# Patient Record
Sex: Female | Born: 1950 | Race: White | Hispanic: No | Marital: Single | Smoking: Never smoker
Health system: Southern US, Community
[De-identification: ages and names within clinical notes are randomized; demographics above are authoritative.]

## PROBLEM LIST (undated history)

## (undated) DIAGNOSIS — L930 Discoid lupus erythematosus: Secondary | ICD-10-CM

## (undated) DIAGNOSIS — F71 Moderate intellectual disabilities: Secondary | ICD-10-CM

## (undated) DIAGNOSIS — R918 Other nonspecific abnormal finding of lung field: Secondary | ICD-10-CM

## (undated) DIAGNOSIS — E669 Obesity, unspecified: Secondary | ICD-10-CM

## (undated) DIAGNOSIS — E785 Hyperlipidemia, unspecified: Secondary | ICD-10-CM

## (undated) DIAGNOSIS — I10 Essential (primary) hypertension: Secondary | ICD-10-CM

## (undated) DIAGNOSIS — D729 Disorder of white blood cells, unspecified: Secondary | ICD-10-CM

## (undated) DIAGNOSIS — R569 Unspecified convulsions: Secondary | ICD-10-CM

## (undated) DIAGNOSIS — Z780 Asymptomatic menopausal state: Secondary | ICD-10-CM

## (undated) HISTORY — DX: Essential (primary) hypertension: I10

## (undated) HISTORY — DX: Unspecified convulsions: R56.9

## (undated) HISTORY — DX: Discoid lupus erythematosus: L93.0

## (undated) HISTORY — DX: Disorder of white blood cells, unspecified: D72.9

## (undated) HISTORY — DX: Asymptomatic menopausal state: Z78.0

## (undated) HISTORY — DX: Other nonspecific abnormal finding of lung field: R91.8

## (undated) HISTORY — DX: Obesity, unspecified: E66.9

## (undated) HISTORY — DX: Hyperlipidemia, unspecified: E78.5

## (undated) HISTORY — DX: Moderate intellectual disabilities: F71

## (undated) HISTORY — PX: VESICOVAGINAL FISTULA CLOSURE W/ TAH: SUR271

---

## 2000-09-10 ENCOUNTER — Encounter: Payer: Self-pay | Admitting: Family Medicine

## 2000-09-10 ENCOUNTER — Encounter: Admission: RE | Admit: 2000-09-10 | Discharge: 2000-09-10 | Payer: Self-pay | Admitting: Family Medicine

## 2001-08-19 ENCOUNTER — Emergency Department (HOSPITAL_COMMUNITY): Admission: EM | Admit: 2001-08-19 | Discharge: 2001-08-19 | Payer: Self-pay | Admitting: Emergency Medicine

## 2001-09-16 ENCOUNTER — Encounter: Admission: RE | Admit: 2001-09-16 | Discharge: 2001-09-16 | Payer: Self-pay | Admitting: Family Medicine

## 2001-09-16 ENCOUNTER — Encounter: Payer: Self-pay | Admitting: Family Medicine

## 2002-09-22 ENCOUNTER — Encounter: Admission: RE | Admit: 2002-09-22 | Discharge: 2002-09-22 | Payer: Self-pay | Admitting: Family Medicine

## 2002-09-22 ENCOUNTER — Encounter: Payer: Self-pay | Admitting: Family Medicine

## 2003-10-12 ENCOUNTER — Encounter: Admission: RE | Admit: 2003-10-12 | Discharge: 2003-10-12 | Payer: Self-pay | Admitting: Family Medicine

## 2004-07-08 ENCOUNTER — Emergency Department (HOSPITAL_COMMUNITY): Admission: EM | Admit: 2004-07-08 | Discharge: 2004-07-08 | Payer: Self-pay | Admitting: Emergency Medicine

## 2004-11-11 ENCOUNTER — Encounter: Admission: RE | Admit: 2004-11-11 | Discharge: 2004-11-11 | Payer: Self-pay | Admitting: Family Medicine

## 2005-06-28 ENCOUNTER — Ambulatory Visit (HOSPITAL_COMMUNITY): Admission: RE | Admit: 2005-06-28 | Discharge: 2005-06-28 | Payer: Self-pay | Admitting: Neurology

## 2005-12-06 ENCOUNTER — Encounter: Admission: RE | Admit: 2005-12-06 | Discharge: 2005-12-06 | Payer: Self-pay | Admitting: Family Medicine

## 2006-12-21 ENCOUNTER — Encounter: Admission: RE | Admit: 2006-12-21 | Discharge: 2006-12-21 | Payer: Self-pay | Admitting: Family Medicine

## 2007-03-08 ENCOUNTER — Ambulatory Visit (HOSPITAL_COMMUNITY): Admission: RE | Admit: 2007-03-08 | Discharge: 2007-03-08 | Payer: Self-pay | Admitting: Neurology

## 2007-11-09 ENCOUNTER — Emergency Department (HOSPITAL_COMMUNITY): Admission: EM | Admit: 2007-11-09 | Discharge: 2007-11-09 | Payer: Self-pay | Admitting: Family Medicine

## 2008-01-01 ENCOUNTER — Encounter: Admission: RE | Admit: 2008-01-01 | Discharge: 2008-01-01 | Payer: Self-pay | Admitting: Family Medicine

## 2008-03-18 ENCOUNTER — Encounter: Payer: Self-pay | Admitting: Internal Medicine

## 2008-05-13 ENCOUNTER — Encounter: Payer: Self-pay | Admitting: Internal Medicine

## 2008-05-22 ENCOUNTER — Ambulatory Visit (HOSPITAL_COMMUNITY): Admission: RE | Admit: 2008-05-22 | Discharge: 2008-05-22 | Payer: Self-pay | Admitting: Neurology

## 2008-06-11 ENCOUNTER — Encounter: Payer: Self-pay | Admitting: Internal Medicine

## 2008-07-17 ENCOUNTER — Ambulatory Visit: Payer: Self-pay | Admitting: Internal Medicine

## 2008-07-17 DIAGNOSIS — R05 Cough: Secondary | ICD-10-CM

## 2008-07-17 DIAGNOSIS — R599 Enlarged lymph nodes, unspecified: Secondary | ICD-10-CM | POA: Insufficient documentation

## 2008-07-17 DIAGNOSIS — J309 Allergic rhinitis, unspecified: Secondary | ICD-10-CM

## 2008-07-17 DIAGNOSIS — J984 Other disorders of lung: Secondary | ICD-10-CM | POA: Insufficient documentation

## 2008-07-21 ENCOUNTER — Telehealth (INDEPENDENT_AMBULATORY_CARE_PROVIDER_SITE_OTHER): Payer: Self-pay | Admitting: *Deleted

## 2008-07-22 ENCOUNTER — Telehealth: Payer: Self-pay | Admitting: Internal Medicine

## 2008-07-30 ENCOUNTER — Telehealth: Payer: Self-pay | Admitting: Internal Medicine

## 2008-07-31 ENCOUNTER — Encounter: Payer: Self-pay | Admitting: Internal Medicine

## 2008-08-21 ENCOUNTER — Ambulatory Visit: Payer: Self-pay | Admitting: Internal Medicine

## 2008-08-24 ENCOUNTER — Telehealth (INDEPENDENT_AMBULATORY_CARE_PROVIDER_SITE_OTHER): Payer: Self-pay

## 2008-09-04 ENCOUNTER — Encounter: Payer: Self-pay | Admitting: Internal Medicine

## 2008-09-24 ENCOUNTER — Ambulatory Visit: Payer: Self-pay | Admitting: Critical Care Medicine

## 2008-12-21 ENCOUNTER — Ambulatory Visit: Payer: Self-pay | Admitting: Internal Medicine

## 2008-12-21 LAB — CONVERTED CEMR LAB
BUN: 7 mg/dL (ref 6–23)
Chloride: 96 meq/L (ref 96–112)
Creatinine, Ser: 0.5 mg/dL (ref 0.4–1.2)
GFR calc non Af Amer: 134 mL/min
Glucose, Bld: 90 mg/dL (ref 70–99)
Potassium: 4.3 meq/L (ref 3.5–5.1)
Sodium: 130 meq/L — ABNORMAL LOW (ref 135–145)

## 2008-12-24 ENCOUNTER — Encounter: Payer: Self-pay | Admitting: Internal Medicine

## 2009-01-12 ENCOUNTER — Encounter: Admission: RE | Admit: 2009-01-12 | Discharge: 2009-01-12 | Payer: Self-pay | Admitting: Family Medicine

## 2009-01-13 ENCOUNTER — Encounter: Payer: Self-pay | Admitting: Internal Medicine

## 2009-02-03 ENCOUNTER — Ambulatory Visit: Payer: Self-pay | Admitting: Internal Medicine

## 2009-02-05 ENCOUNTER — Telehealth: Payer: Self-pay | Admitting: Internal Medicine

## 2009-02-05 ENCOUNTER — Ambulatory Visit: Payer: Self-pay | Admitting: Internal Medicine

## 2009-02-12 ENCOUNTER — Ambulatory Visit (HOSPITAL_COMMUNITY): Admission: RE | Admit: 2009-02-12 | Discharge: 2009-02-12 | Payer: Self-pay | Admitting: Internal Medicine

## 2009-02-15 ENCOUNTER — Telehealth: Payer: Self-pay | Admitting: Internal Medicine

## 2009-03-19 ENCOUNTER — Encounter: Payer: Self-pay | Admitting: Internal Medicine

## 2009-05-21 ENCOUNTER — Ambulatory Visit: Payer: Self-pay | Admitting: Internal Medicine

## 2009-05-21 LAB — CONVERTED CEMR LAB
Calcium: 8.7 mg/dL (ref 8.4–10.5)
Chloride: 96 meq/L (ref 96–112)
Creatinine, Ser: 0.5 mg/dL (ref 0.4–1.2)
GFR calc non Af Amer: 133.93 mL/min (ref >60)
Glucose, Bld: 90 mg/dL (ref 70–99)
Sodium: 131 meq/L — ABNORMAL LOW (ref 135–145)

## 2009-05-26 ENCOUNTER — Ambulatory Visit: Payer: Self-pay | Admitting: Internal Medicine

## 2009-05-27 ENCOUNTER — Encounter: Payer: Self-pay | Admitting: Internal Medicine

## 2009-06-02 ENCOUNTER — Encounter: Payer: Self-pay | Admitting: Internal Medicine

## 2009-06-02 ENCOUNTER — Telehealth (INDEPENDENT_AMBULATORY_CARE_PROVIDER_SITE_OTHER): Payer: Self-pay | Admitting: *Deleted

## 2009-06-14 ENCOUNTER — Telehealth (INDEPENDENT_AMBULATORY_CARE_PROVIDER_SITE_OTHER): Payer: Self-pay | Admitting: *Deleted

## 2009-08-10 ENCOUNTER — Telehealth (INDEPENDENT_AMBULATORY_CARE_PROVIDER_SITE_OTHER): Payer: Self-pay | Admitting: *Deleted

## 2009-10-06 ENCOUNTER — Encounter: Payer: Self-pay | Admitting: Internal Medicine

## 2009-11-17 ENCOUNTER — Ambulatory Visit: Payer: Self-pay | Admitting: Cardiology

## 2009-11-19 ENCOUNTER — Ambulatory Visit: Payer: Self-pay | Admitting: Internal Medicine

## 2009-11-23 ENCOUNTER — Telehealth: Payer: Self-pay | Admitting: Internal Medicine

## 2010-01-04 ENCOUNTER — Ambulatory Visit: Payer: Self-pay | Admitting: Internal Medicine

## 2010-01-04 DIAGNOSIS — L659 Nonscarring hair loss, unspecified: Secondary | ICD-10-CM | POA: Insufficient documentation

## 2010-01-06 ENCOUNTER — Telehealth (INDEPENDENT_AMBULATORY_CARE_PROVIDER_SITE_OTHER): Payer: Self-pay | Admitting: *Deleted

## 2010-01-07 LAB — CONVERTED CEMR LAB
T3, Free: 2.3 pg/mL (ref 2.3–4.2)
T4, Total: 6 ug/dL (ref 5.0–12.5)
TSH: 3.6 microintl units/mL (ref 0.35–5.50)

## 2010-01-18 ENCOUNTER — Encounter: Admission: RE | Admit: 2010-01-18 | Discharge: 2010-01-18 | Payer: Self-pay | Admitting: Family Medicine

## 2010-01-19 ENCOUNTER — Encounter: Admission: RE | Admit: 2010-01-19 | Discharge: 2010-04-19 | Payer: Self-pay | Admitting: Internal Medicine

## 2010-01-28 ENCOUNTER — Encounter: Admission: RE | Admit: 2010-01-28 | Discharge: 2010-01-28 | Payer: Self-pay | Admitting: Family Medicine

## 2010-02-24 ENCOUNTER — Encounter: Payer: Self-pay | Admitting: Internal Medicine

## 2010-06-27 ENCOUNTER — Ambulatory Visit: Payer: Self-pay | Admitting: Internal Medicine

## 2010-06-27 LAB — CONVERTED CEMR LAB
CO2: 27 meq/L (ref 19–32)
Chloride: 92 meq/L — ABNORMAL LOW (ref 96–112)
GFR calc non Af Amer: 150.69 mL/min (ref >60)

## 2010-07-01 ENCOUNTER — Telehealth (INDEPENDENT_AMBULATORY_CARE_PROVIDER_SITE_OTHER): Payer: Self-pay | Admitting: *Deleted

## 2010-07-05 ENCOUNTER — Encounter: Payer: Self-pay | Admitting: Internal Medicine

## 2010-07-06 ENCOUNTER — Telehealth: Payer: Self-pay | Admitting: Internal Medicine

## 2010-07-06 ENCOUNTER — Ambulatory Visit: Payer: Self-pay | Admitting: Cardiovascular Disease

## 2010-07-13 ENCOUNTER — Ambulatory Visit: Payer: Self-pay | Admitting: Internal Medicine

## 2010-08-17 ENCOUNTER — Ambulatory Visit: Payer: Self-pay | Admitting: Internal Medicine

## 2010-11-02 ENCOUNTER — Encounter (INDEPENDENT_AMBULATORY_CARE_PROVIDER_SITE_OTHER): Payer: Self-pay | Admitting: *Deleted

## 2010-11-18 ENCOUNTER — Ambulatory Visit: Payer: Self-pay | Admitting: Internal Medicine

## 2010-12-22 ENCOUNTER — Encounter: Payer: Self-pay | Admitting: Internal Medicine

## 2010-12-24 ENCOUNTER — Other Ambulatory Visit: Payer: Self-pay | Admitting: Family Medicine

## 2010-12-24 DIAGNOSIS — Z1239 Encounter for other screening for malignant neoplasm of breast: Secondary | ICD-10-CM

## 2010-12-26 ENCOUNTER — Encounter: Payer: Self-pay | Admitting: Internal Medicine

## 2011-01-03 NOTE — Progress Notes (Signed)
Summary: RETURNED CALL  Phone Note Call from Patient   Caller: PT'S SISTER CYBIL BRAY Call For: [facility] Summary of Call: RETURNED CALL RE: PT. CALL CYBIL BRAY [phone]. Initial call taken by: Tivis Ringer, CNA,  July 01, 2010 12:34 PM  Follow-up for Phone Call        Spoke with pt's sister Cybil.  I advised that per MR- pt's sodium is low.  She states that pt is doing fine and having no current issues.  I advised that we have already let Dr Rosezetta Schlatter know about this issue and she should followup there.  Sister verbalized understanding.   Follow-up by: Vernie Murders,  July 01, 2010 2:30 PM

## 2011-01-03 NOTE — Miscellaneous (Signed)
Summary: Discharged/[place]  Discharged/[place]   Imported By: Lester Springbrook 06/14/2010 09:52:44  _____________________________________________________________________  External Attachment:    Type:   Image     Comment:   External Document

## 2011-01-03 NOTE — Letter (Signed)
Summary: Physicians Recommendations/[facility]  Physicians Recommendations/[facility]   Imported By: Lennie Odor 08/19/2010 14:16:23  _____________________________________________________________________  External Attachment:    Type:   Image     Comment:   External Document

## 2011-01-03 NOTE — Assessment & Plan Note (Signed)
Summary: 4-5 weeks/apc   Visit Type:  Follow-up Copy to:  Dr. Stevphen Rochester Primary Provider/Referring Provider:  Dr. Fuller Mandril  CC:  Pt here for follow-up. Pt states neurotin has helped cough alot. Marland Kitchen  History of Present Illness: :  Followup Chronic Cough (since 2007)   OV 07/13/2010: Presents with careatker CArla (she is one on one iwht patient for years) . Mom not present today. Last visit in FEb 2011. AT that time they other caretaker [name] and mom reported cough was 90% better on cylical cough protocol and coming off alendronate. TOday, other caretaker [name] tells me that cyclical cough protocol never helped. Cough is unchanged and persistent. She describes it as intense episodes that come on at random and lasts 90 seconds. It is dry. No clear cut precipitating or aggravating factors.  No cough at night. Still with alopecia. REC: NEURONTIN FOR CYCLICAL COUGH   OV August 17, 2010: tAking neurontin 100mg  by mouth three times a day . Cough now has improved over 70% according to caretaker. Caretaker also ffeels behavior is better. No side effects from neurontin. Patient also agrees cough is tremondously better. Denies associated nausea, vomit, diarrhea, fever, chills, edema, dyspnea, wheeze, sinus drainage, gerd     Preventive Screening-Counseling & Management  Alcohol-Tobacco     Smoking Status: never     Passive Smoke Exposure: no  Current Medications (verified): 1)  Tegretol Xr 400 Mg  Xr12h-Tab (Carbamazepine) .Marland Kitchen.. 1 in The Morning and 2 At Bedtime 2)  Cerovite Advanced Formula   Tabs (Multiple Vitamins-Minerals) .... Once Daily 3)  Benefiber   Chew (Wheat Dextrin) .... Two Times A Day 4)  Keppra 500 Mg  Tabs (Levetiracetam) .... Two Times A Day 5)  Buspirone Hcl 15 Mg  Tabs (Buspirone Hcl) .... Take 1 Tab Two Times A Day 6)  Calcium Carbonate 600 Mg Tabs (Calcium Carbonate) .... Three Times A Day 7)  Vicks Vapor Rub .... As Directed 8)  Cvs Ibuprofen 200 Mg  Caps  (Ibuprofen) .... 2 Tabs Every 6 Hours 9)  Dexilant 60 Mg Cpdr (Dexlansoprazole) .... Once Daily 10)  Vitamin D 400 Iu .Marland Kitchen.. 1 Tab 3 Times A Day 11)  Selenium 200 Mcg Tabs (Selenium) .... Take 1 Tablet By Mouth Once A Day 12)  Elidel 1 % Crea (Pimecrolimus) .... As Directed 13)  Benzonatate 100 Mg Caps (Benzonatate) .... Take 1 Tablet By Mouth Three Times A Day As Needed 14)  Neurontin 100 Mg Caps (Gabapentin) .... Take 1 Capsule By Mouth Three Times A Day  Allergies (verified): No Known Drug Allergies  Past History:  Past medical, surgical, family and social histories (including risk factors) reviewed, and no changes noted (except as noted below).  Past Medical History: Allergic Rhinitis Obesity Menopause Denies MI, COPD, Asthma, Hyperlipidemia, Hypertension.  Moderate Mental Retardation Seizures - focal - being followed by Dr. Sandria Manly (shuld not take anti-histamines) Discoid lupus  Alopecia Low chronic WBC coung (2-3K) Hyperlipidemia Osteoporosis.  Pulmonary Nodules  - 6mm 3/.33/2010. No change CT 07/06/2010. No further followup Mediastinal node  - stable since 2010 through 07/2010. NO furthre followuo  Past Surgical History: Reviewed history from 07/17/2008 and no changes required. hysterectomy  Family History: Reviewed history from 07/17/2008 and no changes required. cancer-breast -mother  Social History: Reviewed history from 07/13/2010 and no changes required. 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   Review of Systems  The patient denies shortness of  breath with activity, shortness of breath at rest, productive cough, non-productive cough, coughing up blood, chest pain, irregular heartbeats, acid heartburn, indigestion, loss of appetite, weight change, abdominal pain, difficulty swallowing, sore throat, tooth/dental problems, headaches, nasal congestion/difficulty breathing through nose, sneezing,  itching, ear ache, anxiety, depression, hand/feet swelling, joint stiffness or pain, rash, change in color of mucus, and fever.    Vital Signs:  Patient profile:   60 year old female Height:      65 inches Weight:      181 pounds BMI:     30.23 O2 Sat:      97 % on Room air Temp:     97.5 degrees F oral Pulse rate:   81 / minute BP sitting:   110 / 72  (right arm) Cuff size:   regular  Vitals Entered By: Carron Curie CMA (August 17, 2010 1:41 PM)  O2 Flow:  Room air CC: Pt here for follow-up. Pt states neurotin has helped cough alot.  Comments Medications reviewed with patient Carron Curie CMA  August 17, 2010 1:45 PM Daytime phone number verified with patient.    Physical Exam  General:  oveerweight alopecia + Head:  normocephalic and atraumatic Eyes:  PERRLA/EOM intact; conjunctiva and sclera clear Ears:  TMs intact and clear with normal canals Nose:  no deformity, discharge, inflammation, or lesions Mouth:  no deformity or lesionsMelampatti Class II.   Neck:  no masses, thyromegaly, or abnormal cervical nodes Chest Wall:  no deformities noted Lungs:  clear bilaterally to auscultation and percussion Heart:  regular rate and rhythm, S1, S2 without murmurs, rubs, gallops, or clicks Abdomen:  bowel sounds positive; abdomen soft and non-tender without masses, or organomegaly Msk:  no deformity or scoliosis noted with normal posture Pulses:  pulses normal Extremities:  no clubbing, cyanosis, edema, or deformity noted Neurologic:  CN II-XII grossly intact with normal reflexes, coordination, muscle strength and tone Skin:  intact without lesions or rashes Cervical Nodes:  no significant adenopathy Axillary Nodes:  no significant adenopathy Psych:  alert and cooperative; normal mood and affect; normal attention span and concentration   Impression & Recommendations:  Problem # 1:  COUGH (ICD-786.2) Assessment Improved  Orders: Speech Therapy (Speech  Therapy)  Tough situation. Cough is likely multifactorial. THere is hx of sinus drainage but she has failed nasal inhlaer Rx. They wont try antihistamine or sudafed to hx of seizures. She is currently on GERD Rx and GI followup but this has not helped. Although she continues to take ALENDRONATE which could be contributing to GERD. HAs failed asthma Rx with symbicort/singulair in past. No evidence of fibrosis on CT chest (multiple). There are lot of features to suggest HABIT COUGH v VCD  Followup 01/04/2010: cough had improved with cyclical cough protocol, stopping alendronate and tessalon perles. This suggests signfiicant VCD  Follouwp 07/13/2010: cough unimproved. START NEURONTIN (okayed by Dr. Sandria Manly)  Followup 08/17/2010: Cough improved 70% with neurontin 100mg  by mouth three times a day   PLAN Increase neurontin gradually to 300mg  by mouth three times a day (tolerateing 100mg  three times a day well so far) FU 3 months  Orders: Est. Patient Level II (16109)  Medications Added to Medication List This Visit: 1)  Neurontin 100 Mg Caps (Gabapentin) .... Take 1 capsule by mouth three times a day 2)  Neurontin 300 Mg Caps (Gabapentin) .... Take 1 cap two times a day x 3 days, then 1 cap three times a day to continue  Patient Instructions:  1)  increase your neurontin as directed  2)  have flu shot today 3)  return to see me in 3-4 months 4)  come sooner if you are feeling bad or having side effects from neurontin Prescriptions: NEURONTIN 300 MG CAPS (GABAPENTIN) take 1 cap two times a day x 3 days, then 1 cap three times a day to continue  #90 x 6   Entered and Authorized by:   Kalman Shan MD   Signed by:   Kalman Shan MD on 08/17/2010   Method used:   Print then Give to Patient   RxID:   [id number]    Immunization History:  Pneumovax Immunization History:    Pneumovax:  pneumovax (06/03/2010)   Appended Document: 4-5 weeks/apc    Clinical Lists  Changes  Orders: Added new Service order of Influenza Vaccine MCR [id number]) - Signed Observations: Added new observation of FLU VAXLOT: [id number] (08/17/2010 14:10) Added new observation of FLU VAX EXP: 06/03/2011 (08/17/2010 14:10) Added new observation of FLU VAXBY: Michel Bickers CMA (08/17/2010 14:10) Added new observation of FLU VAXRTE: IM (08/17/2010 14:10) Added new observation of FLU VAX DSE: 0.5 ml (08/17/2010 14:10) Added new observation of FLU VAXMFR: GlaxoSmithKline (08/17/2010 14:10) Added new observation of FLU VAX SITE: right deltoid (08/17/2010 14:10) Added new observation of FLU VAX: Fluvax MCR (08/17/2010 14:10)       Immunizations Administered:  Influenza Vaccine # 1:    Vaccine Type: Fluvax MCR    Site: right deltoid    Mfr: GlaxoSmithKline    Dose: 0.5 ml    Route: IM    Given by: Michel Bickers CMA    Exp. Date: 06/03/2011    Lot #: [id number]  Flu Vaccine Consent Questions:    Do you have a history of severe allergic reactions to this vaccine? no    Any prior history of allergic reactions to egg and/or gelatin? no    Do you have a sensitivity to the preservative Thimersol? no    Do you have a past history of Guillan-Barre Syndrome? no    Do you currently have an acute febrile illness? no    Have you ever had a severe reaction to latex? no    Vaccine information given and explained to patient? yes    Are you currently pregnant? no

## 2011-01-03 NOTE — Progress Notes (Signed)
Summary: results  Phone Note Call from Patient Call back at Home Phone [phone]   Caller: Mom Call For: ramaswamy Reason for Call: Talk to Nurse, Lab or Test Results Summary of Call: want results of test done on her daughter. Initial call taken by: Eugene Gavia,  January 06, 2010 11:24 AM  Follow-up for Phone Call        Lab results given to pt's Mom. Abigail Miyamoto RN  January 06, 2010 11:44 AM

## 2011-01-03 NOTE — Progress Notes (Signed)
Summary: results  Phone Note Call from Patient   Caller: Katherine Mantle - Group home manager Call For: ramaswamy Reason for Call: Talk to Nurse Summary of Call: pt in group home.  Please send results of her blood test to home fax. Initial call taken by: Eugene Gavia,  July 06, 2010 2:50 PM  Follow-up for Phone Call        Spoke with group home manager Katherine Mantle and advised of pt's sodium levels and MR recs. Pt's PCP is Ace Gins at [facility] on [address]. Pt's mother is in the hospital. Caretaker is requesting copy of labs. Labs placed in mail and labs faxed to Dr. Larina Bras office. Zackery Barefoot CMA  July 06, 2010 3:12 PM

## 2011-01-03 NOTE — Progress Notes (Signed)
Summary: Physical Form/[facility]  Physical Form/[facility]   Imported By: Sherian Rein 07/20/2010 14:25:11  _____________________________________________________________________  External Attachment:    Type:   Image     Comment:   External Document

## 2011-01-03 NOTE — Assessment & Plan Note (Signed)
Summary: rov ///kp   Visit Type:  Follow-up Copy to:  Dr. Stevphen Rochester Primary Provider/Referring Provider:  Dr. Fuller Mandril  CC:  Pt here for follow-up appt. pt states she still has the cough. It was better during cyclical cough protocol.  Pt states she does not cough when sh eis lying flat. Pt request thyoid check..  History of Present Illness: OV 01/04/2010. Followup Chronic Cough (since 2007), Pulmonary nodules and Mediastinal Nodes.  Caretaker [name] from Group Home with her and mom. At last visit in December, we started cylical cough protocol for HABIT COUGH. They state with the protoicol, cough resolved 90% but 10 days later cough recurred but still it is only at 50% of prior severity. THey also think stopping alendronate has helped cough. Tessalon perles also helped cough. They are relieved it is better but disappointed it is not gone yet. Mom also concerned about new alopecia on scalp. Seen by derm but no relief. She is specifically requesting thyroid function testing.   Current Medications (verified): 1)  Tegretol Xr 400 Mg  Xr12h-Tab (Carbamazepine) .Marland Kitchen.. 1 in The Morning and 2 At Bedtime 2)  Cerovite Advanced Formula   Tabs (Multiple Vitamins-Minerals) .... Once Daily 3)  Benefiber   Chew (Wheat Dextrin) .... Once Daily 4)  Keppra 500 Mg  Tabs (Levetiracetam) .... Two Times A Day 5)  Buspirone Hcl 15 Mg  Tabs (Buspirone Hcl) .... Take 1 Tab Two Times A Day 6)  Calcium Carbonate 600 Mg Tabs (Calcium Carbonate) .... Three Times A Day 7)  Vicks Vapor Rub .... As Directed 8)  Cvs Ibuprofen 200 Mg  Caps (Ibuprofen) .... 2 Tabs Every 6 Hours 9)  Dexilant 60 Mg Cpdr (Dexlansoprazole) .... Once Daily 10)  Vitamin D 400 Iu .Marland Kitchen.. 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 Allergic Rhinitis Obesity Menopause Denies MI, COPD, Asthma, Hyperlipidemia, Hypertension.  Moderate Mental Retardation Seizures - focal - being followed by Dr. Sandria Manly (shuld not take anti-histamines)  Past Surgical History: Reviewed history from 07/17/2008 and no changes required. hysterectomy  Family History: Reviewed history from 07/17/2008 and no changes required. cancer-breast -mother  Social History: Reviewed history from 07/17/2008 and no changes required. 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  The patient denies anorexia, fever, weight loss, weight gain, vision loss, decreased hearing, hoarseness, chest pain, syncope, dyspnea on exertion, peripheral edema, headaches, hemoptysis, abdominal pain, melena, hematochezia, severe indigestion/heartburn, hematuria, incontinence, genital sores, muscle weakness, suspicious skin lesions, transient blindness, difficulty walking, depression, unusual weight change, abnormal bleeding, enlarged lymph nodes, angioedema, breast masses, and testicular masses.         alopecia  Vital Signs:  Patient profile:   60 year old female Height:      65 inches Weight:      183.13 pounds O2 Sat:      98 % on Room air Temp:     97.5 degrees F oral Pulse rate:   76 / minute BP sitting:   138 / 90  (right  arm) Cuff size:   regular  Vitals Entered By: Carron Curie CMA (January 04, 2010 4:03 PM)  O2 Flow:  Room air CC: Pt here for follow-up appt. pt states she still has the cough. It was better during cyclical cough protocol.  Pt states she does not cough when sh eis lying flat. Pt request thyoid check. Comments Medications  reviewed with patient Carron Curie CMA  January 04, 2010 4:03 PM Daytime phone number verified with patient.    Physical Exam  General:  oveerweight Head:  normocephalic and atraumatic Eyes:  PERRLA/EOM intact; conjunctiva and sclera clear Ears:  TMs intact and clear with normal canals Nose:  no deformity, discharge, inflammation, or lesions Mouth:  no deformity or lesionsMelampatti Class II.   Neck:  no masses, thyromegaly, or abnormal cervical nodes Chest Wall:  no deformities noted Lungs:  clear bilaterally to auscultation and percussion Heart:  regular rate and rhythm, S1, S2 without murmurs, rubs, gallops, or clicks Abdomen:  bowel sounds positive; abdomen soft and non-tender without masses, or organomegaly Msk:  no deformity or scoliosis noted with normal posture Pulses:  pulses normal Extremities:  no clubbing, cyanosis, edema, or deformity noted Neurologic:  CN II-XII grossly intact with normal reflexes, coordination, muscle strength and tone Skin:  intact without lesions or rashes Cervical Nodes:  no significant adenopathy Axillary Nodes:  no significant adenopathy Psych:  alert and cooperative; normal mood and affect; normal attention span and concentration   Impression & Recommendations:  Problem # 1:  ALOPECIA (ICD-704.00) Assessment New per mom;s request check thyroid function test Orders: TLB-T4 (Thyrox), Total (84436-T4) TLB-T3, Free (Triiodothyronine) (84481-T3FREE) TLB-TSH (Thyroid Stimulating Hormone) (84443-TSH) Est. Patient Level III (29528)  Problem # 2:  ENLARGEMENT OF LYMPH NODES (ICD-785.6) Assessment: Comment Only  Improved v stable. Difficult to say in non contrast ct chest  plan repeat ct chest with contrast Fall 2011 Orders: Radiology Referral (Radiology)  Problem # 3:  PULMONARY NODULE (ICD-518.89) Assessment: Comment Only  Orders: Radiology Referral (Radiology) Est. Patient Level III (41324)  Orders: Radiology Referral  (Radiology)  largest nodule is 6mm on 02/03/2009.  Seems stable per report from 06/2008 [facility].  No change in CT June/July 2010 No chnage in CT 12/`04/2009  plan necxt cct in 6-9 months - august 2011 (final CT)  Problem # 4:  COUGH (ICD-786.2) Assessment: Improved  Orders: Speech Therapy (Speech Therapy) Est. Patient Level III (40102)  Tough situation. Cough is likely multifactorial. THere is hx of sinus drainage but she has failed nasal inhlaer Rx. They wont try antihistamine or sudafed to hx of seizures. She is currently on GERD Rx and GI followup but this has not helped. Although she continues to take ALENDRONATE which could be contributing to GERD. HAs failed asthma Rx with symbicort/singulair in past. No evidence of fibrosis on CT chest (multiple). There are lot of features to suggest HABIT COUGH v VCD  Followup 01/04/2010: cough had improved with cyclical cough protocol, stopping alendronate and tessalon perles. This suggests signfiicant VCD  PLAN REFER TO SPEECH THERAPY INSTRUCTED TO SWALLOW OR DRINK WATER when she wants to cough CONTINUE PPI ROV 6 months Depending on performance to above, will consider diagnostic lary with triggers Vs 2nd opinoon from [facility] referral  Medications Added to Medication List This Visit: 1)  Tessalon Perles 100 Mg Caps (Benzonatate) .... One to  mouth 3 times daily as needed  Patient Instructions: 1)  please attend speech therapy 2)  take teassalon cough perles  for cough 3)  continue your other medicines 4)  change your ct scan date to august 2011 5)  return to see me after ct chest Prescriptions: TESSALON PERLES 100 MG  CAPS (BENZONATATE) One to  mouth 3 times daily as needed  #90 x 6   Entered and Authorized by:   Kalman Shan MD   Signed by:   Kalman Shan MD on 01/04/2010   Method used:   Print then Give to Patient   RxID:   [id number]

## 2011-01-03 NOTE — Letter (Signed)
Summary: [facility] Results Follow Up Letter  [facility]  [address]   [place], [place] [address]   Phone: [phone]  Fax: [phone]    07/05/2010 MRN: [id number]  [facility] [address] [place], [place]  [address]  Dear Ms. Rosiak,  We have received the results from your recent tests and have been unable to contact you.  Please call our office at [phone] so that Dr. Marchelle Gearing or his nurse may review the results with you.    Thank you,  Nature conservation officer Pulmonary Division

## 2011-01-03 NOTE — Letter (Signed)
Summary: Physicians Recommendations/[facility]  Physicians Recommendations/[facility]   Imported By: Sherian Rein 01/11/2010 11:24:36  _____________________________________________________________________  External Attachment:    Type:   Image     Comment:   External Document

## 2011-01-03 NOTE — Assessment & Plan Note (Signed)
Summary: follow up ct chest/cb   Visit Type:  Follow-up Copy to:  Dr. Stevphen Rochester Primary Nicole [name]/Referring Veer Elamin:  Dr. Fuller Mandril  CC:  Pt here for follow up.  History of Present Illness: :  Followup Chronic Cough (since 2007), Pulmonary nodules and Mediastinal Nodes.  Caretaker Nicole [name] from  OV 07/13/2010: Presents with careatker Nicole [name] (she is one on one iwht patient for years) . Mom not present today. Last visit in FEb 2011. AT that time they other caretaker Nicole [name] and mom reported cough was 90% better on cylical cough protocol and coming off alendronate. TOday, other caretaker Nicole [name] tells me that cyclical cough protocol never helped. Cough is unchanged and persistent. She describes it as intense episodes that come on at random and lasts 90 seconds. It is dry. No clear cut precipitating or aggravating factors.  No cough at night. Still with alopecia.     Preventive Screening-Counseling & Management  Alcohol-Tobacco     Smoking Status: never  Current Medications (verified): 1)  Tegretol Xr 400 Mg  Xr12h-Tab (Carbamazepine) .Marland Kitchen.. 1 in The Morning and 2 At Bedtime 2)  Cerovite Advanced Formula   Tabs (Multiple Vitamins-Minerals) .... Once Daily 3)  Benefiber   Chew (Wheat Dextrin) .... Two Times A Day 4)  Keppra 500 Mg  Tabs (Levetiracetam) .... Two Times A Day 5)  Buspirone Hcl 15 Mg  Tabs (Buspirone Hcl) .... Take 1 Tab Two Times A Day 6)  Calcium Carbonate 600 Mg Tabs (Calcium Carbonate) .... Three Times A Day 7)  Vicks Vapor Rub .... As Directed 8)  Cvs Ibuprofen 200 Mg  Caps (Ibuprofen) .... 2 Tabs Every 6 Hours 9)  Dexilant 60 Mg Cpdr (Dexlansoprazole) .... Once Daily 10)  Vitamin D 400 Iu .Marland Kitchen.. 1 Tab 3 Times A Day 11)  Selenium 200 Mcg Tabs (Selenium) .... Take 1 Tablet By Mouth Once A Day 12)  Elidel 1 % Crea (Pimecrolimus) .... As Directed 13)  Benzonatate 100 Mg Caps (Benzonatate) .... 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   Risk Factors: Smoking Status: never (07/13/2010) Passive Smoke Exposure: no (07/17/2008)  Past Medical History: Allergic Rhinitis Obesity Menopause Denies MI, COPD, Asthma, Hyperlipidemia, Hypertension.  Moderate Mental Retardation Seizures - focal - being followed by Dr. Sandria Manly (shuld not take anti-histamines) Discoid lupus  Alopecia Low chronic WBC coung (2-3K) Hyperlipidemia Osteoporosis.   Past Surgical History: Reviewed history from 07/17/2008 and no changes required. hysterectomy  Family History: Reviewed history from 07/17/2008 and no changes required. cancer-breast -mother  Social History: Reviewed history from 07/17/2008 and no changes required. 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   Review of Systems  The patient denies anorexia, fever, weight loss, weight gain, vision loss, decreased hearing, hoarseness, chest pain, syncope, dyspnea on exertion, peripheral edema, prolonged cough, headaches, hemoptysis, abdominal pain, melena, hematochezia, severe indigestion/heartburn, hematuria, incontinence, genital sores, muscle weakness, suspicious skin lesions, transient blindness, difficulty walking, depression, unusual weight change, abnormal bleeding, enlarged lymph nodes, angioedema, breast masses, and testicular masses.         alopecia chronic cough  Vital Signs:  Patient profile:   60 year old female Height:  65 inches Weight:      180.25 pounds BMI:     30.10 O2 Sat:      98 % on Room air Temp:     98.2 degrees F oral Pulse rate:   76 / minute BP sitting:   124 / 80  (right arm) Cuff size:   regular  Vitals Entered By:  Zackery Barefoot CMA (July 13, 2010 1:44 PM)  O2 Flow:  Room air CC: Pt here for follow up Comments Medications reviewed with patient Verified contact number and pharmacy with patient Zackery Barefoot CMA  July 13, 2010 1:45 PM    Physical Exam  General:  oveerweight alopecia + Head:  normocephalic and atraumatic Eyes:  PERRLA/EOM intact; conjunctiva and sclera clear Ears:  TMs intact and clear with normal canals Nose:  no deformity, discharge, inflammation, or lesions Mouth:  no deformity or lesionsMelampatti Class II.   Neck:  no masses, thyromegaly, or abnormal cervical nodes Chest Wall:  no deformities noted Lungs:  clear bilaterally to auscultation and percussion Heart:  regular rate and rhythm, S1, S2 without murmurs, rubs, gallops, or clicks Abdomen:  bowel sounds positive; abdomen soft and non-tender without masses, or organomegaly Msk:  no deformity or scoliosis noted with normal posture Pulses:  pulses normal Extremities:  no clubbing, cyanosis, edema, or deformity noted Neurologic:  CN II-XII grossly intact with normal reflexes, coordination, muscle strength and tone Skin:  intact without lesions or rashes Cervical Nodes:  no significant adenopathy Axillary Nodes:  no significant adenopathy Psych:  alert and cooperative; normal mood and affect; normal attention span and concentration   CT of Chest  Procedure date:  07/06/2010  Findings:      no change in nodules no enlarged mediastinal nodes  Comments:      independently reviewed image  Impression & Recommendations:  Problem # 1:  COUGH (ICD-786.2) Assessment Unchanged  Orders: Speech Therapy (Speech Therapy)  Tough situation. Cough is likely multifactorial. THere is hx of sinus drainage but she has failed nasal inhlaer Rx. They wont try antihistamine or sudafed to hx of seizures. She is currently on GERD Rx and GI followup but this has not helped. Although she continues to take ALENDRONATE which  could be contributing to GERD. HAs failed asthma Rx with symbicort/singulair in past. No evidence of fibrosis on CT chest (multiple). There are lot of features to suggest HABIT COUGH v VCD  Followup 01/04/2010: cough had improved with cyclical cough protocol, stopping alendronate and tessalon perles. This suggests signfiicant VCD  Follouwp 07/13/2010: cough unimproved. Not following instructions about swallowing or drinking water.   PLAN Start neurontin for cylical cough (ok per Dr. Sandria Manly on phone conversation REturn in 8 weeks for fu  Orders: Prescription Created Electronically [phone])  Problem # 2:  PULMONARY NODULE (ICD-518.89) Assessment: Unchanged  largest nodule is 6mm on 02/03/2009.  Seems stable per report from 06/2008 [facility].  No change in CT June/July 2010 No chnage in CT 12/`04/2009 Ni change in CT chest 07/06/2010  plan no further CT chest  Orders: Prescription Created Electronically [phone])  Problem # 3:  ENLARGEMENT OF LYMPH NODES (ICD-785.6) Assessment: Unchanged  Improved v stable. Difficult to say in non contrast ct chest. Fall 8/3/211 Ct chest - no mention of nodes  plan no furhte rfollowupo  Orders: Prescription Created Electronically [phone])  Medications Added to Medication List This Visit: 1)  Benefiber Chew (Wheat dextrin) .... Two times a day 2)  Selenium 200 Mcg Tabs (Selenium) .Marland KitchenMarland KitchenMarland Kitchen  Take 1 tablet by mouth once a day 3)  Elidel 1 % Crea (Pimecrolimus) .... As directed 4)  Benzonatate 100 Mg Caps (Benzonatate) .... Take 1 tablet by mouth three times a day as needed 5)  Neurontin 100 Mg Caps (Gabapentin) .... Take 1 cap tid  Other Orders: Est. Patient Level III ([address])  Patient Instructions: 1)  please take neurontin 100mg  by mouth three times a day  2)  return in 4-5 weeks with followup 3)  if it makes you too drowsy give [place] a call 4)  let me know how the cough responds to it 5)  your lung nodules are stable and no more scans  needed Prescriptions: NEURONTIN 100 MG CAPS (GABAPENTIN) take 1 cap tid  #90 x 1   Entered and Authorized by:   Kalman Shan MD   Signed by:   Kalman Shan MD on 07/13/2010   Method used:   Print then Give to Patient   RxID:   [id number]

## 2011-01-03 NOTE — Letter (Signed)
SummaryScience writer Pulmonary Care Appointment Letter  [facility]  [address]   [place], [place] [address]   Phone: [phone]  Fax: [phone]    11/02/2010 MRN: [id number]  [name] [place] [address] [place], [place]  [address]  Dear Nicole [name],   Our office is attempting to contact you about an appointment.  Please call our office at [phone] to RE-schedule this appointment with [facility]. He will not be in our office on 12/15. Your appointment has been cancelled.   Our registration staff is prepared to assist you with any questions you may have.    Thank you,   Nature conservation officer Pulmonary Division

## 2011-01-05 NOTE — Assessment & Plan Note (Signed)
Summary: 3 months/ mbw   Visit Type:  Initial Consult Copy to:  Dr. Stevphen Rochester Primary Provider/Referring Provider:  Dr. Fuller Mandril  CC:  Pt here for 3 month appt. Pt states cough is improved. Pt states sh ehas an occasional dry cough. .  History of Present Illness: :  Followup Chronic Cough (since 2007)   OV 07/13/2010: Presents with careatker CArla (she is one on one iwht patient for years) . Mom not present today. Last visit in FEb 2011. AT that time they other caretaker [name] and mom reported cough was 90% better on cylical cough protocol and coming off alendronate. TOday, other caretaker [name] tells me that cyclical cough protocol never helped. Cough is unchanged and persistent. She describes it as intense episodes that come on at random and lasts 90 seconds. It is dry. No clear cut precipitating or aggravating factors.  No cough at night. Still with alopecia. REC: NEURONTIN FOR CYCLICAL COUGH   OV August 17, 2010: tAking neurontin 100mg  by mouth three times a day . Cough now has improved over 70% according to caretaker. Caretaker also ffeels behavior is better. No side effects from neurontin. Patient also agrees cough is tremondously better. Denies associated nausea, vomit, diarrhea, fever, chills, edema, dyspnea, wheeze, sinus drainage, gerd. REC: [name]   December 22, 2010: Follwup chronic cough. Now on neurontin 300mg  by mouth three times a day according to care taker who has confirmed the dose after calling the care facility. Toleratin it well. CAretaker says cough is 90% better. Patient also agrees. No new complaint. Denies associated nausea, vomit, diarrhea, fever, chills, sputum, wheeze, headaches, sleepiness, siezures, gerd. She states she want pneumovax.       Preventive Screening-Counseling & Management  Alcohol-Tobacco     Smoking Status: never     Passive Smoke Exposure: no  Current Medications (verified): 1)  Tegretol Xr 400 Mg  Xr12h-Tab  (Carbamazepine) .Marland Kitchen.. 1 in The Morning and 2 At Bedtime 2)  Cerovite Advanced Formula   Tabs (Multiple Vitamins-Minerals) .... Once Daily 3)  Benefiber   Chew (Wheat Dextrin) .... Two Times A Day 4)  Keppra 500 Mg  Tabs (Levetiracetam) .... Two Times A Day 5)  Buspirone Hcl 15 Mg  Tabs (Buspirone Hcl) .... Take 1 Tab Two Times A Day 6)  Calcium Carbonate 600 Mg Tabs (Calcium Carbonate) .... Three Times A Day 7)  Vicks Vapor Rub .... As Directed 8)  Cvs Ibuprofen 200 Mg  Caps (Ibuprofen) .... 2 Tabs Every 6 Hours 9)  Dexilant 60 Mg Cpdr (Dexlansoprazole) .... Once Daily 10)  Vitamin D 400 Iu .Marland Kitchen.. 1 Tab 3 Times A Day 11)  Selenium 200 Mcg Tabs (Selenium) .... Take 1 Tablet By Mouth Once A Day 12)  Elidel 1 % Crea (Pimecrolimus) .... As Directed 13)  Benzonatate 100 Mg Caps (Benzonatate) .... Take 1 Tablet By Mouth Three Times A Day As Needed 14)  Neurontin 100 Mg Caps (Gabapentin) .... Take 1 Capsule By Mouth Three Times A Day  Allergies (verified): No Known Drug Allergies  Past History:  Past medical, surgical, family and social histories (including risk factors) reviewed, and no changes noted (except as noted below).  Past Medical History: Reviewed history from 08/17/2010 and no changes required. Allergic Rhinitis Obesity Menopause Denies MI, COPD, Asthma, Hyperlipidemia, Hypertension.  Moderate Mental Retardation Seizures - focal - being followed by Dr. Sandria Manly (shuld not take anti-histamines) Discoid lupus  Alopecia Low chronic WBC coung (2-3K) Hyperlipidemia Osteoporosis.  Pulmonary Nodules  -  6mm 3/.33/2010. No change CT 07/06/2010. No further followup Mediastinal node  - stable since 2010 through 07/2010. NO furthre followuo  Past Surgical History: Reviewed history from 07/17/2008 and no changes required. hysterectomy  Family History: Reviewed history from 07/17/2008 and no changes required. cancer-breast -mother  Social History: Reviewed history from 07/13/2010 and no  changes required. 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   Review of Systems  The patient denies shortness of breath with activity, shortness of breath at rest, productive cough, non-productive cough, coughing up blood, chest pain, irregular heartbeats, acid heartburn, indigestion, loss of appetite, weight change, abdominal pain, difficulty swallowing, sore throat, tooth/dental problems, headaches, nasal congestion/difficulty breathing through nose, sneezing, itching, ear ache, anxiety, depression, hand/feet swelling, joint stiffness or pain, rash, change in color of mucus, and fever.    Vital Signs:  Patient profile:   60 year old female Height:      65 inches Weight:      183.50 pounds BMI:     30.65 O2 Sat:      99 % on Room air Temp:     98.2 degrees F oral Pulse rate:   82 / minute BP sitting:   132 / 90  (right arm) Cuff size:   regular  Vitals Entered By: Carron Curie CMA (December 22, 2010 1:35 PM)  O2 Flow:  Room air CC: Pt here for 3 month appt. Pt states cough is improved. Pt states sh ehas an occasional dry cough.  Comments Medications reviewed with patient Carron Curie CMA  December 22, 2010 1:38 PM Daytime phone number verified with patient.    Physical Exam  General:  oveerweight  Head:  normocephalic and atraumatic Eyes:  PERRLA/EOM intact; conjunctiva and sclera clear Ears:  TMs intact and clear with normal canals Nose:  no deformity, discharge, inflammation, or lesions Mouth:  no deformity or lesionsMelampatti Class II.   Neck:  no masses, thyromegaly, or abnormal cervical nodes Chest Wall:  no deformities noted Lungs:  clear bilaterally to auscultation and percussion Heart:  regular rate and rhythm, S1, S2 without murmurs, rubs, gallops, or clicks Abdomen:  bowel sounds positive; abdomen soft and non-tender without masses, or organomegaly Msk:  no deformity or scoliosis  noted with normal posture Pulses:  pulses normal Extremities:  no clubbing, cyanosis, edema, or deformity noted Neurologic:  CN II-XII grossly intact with normal reflexes, coordination, muscle strength and tone Skin:  intact without lesions or rashes Cervical Nodes:  no significant adenopathy Axillary Nodes:  no significant adenopathy Psych:  alert and cooperative; normal mood and affect; normal attention span and concentration   Impression & Recommendations:  Problem # 1:  COUGH (ICD-786.2) Assessment Improved  Orders: Speech Therapy (Speech Therapy)  Tough situation. Cough is likely multifactorial. THere is hx of sinus drainage but she has failed nasal inhlaer Rx. They wont try antihistamine or sudafed to hx of seizures. She is currently on GERD Rx and GI followup but this has not helped. Although she continues to take ALENDRONATE which could be contributing to GERD. HAs failed asthma Rx with symbicort/singulair in past. No evidence of fibrosis on CT chest (multiple). There are lot of features to suggest HABIT COUGH v VCD  Followup 01/04/2010: cough had improved with cyclical cough protocol, stopping alendronate and tessalon perles. This suggests signfiicant VCD  Follouwp 07/13/2010: cough unimproved. START NEURONTIN (okayed by Dr. Sandria Manly)  Followup 08/17/2010: Cough improved 70% with neurontin 100mg  by  mouth three times a day. Recommendd to increase neurontin  Followup 12/22/2010: cough improved to 90% of baseline after inrease in neurontin to 300mg  by mouth three times a day    PLAN continue  neurontin gradually to 300mg  by mouth three times a day (tolerateing well so far) FU 9  months  Orders: Prescription Created Electronically [phone]) Est. Patient Level III ([address])  Problem # 2:  PREVENTIVE HEALTH CARE (ICD-V70.0) Assessment: New  She is asking for pneumovax.Patient insists that she never got pneumovax. Our records indicate she got it somewhere outside in 2011 but patient  denies this. WE called Dr. Fuller Mandril office but he has not seen her in 2 years and has not given her pneumovax. Today';s caretaker does not know if patient got pneumovax. her mom is now sick with cancer. So, we do not know if patient ever got pneumovax. She is less than 65 but is a resident of a facility, so we will give her pneuomvax today  Orders: Est. Patient Level III ([address])  Medications Added to Medication List This Visit: 1)  Gabapentin 300 Mg Caps (Gabapentin) .... Take 1 capsule three times a day  Patient Instructions: 1)  continue neurontin at current dose of 300mg  by mouth three times a day 2)  return in 9 months 3)  pneumovax given today Prescriptions: GABAPENTIN 300 MG CAPS (GABAPENTIN) take 1 capsule three times a day  #90 x 9   Entered and Authorized by:   Kalman Shan MD   Signed by:   Kalman Shan MD on 12/22/2010   Method used:   Print then Give to Patient   RxID:   [id number]     Appended Document: 3 months/ mbw    Clinical Lists Changes  Orders: Added new Service order of Pneumococcal Vaccine (78469) - Signed Added new Service order of Admin 1st Vaccine (62952) - Signed Observations: Added new observation of PNEUMOVAXLOT: 1170AA (12/22/2010 14:29) Added new observation of PNEUMOVAXEXP: 04/12/2012 (12/22/2010 14:29) Added new observation of PNEUMOVAXBY: Armita Galloway RCP, LPN (84/13/2440 14:29) Added new observation of PNEUMOVAXRTE: IM (12/22/2010 14:29) Added new observation of PNEUMOVAXDOS: 0.5 ml (12/22/2010 14:29) Added new observation of PNEUMOVAXMFR: Merck (12/22/2010 14:29) Added new observation of PNEUMOVAXSIT: left deltoid (12/22/2010 14:29) Added new observation of PNEUMOVAX: Pneumovax (12/22/2010 14:29)       Immunizations Administered:  Pneumonia Vaccine:    Vaccine Type: Pneumovax    Site: left deltoid    Mfr: Merck    Dose: 0.5 ml    Route: IM    Given by: Renold Genta RCP, LPN    Exp. Date: 04/12/2012     Lot #: [id number] Renold Genta RCP, LPN  December 22, 2010 2:31 PM

## 2011-01-05 NOTE — Progress Notes (Signed)
Summary: Medical Exam Form/Umar  Medical Exam Form/Umar   Imported By: Sherian Rein 12/29/2010 10:09:46  _____________________________________________________________________  External Attachment:    Type:   Image     Comment:   External Document

## 2011-01-19 ENCOUNTER — Ambulatory Visit
Admission: RE | Admit: 2011-01-19 | Discharge: 2011-01-19 | Disposition: A | Discharge status: Home / Self Care | Payer: Medicaid Other | Source: Ambulatory Visit | Attending: Family Medicine | Admitting: Family Medicine

## 2011-01-19 DIAGNOSIS — Z1239 Encounter for other screening for malignant neoplasm of breast: Secondary | ICD-10-CM

## 2011-03-16 LAB — GLUCOSE, CAPILLARY: Glucose-Capillary: 99 mg/dL (ref 70–99)

## 2011-07-28 ENCOUNTER — Other Ambulatory Visit: Payer: Self-pay | Admitting: Family Medicine

## 2011-07-28 ENCOUNTER — Ambulatory Visit
Admission: RE | Admit: 2011-07-28 | Discharge: 2011-07-28 | Disposition: A | Discharge status: Home / Self Care | Payer: Medicare Other | Source: Ambulatory Visit | Attending: Family Medicine | Admitting: Family Medicine

## 2011-07-28 DIAGNOSIS — W19XXXA Unspecified fall, initial encounter: Secondary | ICD-10-CM

## 2011-08-23 ENCOUNTER — Encounter: Payer: Self-pay | Admitting: Internal Medicine

## 2011-08-24 ENCOUNTER — Telehealth: Payer: Self-pay | Admitting: Internal Medicine

## 2011-08-24 ENCOUNTER — Encounter: Payer: Self-pay | Admitting: Internal Medicine

## 2011-08-24 ENCOUNTER — Ambulatory Visit (INDEPENDENT_AMBULATORY_CARE_PROVIDER_SITE_OTHER): Payer: Medicare Other | Admitting: Internal Medicine

## 2011-08-24 VITALS — BP 122/72 | HR 87 | Temp 98.1°F | Ht 67.0 in | Wt 177.6 lb

## 2011-08-24 DIAGNOSIS — Z23 Encounter for immunization: Secondary | ICD-10-CM

## 2011-08-24 DIAGNOSIS — R05 Cough: Secondary | ICD-10-CM

## 2011-08-24 MED ORDER — FLUTICASONE PROPIONATE 50 MCG/ACT NA SUSP: 2.0000 | Freq: Every day | NASAL | Status: DC

## 2011-08-24 MED ORDER — BECLOMETHASONE DIPROPIONATE 80 MCG/ACT IN AERS: 1.0000 | INHALATION_SPRAY | Freq: Two times a day (BID) | RESPIRATORY_TRACT | Status: DC

## 2011-08-24 NOTE — Progress Notes (Signed)
Subjective:    Patient ID: Nicole [name], female    DOB: Apr 22, 1951, 60 y.o.   MRN: [id number]  HPI : Followup Chronic Cough (since 2007)   OV 07/13/2010: Presents with careatker CArla (she is one on one iwht patient for years) . Mom not present today. Last visit in FEb 2011. AT that time they other caretaker [name] and mom reported cough was 90% better on cylical cough protocol and coming off alendronate. TOday, other caretaker [name] tells me that cyclical cough protocol never helped. Cough is unchanged and persistent. She describes it as intense episodes that come on at random and lasts 90 seconds. It is dry. No clear cut precipitating or aggravating factors. No cough at night. Still with alopecia. REC: NEURONTIN FOR CYCLICAL COUGH   OV August 17, 2010: Taking neurontin 100mg  by mouth three times a day . Cough now has improved over 70% according to caretaker. Caretaker also ffeels behavior is better. No side effects from neurontin. Patient also agrees cough is tremondously better. Denies associated nausea, vomit, diarrhea, fever, chills, edema, dyspnea, wheeze, sinus drainage, gerd. REC: [name]   OV December 22, 2010: Follwup chronic cough. Now on neurontin 300mg  by mouth three times a day according to care taker who has confirmed the dose after calling the care facility. Toleratin it well. CAretaker says cough is 90% better. Patient also agrees. No new complaint. Denies associated nausea, vomit, diarrhea, fever, chills, sputum, wheeze, headaches, sleepiness, siezures, gerd. She states she want pneumovax.    Patient Instructions:  1) continue neurontin at current dose of 300mg  by mouth three times a day  2) return in 9 months  3) pneumovax given today    OV9/20/12:  Followup cyclical cough. Caretaker says cough is much better compared to last year. However, notices to have cough with exposure to [facility], a cheap perfume by worker in the home,  and also occasionally during a meal. There  is associated post nasal drainage per mom. She is continuing neurontin which the caretaker says has helped significantly. Current RSI score 7 (mild post nasal drip,  Moderate annoying cough and mild cough sometimes during eating but no choking on food)  PMHx: alopecia continues - npow wearing wig. Also muscle strain to rt shoulder after fall  Fam hx: Mom now diagnosed with lymphoma - past year finished chemo. Mom is with her today     Review of Systems  Constitutional: Negative for fever and unexpected weight change.  HENT: Positive for congestion and postnasal drip. Negative for ear pain, nosebleeds, sore throat, rhinorrhea, sneezing, trouble swallowing, dental problem and sinus pressure.   Eyes: Negative for redness and itching.  Respiratory: Positive for cough. Negative for chest tightness, shortness of breath and wheezing.   Cardiovascular: Negative for palpitations and leg swelling.  Gastrointestinal: Negative for nausea and vomiting.  Genitourinary: Negative for dysuria.  Musculoskeletal: Negative for joint swelling.  Skin: Negative for rash.  Neurological: Negative for headaches.  Hematological: Does not bruise/bleed easily.  Psychiatric/Behavioral: Negative for dysphoric mood. The patient is not nervous/anxious.        Objective:   Physical Exam  General: oveerweight . Wearing a blond wig Head: normocephalic and atraumatic  Eyes: PERRLA/EOM intact; conjunctiva and sclera clear  Ears: TMs intact and clear with normal canals  Nose: no deformity, discharge, inflammation, or lesions  Mouth: no deformity or lesionsMelampatti Class II.  Neck: no masses, thyromegaly, or abnormal cervical nodes  Chest Wall: no deformities noted  Lungs: clear bilaterally to  auscultation and percussion  Heart: regular rate and rhythm, S1, S2 without murmurs, rubs, gallops, or clicks  Abdomen: bowel sounds positive; abdomen soft and non-tender without masses, or organomegaly  Msk: no deformity or  scoliosis noted with normal posture  Pulses: pulses normal  Extremities: no clubbing, cyanosis, edema, or deformity noted  Neurologic: CN II-XII grossly intact with normal reflexes, coordination, muscle strength and tone  Skin: intact without lesions or rashes  Cervical Nodes: no significant adenopathy  Axillary Nodes: no significant adenopathy  Psych: alert and cooperative; normal mood and affect; normal attention span and concentration        Assessment & Plan:

## 2011-08-24 NOTE — Patient Instructions (Signed)
Please continue neurontin as before Please use netti pot three times  A week - nurse will show picture Please use generic fluticasone nasal inhaler 2 squirts each nostril once daily - nurse will do script Please use qvar 2 puff twice daily - nurse will give sample and script and teach technique - if you cannot do it , she will teach with spacer Please have flu shot today Return in 2  weeks

## 2011-08-24 NOTE — Telephone Encounter (Signed)
Called and spoke with pt's caregiver and informed her neti pot is OTC.  She verbalized understanding.  Nothing further needed.

## 2011-08-25 NOTE — Assessment & Plan Note (Signed)
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 Current RSI LPR score is low but I suspect if we stop neurontin she will get worse. tehre might be an asthma component contributing to residual cough along with ongoing nasal drainage. I will Rx with qvar and nasal steroids and reassess  PLAN Please continue neurontin as before Please use netti pot three times  A week - nurse will show picture Please use generic fluticasone nasal inhaler 2 squirts each nostril once daily - nurse will do script Please use qvar 2 puff twice daily - nurse will give sample and script and teach technique - if you cannot do it , she will teach with spacer Please have flu shot today Return in 2  weeks

## 2011-08-28 ENCOUNTER — Telehealth: Payer: Self-pay | Admitting: Internal Medicine

## 2011-08-28 NOTE — Telephone Encounter (Signed)
Spoke with pt's caregiver at the group home she lives in. She states that they are needing a physicians order faxed to them at [phone] in order for pt to receive saline rinses as discussed at last ov.

## 2011-08-29 NOTE — Telephone Encounter (Signed)
Rx written and faxed to number provided for pt to use netti pot 3 x per wk.

## 2011-09-18 ENCOUNTER — Ambulatory Visit: Payer: Medicare Other | Admitting: Internal Medicine

## 2011-09-28 ENCOUNTER — Ambulatory Visit (INDEPENDENT_AMBULATORY_CARE_PROVIDER_SITE_OTHER): Payer: Medicare Other | Admitting: Internal Medicine

## 2011-09-28 ENCOUNTER — Encounter: Payer: Self-pay | Admitting: Internal Medicine

## 2011-09-28 VITALS — BP 130/84 | HR 93 | Temp 98.0°F | Ht 67.0 in | Wt 179.4 lb

## 2011-09-28 DIAGNOSIS — R059 Cough, unspecified: Secondary | ICD-10-CM

## 2011-09-28 DIAGNOSIS — R05 Cough: Secondary | ICD-10-CM

## 2011-09-28 NOTE — Assessment & Plan Note (Signed)
Improved/Almost resolved  PLAN Continue current medication regimen  - Please continue neurontin as before - Please continue netti pot three times  A week - Please continue generic fluticasone nasal inhaler 2 squirts each nostril once daily - nurse will do script - Please continue qvar 2 puff twice daily  -Return in 6 months or sooner if needed

## 2011-09-28 NOTE — Progress Notes (Signed)
Subjective:    Patient ID: Nicole [name], female    DOB: 04-22-1951, 60 y.o.   MRN: [id number]  HPI OV 07/13/2010: Presents with careatker [name] (she is one on one iwht patient for years) . Mom not present today. Last visit in FEb 2011. AT that time they other caretaker [name] and mom reported cough was 90% better on cylical cough protocol and coming off alendronate. TOday, other caretaker [name] tells me that cyclical cough protocol never helped. Cough is unchanged and persistent. She describes it as intense episodes that come on at random and lasts 90 seconds. It is dry. No clear cut precipitating or aggravating factors. No cough at night. Still with alopecia. REC: NEURONTIN FOR CYCLICAL COUGH   OV August 17, 2010: Taking neurontin 100mg  by mouth three times a day . Cough now has improved over 70% according to caretaker. Caretaker also ffeels behavior is better. No side effects from neurontin. Patient also agrees cough is tremondously better. Denies associated nausea, vomit, diarrhea, fever, chills, edema, dyspnea, wheeze, sinus drainage, gerd. REC: [name]   OV December 22, 2010: Follwup chronic cough. Now on neurontin 300mg  by mouth three times a day according to care taker who has confirmed the dose after calling the care facility. Toleratin it well. CAretaker says cough is 90% better. Patient also agrees. No new complaint. Denies associated nausea, vomit, diarrhea, fever, chills, sputum, wheeze, headaches, sleepiness, siezures, gerd. She states she want pneumovax.    Patient Instructions:  1) continue neurontin at current dose of 300mg  by mouth three times a day  2) return in 9 months  3) pneumovax given today    OV9/20/12:  Followup cyclical cough. Caretaker says cough is much better compared to last year. However, notices to have cough with exposure to [facility], a cheap perfume by worker in the home,  and also occasionally during a meal. There is associated post nasal drainage per mom.  She is continuing neurontin which the caretaker says has helped significantly. Current RSI score 7 (mild post nasal drip,  Moderate annoying cough and mild cough sometimes during eating but no choking on food)  REC: Please continue neurontin as before  Please use netti pot three times A week - nurse will show picture  Please use generic fluticasone nasal inhaler 2 squirts each nostril once daily - nurse will do script  Please use qvar 2 puff twice daily - nurse will give sample and script and teach technique - if you cannot do it , she will teach with spacer  Please have flu shot today  Return in 2 weeks  OV 09/28/11: Followup cough. Last visit cough RSI score was 7 and much improved. Advised her to continue neurontin but for residual cough we added netti pot, nasal steroids and QVAR. Today at followup RSI cough score is 4 (only mild hoarseness, clearing of throat, difficulty swallowing, annoying cough). She herself feels much better and cough almost gone.  No changes to past, family, social hx since last visit     Review of Systems  Constitutional: Negative for fever and unexpected weight change.  HENT: Negative for ear pain, nosebleeds, congestion, sore throat, rhinorrhea, sneezing, trouble swallowing, dental problem, postnasal drip and sinus pressure.   Eyes: Negative for redness and itching.  Respiratory: Positive for cough. Negative for chest tightness, shortness of breath and wheezing.   Cardiovascular: Negative for palpitations and leg swelling.  Gastrointestinal: Negative for nausea and vomiting.  Genitourinary: Negative for dysuria.  Musculoskeletal: Negative for joint  swelling.  Skin: Negative for rash.  Neurological: Negative for headaches.  Hematological: Does not bruise/bleed easily.  Psychiatric/Behavioral: Negative for dysphoric mood. The patient is not nervous/anxious.        Objective:   Physical Exam General: oveerweight . Wearing a blond wig Head: normocephalic  and atraumatic  Eyes: PERRLA/EOM intact; conjunctiva and sclera clear  Ears: TMs intact and clear with normal canals  Nose: no deformity, discharge, inflammation, or lesions  Mouth: no deformity or lesionsMelampatti Class II.  Neck: no masses, thyromegaly, or abnormal cervical nodes  Chest Wall: no deformities noted  Lungs: clear bilaterally to auscultation and percussion  Heart: regular rate and rhythm, S1, S2 without murmurs, rubs, gallops, or clicks  Abdomen: bowel sounds positive; abdomen soft and non-tender without masses, or organomegaly  Msk: no deformity or scoliosis noted with normal posture  Pulses: pulses normal  Extremities: no clubbing, cyanosis, edema, or deformity noted  Neurologic: CN II-XII grossly intact with normal reflexes, coordination, muscle strength and tone  Skin: intact without lesions or rashes  Cervical Nodes: no significant adenopathy  Axillary Nodes: no significant adenopathy  Psych: alert and cooperative; normal mood and affect; normal attention span and concentration         Assessment & Plan:

## 2011-09-28 NOTE — Patient Instructions (Signed)
Continue current medication regimen  - Please continue neurontin as before - Please continue netti pot three times  A week - Please continue generic fluticasone nasal inhaler 2 squirts each nostril once daily - nurse will do script - Please continue qvar 2 puff twice daily  -Return in 6 months or sooner if needed

## 2011-12-20 ENCOUNTER — Other Ambulatory Visit: Payer: Self-pay | Admitting: Family Medicine

## 2011-12-20 DIAGNOSIS — Z1231 Encounter for screening mammogram for malignant neoplasm of breast: Secondary | ICD-10-CM

## 2012-01-19 ENCOUNTER — Telehealth: Payer: Self-pay | Admitting: Internal Medicine

## 2012-01-19 NOTE — Telephone Encounter (Signed)
I spoke with [name] and stated it's "pre mixed package" and not sure how to answer that question regarding the type of salt packet. Also she stated pt is refusing the nettie pott and has that right too but just needs the okay from MR to d/c this. Also stated they have not seen pt nose bleed from this but this is the pt is reporting to them. Please advise MR, thanks

## 2012-01-19 NOTE — Telephone Encounter (Signed)
I spoke with Nicole [name] and she stated the person who administers this for pt will not be in until 3:00. I left our # for her to call when she gets on. Will await her call back

## 2012-01-19 NOTE — Telephone Encounter (Signed)
I spoke with carla and she states the netti pott has been causing pt to have nose bleeds x this past weekend. She is wanting to know if they can d/c the netti pott. Please advise Dr. Marchelle Gearing thanks

## 2012-01-19 NOTE — Telephone Encounter (Signed)
Spoke with [name] and notified of recs per MR. She verbalized understanding. States that we just need to send fax to her stating okay to d/c netti pot. This was faxed to her at [phone]. Placed in MR's scan folder.

## 2012-01-19 NOTE — Telephone Encounter (Signed)
Please say that I respect patient right to refuse. I wsa just trying to understand why the nose bleed and at first call the fact patient was refusing was not made clear to me. They can dc' it . Theere are several strenght saline packets that come with netti pot and wondered if she was using strong strength that could have caused mild epistaxis.

## 2012-01-19 NOTE — Telephone Encounter (Signed)
How much salt are they putting in it? ARe they putting the mild salt packet or strong one ?Just fleck of blood or lot of blood ?

## 2012-01-22 NOTE — Telephone Encounter (Signed)
This was faxed over Friday.

## 2012-02-13 ENCOUNTER — Ambulatory Visit
Admission: RE | Admit: 2012-02-13 | Discharge: 2012-02-13 | Disposition: A | Discharge status: Home / Self Care | Payer: Medicare Other | Source: Ambulatory Visit | Attending: Family Medicine | Admitting: Family Medicine

## 2012-02-13 DIAGNOSIS — Z1231 Encounter for screening mammogram for malignant neoplasm of breast: Secondary | ICD-10-CM

## 2012-02-27 ENCOUNTER — Ambulatory Visit (INDEPENDENT_AMBULATORY_CARE_PROVIDER_SITE_OTHER): Payer: Medicare Other | Admitting: Internal Medicine

## 2012-02-27 ENCOUNTER — Encounter: Payer: Self-pay | Admitting: Internal Medicine

## 2012-02-27 VITALS — BP 142/76 | HR 78 | Temp 98.0°F | Ht 67.0 in | Wt 182.2 lb

## 2012-02-27 DIAGNOSIS — R05 Cough: Secondary | ICD-10-CM

## 2012-02-27 DIAGNOSIS — B370 Candidal stomatitis: Secondary | ICD-10-CM

## 2012-02-27 MED ORDER — DIPHENHYD-HYDROCORT-NYSTATIN MT SUSP: 5.0000 mL | Freq: Four times a day (QID) | OROMUCOSAL | Status: DC

## 2012-02-27 MED ORDER — AZELASTINE HCL 0.1 % NA SOLN: 2.0000 | Freq: Every day | NASAL | Status: DC

## 2012-02-27 NOTE — Patient Instructions (Signed)
#  COUGH  Continue current medication regimen  - Please continue neurontin as before - OK to hold off netti pot due t  Nose bleeds - Please continue generic fluticasone nasal inhaler 2 squirts each nostril once daily  - Try astepro nasal inhaler 2 squirts each nostril daily - nurse will do script - Please continue qvar 2 puff twice daily   #THRUSH   - there is mild yeas infection in mouth  - you need to gargle and rinse your mouth after you use qvar two times a day  - For Oral thrush: Take Suspension (swish and swallow): 500,000 units 4 times/day for 5 days; swish in the mouth and retain for as long as possible (several minutes) before swallowing - nurse wil do script  #Folllowup  -Return in 6 months or sooner if needed

## 2012-02-27 NOTE — Progress Notes (Signed)
Subjective:    Patient ID: Nicole [name], female    DOB: 18-Jan-1951, 61 y.o.   MRN: [id number]  HPI OV 07/13/2010: Presents with careatker [name] (she is one on one iwht patient for years) . Mom not present today. Last visit in FEb 2011. AT that time they other caretaker [name] and mom reported cough was 90% better on cylical cough protocol and coming off alendronate. TOday, other caretaker [name] tells me that cyclical cough protocol never helped. Cough is unchanged and persistent. She describes it as intense episodes that come on at random and lasts 90 seconds. It is dry. No clear cut precipitating or aggravating factors. No cough at night. Still with alopecia. REC: NEURONTIN FOR CYCLICAL COUGH   OV August 17, 2010: Taking neurontin 100mg  by mouth three times a day . Cough now has improved over 70% according to caretaker. Caretaker also ffeels behavior is better. No side effects from neurontin. Patient also agrees cough is tremondously better. Denies associated nausea, vomit, diarrhea, fever, chills, edema, dyspnea, wheeze, sinus drainage, gerd. REC: [name]   OV December 22, 2010: Follwup chronic cough. Now on neurontin 300mg  by mouth three times a day according to care taker who has confirmed the dose after calling the care facility. Toleratin it well. CAretaker says cough is 90% better. Patient also agrees. No new complaint. Denies associated nausea, vomit, diarrhea, fever, chills, sputum, wheeze, headaches, sleepiness, siezures, gerd. She states she want pneumovax.    Patient Instructions:  1) continue neurontin at current dose of 300mg  by mouth three times a day  2) return in 9 months  3) pneumovax given today    OV9/20/12:  Followup cyclical cough. Caretaker says cough is much better compared to last year. However, notices to have cough with exposure to [facility], a cheap perfume by worker in the home,  and also occasionally during a meal. There is associated post nasal drainage per mom.  She is continuing neurontin which the caretaker says has helped significantly. Current RSI score 7 (mild post nasal drip,  Moderate annoying cough and mild cough sometimes during eating but no choking on food)  REC: Please continue neurontin as before  Please use netti pot three times A week - nurse will show picture  Please use generic fluticasone nasal inhaler 2 squirts each nostril once daily - nurse will do script  Please use qvar 2 puff twice daily - nurse will give sample and script and teach technique - if you cannot do it , she will teach with spacer  Please have flu shot today  Return in 2 weeks  OV 09/28/11: Followup cough. Last visit cough RSI score was 7 and much improved. Advised her to continue neurontin but for residual cough we added netti pot, nasal steroids and QVAR. Today at followup RSI cough score is 4 (only mild hoarseness, clearing of throat, difficulty swallowing, annoying cough). She herself feels much better and cough almost gone.  No changes to past, family, social hx since last visit  REC  Continue current medication regimen - Please continue neurontin as before  - Please continue netti pot three times A week  - Please continue generic fluticasone nasal inhaler 2 squirts each nostril once daily - nurse will do script  - Please continue qvar 2 puff twice daily  -Return in 6 months or sooner if needed   OV 02/27/2012 FU chronic cough.  RSI cough score 6 which means hardly coughing. She feels neurontin has worked wonders for her and  she does not want to stop it. Aide vouches she hardl coughs. STopped netti pot. Concerned about pollen season and  Cough recurring. Compliant with other medications. Denies GERD or post nasal drip   Current outpatient prescriptions:beclomethasone (QVAR) 80 MCG/ACT inhaler, Inhale 1 puff into the lungs 2 (two) times daily., Disp: 1 Inhaler, Rfl: 6;  benzonatate (TESSALON) 100 MG capsule, Take 100 mg by mouth 3 (three) times daily as  needed.  , Disp: , Rfl: ;  busPIRone (BUSPAR) 15 MG tablet, Take 15 mg by mouth 2 (two) times daily.  , Disp: , Rfl:  calcium carbonate (OS-CAL) 600 MG TABS, Take 600 mg by mouth 3 (three) times daily with meals.  , Disp: , Rfl: ;  carbamazepine (TEGRETOL XR) 400 MG 12 hr tablet, Take 400 mg by mouth. 1 in the morning and 2 at bedtime , Disp: , Rfl: ;  dexlansoprazole (DEXILANT) 60 MG capsule, Take 60 mg by mouth daily.  , Disp: , Rfl: ;  fluticasone (FLONASE) 50 MCG/ACT nasal spray, Place 2 sprays into the nose daily., Disp: 16 g, Rfl: 6 gabapentin (NEURONTIN) 300 MG capsule, Take 300 mg by mouth 3 (three) times daily.  , Disp: , Rfl: ;  ibuprofen (ADVIL,MOTRIN) 200 MG tablet, Take 400 mg by mouth every 6 (six) hours as needed.  , Disp: , Rfl: ;  levETIRAcetam (KEPPRA) 500 MG tablet, Take 500 mg by mouth 2 (two) times daily.  , Disp: , Rfl: ;  Multiple Vitamins-Minerals (CENTROVITE) TABS, Take 1 tablet by mouth daily.  , Disp: , Rfl:  pimecrolimus (ELIDEL) 1 % cream, Apply topically as directed.  , Disp: , Rfl: ;  Selenium 200 MCG TABS, Take 1 tablet by mouth daily.  , Disp: , Rfl: ;  vitamin D, CHOLECALCIFEROL, 400 UNITS tablet, Take 400 Units by mouth daily.  , Disp: , Rfl: ;  Wheat Dextrin (BENEFIBER) CHEW, Chew 1 tablet by mouth 2 (two) times daily.  , Disp: , Rfl:      Review of Systems  Constitutional: Negative for fever and unexpected weight change.  HENT: Negative for ear pain, nosebleeds, congestion, sore throat, rhinorrhea, sneezing, trouble swallowing, dental problem, postnasal drip and sinus pressure.   Eyes: Negative for redness and itching.  Respiratory: Negative for cough, chest tightness, shortness of breath and wheezing.   Cardiovascular: Negative for palpitations and leg swelling.  Gastrointestinal: Negative for nausea and vomiting.  Genitourinary: Negative for dysuria.  Musculoskeletal: Negative for joint swelling.  Skin: Negative for rash.  Neurological: Negative for headaches.   Hematological: Does not bruise/bleed easily.  Psychiatric/Behavioral: Negative for dysphoric mood. The patient is not nervous/anxious.        Objective:   Physical Exam General: oveerweight . Wearing a blond wig Head: normocephalic and atraumatic  Eyes: PERRLA/EOM intact; conjunctiva and sclera clear  Ears: TMs intact and clear with normal canals  Nose: no deformity, discharge, inflammation, or lesions  Mouth: no deformity or lesionsMelampatti Class II. THRUSH + in SOFT PALATE Neck: no masses, thyromegaly, or abnormal cervical nodes  Chest Wall: no deformities noted  Lungs: clear bilaterally to auscultation and percussion  Heart: regular rate and rhythm, S1, S2 without murmurs, rubs, gallops, or clicks  Abdomen: bowel sounds positive; abdomen soft and non-tender without masses, or organomegaly  Msk: no deformity or scoliosis noted with normal posture  Pulses: pulses normal  Extremities: no clubbing, cyanosis, edema, or deformity noted  Neurologic: CN II-XII grossly intact with normal reflexes, coordination, muscle strength and tone  Skin: intact without lesions or rashes  Cervical Nodes: no significant adenopathy  Axillary Nodes: no significant adenopathy  Psych: alert and cooperative; normal mood and affect; normal attention span and concentration         Assessment & Plan:

## 2012-02-28 ENCOUNTER — Encounter: Payer: Self-pay | Admitting: Internal Medicine

## 2012-02-28 DIAGNOSIS — B370 Candidal stomatitis: Secondary | ICD-10-CM | POA: Insufficient documentation

## 2012-02-28 NOTE — Assessment & Plan Note (Signed)
New on exam 02/27/12. Advised # Oral thrush - For Oral thrush: Take Suspension (swish and swallow): 500,000 units 4 times/day for 5 days; swish in the mouth and retain for as long as possible (several minutes) before swallowing and to rinse mouth after QVAR

## 2012-02-28 NOTE — Assessment & Plan Note (Addendum)
Much improved. Hardly any cough. RSI score is only 6. OFf nett pot. Advised to continue medications. She wants to continue neurontin. Will add astepro for the pollen season

## 2012-02-29 ENCOUNTER — Telehealth: Payer: Self-pay | Admitting: Internal Medicine

## 2012-02-29 NOTE — Telephone Encounter (Signed)
See previous message that was taken on this pt about the same issue from today's date

## 2012-02-29 NOTE — Telephone Encounter (Signed)
The caller is from where the pt is employed and the patient has the med with her and they wanted order to administer. i told them if they can not go off of the bottle with MR's name and directions that they would need to call the home where pt lives to get any order they may need since this is where we send things for the pt to get her meds--crystal verbalized understanding and will call the place where pt lives--

## 2012-03-05 ENCOUNTER — Other Ambulatory Visit (HOSPITAL_COMMUNITY): Payer: Self-pay | Admitting: Neurology

## 2012-03-05 DIAGNOSIS — M899 Disorder of bone, unspecified: Secondary | ICD-10-CM

## 2012-03-26 ENCOUNTER — Ambulatory Visit (HOSPITAL_COMMUNITY)
Admission: RE | Admit: 2012-03-26 | Discharge: 2012-03-26 | Disposition: A | Discharge status: Home / Self Care | Payer: Medicare Other | Source: Ambulatory Visit | Attending: Neurology | Admitting: Neurology

## 2012-03-26 DIAGNOSIS — M899 Disorder of bone, unspecified: Secondary | ICD-10-CM | POA: Insufficient documentation

## 2012-03-26 DIAGNOSIS — M949 Disorder of cartilage, unspecified: Secondary | ICD-10-CM | POA: Insufficient documentation

## 2012-03-26 DIAGNOSIS — Z13820 Encounter for screening for osteoporosis: Secondary | ICD-10-CM | POA: Insufficient documentation

## 2012-05-31 ENCOUNTER — Other Ambulatory Visit: Payer: Self-pay | Admitting: Internal Medicine

## 2012-05-31 MED ORDER — FLUTICASONE PROPIONATE 50 MCG/ACT NA SUSP: 2.0000 | Freq: Every day | NASAL | Status: DC

## 2012-05-31 NOTE — Telephone Encounter (Signed)
[facility] REQUESTIING FLUTICASONE 50 MCG <> INHALE 2 SPRAYS IN EACH NOSTRIL ONCE DAILY RX SENT

## 2012-08-13 ENCOUNTER — Telehealth: Payer: Self-pay | Admitting: Internal Medicine

## 2012-08-13 NOTE — Telephone Encounter (Signed)
Called pt x3 and left messages to schd a follow up apt. No return call back. Sent letter 08/06/12. °

## 2012-09-02 ENCOUNTER — Ambulatory Visit: Payer: Medicare Other | Admitting: Internal Medicine

## 2012-09-02 ENCOUNTER — Telehealth: Payer: Self-pay | Admitting: Internal Medicine

## 2012-09-02 ENCOUNTER — Encounter: Payer: Self-pay | Admitting: Internal Medicine

## 2012-09-02 ENCOUNTER — Ambulatory Visit (INDEPENDENT_AMBULATORY_CARE_PROVIDER_SITE_OTHER): Payer: Medicare Other | Admitting: Internal Medicine

## 2012-09-02 VITALS — BP 120/70 | HR 75 | Temp 97.8°F | Ht 67.0 in | Wt 187.0 lb

## 2012-09-02 DIAGNOSIS — B370 Candidal stomatitis: Secondary | ICD-10-CM

## 2012-09-02 DIAGNOSIS — R05 Cough: Secondary | ICD-10-CM

## 2012-09-02 MED ORDER — MOMETASONE FUROATE 220 MCG/INH IN AEPB: 2.0000 | INHALATION_SPRAY | Freq: Every day | RESPIRATORY_TRACT | Status: DC

## 2012-09-02 NOTE — Telephone Encounter (Signed)
Called and spoke with [name].  [name] states that she would like MR to please d/c patients benzonatate since patient has not taken in over a year.  [name] also stated that there was some confusion on FLONASE and FLUTICASONE seeing that they have one paper stating 0.05% and one paper stating .  I informed [name] that these were generic.brand names.  She stating understanding of this but like a Rx sent to facility so they can have it clear on record what patient is taking.  MR Please advise if the two above are ok to do, thank you

## 2012-09-02 NOTE — Assessment & Plan Note (Signed)
#  THRUSH   - there is mild yeas infection in mouth  - you need to gargle and rinse your mouth after you use qvar two times a day  - For Oral thrush: Take Suspension (swish and swallow): 500,000 units 4 times/day for 5 days; swish in the mouth and retain for as long as possible (several minutes) before swallowing - CMA wil do script  #Folllowup  -Return in 6 months or sooner if needed

## 2012-09-02 NOTE — Patient Instructions (Addendum)
#  COUGH  Continue current medication regimen  - Please continue neurontin as before -- Please continue generic fluticasone nasal inhaler 2 squirts each nostril once daily  - Continue astepro nasal inhaler 2 squirts each nostril daily - nurse will do script - Due to yeast infection in mouth, stop QVAR. Instead try asmanex once daily - CMA will show you technique   #THRUSH   - there is mild yeas infection in mouth  - you need to gargle and rinse your mouth after you use qvar two times a day  - For Oral thrush: Take Suspension (swish and swallow): 500,000 units 4 times/day for 5 days; swish in the mouth and retain for as long as possible (several minutes) before swallowing - CMA wil do script  #Folllowup  -Return in 6 months or sooner if needed

## 2012-09-02 NOTE — Assessment & Plan Note (Signed)
#  COUGH  Continue current medication regimen  - Please continue neurontin as before -- Please continue generic fluticasone nasal inhaler 2 squirts each nostril once daily  - Continue astepro nasal inhaler 2 squirts each nostril daily - nurse will do script - Due to yeast infection in mouth, stop QVAR. Instead try asmanex once daily - CMA will show you technique

## 2012-09-02 NOTE — Progress Notes (Signed)
Subjective:    Patient ID: Nicole [name], female    DOB: 18-Jan-1951, 61 y.o.   MRN: [id number]  HPI OV 07/13/2010: Presents with careatker [name] (she is one on one iwht patient for years) . Mom not present today. Last visit in FEb 2011. AT that time they other caretaker [name] and mom reported cough was 90% better on cylical cough protocol and coming off alendronate. TOday, other caretaker [name] tells me that cyclical cough protocol never helped. Cough is unchanged and persistent. She describes it as intense episodes that come on at random and lasts 90 seconds. It is dry. No clear cut precipitating or aggravating factors. No cough at night. Still with alopecia. REC: NEURONTIN FOR CYCLICAL COUGH   OV August 17, 2010: Taking neurontin 100mg  by mouth three times a day . Cough now has improved over 70% according to caretaker. Caretaker also ffeels behavior is better. No side effects from neurontin. Patient also agrees cough is tremondously better. Denies associated nausea, vomit, diarrhea, fever, chills, edema, dyspnea, wheeze, sinus drainage, gerd. REC: [name]   OV December 22, 2010: Follwup chronic cough. Now on neurontin 300mg  by mouth three times a day according to care taker who has confirmed the dose after calling the care facility. Toleratin it well. CAretaker says cough is 90% better. Patient also agrees. No new complaint. Denies associated nausea, vomit, diarrhea, fever, chills, sputum, wheeze, headaches, sleepiness, siezures, gerd. She states she want pneumovax.    Patient Instructions:  1) continue neurontin at current dose of 300mg  by mouth three times a day  2) return in 9 months  3) pneumovax given today    OV9/20/12:  Followup cyclical cough. Caretaker says cough is much better compared to last year. However, notices to have cough with exposure to [facility], a cheap perfume by worker in the home,  and also occasionally during a meal. There is associated post nasal drainage per mom.  She is continuing neurontin which the caretaker says has helped significantly. Current RSI score 7 (mild post nasal drip,  Moderate annoying cough and mild cough sometimes during eating but no choking on food)  REC: Please continue neurontin as before  Please use netti pot three times A week - nurse will show picture  Please use generic fluticasone nasal inhaler 2 squirts each nostril once daily - nurse will do script  Please use qvar 2 puff twice daily - nurse will give sample and script and teach technique - if you cannot do it , she will teach with spacer  Please have flu shot today  Return in 2 weeks  OV 09/28/11: Followup cough. Last visit cough RSI score was 7 and much improved. Advised her to continue neurontin but for residual cough we added netti pot, nasal steroids and QVAR. Today at followup RSI cough score is 4 (only mild hoarseness, clearing of throat, difficulty swallowing, annoying cough). She herself feels much better and cough almost gone.  No changes to past, family, social hx since last visit  REC  Continue current medication regimen - Please continue neurontin as before  - Please continue netti pot three times A week  - Please continue generic fluticasone nasal inhaler 2 squirts each nostril once daily - nurse will do script  - Please continue qvar 2 puff twice daily  -Return in 6 months or sooner if needed   OV 02/27/2012 FU chronic cough.  RSI cough score 6 which means hardly coughing. She feels neurontin has worked wonders for her and  she does not want to stop it. Aide vouches she hardl coughs. STopped netti pot. Concerned about pollen season and  Cough recurring. Compliant with other medications. Denies GERD or post nasal drip   #COUGH  Continue current medication regimen - Please continue neurontin as before  - OK to hold off netti pot due t Nose bleeds  - Please continue generic fluticasone nasal inhaler 2 squirts each nostril once daily  - Try astepro nasal  inhaler 2 squirts each nostril daily - nurse will do script  - Please continue qvar 2 puff twice daily  #THRUSH  - there is mild yeas infection in mouth  - you need to gargle and rinse your mouth after you use qvar two times a day  - For Oral thrush: Take Suspension (swish and swallow): 500,000 units 4 times/day for 5 days; swish in the mouth and retain for as long as possible (several minutes) before swallowing - nurse wil do script  #Folllowup  -Return in 6 months or sooner if needed   OV 09/02/2012  Followup chronic cough. Mom died in 25-May-2012. Younger sister is now caretaker. She says past few weeks or months cough has gotten worse. It is more early in AM and none for rest of day. There is some mild white sputum. Unclear if there is post nasal drainage. She is doing astepro and fluticasone. Still refuses netti pot due to prior epistaxis. Denies wheezing. Compliant with qvar. Has thrush on mount  Past, Family, Social reviewed: no change since last visit except mom died. Still lives in group home   Review of Systems  Constitutional: Negative for fever and unexpected weight change.  HENT: Negative for ear pain, nosebleeds, congestion, sore throat, rhinorrhea, sneezing, trouble swallowing, dental problem, postnasal drip and sinus pressure.   Eyes: Negative for redness and itching.  Respiratory: Negative for cough, chest tightness, shortness of breath and wheezing.   Cardiovascular: Negative for palpitations and leg swelling.  Gastrointestinal: Negative for nausea and vomiting.  Genitourinary: Negative for dysuria.  Musculoskeletal: Negative for joint swelling.  Skin: Negative for rash.  Neurological: Negative for headaches.  Hematological: Does not bruise/bleed easily.  Psychiatric/Behavioral: Negative for dysphoric mood. The patient is not nervous/anxious.        Objective:   Physical Exam General: oveerweight . Wearing a blond wig Head: normocephalic and atraumatic  Eyes:  PERRLA/EOM intact; conjunctiva and sclera clear  Ears: TMs intact and clear with normal canals  Nose: no deformity, discharge, inflammation, or lesions  Mouth: no deformity or lesionsMelampatti Class II. THRUSH + in SOFT PALATE-- NO CHANGE Neck: no masses, thyromegaly, or abnormal cervical nodes  Chest Wall: no deformities noted  Lungs: clear bilaterally to auscultation and percussion  Heart: regular rate and rhythm, S1, S2 without murmurs, rubs, gallops, or clicks  Abdomen: bowel sounds positive; abdomen soft and non-tender without masses, or organomegaly  Msk: no deformity or scoliosis noted with normal posture  Pulses: pulses normal  Extremities: no clubbing, cyanosis, edema, or deformity noted  Neurologic: CN II-XII grossly intact with normal reflexes, coordination, muscle strength and tone  Skin: intact without lesions or rashes  Cervical Nodes: no significant adenopathy  Axillary Nodes: no significant adenopathy  Psych: alert and cooperative; normal mood and affect; normal attention span and concentration           Assessment & Plan:

## 2012-09-02 NOTE — Telephone Encounter (Signed)
To make it simple and cheap: just send generic fluticasone 2 squirts each nostril daily and dc tessaolon/benznotatae

## 2012-09-03 ENCOUNTER — Telehealth: Payer: Self-pay | Admitting: Internal Medicine

## 2012-09-03 MED ORDER — DIPHENHYD-HYDROCORT-NYSTATIN MT SUSP: 5.0000 mL | Freq: Four times a day (QID) | OROMUCOSAL | Status: DC

## 2012-09-03 NOTE — Telephone Encounter (Signed)
rx printed and faxed to lisa at [facility] at [phone]. Also called into pharmacy. Carron Curie, CMA

## 2012-09-03 NOTE — Addendum Note (Signed)
Addended by: Christen Butter on: 09/03/2012 09:29 AM   Modules accepted: Orders

## 2012-09-03 NOTE — Telephone Encounter (Signed)
Script faxed to [name] to d/c tessalon and pt to take fluticasone 2 sprays each nare qd.

## 2012-09-05 ENCOUNTER — Telehealth: Payer: Self-pay | Admitting: Internal Medicine

## 2012-09-05 NOTE — Telephone Encounter (Signed)
Nicole [name], [facility] 09/03/2012 5:22 PM Signed  rx printed and faxed to lisa at [facility] at [phone]. Also called into pharmacy. Nicole [name], CMA    I spoke with [facility] and she is aware this has already been taken care of. She did check with [name] and they do have the order. Nothing further is needed at this time.

## 2013-01-31 ENCOUNTER — Telehealth: Payer: Self-pay | Admitting: Internal Medicine

## 2013-01-31 NOTE — Telephone Encounter (Signed)
Called pt to schd follow up apt with MR. No answer x3. Sent letter 01/31/13

## 2013-02-06 ENCOUNTER — Other Ambulatory Visit: Payer: Self-pay | Admitting: Neurology

## 2013-02-06 ENCOUNTER — Ambulatory Visit
Admission: RE | Admit: 2013-02-06 | Discharge: 2013-02-06 | Disposition: A | Discharge status: Home / Self Care | Payer: Medicare Other | Source: Ambulatory Visit | Attending: Neurology | Admitting: Neurology

## 2013-02-06 DIAGNOSIS — R05 Cough: Secondary | ICD-10-CM

## 2013-02-19 ENCOUNTER — Other Ambulatory Visit: Payer: Self-pay | Admitting: Neurology

## 2013-02-19 DIAGNOSIS — W19XXXA Unspecified fall, initial encounter: Secondary | ICD-10-CM

## 2013-02-19 DIAGNOSIS — F71 Moderate intellectual disabilities: Secondary | ICD-10-CM

## 2013-02-19 DIAGNOSIS — G40209 Localization-related (focal) (partial) symptomatic epilepsy and epileptic syndromes with complex partial seizures, not intractable, without status epilepticus: Secondary | ICD-10-CM

## 2013-02-19 DIAGNOSIS — G250 Essential tremor: Secondary | ICD-10-CM

## 2013-02-26 ENCOUNTER — Ambulatory Visit
Admission: RE | Admit: 2013-02-26 | Discharge: 2013-02-26 | Disposition: A | Discharge status: Home / Self Care | Payer: Medicare Other | Source: Ambulatory Visit | Attending: Neurology | Admitting: Neurology

## 2013-02-26 DIAGNOSIS — G252 Other specified forms of tremor: Secondary | ICD-10-CM

## 2013-02-26 DIAGNOSIS — W19XXXA Unspecified fall, initial encounter: Secondary | ICD-10-CM

## 2013-02-26 DIAGNOSIS — F71 Moderate intellectual disabilities: Secondary | ICD-10-CM

## 2013-02-26 DIAGNOSIS — G40209 Localization-related (focal) (partial) symptomatic epilepsy and epileptic syndromes with complex partial seizures, not intractable, without status epilepticus: Secondary | ICD-10-CM

## 2013-02-26 DIAGNOSIS — G250 Essential tremor: Secondary | ICD-10-CM

## 2013-02-27 ENCOUNTER — Telehealth: Payer: Self-pay | Admitting: *Deleted

## 2013-02-27 NOTE — Telephone Encounter (Signed)
Left vm for Arlys John with findings and instructions to call if there are any questions.

## 2013-02-27 NOTE — Progress Notes (Signed)
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 There are no acute findings on the recent CT head, in particular no acute intracranial hemorrhage with a history of recent fall. Please call Nicole [name] at the patient's group home to inform him that there are no trauma related changes in her brain CT. Thanks Huston Foley, MD, PhD [facility] ([facility] ______

## 2013-03-03 ENCOUNTER — Encounter: Payer: Self-pay | Admitting: Internal Medicine

## 2013-03-03 ENCOUNTER — Ambulatory Visit (INDEPENDENT_AMBULATORY_CARE_PROVIDER_SITE_OTHER): Payer: Medicare Other | Admitting: Internal Medicine

## 2013-03-03 VITALS — BP 122/80 | HR 72 | Temp 97.3°F | Ht 66.0 in | Wt 191.0 lb

## 2013-03-03 DIAGNOSIS — R059 Cough, unspecified: Secondary | ICD-10-CM

## 2013-03-03 DIAGNOSIS — R05 Cough: Secondary | ICD-10-CM

## 2013-03-03 MED ORDER — HYPERTONIC NASAL WASH NA SOLN: NASAL | Status: DC

## 2013-03-03 NOTE — Progress Notes (Signed)
Subjective:    Patient ID: Nicole [name], female    DOB: 03/10/1951, 62 y.o.   MRN: [id number]  HPI HPI OV 07/13/2010: Presents with careatker Nicole [name] (she is one on one iwht patient for years) . Mom not present today. Last visit in FEb 2011. AT that time they other caretaker Nicole [name] and mom reported cough was 90% better on cylical cough protocol and coming off alendronate. TOday, other caretaker Nicole [name] tells me that cyclical cough protocol never helped. Cough is unchanged and persistent. She describes it as intense episodes that come on at random and lasts 90 seconds. It is dry. No clear cut precipitating or aggravating factors. No cough at night. Still with alopecia. REC: NEURONTIN FOR CYCLICAL COUGH   OV August 17, 2010: Taking neurontin 100mg  by mouth three times a day . Cough now has improved over 70% according to caretaker. Caretaker also ffeels behavior is better. No side effects from neurontin. Patient also agrees cough is tremondously better. Denies associated nausea, vomit, diarrhea, fever, chills, edema, dyspnea, wheeze, sinus drainage, gerd. REC: Nicole [name]   OV December 22, 2010: Follwup chronic cough. Now on neurontin 300mg  by mouth three times a day according to care taker who has confirmed the dose after calling the care facility. Toleratin it well. CAretaker says cough is 90% better. Patient also agrees. No new complaint. Denies associated nausea, vomit, diarrhea, fever, chills, sputum, wheeze, headaches, sleepiness, siezures, gerd. She states she want pneumovax.    Patient Instructions:  1) continue neurontin at current dose of 300mg  by mouth three times a day  2) return in 9 months  3) pneumovax given today    OV9/20/12:  Followup cyclical cough. Caretaker says cough is much better compared to last year. However, notices to have cough with exposure to Nicole [name], a cheap perfume by worker in the home,  and also occasionally during a meal. There is associated post nasal drainage per  mom. She is continuing neurontin which the caretaker says has helped significantly. Current RSI score 7 (mild post nasal drip,  Moderate annoying cough and mild cough sometimes during eating but no choking on food)  REC: Please continue neurontin as before  Please use netti pot three times A week - nurse will show picture  Please use generic fluticasone nasal inhaler 2 squirts each nostril once daily - nurse will do script  Please use qvar 2 puff twice daily - nurse will give sample and script and teach technique - if you cannot do it , she will teach with spacer  Please have flu shot today  Return in 2 weeks  OV 09/28/11: Followup cough. Last visit cough RSI score was 7 and much improved. Advised her to continue neurontin but for residual cough we added netti pot, nasal steroids and QVAR. Today at followup RSI cough score is 4 (only mild hoarseness, clearing of throat, difficulty swallowing, annoying cough). She herself feels much better and cough almost gone.  No changes to past, family, social hx since last visit   OV 02/27/2012 FU chronic cough.  RSI cough score 6 which means hardly coughing. She feels neurontin has worked wonders for her and she does not want to stop it. Aide vouches she hardl coughs. STopped netti pot. Concerned about pollen season and  Cough recurring. Compliant with other medications. Denies GERD or post nasal drip    OV Sepot May 12, 2012: Followup chronic cough. Mom died in May 12, 2012. Younger sister is now caretaker. She says past few weeks or months  cough has gotten worse. It is more early in AM and none for rest of day. There is some mild white sputum. Unclear if there is post nasal drainage. She is doing astepro and fluticasone. Still refuses netti pot due to prior epistaxis. Denies wheezing. Compliant with qvar. Has thrush on mountC  REC #COUGH  Continue current medication regimen  - Please continue neurontin as before -- Please continue generic fluticasone nasal  inhaler 2 squirts each nostril once daily  - Continue astepro nasal inhaler 2 squirts each nostril daily - nurse will do script - Due to yeast infection in mouth, stop QVAR. Instead try asmanex once daily - CMA will show you technique   #THRUSH   - there is mild yeas infection in mouth  - you need to gargle and rinse your mouth after you use qvar two times a day  - For Oral thrush: Take Suspension (swish and swallow): 500,000 units 4 times/day for 5 days; swish in the mouth and retain for as long as possible (several minutes) before swallowing - CMA wil do script  #Folllowup  -Return in 6 months or sooner if needed    OV 03/03/2013  Does not want to do nasal steroids due to epistaxis. But worried of spring allergies making cough worse. Overall cough is minimal and very tolerable. She has no new issues. She is compliant with medications. She is open to trying saline nasal spray  Past, Family, Social reviewed: no change since last visit   Review of Systems  Constitutional: Negative for fever and unexpected weight change.  HENT: Positive for sneezing. Negative for ear pain, nosebleeds, congestion, sore throat, rhinorrhea, trouble swallowing, dental problem, postnasal drip and sinus pressure.   Eyes: Negative for redness and itching.  Respiratory: Positive for cough. Negative for chest tightness, shortness of breath and wheezing.   Cardiovascular: Negative for palpitations and leg swelling.  Gastrointestinal: Negative for nausea and vomiting.  Genitourinary: Negative for dysuria.  Musculoskeletal: Negative for joint swelling.  Skin: Negative for rash.  Neurological: Negative for headaches.  Hematological: Does not bruise/bleed easily.  Psychiatric/Behavioral: Negative for dysphoric mood. The patient is not nervous/anxious.    Current outpatient prescriptions:busPIRone (BUSPAR) 15 MG tablet, Take 15 mg by mouth 2 (two) times daily.  , Disp: , Rfl: ;  calcium carbonate (OS-CAL)  600 MG TABS, Take 600 mg by mouth 3 (three) times daily with meals.  , Disp: , Rfl: ;  carbamazepine (TEGRETOL XR) 400 MG 12 hr tablet, Take 400 mg by mouth. 1 in the morning and 2 at bedtime , Disp: , Rfl: ;  dexlansoprazole (DEXILANT) 60 MG capsule, Take 60 mg by mouth daily.  , Disp: , Rfl:  Diphenhyd-Hydrocort-Nystatin SUSP, Use as directed 5 mLs in the mouth or throat 4 (four) times daily. For 5 days. Swish in the mouth and retain for as long as possible before swallowing, Disp: 120 mL, Rfl: 0;  fluticasone (FLONASE) 50 MCG/ACT nasal spray, Place 2 sprays into the nose daily., Disp: 16 g, Rfl: 6;  gabapentin (NEURONTIN) 300 MG capsule, Take 300 mg by mouth 3 (three) times daily.  , Disp: , Rfl:  ibuprofen (ADVIL,MOTRIN) 200 MG tablet, Take 400 mg by mouth every 6 (six) hours as needed.  , Disp: , Rfl: ;  levETIRAcetam (KEPPRA) 500 MG tablet, Take 500 mg by mouth 2 (two) times daily.  , Disp: , Rfl: ;  mometasone (ASMANEX 60 METERED DOSES) 220 MCG/INH inhaler, Inhale 2 puffs into the lungs daily.,  Disp: 1 Inhaler, Rfl: 12;  Multiple Vitamins-Minerals (CENTROVITE) TABS, Take 1 tablet by mouth daily.  , Disp: , Rfl:  pimecrolimus (ELIDEL) 1 % cream, Apply topically as directed.  , Disp: , Rfl: ;  Selenium 200 MCG TABS, Take 1 tablet by mouth daily.  , Disp: , Rfl: ;  vitamin D, CHOLECALCIFEROL, 400 UNITS tablet, Take 400 Units by mouth daily.  , Disp: , Rfl: ;  Wheat Dextrin (BENEFIBER) CHEW, Chew 1 tablet by mouth 2 (two) times daily.  , Disp: , Rfl:       Objective:   Physical Exam  Physical Exam General: oveerweight . Wearing a blond wig Head: normocephalic and atraumatic  Eyes: PERRLA/EOM intact; conjunctiva and sclera clear  Ears: TMs intact and clear with normal canals  Nose: no deformity, discharge, inflammation, or lesions  Mouth: no deformity or lesionsMelampatti Class II.  Neck: no masses, thyromegaly, or abnormal cervical nodes  Chest Wall: no deformities noted  Lungs: clear  bilaterally to auscultation and percussion  Heart: regular rate and rhythm, S1, S2 without murmurs, rubs, gallops, or clicks  Abdomen: bowel sounds positive; abdomen soft and non-tender without masses, or organomegaly  Msk: no deformity or scoliosis noted with normal posture  Pulses: pulses normal  Extremities: no clubbing, cyanosis, edema, or deformity noted  Neurologic: CN II-XII grossly intact with normal reflexes, coordination, muscle strength and tone  Skin: intact without lesions or rashes  Cervical Nodes: no significant adenopathy  Axillary Nodes: no significant adenopathy  Psych: alert and cooperative; normal mood and affect; normal attention span and concentration               Objective:      Assessment & Plan:

## 2013-03-03 NOTE — Patient Instructions (Addendum)
#  COUGH  Continue current medication regimen  - Please continue neurontin as before - Instead of nasal steroids, try 3% saline spray by [facility]. An OTC medication -- Continue  asmanex 200mcg once daily -    #Folllowup  -Return in 9 months or sooner if needed 

## 2013-03-06 ENCOUNTER — Telehealth: Payer: Self-pay | Admitting: Internal Medicine

## 2013-03-06 NOTE — Telephone Encounter (Signed)
Nicole [name] called back & asked to be reached before 9:00 this AM if possible. [name]

## 2013-03-06 NOTE — Telephone Encounter (Signed)
lmomtcb x1 for natasha 

## 2013-03-07 NOTE — Assessment & Plan Note (Signed)
#  COUGH  Continue current medication regimen  - Please continue neurontin as before - Instead of nasal steroids, try 3% saline spray by [facility]. An OTC medication -- Continue  asmanex once daily -    #Folllowup  -Return in 9 months or sooner if needed

## 2013-03-07 NOTE — Telephone Encounter (Signed)
Spoke with Nicole [name] states they have figured out what they were calling about in the first place. I made sure nothing further was needed, Nicole [name] verified this Nothing further needed at this time

## 2013-03-11 ENCOUNTER — Other Ambulatory Visit: Payer: Self-pay | Admitting: Neurology

## 2013-03-11 DIAGNOSIS — G40909 Epilepsy, unspecified, not intractable, without status epilepticus: Secondary | ICD-10-CM

## 2013-03-12 LAB — CBC WITH DIFFERENTIAL
Basophils Absolute: 0 10*3/uL (ref 0.0–0.2)
Eosinophils Absolute: 0 10*3/uL (ref 0.0–0.4)
Immature Granulocytes: 0 % (ref 0–2)
Lymphs: 19 % (ref 14–46)
Monocytes: 18 % — ABNORMAL HIGH (ref 4–12)
Platelets: 238 10*3/uL (ref 155–379)
RDW: 12.8 % (ref 12.3–15.4)
WBC: 2.1 10*3/uL — CL (ref 3.4–10.8)

## 2013-03-12 LAB — BASIC METABOLIC PANEL
BUN/Creatinine Ratio: 17 (ref 11–26)
BUN: 9 mg/dL (ref 8–27)
CO2: 22 mmol/L (ref 19–28)
Chloride: 92 mmol/L — ABNORMAL LOW (ref 97–108)
Glucose: 105 mg/dL — ABNORMAL HIGH (ref 65–99)
Potassium: 4.2 mmol/L (ref 3.5–5.2)

## 2013-03-12 LAB — CARBAMAZEPINE LEVEL, TOTAL: Carbamazepine Lvl: 13.5 ug/mL (ref 4.0–12.0)

## 2013-03-18 NOTE — Addendum Note (Signed)
Addended by: Huston Foley on: 03/18/2013 01:42 PM   Modules accepted: Orders

## 2013-03-19 NOTE — Progress Notes (Signed)
I called and spoke with Arlys John, caregiver.   Pt to come in for labs on 04/01/13.  Orders in EPIC.  Pt to come in around 0800 to have drawn and bring meds with her to take after labs drawn.  Arlene with [facility] stated pt does not need appt.

## 2013-04-01 ENCOUNTER — Other Ambulatory Visit: Payer: Self-pay | Admitting: Neurology

## 2013-04-01 LAB — BASIC METABOLIC PANEL
CO2: 25 mmol/L (ref 19–28)
Calcium: 9.2 mg/dL (ref 8.6–10.2)
Chloride: 87 mmol/L — ABNORMAL LOW (ref 96–108)
GFR calc non Af Amer: 105 mL/min/{1.73_m2} (ref >59)
Glucose: 77 mg/dL (ref 65–99)
Potassium: 4 mmol/L (ref 3.5–5.2)
Sodium: 125 mmol/L — ABNORMAL LOW (ref 134–144)

## 2013-04-01 LAB — CBC WITH DIFFERENTIAL
Basophils Absolute: 0 10*3/uL (ref 0.0–0.2)
Eosinophils Absolute: 0.1 10*3/uL (ref 0.0–0.4)
Lymphocytes Absolute: 0.4 10*3/uL — ABNORMAL LOW (ref 0.7–3.1)
MCHC: 35 g/dL (ref 31.5–35.7)
MCV: 90 fL (ref 79–97)
Monocytes: 13 % — ABNORMAL HIGH (ref 4–12)
Platelets: 220 10*3/uL (ref 155–379)
RDW: 12.7 % (ref 12.3–15.4)
WBC: 3.7 10*3/uL (ref 3.4–10.8)

## 2013-04-01 LAB — CARBAMAZEPINE LEVEL, TOTAL: Carbamazepine Lvl: 12.4 ug/mL (ref 4.0–12.0)

## 2013-04-01 NOTE — Progress Notes (Signed)
Quick Note:  Please call patient's caretaker Arlys John and tell him that her labs look better. Her white count is 3.7. Her carbamazepine level is 12.7. They can continue with the current dose he is of medications. I would like to encourage her to drink less free water and more Gatorade and she can have saltines crackers from time to time as her sodium level continues to be low and will likely always be low but it is lower than 3 weeks ago at 125. Again, I would like to encourage her to drink less free water and more juices and Gatorade or some other electrolyte drink. Please advise him to keep any upcoming followup appointments. Thanks Huston Foley, MD, PhD [facility] ______

## 2013-04-02 NOTE — Progress Notes (Signed)
Quick Note:  I called Arlys John, caregiver , he could not take call at this time. [phone]. I will call back. ______

## 2013-04-04 NOTE — Progress Notes (Signed)
Quick Note:  I called Nicole [name] and relayed the information from Dr. Frances Furbish. We also reviewed the date of next appointment of 08/11/2013. Su Ley BS RN ______

## 2013-07-06 ENCOUNTER — Emergency Department (HOSPITAL_COMMUNITY): Payer: Medicare Other

## 2013-07-06 ENCOUNTER — Encounter (HOSPITAL_COMMUNITY): Payer: Self-pay | Admitting: Emergency Medicine

## 2013-07-06 ENCOUNTER — Emergency Department (HOSPITAL_COMMUNITY)
Admission: EM | Admit: 2013-07-06 | Discharge: 2013-07-06 | Disposition: A | Discharge status: Home / Self Care | Payer: Medicare Other | Attending: Emergency Medicine | Admitting: Emergency Medicine

## 2013-07-06 DIAGNOSIS — G40909 Epilepsy, unspecified, not intractable, without status epilepticus: Secondary | ICD-10-CM | POA: Insufficient documentation

## 2013-07-06 DIAGNOSIS — Y9389 Activity, other specified: Secondary | ICD-10-CM | POA: Insufficient documentation

## 2013-07-06 DIAGNOSIS — S0101XA Laceration without foreign body of scalp, initial encounter: Secondary | ICD-10-CM

## 2013-07-06 DIAGNOSIS — Z79899 Other long term (current) drug therapy: Secondary | ICD-10-CM | POA: Insufficient documentation

## 2013-07-06 DIAGNOSIS — W19XXXA Unspecified fall, initial encounter: Secondary | ICD-10-CM

## 2013-07-06 DIAGNOSIS — Y9210 Unspecified residential institution as the place of occurrence of the external cause: Secondary | ICD-10-CM | POA: Insufficient documentation

## 2013-07-06 DIAGNOSIS — Z8742 Personal history of other diseases of the female genital tract: Secondary | ICD-10-CM | POA: Insufficient documentation

## 2013-07-06 DIAGNOSIS — S0100XA Unspecified open wound of scalp, initial encounter: Secondary | ICD-10-CM | POA: Insufficient documentation

## 2013-07-06 DIAGNOSIS — M810 Age-related osteoporosis without current pathological fracture: Secondary | ICD-10-CM | POA: Insufficient documentation

## 2013-07-06 DIAGNOSIS — Z8709 Personal history of other diseases of the respiratory system: Secondary | ICD-10-CM | POA: Insufficient documentation

## 2013-07-06 DIAGNOSIS — S0990XA Unspecified injury of head, initial encounter: Secondary | ICD-10-CM

## 2013-07-06 DIAGNOSIS — Z862 Personal history of diseases of the blood and blood-forming organs and certain disorders involving the immune mechanism: Secondary | ICD-10-CM | POA: Insufficient documentation

## 2013-07-06 DIAGNOSIS — Z8659 Personal history of other mental and behavioral disorders: Secondary | ICD-10-CM | POA: Insufficient documentation

## 2013-07-06 DIAGNOSIS — IMO0002 Reserved for concepts with insufficient information to code with codable children: Secondary | ICD-10-CM | POA: Insufficient documentation

## 2013-07-06 DIAGNOSIS — S139XXA Sprain of joints and ligaments of unspecified parts of neck, initial encounter: Secondary | ICD-10-CM | POA: Insufficient documentation

## 2013-07-06 DIAGNOSIS — W07XXXA Fall from chair, initial encounter: Secondary | ICD-10-CM | POA: Insufficient documentation

## 2013-07-06 DIAGNOSIS — S161XXA Strain of muscle, fascia and tendon at neck level, initial encounter: Secondary | ICD-10-CM

## 2013-07-06 DIAGNOSIS — E669 Obesity, unspecified: Secondary | ICD-10-CM | POA: Insufficient documentation

## 2013-07-06 DIAGNOSIS — Z8639 Personal history of other endocrine, nutritional and metabolic disease: Secondary | ICD-10-CM | POA: Insufficient documentation

## 2013-07-06 DIAGNOSIS — Z872 Personal history of diseases of the skin and subcutaneous tissue: Secondary | ICD-10-CM | POA: Insufficient documentation

## 2013-07-06 LAB — CBC WITH DIFFERENTIAL/PLATELET
Basophils Absolute: 0 10*3/uL (ref 0.0–0.1)
Basophils Relative: 0 % (ref 0–1)
Eosinophils Absolute: 0.1 10*3/uL (ref 0.0–0.7)
Hemoglobin: 13.2 g/dL (ref 12.0–15.0)
MCH: 31.2 pg (ref 26.0–34.0)
MCHC: 34.7 g/dL (ref 30.0–36.0)
Monocytes Relative: 16 % — ABNORMAL HIGH (ref 3–12)
Neutrophils Relative %: 67 % (ref 43–77)
RDW: 12.3 % (ref 11.5–15.5)

## 2013-07-06 LAB — BASIC METABOLIC PANEL
BUN: 8 mg/dL (ref 6–23)
Creatinine, Ser: 0.51 mg/dL (ref 0.50–1.10)
GFR calc Af Amer: >90 mL/min (ref >90)
GFR calc non Af Amer: >90 mL/min (ref >90)
Potassium: 4 mEq/L (ref 3.5–5.1)

## 2013-07-06 NOTE — ED Provider Notes (Signed)
CSN: [id number]     Arrival date & time 07/06/13  0907 History     First MD Initiated Contact with Patient 07/06/13 [phone]     Chief Complaint  Patient presents with  . Head Injury   (Consider location/radiation/quality/duration/timing/severity/associated sxs/prior Treatment) HPI Comments: Patient a resident of group home.  This morning fell backward while getting out of a chair and struck the back of her head on the ground.  She has a laceration to the back of her head that is bleeding briskly.    Patient is a 62 y.o. female presenting with head injury. The history is provided by the patient.  Head Injury Location:  Occipital Mechanism of injury: fall   Pain details:    Quality:  Sharp   Severity:  Moderate   Timing:  Constant   Progression:  Unchanged Chronicity:  New Relieved by:  Nothing Worsened by:  Nothing tried Ineffective treatments:  None tried Associated symptoms: headache   Associated symptoms: no blurred vision and no difficulty breathing     Past Medical History  Diagnosis Date  . Allergic rhinitis   . Obesity   . Menopause   . Moderate mental retardation   . Seizures     focal-being by Dr. Sandria Manly (should not take anti-histamines)  . Discoid lupus   . Alopecia   . Abnormal WBC count     low chronic WBC (2-3K)  . Hyperlipidemia   . Osteoporosis   . Pulmonary nodules    Past Surgical History  Procedure Laterality Date  . Vesicovaginal fistula closure w/ tah     Family History  Problem Relation Age of Onset  . Breast cancer Mother    History  Substance Use Topics  . Smoking status: Never Smoker   . Smokeless tobacco: Never Used  . Alcohol Use: No   OB History   Grav Para Term Preterm Abortions TAB SAB Ect Mult Living                 Review of Systems  Eyes: Negative for blurred vision.  Neurological: Positive for headaches.  All other systems reviewed and are negative.    Allergies  Review of patient's allergies indicates no known  allergies.  Home Medications   Current Outpatient Rx  Name  Route  Sig  Dispense  Refill  . busPIRone (BUSPAR) 15 MG tablet   Oral   Take 15 mg by mouth 2 (two) times daily.           . calcium carbonate (OS-CAL) 600 MG TABS   Oral   Take 600 mg by mouth 3 (three) times daily with meals.           . carbamazepine (TEGRETOL XR) 400 MG 12 hr tablet   Oral   Take 400 mg by mouth. 1 in the morning and 2 at bedtime          . dexlansoprazole (DEXILANT) 60 MG capsule   Oral   Take 60 mg by mouth daily.           . Diphenhyd-Hydrocort-Nystatin SUSP   Mouth/Throat   Use as directed 5 mLs in the mouth or throat 4 (four) times daily. For 5 days. Swish in the mouth and retain for as long as possible before swallowing   120 mL   0   . fluticasone (FLONASE) 50 MCG/ACT nasal spray   Nasal   Place 2 sprays into the nose daily.   16 g   6   .  gabapentin (NEURONTIN) 300 MG capsule   Oral   Take 300 mg by mouth 3 (three) times daily.           . Hypertonic Nasal Wash SOLN      3% hypertonic nasal saline spray, 2 sprays each nostril daily   1 Bottle   0   . ibuprofen (ADVIL,MOTRIN) 200 MG tablet   Oral   Take 400 mg by mouth every 6 (six) hours as needed.           . levETIRAcetam (KEPPRA) 500 MG tablet   Oral   Take 500 mg by mouth 2 (two) times daily.           . mometasone (ASMANEX 60 METERED DOSES) 220 MCG/INH inhaler   Inhalation   Inhale 2 puffs into the lungs daily.   1 Inhaler   12   . Multiple Vitamins-Minerals (CENTROVITE) TABS   Oral   Take 1 tablet by mouth daily.           . pimecrolimus (ELIDEL) 1 % cream   Topical   Apply topically as directed.           . Selenium 200 MCG TABS   Oral   Take 1 tablet by mouth daily.           . vitamin D, CHOLECALCIFEROL, 400 UNITS tablet   Oral   Take 400 Units by mouth daily.           . Wheat Dextrin (BENEFIBER) CHEW   Oral   Chew 1 tablet by mouth 2 (two) times daily.            BP  169/86  Pulse 87  Temp(Src) 97.5 F (36.4 C) (Oral)  Resp 20  SpO2 97% Physical Exam  Nursing note and vitals reviewed. Constitutional: She is oriented to person, place, and time. She appears well-developed and well-nourished. No distress.  HENT:  There is swelling and a 2.5 cm laceration to the left of the occipital region.  There is arterial bleeding coming from the wound with extensive clotting and matting of the hair.   Eyes: EOM are normal. Pupils are equal, round, and reactive to light.  Neck: Normal range of motion. Neck supple.  Cardiovascular: Normal rate and regular rhythm.   No murmur heard. Pulmonary/Chest: Effort normal and breath sounds normal.  Abdominal: Soft. Bowel sounds are normal.  Musculoskeletal: Normal range of motion. She exhibits no edema.  Neurological: She is alert and oriented to person, place, and time. No cranial nerve deficit. She exhibits normal muscle tone. Coordination normal.  Skin: Skin is warm and dry. She is not diaphoretic.    ED Course   Procedures (including critical care time)  Labs Reviewed - No data to display No results found. No diagnosis found.  MDM  The patient presents after a fall with arterial bleeding from an occipital scalp laceration.  Bleeding was controlled with pressure, but extensive matting of the hair made the laceration difficult to locate.  I was able to evacuate some of the clot which then resulted in recurrent arterial bleeding.  I placed 5 staples which approximated the edges of the wound and ultimately tamponaded the bleeding.  Her Hb was checked and was stable.  She seems appropriate for discharge.  Staples out in 5 days.  Geoffery Lyons, MD 07/06/13 1057

## 2013-07-06 NOTE — ED Notes (Signed)
Pt from group home c/o head injury with no LOC. Pt reports that she got dizzy trying to get out of a chair, falling and hitting her head on floor. Pt is not on blood thinners. Pt is A&O per her normal. Pt in NAD and denies pain, just states her head is sore.

## 2013-07-08 ENCOUNTER — Telehealth: Payer: Self-pay | Admitting: Neurology

## 2013-07-08 NOTE — Telephone Encounter (Signed)
Transfer of Care Dr. Love needs to be reassigned per Dr. Athar °

## 2013-07-09 NOTE — Telephone Encounter (Signed)
I was able to r/s patient on another day, Disregard reassigning. Thanks

## 2013-07-28 ENCOUNTER — Telehealth: Payer: Self-pay | Admitting: Neurology

## 2013-07-28 NOTE — Telephone Encounter (Signed)
Called Nicole [name] he stated she has enough medication for the month. He just needs a copy of the script for his files. The insurance company will not pay for brand name keppra 500 MG needs generic script.

## 2013-07-28 NOTE — Telephone Encounter (Signed)
Per Dr. Frances Furbish ok for patient to take generic Keppra 500 MG two times a day.

## 2013-07-30 ENCOUNTER — Telehealth: Payer: Self-pay | Admitting: Neurology

## 2013-07-30 NOTE — Telephone Encounter (Signed)
I have gotten the prior auth approved through ins for the patient to remain on Brand Keppra.  Approval is effective 07/29/2013-12/03/2013.  I faxed a copy of the approval letter to the pharmacy and to [employer].  The pharmacy should be able to continue filling the med as they have been without delay.

## 2013-08-11 ENCOUNTER — Ambulatory Visit: Payer: Self-pay | Admitting: Neurology

## 2013-08-13 ENCOUNTER — Ambulatory Visit (INDEPENDENT_AMBULATORY_CARE_PROVIDER_SITE_OTHER): Payer: Medicare Other | Admitting: Neurology

## 2013-08-13 ENCOUNTER — Encounter: Payer: Self-pay | Admitting: Neurology

## 2013-08-13 VITALS — BP 149/83 | HR 73 | Temp 97.7°F | Ht 66.0 in | Wt 184.0 lb

## 2013-08-13 DIAGNOSIS — G808 Other cerebral palsy: Secondary | ICD-10-CM

## 2013-08-13 DIAGNOSIS — G9349 Other encephalopathy: Secondary | ICD-10-CM

## 2013-08-13 DIAGNOSIS — R269 Unspecified abnormalities of gait and mobility: Secondary | ICD-10-CM

## 2013-08-13 DIAGNOSIS — E871 Hypo-osmolality and hyponatremia: Secondary | ICD-10-CM

## 2013-08-13 DIAGNOSIS — Q079 Congenital malformation of nervous system, unspecified: Secondary | ICD-10-CM

## 2013-08-13 DIAGNOSIS — R296 Repeated falls: Secondary | ICD-10-CM

## 2013-08-13 DIAGNOSIS — L659 Nonscarring hair loss, unspecified: Secondary | ICD-10-CM

## 2013-08-13 DIAGNOSIS — Z9181 History of falling: Secondary | ICD-10-CM

## 2013-08-13 DIAGNOSIS — G40109 Localization-related (focal) (partial) symptomatic epilepsy and epileptic syndromes with simple partial seizures, not intractable, without status epilepticus: Secondary | ICD-10-CM

## 2013-08-13 DIAGNOSIS — D72819 Decreased white blood cell count, unspecified: Secondary | ICD-10-CM

## 2013-08-13 NOTE — Progress Notes (Signed)
Subjective:    Patient ID: Nicole [name] is a 62 y.o. female.  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 She is accompanied by her caregiver, Arlys John again today and she resides in a group home. She previously followed with Dr. Fayrene Fearing love and I first met her on 02/06/2013, at which time I checked her sodium level. She has had increasing falls. 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 She has a history of static encephalopathy. She has had hyponatremia repetitively the past. I proceeded with a chest x-ray because I had heard some crackles on auscultation. We check blood work which did show hyponatremia and adjusted her medication. She was recently seen in the emergency room on 07/06/2013 after a fall with head injury. She had a laceration to the back of her head. She received 5 staples. She had a head and cervical spine CT on 07/06/13: Stable ventriculomegaly and periventricular calcifications. Ventriculomegaly felt to most likely be related to white matter disease and volume loss. No acute findings. Degenerative changes and cervical straightening. No acute bony abnormality. Arlys John feels that she is overall stable. She has no new complaints today. She mobilizes with a rolling walker. She takes brand-name Keppra twice daily 500 mg strength. She takes Tegretol-XR 400 mg in the day and 800 mg at night.   Her Past Medical History Is Significant For: Past Medical History  Diagnosis Date  . Allergic rhinitis   . Obesity   . Menopause   . Moderate mental retardation   . Seizures     focal-being by Dr. Sandria Manly (should not take anti-histamines)  . Discoid lupus   . Alopecia   . Abnormal WBC count      low chronic WBC (2-3K)  . Hyperlipidemia   . Osteoporosis   . Pulmonary nodules     Her Past Surgical History Is Significant For: Past Surgical History  Procedure Laterality Date  . Vesicovaginal fistula closure w/ tah      Her Family History Is Significant For: Family History  Problem Relation Age of Onset  . Breast cancer Mother     Her Social History Is Significant For: History   Social History  . Marital Status: Single    Spouse Name: N/A    Number of Children: N/A  . Years of Education: N/A   Occupational History  . 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 Smoking status: Never Smoker   . Smokeless tobacco: Never Used  . Alcohol Use: No  . Drug Use: No  . Sexual Activity: No   Other Topics Concern  . None   Social History Narrative   [facility] [phone]    Her Allergies Are:  No Known Allergies:   Her Current Medications Are:  Outpatient Encounter Prescriptions as of 08/13/2013  Medication Sig Dispense Refill  . azelastine (ASTELIN) 137 MCG/SPRAY nasal spray Place 1 spray into the nose at bedtime. Use in each nostril as directed      . beclomethasone (QVAR) 80 MCG/ACT inhaler 1 puff. Inhale 1 puff into the lungs 2 (two) times daily.      . busPIRone (BUSPAR) 15 MG tablet  Take 15 mg by mouth 2 (two) times daily.        . calcium carbonate (OS-CAL) 600 MG TABS tablet Take by mouth.      . carbamazepine (TEGRETOL XR) 400 MG 12 hr tablet Take 400-800 mg by mouth 2 (two) times daily. 1 in the morning and 2 at bedtime      . cholecalciferol (VITAMIN D) 400 UNITS TABS Take 400 Units by mouth as directed. Taken tid on weekends, taken qid on weekdays.      . ciclopirox (PENLAC) 8 % solution Apply 1 application topically at bedtime. Apply over nail and surrounding skin. Apply daily over previous coat. After seven (7) days, may remove with alcohol and continue cycle.      Marland Kitchen dexlansoprazole  (DEXILANT) 60 MG capsule Take 60 mg by mouth daily.        . fluticasone (CUTIVATE) 0.005 % ointment Apply 1 application topically daily as needed (for rash; applied to affected area).      . fluticasone (FLONASE) 50 MCG/ACT nasal spray Place 2 sprays into the nose daily.  16 g  6  . gabapentin (NEURONTIN) 300 MG capsule Take 300 mg by mouth as directed. Taken tid on weekends, taken qid on weekdays.      Marland Kitchen ibuprofen (ADVIL,MOTRIN) 200 MG tablet Take 200 mg by mouth as needed for pain.      Marland Kitchen levETIRAcetam (KEPPRA) 500 MG tablet Take 500 mg by mouth 2 (two) times daily.        . mometasone (ASMANEX 60 METERED DOSES) 220 MCG/INH inhaler Inhale 2 puffs into the lungs daily.  1 Inhaler  12  . Multiple Vitamin (MULTIVITAMIN WITH MINERALS) TABS Take 1 tablet by mouth daily.      . pantoprazole (PROTONIX) 40 MG tablet       . Selenium 200 MCG TBCR Take 1 tablet by mouth daily.      Marland Kitchen terbinafine (LAMISIL) 250 MG tablet Take 250 mg by mouth every morning.      . Wheat Dextrin (BENEFIBER) CHEW Chew 1 tablet by mouth 2 (two) times daily.        . [DISCONTINUED] azelastine (ASTELIN) 137 MCG/SPRAY nasal spray Place 2 sprays into the nose daily. Use in each nostril as directed  30 mL  12  . [DISCONTINUED] fluticasone (FLONASE) 50 MCG/ACT nasal spray by Nasal route.      . [DISCONTINUED] busPIRone (BUSPAR) 15 MG tablet Take by mouth.      . [DISCONTINUED] calcium carbonate (OS-CAL) 600 MG TABS Take 600 mg by mouth 3 (three) times daily with meals.        . [DISCONTINUED] carbamazepine (TEGRETOL XR) 400 MG 12 hr tablet Take by mouth.      . [DISCONTINUED] cholecalciferol (VITAMIN D) 400 UNITS TABS tablet Take by mouth.      . [DISCONTINUED] gabapentin (NEURONTIN) 300 MG capsule Take by mouth.      . [DISCONTINUED] Selenium 200 MCG TBCR Take by mouth.      . [DISCONTINUED] terbinafine (LAMISIL) 250 MG tablet Take by mouth.       No facility-administered encounter medications on file as of 08/13/2013.   Review  of Systems  Skin: Positive for rash.  Allergic/Immunologic:       Seasonal allergies    Objective:  Neurologic Exam  Physical Exam Physical Examination:   Filed Vitals:   08/13/13 1140  BP: 149/83  Pulse: 73  Temp: 97.7 F (36.5 C)    General Examination: The  patient is a very pleasant 62 y.o. female in no acute distress. She appears well-developed and well-nourished and adequately groomed. She brought in her rolling walker.  HEENT: She has loss of hair, she has a scar over the top of her head. She has a healing small scar on her right eyebrow, she has a fresh scar on the L back of her head. Oropharynx is clear without erythema. Neck is supple with normal range of motion. She has normal facial animation. 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 Extraocular tracking is poor. She is mildly dysarthric and mildly hypophonic. Chest auscultation reveals mild expiratory wheeze and she had a bout of coughing. No crackles auscultated.  Heart sounds are normal without murmurs, rubs or gallops. Abdomen is soft, nontender with normal bowel sounds.  She has no pitting edema in the distal lower extremities. Her left leg has a smaller caliber than her right leg. Motor exam: Tone is mildly spastic in the left upper and left lower extremity. She has mild weakness on the left side right side is normal. Reflexes are 2+ on the right and 3+ on the left. Cerebellar testing is difficult because she has trouble following complex commands. She does have mild discoordination on the left. Sensory exam is also difficult to assess but she does have decreased light touch and pinprick sensation on the left hemibody. She does not need help with standing up and has a slightly wide-based stance. She has left circumduction while walking and flexes her left arm and wrist while walking. She maneuvers the walker well.         Assessment and Plan:   In summary, Ms. Seevers is a 62 year old lady with a complex history including congenital  left hemiparesis, seizure disorder, static encephalopathy and known hyponatremia as well as low white count secondary to chronic antiepileptic therapy. She has had a recent increase in her falls. She has injured herself as well. I explained to the caretaker Arlys John as well as her today that with time she will probably get worse with her gait and her balance. 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 I will repeat her sodium level and white count as well as Keppra and Tegretol levels. We will call them with the test results. She is advised to use the walker at all times. Her exam is overall stable. I will see her back routinely in about 6 months, sooner if the need arises. They were in agreement.

## 2013-08-13 NOTE — Patient Instructions (Signed)
I think overall you are doing fairly well but I do want to suggest a few things today:  Continue to drink tomato juice and gatorade.   As far as your medications are concerned, I would like to suggest no changes.    As far as diagnostic testing: labs today.  I would like to see you back in 6 months, sooner if we need to. Please call us with any interim questions, concerns, problems, updates or refill requests.  Please also call us for any test results so we can go over those with you on the phone. Brett Canales is my clinical assistant and will answer any of your questions and relay your messages to me and also relay most of my messages to you.  Our phone number is [phone]. We also have an after hours call service for urgent matters and there is a physician on-call for urgent questions. For any emergencies you know to call 911 or go to the nearest emergency room.

## 2013-08-15 ENCOUNTER — Telehealth: Payer: Self-pay

## 2013-08-15 LAB — CBC WITH DIFFERENTIAL
Basophils Absolute: 0 10*3/uL (ref 0.0–0.2)
Immature Granulocytes: 0 % (ref 0–2)
Lymphocytes Absolute: 0.5 10*3/uL — ABNORMAL LOW (ref 0.7–3.1)
MCH: 31.1 pg (ref 26.6–33.0)
MCV: 90 fL (ref 79–97)
Monocytes Absolute: 0.4 10*3/uL (ref 0.1–0.9)
Platelets: 276 10*3/uL (ref 150–379)
RDW: 12.8 % (ref 12.3–15.4)
WBC: 2.7 10*3/uL — ABNORMAL LOW (ref 3.4–10.8)

## 2013-08-15 LAB — COMPREHENSIVE METABOLIC PANEL
AST: 16 IU/L (ref 0–40)
Alkaline Phosphatase: 123 IU/L — ABNORMAL HIGH (ref 39–117)
BUN/Creatinine Ratio: 14 (ref 11–26)
CO2: 24 mmol/L (ref 18–29)
Chloride: 86 mmol/L — ABNORMAL LOW (ref 97–108)
Sodium: 128 mmol/L — ABNORMAL LOW (ref 134–144)

## 2013-08-15 LAB — CARBAMAZEPINE LEVEL, TOTAL: Carbamazepine Lvl: 17.4 ug/mL (ref 4.0–12.0)

## 2013-08-15 LAB — LEVETIRACETAM LEVEL: Levetiracetam Lvl: 8.3 ug/mL (ref 5.0–63.0)

## 2013-08-15 NOTE — Telephone Encounter (Signed)
I called and spoke with Nicole [name]. He took down the information that I shared from Dr. Frances Furbish. Nicole [name] is asking that we fax him an order for the reduced Tegretol XR to 400 mg two times a day. Also, he would like to start that on Tuesday, August 19, 2013, if that would be okay, as patient is away at camp until then.   He understands to come for follow up tegretol level two weeks after tegretol in reduced.

## 2013-08-15 NOTE — Telephone Encounter (Signed)
Can you put that on a Rx pad for me?  Change Tegretol XR 400 mg to 1 PO bid.  Start on 08/19/13 Tegretol level to be drawn on: 09/02/13.

## 2013-08-15 NOTE — Progress Notes (Signed)
Quick Note:  Please call patient's caretaker from her group home, Arlys John, and report that her sodium and chloride levels are stable and actually perhaps a little bit better than before. Please also report that her white count is low but stable at 2.7. However, her Tegretol level was high, her Keppra level was within therapeutic range. At this point I would like for her to reduce her Tegretol-XR to 400 mg twice daily as opposed to taking 400 mg in the morning and 800 mg at night. Please have her take 400 mg twice daily for the next 2 weeks and we need to recheck her level after 2 weeks. I will order a Tegretol level. Please remind him to bring her in the afternoon so she has taken her morning dose but not her evening dose yet. Huston Foley, MD, PhD [facility] ([facility]  ______

## 2013-08-15 NOTE — Telephone Encounter (Signed)
Message copied by [facility] on Fri Aug 15, 2013  2:33 PM ------      Message from: Huston Foley      Created: Fri Aug 15, 2013  2:14 PM       Please call patient's caretaker from her group home, Arlys John, and report that her sodium and chloride levels are stable and actually perhaps a little bit better than before. Please also report that her white count is low but stable at 2.7. However, her Tegretol level was high, her Keppra level was within therapeutic range. At this point I would like for her to reduce her Tegretol-XR to 400 mg twice daily as opposed to taking 400 mg in the morning and 800 mg at night. Please have her take 400 mg twice daily for the next 2 weeks and we need to recheck her level after 2 weeks. I will order a Tegretol level. Please remind him to bring her in the afternoon so she has taken her morning dose but not her evening dose yet.      Huston Foley, MD, PhD      [facility])       ------

## 2013-08-15 NOTE — Telephone Encounter (Signed)
Done. In your office.

## 2013-08-15 NOTE — Telephone Encounter (Signed)
Rx updated on med list

## 2013-08-15 NOTE — Addendum Note (Signed)
Addended by: Huston Foley on: 08/15/2013 02:15 PM   Modules accepted: Orders

## 2013-09-01 ENCOUNTER — Ambulatory Visit: Payer: Medicare Other | Admitting: Physical Therapy

## 2013-09-02 ENCOUNTER — Other Ambulatory Visit (INDEPENDENT_AMBULATORY_CARE_PROVIDER_SITE_OTHER): Payer: Medicare Other

## 2013-09-02 ENCOUNTER — Other Ambulatory Visit: Payer: Self-pay | Admitting: *Deleted

## 2013-09-02 DIAGNOSIS — E871 Hypo-osmolality and hyponatremia: Secondary | ICD-10-CM

## 2013-09-02 DIAGNOSIS — G40109 Localization-related (focal) (partial) symptomatic epilepsy and epileptic syndromes with simple partial seizures, not intractable, without status epilepticus: Secondary | ICD-10-CM

## 2013-09-02 DIAGNOSIS — Z9181 History of falling: Secondary | ICD-10-CM

## 2013-09-02 DIAGNOSIS — Z0289 Encounter for other administrative examinations: Secondary | ICD-10-CM

## 2013-09-02 NOTE — Addendum Note (Signed)
Addended byHermenia Fiscal on: 09/02/2013 03:41 PM   Modules accepted: Orders

## 2013-09-03 LAB — BASIC METABOLIC PANEL
Chloride: 89 mmol/L — ABNORMAL LOW (ref 97–108)
GFR calc Af Amer: 121 mL/min/{1.73_m2} (ref >59)
Glucose: 107 mg/dL — ABNORMAL HIGH (ref 65–99)
Potassium: 4.2 mmol/L (ref 3.5–5.2)
Sodium: 132 mmol/L — ABNORMAL LOW (ref 134–144)

## 2013-09-03 LAB — CBC
Hemoglobin: 12.1 g/dL (ref 11.1–15.9)
MCH: 31.1 pg (ref 26.6–33.0)
MCHC: 34.1 g/dL (ref 31.5–35.7)
Platelets: 252 10*3/uL (ref 150–379)

## 2013-09-03 NOTE — Progress Notes (Signed)
Quick Note:  Please call patient caretaker, Arlys John, and have him continue her medications the way we have. Her carbamazepine level is within therapeutic range and her white count and her sodium levels are better than what we have seen in the recent past. This means, continue what we're doing. Huston Foley, MD, PhD [facility] ([facility]  ______

## 2013-09-03 NOTE — Progress Notes (Signed)
Quick Note:  Spoke to caretaker Arlys John and relayed lab results and to continue her medications as they are now, per Dr. Frances Furbish. He expressed understanding. ______

## 2013-09-04 ENCOUNTER — Ambulatory Visit: Payer: Medicare Other | Attending: Physician Assistant | Admitting: Physical Therapy

## 2013-09-04 DIAGNOSIS — Z9181 History of falling: Secondary | ICD-10-CM | POA: Insufficient documentation

## 2013-09-04 DIAGNOSIS — R5381 Other malaise: Secondary | ICD-10-CM | POA: Insufficient documentation

## 2013-09-04 DIAGNOSIS — R269 Unspecified abnormalities of gait and mobility: Secondary | ICD-10-CM | POA: Insufficient documentation

## 2013-09-04 DIAGNOSIS — IMO0001 Reserved for inherently not codable concepts without codable children: Secondary | ICD-10-CM | POA: Insufficient documentation

## 2013-09-11 ENCOUNTER — Ambulatory Visit: Payer: Medicare Other | Admitting: Physical Therapy

## 2013-09-18 ENCOUNTER — Ambulatory Visit: Payer: Medicare Other | Admitting: Physical Therapy

## 2013-09-19 ENCOUNTER — Other Ambulatory Visit: Payer: Self-pay

## 2013-09-19 MED ORDER — LEVETIRACETAM 500 MG PO TABS: 500.0000 mg | ORAL_TABLET | Freq: Two times a day (BID) | ORAL | Status: DC

## 2013-09-25 ENCOUNTER — Ambulatory Visit: Payer: Medicare Other | Admitting: Physical Therapy

## 2013-09-30 ENCOUNTER — Other Ambulatory Visit: Payer: Self-pay | Admitting: *Deleted

## 2013-09-30 MED ORDER — GABAPENTIN 300 MG PO CAPS: 300.0000 mg | ORAL_CAPSULE | ORAL | Status: DC

## 2013-10-02 ENCOUNTER — Ambulatory Visit: Payer: Medicare Other | Admitting: Physical Therapy

## 2013-10-09 ENCOUNTER — Ambulatory Visit: Payer: Medicare Other | Attending: Physician Assistant | Admitting: Physical Therapy

## 2013-10-09 DIAGNOSIS — R269 Unspecified abnormalities of gait and mobility: Secondary | ICD-10-CM | POA: Insufficient documentation

## 2013-10-09 DIAGNOSIS — R5381 Other malaise: Secondary | ICD-10-CM | POA: Insufficient documentation

## 2013-10-09 DIAGNOSIS — IMO0001 Reserved for inherently not codable concepts without codable children: Secondary | ICD-10-CM | POA: Insufficient documentation

## 2013-10-09 DIAGNOSIS — Z9181 History of falling: Secondary | ICD-10-CM | POA: Insufficient documentation

## 2013-10-16 ENCOUNTER — Ambulatory Visit: Payer: Medicare Other | Admitting: Physical Therapy

## 2013-10-23 ENCOUNTER — Ambulatory Visit: Payer: Medicare Other | Admitting: Physical Therapy

## 2013-10-27 ENCOUNTER — Encounter: Payer: Medicare Other | Admitting: Physical Therapy

## 2013-10-28 ENCOUNTER — Telehealth: Payer: Self-pay | Admitting: *Deleted

## 2013-10-28 NOTE — Telephone Encounter (Signed)
There is already an authorization approved, in place until 12/03/2013.  I have resubmitted the paperwork, but do not know if they will review it yet since the PA is valid until the end of the year.  If further info is needed, they will contact our office.

## 2013-10-28 NOTE — Telephone Encounter (Signed)
Daniel from [employer] called and relayed that needs medical necessity letter stating that pt has continued on Brand Keppra from 2008 due to medical necessity.  Also going forward will need hard copy.  Looking  Back on centricity and epic I do not see this.   I will check with Shanda Bumps in pharmacy.  Pt has received brand name the last 6 mo states Daniel.

## 2014-02-10 ENCOUNTER — Ambulatory Visit: Payer: Medicare Other | Admitting: Neurology

## 2014-07-13 ENCOUNTER — Other Ambulatory Visit: Payer: Self-pay | Admitting: Family Medicine

## 2014-07-13 DIAGNOSIS — Z1231 Encounter for screening mammogram for malignant neoplasm of breast: Secondary | ICD-10-CM

## 2014-07-30 ENCOUNTER — Ambulatory Visit
Admission: RE | Admit: 2014-07-30 | Discharge: 2014-07-30 | Disposition: A | Discharge status: Home / Self Care | Payer: Medicare Other | Source: Ambulatory Visit | Attending: Family Medicine | Admitting: Family Medicine

## 2014-07-30 DIAGNOSIS — Z1231 Encounter for screening mammogram for malignant neoplasm of breast: Secondary | ICD-10-CM

## 2014-07-31 ENCOUNTER — Other Ambulatory Visit: Payer: Self-pay | Admitting: Family Medicine

## 2014-07-31 DIAGNOSIS — R928 Other abnormal and inconclusive findings on diagnostic imaging of breast: Secondary | ICD-10-CM

## 2014-08-18 ENCOUNTER — Ambulatory Visit
Admission: RE | Admit: 2014-08-18 | Discharge: 2014-08-18 | Disposition: A | Discharge status: Home / Self Care | Payer: Medicare Other | Source: Ambulatory Visit | Attending: Family Medicine | Admitting: Family Medicine

## 2014-08-18 DIAGNOSIS — R928 Other abnormal and inconclusive findings on diagnostic imaging of breast: Secondary | ICD-10-CM

## 2014-12-21 ENCOUNTER — Ambulatory Visit (INDEPENDENT_AMBULATORY_CARE_PROVIDER_SITE_OTHER): Payer: Medicare Other | Admitting: Neurology

## 2014-12-21 ENCOUNTER — Encounter: Payer: Self-pay | Admitting: Neurology

## 2014-12-21 VITALS — BP 130/80 | HR 76 | Temp 96.5°F | Ht 66.0 in | Wt 190.0 lb

## 2014-12-21 DIAGNOSIS — D72819 Decreased white blood cell count, unspecified: Secondary | ICD-10-CM

## 2014-12-21 DIAGNOSIS — G808 Other cerebral palsy: Secondary | ICD-10-CM

## 2014-12-21 DIAGNOSIS — Q079 Congenital malformation of nervous system, unspecified: Secondary | ICD-10-CM

## 2014-12-21 DIAGNOSIS — G40909 Epilepsy, unspecified, not intractable, without status epilepticus: Secondary | ICD-10-CM

## 2014-12-21 DIAGNOSIS — E871 Hypo-osmolality and hyponatremia: Secondary | ICD-10-CM

## 2014-12-21 DIAGNOSIS — G9349 Other encephalopathy: Secondary | ICD-10-CM

## 2014-12-21 MED ORDER — LEVETIRACETAM 500 MG PO TABS: 500.0000 mg | ORAL_TABLET | Freq: Two times a day (BID) | ORAL | Status: DC

## 2014-12-21 MED ORDER — CARBAMAZEPINE ER 400 MG PO TB12: 400.0000 mg | ORAL_TABLET | Freq: Two times a day (BID) | ORAL | Status: DC

## 2014-12-21 NOTE — Patient Instructions (Signed)
  Please remember, common seizure triggers are: Sleep deprivation, dehydration, overheating, stress, hypoglycemia or skipping meals, certain medications or excessive alcohol use, especially stopping alcohol abruptly if you have had heavy alcohol use before (aka alcohol withdrawal seizure). If you have a prolonged seizure over 2-5 minutes or back to back seizures, call or have someone call 911 or take you to the nearest emergency room. You cannot drive a car or operate any other machinery or vehicle within 6 months of a seizure. Please do not swim alone or take a tub bath for safety. Do not cook with large quantities of boiling water or oil for safety. Take your medicine for seizure prevention regularly and do not skip doses or stop medication abruptly and tone are told to do so by your healthcare provider. Try to get a refill on your antiepileptic medication ahead of time, so you are not at risk of running out. If you run out of the seizure medication and do not have a refill at hand she may run into medication withdrawal seizures. Avoid taking Wellbutrin, narcotic pain medications and tramadol, as they can lower seizure threshold.   We will continue meds, see if we can get your insurance to continue coverage for Keppra. We will do blood work today and call Nicole [name] [phone]), and we will do a 6 month check up routinely.

## 2014-12-21 NOTE — Progress Notes (Signed)
Subjective:    Patient ID: Nicole [name] is a 64 y.o. female.  HPI     Interim history:  Nicole [name] is a 64 year old right-handed woman with an underlying medical history of allergic rhinitis, obesity, discoid lupus, alopecia, hyperlipidemia, pulmonary nodules, and osteoporosis, who presents for followup consultation of her gait disorder, congenital left hemiparesis as well as epilepsy since infancy. She is accompanied by her caregiver, Nicole [name] again today and she resides in a group home. I last saw her on 08/13/2013, at which time her caregiver reported that she was stable. She was using a rolling walker. I checked blood work and we called her group home with the results. Her sodium and chloride levels were stable. Her white count was low but stable at 2.7. Keppra level was within therapeutic range and Tegretol level was high so I decreased long-acting Tegretol to 400 mg twice daily as opposed to 400 in the morning and 800 and night. Keppra stayed at 500 mg twice daily. Of note, she no showed for an appointment on 02/10/2014.  Today, she says, she feels well. Nicole [name] states, there have been no seizures. Her insurance will no longer pay for the [place]. She has been on 500 mg bid, she has been Tegretol XR 400 mg bid. She has had no recent falls. She uses her rolling walker.  She previously followed with Dr. Jeneen [name] love and I first met her on 02/06/2013, at which time I checked her sodium level. She has had increasing falls. 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 She has a history of static encephalopathy. She has had hyponatremia repetitively the past. I proceeded with a chest x-ray because I had heard some crackles on auscultation. We check blood work which did show hyponatremia and adjusted her medication. She was recently seen in the emergency room on  07/06/2013 after a fall with head injury. She had a laceration to the back of her head. She received 5 staples. She had a head and cervical spine CT on 07/06/13: Stable ventriculomegaly and periventricular calcifications. Ventriculomegaly felt to most likely be related to white matter disease and volume loss. No acute findings. Degenerative changes and cervical straightening. No acute bony abnormality.   Her Past Medical History Is Significant For: Past Medical History  Diagnosis Date  . Allergic rhinitis   . Obesity   . Menopause   . Moderate mental retardation   . Seizures     focal-being by Dr. Erling [name] (should not take anti-histamines)  . Discoid lupus   . Alopecia   . Abnormal WBC count     low chronic WBC (2-3K)  . Hyperlipidemia   . Osteoporosis   . Pulmonary nodules   . Hypertension     Her Past Surgical History Is Significant For: Past Surgical History  Procedure Laterality Date  . Vesicovaginal fistula closure w/ tah      Her Family History Is Significant For: Family History  Problem Relation Age of Onset  . Breast cancer Mother     Her Social History Is Significant For: History   Social History  . Marital Status: Single    Spouse Name: N/A    Number of Children: N/A  . Years of Education: N/A   Occupational History  . 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  Smoking status: Never Smoker   . Smokeless tobacco: Never Used  . Alcohol Use: No  . Drug Use: No  . Sexual Activity: No   Other Topics Concern  . None   Social History Narrative   [facility] [phone]    Her Allergies Are:  Allergies  Allergen Reactions  . Seasonal Ic [Cholestatin]     Runny nose  :   Her Current Medications Are:  Outpatient Encounter Prescriptions as of 12/21/2014  Medication Sig  . azelastine (ASTELIN) 137 MCG/SPRAY nasal spray Place 1 spray into the nose at bedtime. Use in each nostril as directed  .  benzonatate (TESSALON) 100 MG capsule Take by mouth daily. Every 6 hours as needed  . busPIRone (BUSPAR) 15 MG tablet Take 15 mg by mouth 2 (two) times daily.    . calcium carbonate (OS-CAL) 600 MG TABS tablet Take by mouth.  . carbamazepine (TEGRETOL XR) 400 MG 12 hr tablet Take 400 mg by mouth 2 (two) times daily. Start on 08/19/2013  . cholecalciferol (VITAMIN D) 400 UNITS TABS Take 400 Units by mouth as directed. Taken tid on weekends, taken qid on weekdays.  . fluticasone (CUTIVATE) 0.005 % ointment Apply 1 application topically daily as needed (for rash; applied to affected area).  . fluticasone (FLONASE) 50 MCG/ACT nasal spray Place 2 sprays into the nose daily.  Marland Kitchen gabapentin (NEURONTIN) 300 MG capsule Take 1 capsule (300 mg total) by mouth as directed. Taken tid on weekends, taken qid on weekdays.  Marland Kitchen levETIRAcetam (KEPPRA) 500 MG tablet Take 1 tablet (500 mg total) by mouth 2 (two) times daily.  . Minoxidil (ROGAINE EX) Apply topically. Apply as directed  . mometasone (ASMANEX 60 METERED DOSES) 220 MCG/INH inhaler Inhale 2 puffs into the lungs daily.  . Multiple Vitamin (MULTIVITAMIN WITH MINERALS) TABS Take 1 tablet by mouth daily.  . Multiple Vitamins-Minerals (CERTAVITE/ANTIOXIDANTS) TABS Take by mouth. Advance , daily  . pantoprazole (PROTONIX) 40 MG tablet   . Selenium 200 MCG TBCR Take 1 tablet by mouth daily.  Marland Kitchen terbinafine (LAMISIL) 250 MG tablet Take 250 mg by mouth every morning.  . Wheat Dextrin (BENEFIBER) CHEW Chew 1 tablet by mouth 2 (two) times daily.    . [DISCONTINUED] beclomethasone (QVAR) 80 MCG/ACT inhaler 1 puff. Inhale 1 puff into the lungs 2 (two) times daily.  . [DISCONTINUED] ciclopirox (PENLAC) 8 % solution Apply 1 application topically at bedtime. Apply over nail and surrounding skin. Apply daily over previous coat. After seven (7) days, may remove with alcohol and continue cycle.  . [DISCONTINUED] dexlansoprazole (DEXILANT) 60 MG capsule Take 60 mg by mouth  daily.    . [DISCONTINUED] ibuprofen (ADVIL,MOTRIN) 200 MG tablet Take 200 mg by mouth as needed for pain.  :  Review of Systems:  Out of a complete 14 point review of systems, all are reviewed and negative with the exception of these symptoms as listed below:   Review of Systems  All other systems reviewed and are negative.   Objective:  Neurologic Exam  Physical Exam Physical Examination:   Filed Vitals:   12/21/14 0857  BP: 130/80  Pulse: 76  Temp: 96.5 F (35.8 C)   General Examination: The patient is a very pleasant 64 y.o. female in no acute distress. She appears well-developed and well-nourished and adequately groomed. She brought in her rolling walker.   HEENT: She has had hair regrowth, otherwise, normocephalic atraumatic, oropharynx is clear. She has mild mouth dryness. Neck is supple with  full range of motion. 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 Extraocular tracking is limited. She has mild dysarthria and mild hypophonia in her speech, all unchanged.  Chest is clear to auscultation without crackles or wheezing or rhonchi.  Heart sounds are normal without murmurs, rubs or gallops.  Abdomen is soft, nontender with normal bowel sounds.  She has no pitting edema in the distal lower extremities.  Neurological exam: Mental status: The patient is awake, alert and oriented in all 3 spheres. Her memory, attention, fund of knowledge are mildly impaired, unchanged from before. Motor exam: Her left leg has a smaller caliber than her right leg, unchanged. Tone is mildly spastic in the left upper and left lower extremity. She has mild weakness on the left side, especially in her grip, right side is normal. Reflexes are 2+ on the right and 3+ on the left. Cerebellar testing is difficult because she has trouble following complex commands. She does have mild incoordination on the left. Sensory exam is also difficult to assess but she does have decreased light touch and pinprick  sensation on the left hemibody. She does not need help with standing up and has a slightly wide-based stance. She has left circumduction while walking and flexes her left arm and wrist while walking. She maneuvers the walker well. Balance is impaired.        Assessment and Plan:   In summary, Ms. Morikawa is a 64 year old lady with a complex history including congenital left hemiparesis, seizure disorder, static encephalopathy and known hyponatremia as well as low white count secondary to chronic antiepileptic therapy. She has had no recent falls or seizures. Thankfully she has been clinically stable. I have not seen her since September 2014 and she missed an appointment in March 2015. We will check with her insurance carrier to see if they would make an exception and allow her to continue on Keppra 500 mg twice daily. She has done well with this and she is also on Tegretol long-acting 400 mg twice daily. Clinically she is stable and we will do blood work to include CBC with differential, CMP, and Keppra and Tegretol levels. We will call Nicole [name] with the test results. She is advised to use the walker at all times. She is advised to drink water. I will see her back routinely in about 6 months, sooner if the need arises. They were in agreement.

## 2014-12-22 LAB — CBC WITH DIFFERENTIAL
Basophils Absolute: 0 10*3/uL (ref 0.0–0.2)
Basos: 0 %
Eos: 1 %
Eosinophils Absolute: 0 10*3/uL (ref 0.0–0.4)
HCT: 39.4 % (ref 34.0–46.6)
Hemoglobin: 13.8 g/dL (ref 11.1–15.9)
IMMATURE GRANS (ABS): 0 10*3/uL (ref 0.0–0.1)
Immature Granulocytes: 0 %
Lymphocytes Absolute: 0.5 10*3/uL — ABNORMAL LOW (ref 0.7–3.1)
Lymphs: 14 %
MCH: 31.4 pg (ref 26.6–33.0)
MCHC: 35 g/dL (ref 31.5–35.7)
MCV: 90 fL (ref 79–97)
Monocytes Absolute: 0.4 10*3/uL (ref 0.1–0.9)
Monocytes: 14 %
NEUTROS PCT: 71 %
Neutrophils Absolute: 2.3 10*3/uL (ref 1.4–7.0)
PLATELETS: 230 10*3/uL (ref 150–379)
RBC: 4.4 x10E6/uL (ref 3.77–5.28)
RDW: 13.3 % (ref 12.3–15.4)
WBC: 3.2 10*3/uL — AB (ref 3.4–10.8)

## 2014-12-22 LAB — COMPREHENSIVE METABOLIC PANEL
A/G RATIO: 1.4 (ref 1.1–2.5)
ALBUMIN: 4.2 g/dL (ref 3.6–4.8)
ALT: 8 IU/L (ref 0–32)
AST: 13 IU/L (ref 0–40)
Alkaline Phosphatase: 119 IU/L — ABNORMAL HIGH (ref 39–117)
BUN/Creatinine Ratio: 18 (ref 11–26)
BUN: 10 mg/dL (ref 8–27)
CALCIUM: 9.4 mg/dL (ref 8.7–10.3)
CHLORIDE: 93 mmol/L — AB (ref 97–108)
CO2: 26 mmol/L (ref 18–29)
CREATININE: 0.55 mg/dL — AB (ref 0.57–1.00)
GFR calc non Af Amer: 99 mL/min/{1.73_m2} (ref >59)
GFR, EST AFRICAN AMERICAN: 114 mL/min/{1.73_m2} (ref >59)
GLOBULIN, TOTAL: 2.9 g/dL (ref 1.5–4.5)
Glucose: 97 mg/dL (ref 65–99)
Potassium: 4.4 mmol/L (ref 3.5–5.2)
SODIUM: 132 mmol/L — AB (ref 134–144)
Total Bilirubin: 0.3 mg/dL (ref 0.0–1.2)
Total Protein: 7.1 g/dL (ref 6.0–8.5)

## 2014-12-22 LAB — LEVETIRACETAM LEVEL: Levetiracetam Lvl: 15.4 ug/mL (ref 10.0–40.0)

## 2014-12-22 LAB — CARBAMAZEPINE LEVEL, TOTAL: Carbamazepine Lvl: 9.5 ug/mL (ref 4.0–12.0)

## 2014-12-22 NOTE — Progress Notes (Signed)
Quick Note:  Pls call Nicole [name] w her group home. Labs are stable. Sodium level stable in particular and white count slightly low but better than before and therapeutic carbamazepine or Tegretol level. No further action is required, follow-up as previously discussed, call for questions or refills. ______

## 2014-12-23 ENCOUNTER — Telehealth: Payer: Self-pay

## 2014-12-23 NOTE — Telephone Encounter (Signed)
[employer] responded to our Prior Auth request for Keppra stating no authorization is needed because an exception has already been granted.  Per ins, no further action is needed at this time.  I called the patient.  Got no answer, no voicemail.

## 2014-12-29 NOTE — Progress Notes (Signed)
Quick Note:  Shared lab results with legal guardian([name]), verbalized understanding ,[place]. ______

## 2015-05-12 ENCOUNTER — Encounter: Payer: Self-pay | Admitting: Neurology

## 2015-06-21 ENCOUNTER — Ambulatory Visit: Payer: Medicare Other | Admitting: Neurology

## 2015-07-07 ENCOUNTER — Ambulatory Visit (INDEPENDENT_AMBULATORY_CARE_PROVIDER_SITE_OTHER): Payer: Medicare Other | Admitting: Neurology

## 2015-07-07 ENCOUNTER — Encounter: Payer: Self-pay | Admitting: Neurology

## 2015-07-07 VITALS — BP 146/82 | HR 78 | Resp 16 | Ht 66.0 in | Wt 180.0 lb

## 2015-07-07 DIAGNOSIS — E871 Hypo-osmolality and hyponatremia: Secondary | ICD-10-CM | POA: Diagnosis not present

## 2015-07-07 DIAGNOSIS — G9349 Other encephalopathy: Secondary | ICD-10-CM

## 2015-07-07 DIAGNOSIS — G40909 Epilepsy, unspecified, not intractable, without status epilepticus: Secondary | ICD-10-CM

## 2015-07-07 DIAGNOSIS — G808 Other cerebral palsy: Secondary | ICD-10-CM

## 2015-07-07 DIAGNOSIS — D72819 Decreased white blood cell count, unspecified: Secondary | ICD-10-CM

## 2015-07-07 DIAGNOSIS — Q079 Congenital malformation of nervous system, unspecified: Secondary | ICD-10-CM | POA: Diagnosis not present

## 2015-07-07 MED ORDER — LEVETIRACETAM 500 MG PO TABS: 500.0000 mg | ORAL_TABLET | Freq: Two times a day (BID) | ORAL | Status: DC

## 2015-07-07 MED ORDER — CARBAMAZEPINE ER 400 MG PO TB12: 400.0000 mg | ORAL_TABLET | Freq: Two times a day (BID) | ORAL | Status: DC

## 2015-07-07 NOTE — Progress Notes (Signed)
Subjective:    Patient ID: Nicole [name] is a 64 y.o. female.  HPI    Interim history:   Nicole [name] is a 64 year old right-handed woman with an underlying medical history of allergic rhinitis, obesity, discoid lupus, alopecia, hyperlipidemia, pulmonary nodules, and osteoporosis, who presents for followup consultation of her gait disorder, congenital left hemiparesis as well as epilepsy since infancy. She is accompanied by her caregiver, Nicole [name] again today and she resides in a group home. I last saw her on 12/21/2014, at which time she reported doing well. There was no interim history of seizures. We did blood work and called her with her results. Her sodium level was 132 but stable. Her white cell count was low but stable. I suggested we continue with her current medication regimen, including Keppra and Tegretol.  Today, 07/07/2015: She reports feeling stable. She has no new complaints. Nicole [name] reports that there are no new problems and she continues to take Keppra and Tegretol-XR both twice daily. No recent seizures or side effects are reported. She continues to drink Gatorade and juices rather than water. This has helped maintain her sodium level.  Previously:  I saw her on 08/13/2013, at which time her caregiver reported that she was stable. She was using a rolling walker. I checked blood work and we called her group home with the results. Her sodium and chloride levels were stable. Her white count was low but stable at 2.7. Keppra level was within therapeutic range and Tegretol level was high so I decreased long-acting Tegretol to 400 mg twice daily as opposed to 400 in the morning and 800 and night. Keppra stayed at 500 mg twice daily. Of note, she no showed for an appointment on 02/10/2014.  She previously followed with Nicole [name] love and I first met her on 02/06/2013, at which time I checked her sodium level. She has had increasing falls. 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 She has a history of static encephalopathy. She has had hyponatremia repetitively the past. I proceeded with a chest x-ray because I had heard some crackles on auscultation. We check blood work which did show hyponatremia and adjusted her medication. She was recently seen in the emergency room on 07/06/2013 after a fall with head injury. She had a laceration to the back of her head. She received 5 staples. She had a head and cervical spine CT on 07/06/13: Stable ventriculomegaly and periventricular calcifications. Ventriculomegaly felt to most likely be related to white matter disease and volume loss. No acute findings. Degenerative changes and cervical straightening. No acute bony abnormality.   Her Past Medical History Is Significant For: Past Medical History  Diagnosis Date  . Allergic rhinitis   . Obesity   . Menopause   . Moderate mental retardation   . Seizures     focal-being by Dr. Erling [name] (should not take anti-histamines)  . Discoid lupus   . Alopecia   . Abnormal WBC count     low chronic WBC (2-3K)  . Hyperlipidemia   . Osteoporosis   . Pulmonary nodules   . Hypertension     Her Past Surgical History Is Significant For: Past Surgical History  Procedure Laterality Date  . Vesicovaginal fistula closure w/ tah      Her Family History Is Significant For: Family History  Problem Relation Age of Onset  . Breast  cancer Mother     Her Social History Is Significant For: History   Social History  . Marital Status: Single    Spouse Name: N/A  . Number of Children: N/A  . Years of Education: N/A   Occupational History  . 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 Smoking status: Never Smoker   . Smokeless tobacco: Never Used  . Alcohol Use: No  . Drug Use: No  . Sexual  Activity: No   Other Topics Concern  . None   Social History Narrative   [facility] [phone]    Her Allergies Are:  Allergies  Allergen Reactions  . Seasonal Ic [Cholestatin]     Runny nose  :   Her Current Medications Are:  Outpatient Encounter Prescriptions as of 07/07/2015  Medication Sig  . azelastine (ASTELIN) 137 MCG/SPRAY nasal spray Place 1 spray into the nose at bedtime. Use in each nostril as directed  . benzonatate (TESSALON) 100 MG capsule Take by mouth daily. Every 6 hours as needed  . busPIRone (BUSPAR) 15 MG tablet Take 15 mg by mouth 2 (two) times daily.    . calcium carbonate (OS-CAL) 600 MG TABS tablet Take by mouth.  . carbamazepine (TEGRETOL XR) 400 MG 12 hr tablet Take 1 tablet (400 mg total) by mouth 2 (two) times daily. Start on 08/19/2013  . cholecalciferol (VITAMIN D) 400 UNITS TABS Take 400 Units by mouth as directed. Taken tid on weekends, taken qid on weekdays.  . fluticasone (CUTIVATE) 0.005 % ointment Apply 1 application topically daily as needed (for rash; applied to affected area).  . fluticasone (FLONASE) 50 MCG/ACT nasal spray Place 2 sprays into the nose daily.  Marland Kitchen gabapentin (NEURONTIN) 300 MG capsule Take 1 capsule (300 mg total) by mouth as directed. Taken tid on weekends, taken qid on weekdays.  Marland Kitchen levETIRAcetam (KEPPRA) 500 MG tablet Take 1 tablet (500 mg total) by mouth 2 (two) times daily.  . Minoxidil (ROGAINE EX) Apply topically. Apply as directed  . mometasone (ASMANEX 60 METERED DOSES) 220 MCG/INH inhaler Inhale 2 puffs into the lungs daily.  . Multiple Vitamin (MULTIVITAMIN WITH MINERALS) TABS Take 1 tablet by mouth daily.  . Multiple Vitamins-Minerals (CERTAVITE/ANTIOXIDANTS) TABS Take by mouth. Advance , daily  . pantoprazole (PROTONIX) 40 MG tablet   . Selenium 200 MCG TBCR Take 1 tablet by mouth daily.  Marland Kitchen terbinafine (LAMISIL) 250 MG tablet Take 250 mg by mouth every morning.  . Wheat Dextrin (BENEFIBER) CHEW Chew 1 tablet by  mouth 2 (two) times daily.     No facility-administered encounter medications on file as of 07/07/2015.  :  Review of Systems:  Out of a complete 14 point review of systems, all are reviewed and negative with the exception of these symptoms as listed below:   Review of Systems  All other systems reviewed and are negative.   Objective:  Neurologic Exam  Physical Exam Physical Examination:   Filed Vitals:   07/07/15 1201  BP: 146/82  Pulse: 78  Resp: 16   General Examination: The patient is a very pleasant 64 y.o. female in no acute distress. She appears well-developed and well-nourished and adequately groomed. She brought in her rolling walker. She is in good spirits today.  HEENT: normocephalic atraumatic, oropharynx is clear, mild redness to the hard palate. She says she ate something really hot yesterday. She has mild mouth dryness. Neck is supple  with full range of motion. 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 Extraocular tracking is limited. She has mild dysarthria and mild hypophonia in her speech, all unchanged.  Chest is clear to auscultation without crackles or wheezing or rhonchi.  Heart sounds are normal without murmurs, rubs or gallops.  Abdomen is soft, nontender with normal bowel sounds.  She has no pitting edema in the distal lower extremities.  Neurological exam: Mental status: The patient is awake, alert and oriented in all 3 spheres. Her memory, attention, fund of knowledge are mildly impaired, unchanged from before. Motor exam: Her left leg has a smaller caliber than her right leg, unchanged. Tone is mildly spastic in the left upper and left lower extremity. She has mild weakness on the left side, especially in her grip, right side is normal. Reflexes are 2+ on the right and 3+ on the left. Cerebellar testing is difficult because she has trouble following complex commands. She does have mild incoordination on the left. Sensory exam is also difficult to assess but  she does have decreased light touch and pinprick sensation on the left hemibody, unchanged. She does not need help with standing up and has a slightly wide-based stance. She has left circumduction while walking and flexes her left arm and wrist while walking. She maneuvers the walker well. Balance is impaired. Motor exam and sensory exam are all unchanged from last time.        Assessment and Plan:   In summary, Ms. Nicosia is a 65 year old lady with a complex history including congenital left hemiparesis, seizure disorder, static encephalopathy and known hyponatremia as well as low white count secondary to chronic antiepileptic therapy. Labs were stable and actually fairly good last time when we checked in January 2016. She has had no recent falls or seizures. Thankfully she has been clinically stable. I suggested we continue with her current medications. We will check CBC with differential and CMP today. We will call Nicole [name] with the test results. I renewed her prescriptions and asked her to follow-up in 6 months routinely, sooner if needed. I answered all their questions today and filled out her paperwork as well. She and Nicole [name] were in agreement. I spent 15 minutes in total face-to-face time with the patient, more than 50% of which was spent in counseling and coordination of care, reviewing test results, reviewing medication and discussing or reviewing the diagnosis of Seizure disorder, its prognosis and treatment options.

## 2015-07-07 NOTE — Patient Instructions (Signed)
We will continue with your 2 antiepileptic meds at the same dose.  We will do blood work today and Architectural technologist.

## 2015-07-08 ENCOUNTER — Telehealth: Payer: Self-pay

## 2015-07-08 LAB — COMPREHENSIVE METABOLIC PANEL
A/G RATIO: 1.5 (ref 1.1–2.5)
ALT: 11 IU/L (ref 0–32)
AST: 11 IU/L (ref 0–40)
Albumin: 4.4 g/dL (ref 3.6–4.8)
Alkaline Phosphatase: 118 IU/L — ABNORMAL HIGH (ref 39–117)
BILIRUBIN TOTAL: 0.2 mg/dL (ref 0.0–1.2)
BUN/Creatinine Ratio: 13 (ref 11–26)
BUN: 7 mg/dL — ABNORMAL LOW (ref 8–27)
CO2: 24 mmol/L (ref 18–29)
Calcium: 9.7 mg/dL (ref 8.7–10.3)
Chloride: 93 mmol/L — ABNORMAL LOW (ref 97–108)
Creatinine, Ser: 0.55 mg/dL — ABNORMAL LOW (ref 0.57–1.00)
GFR calc non Af Amer: 99 mL/min/{1.73_m2} (ref >59)
GFR, EST AFRICAN AMERICAN: 114 mL/min/{1.73_m2} (ref >59)
Globulin, Total: 3 g/dL (ref 1.5–4.5)
Glucose: 79 mg/dL (ref 65–99)
POTASSIUM: 4.6 mmol/L (ref 3.5–5.2)
Sodium: 135 mmol/L (ref 134–144)
TOTAL PROTEIN: 7.4 g/dL (ref 6.0–8.5)

## 2015-07-08 LAB — CBC WITH DIFFERENTIAL/PLATELET
Basophils Absolute: 0 10*3/uL (ref 0.0–0.2)
Basos: 0 %
EOS (ABSOLUTE): 0 10*3/uL (ref 0.0–0.4)
Eos: 1 %
Hematocrit: 40.7 % (ref 34.0–46.6)
Hemoglobin: 13.7 g/dL (ref 11.1–15.9)
IMMATURE GRANS (ABS): 0 10*3/uL (ref 0.0–0.1)
Immature Granulocytes: 0 %
LYMPHS ABS: 0.7 10*3/uL (ref 0.7–3.1)
Lymphs: 25 %
MCH: 30.9 pg (ref 26.6–33.0)
MCHC: 33.7 g/dL (ref 31.5–35.7)
MCV: 92 fL (ref 79–97)
Monocytes Absolute: 0.6 10*3/uL (ref 0.1–0.9)
Monocytes: 22 %
Neutrophils Absolute: 1.5 10*3/uL (ref 1.4–7.0)
Neutrophils: 52 %
Platelets: 244 10*3/uL (ref 150–379)
RBC: 4.43 x10E6/uL (ref 3.77–5.28)
RDW: 13.6 % (ref 12.3–15.4)
WBC: 2.8 10*3/uL — AB (ref 3.4–10.8)

## 2015-07-08 NOTE — Progress Notes (Signed)
Quick Note:  Pls call Arlys John, pt's caretaker: sodium normal, white cell count again noted to be low, but she has been there before. Would recommend, PCP recheck it in a couple of months to be sure.  Huston Foley, MD, PhD [facility] ([facility]  ______

## 2015-07-08 NOTE — Telephone Encounter (Signed)
-----   Message from Huston Foley, MD sent at 07/08/2015 11:58 AM EDT ----- Pls call Arlys John, pt's caretaker: sodium normal, white cell count again noted to be low, but she has been there before. Would recommend, PCP recheck it in a couple of months to be sure.  Huston Foley, MD, PhD [facility])

## 2015-07-08 NOTE — Telephone Encounter (Signed)
No answer and no voice mail. Will try again later.

## 2015-07-09 NOTE — Telephone Encounter (Signed)
No answer and no vm. I will send letter to patient.

## 2015-09-13 ENCOUNTER — Other Ambulatory Visit: Payer: Self-pay | Admitting: Family Medicine

## 2015-09-13 DIAGNOSIS — N63 Unspecified lump in unspecified breast: Secondary | ICD-10-CM

## 2015-10-25 ENCOUNTER — Ambulatory Visit
Admission: RE | Admit: 2015-10-25 | Discharge: 2015-10-25 | Disposition: A | Discharge status: Home / Self Care | Payer: Medicare Other | Source: Ambulatory Visit | Attending: Family Medicine | Admitting: Family Medicine

## 2015-10-25 DIAGNOSIS — N63 Unspecified lump in unspecified breast: Secondary | ICD-10-CM

## 2015-12-03 ENCOUNTER — Other Ambulatory Visit: Payer: Self-pay | Admitting: Nurse Practitioner

## 2015-12-03 DIAGNOSIS — M8580 Other specified disorders of bone density and structure, unspecified site: Secondary | ICD-10-CM

## 2016-01-06 ENCOUNTER — Ambulatory Visit (INDEPENDENT_AMBULATORY_CARE_PROVIDER_SITE_OTHER): Payer: Medicare Other | Admitting: Neurology

## 2016-01-06 ENCOUNTER — Telehealth: Payer: Self-pay

## 2016-01-06 ENCOUNTER — Encounter: Payer: Self-pay | Admitting: Neurology

## 2016-01-06 VITALS — BP 128/82 | HR 80 | Resp 16 | Ht 66.0 in | Wt 179.0 lb

## 2016-01-06 DIAGNOSIS — G9349 Other encephalopathy: Secondary | ICD-10-CM | POA: Diagnosis not present

## 2016-01-06 DIAGNOSIS — G40909 Epilepsy, unspecified, not intractable, without status epilepticus: Secondary | ICD-10-CM

## 2016-01-06 DIAGNOSIS — D72819 Decreased white blood cell count, unspecified: Secondary | ICD-10-CM | POA: Diagnosis not present

## 2016-01-06 DIAGNOSIS — E871 Hypo-osmolality and hyponatremia: Secondary | ICD-10-CM | POA: Diagnosis not present

## 2016-01-06 DIAGNOSIS — G808 Other cerebral palsy: Secondary | ICD-10-CM | POA: Diagnosis not present

## 2016-01-06 MED ORDER — CARBAMAZEPINE ER 400 MG PO TB12: 400.0000 mg | ORAL_TABLET | Freq: Two times a day (BID) | ORAL | Status: DC

## 2016-01-06 MED ORDER — LEVETIRACETAM 500 MG PO TABS: 500.0000 mg | ORAL_TABLET | Freq: Two times a day (BID) | ORAL | Status: DC

## 2016-01-06 NOTE — Telephone Encounter (Signed)
Called Nicole [name] to see where I need to fax Levetiracetam Rx too? No answer and no VM. Faxed Rx to pharmacy on file. Will try to call back again later.

## 2016-01-06 NOTE — Patient Instructions (Addendum)
We will continue your medication, we will try generic Keppra (levitiracetam) 500 mg 2 times a day, and continue with Tegretol XR 400 mg 2 times a day.   We will check blood work today and call you Nicole [name]) with the test results. Follow up in 6 months.

## 2016-01-06 NOTE — Progress Notes (Signed)
Subjective:    Patient ID: Nicole [name] is a 65 y.o. female.  HPI     Interim history:   Nicole [name] is a 65 year old right-handed woman with an underlying medical history of allergic rhinitis, obesity, discoid lupus, alopecia, hyperlipidemia, pulmonary nodules, and osteoporosis, who presents for followup consultation of her gait disorder, congenital left hemiparesis as well as epilepsy since infancy. She is accompanied by her caregiver, Nicole [name] again today and she resides in a group home. I last saw her on 07/08/2015, at which time she reported feeling stable. She was compliant with her antiepileptic medications. She had no recent seizures or side effects. We checked blood work which showed normal sodium level and WBC of 2.8, which is below normal but not unusual for her. I suggested her PCP recheck it in a couple months to be sure.  Today, 01/06/2016: She reports feeling fine, Nicole [name] reports no recent seizures or problems. She did take a fall when she was at her sister's house. She says she slipped on the bathroom floor. Thankfully, she did not hurt herself or hit her head. She does report having bruised her knee. Otherwise, she feels stable. She will need a refill on her medications but her insurance will not cover her brand-name Keppra any longer. We will try to go with generic and hope for the best.   Previously:  I saw her on 12/21/2014, at which time she reported doing well. There was no interim history of seizures. We did blood work and called her with her results. Her sodium level was 132 but stable. Her white cell count was low but stable. I suggested we continue with her current medication regimen, including Keppra and Tegretol.  I saw her on 08/13/2013, at which time her caregiver reported that she was stable. She was using a rolling walker. I checked blood work and we called her group home with the results. Her sodium and chloride levels were stable. Her white count was low but stable  at 2.7. Keppra level was within therapeutic range and Tegretol level was high so I decreased long-acting Tegretol to 400 mg twice daily as opposed to 400 in the morning and 800 and night. Keppra stayed at 500 mg twice daily. Of note, she no showed for an appointment on 02/10/2014.  She previously followed with Dr. Jeneen Rinks love and I first met her on 02/06/2013, at which time I checked her sodium level. She has had increasing falls. 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 She has a history of static encephalopathy. She has had hyponatremia repetitively the past. I proceeded with a chest x-ray because I had heard some crackles on auscultation. We check blood work which did show hyponatremia and adjusted her medication. She was recently seen in the emergency room on 07/06/2013 after a fall with head injury. She had a laceration to the back of her head. She received 5 staples. She had a head and cervical spine CT on 07/06/13: Stable ventriculomegaly and periventricular calcifications. Ventriculomegaly felt to most likely be related to white matter disease and volume loss. No acute findings. Degenerative changes and cervical straightening. No acute bony abnormality.  Her Past Medical History Is Significant For: Past Medical History  Diagnosis Date  . Allergic rhinitis   . Obesity   . Menopause   . Moderate mental retardation   . Seizures ([place])  focal-being by Dr. Erling Cruz (should not take anti-histamines)  . Discoid lupus   . Alopecia   . Abnormal WBC count     low chronic WBC (2-3K)  . Hyperlipidemia   . Osteoporosis   . Pulmonary nodules   . Hypertension     Her Past Surgical History Is Significant For: Past Surgical History  Procedure Laterality Date  . Vesicovaginal fistula closure w/ tah      Her Family History Is Significant For: Family History   Problem Relation Age of Onset  . Breast cancer Mother     Her Social History Is Significant For: Social History   Social History  . Marital Status: Single    Spouse Name: N/A  . Number of Children: N/A  . Years of Education: N/A   Occupational History  . 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 Smoking status: Never Smoker   . Smokeless tobacco: Never Used  . Alcohol Use: No  . Drug Use: No  . Sexual Activity: No   Other Topics Concern  . None   Social History Narrative   [facility] [phone]    Her Allergies Are:  Allergies  Allergen Reactions  . Seasonal Ic [Cholestatin]     Runny nose  :   Her Current Medications Are:  Outpatient Encounter Prescriptions as of 01/06/2016  Medication Sig  . azelastine (ASTELIN) 137 MCG/SPRAY nasal spray Place 1 spray into the nose at bedtime. Use in each nostril as directed  . benzonatate (TESSALON) 100 MG capsule Take by mouth daily. Every 6 hours as needed  . busPIRone (BUSPAR) 15 MG tablet Take 15 mg by mouth 2 (two) times daily.    . calcium carbonate (OS-CAL) 600 MG TABS tablet Take by mouth.  . carbamazepine (TEGRETOL XR) 400 MG 12 hr tablet Take 1 tablet (400 mg total) by mouth 2 (two) times daily. Start on 08/19/2013  . fluticasone (CUTIVATE) 0.005 % ointment Apply 1 application topically daily as needed (for rash; applied to affected area).  . gabapentin (NEURONTIN) 300 MG capsule Take 1 capsule (300 mg total) by mouth as directed. Taken tid on weekends, taken qid on weekdays.  Marland Kitchen levETIRAcetam (KEPPRA) 500 MG tablet Take 1 tablet (500 mg total) by mouth 2 (two) times daily.  . Minoxidil (ROGAINE EX) Apply topically. Apply as directed  . mometasone (ASMANEX 60 METERED DOSES) 220 MCG/INH inhaler Inhale 2 puffs into the lungs daily.  . Multiple Vitamin (MULTIVITAMIN WITH MINERALS) TABS Take 1 tablet by mouth daily.  . Multiple Vitamins-Minerals  (CERTAVITE/ANTIOXIDANTS) TABS Take by mouth. Advance , daily  . pantoprazole (PROTONIX) 40 MG tablet   . Selenium 200 MCG TBCR Take 1 tablet by mouth daily.  . Vitamin D, Cholecalciferol, 1000 units CAPS Take by mouth.  . Wheat Dextrin (BENEFIBER) CHEW Chew 1 tablet by mouth 2 (two) times daily.    . [DISCONTINUED] fluticasone (FLONASE) 50 MCG/ACT nasal spray Place 2 sprays into the nose daily.  . [DISCONTINUED] terbinafine (LAMISIL) 250 MG tablet Take 250 mg by mouth every morning.  . [DISCONTINUED] cholecalciferol (VITAMIN D) 400 UNITS TABS Take 400 Units by mouth as directed. Taken tid on weekends, taken qid on weekdays.   No facility-administered encounter medications on file as of 01/06/2016.  :  Review of Systems:  Out of a complete 14 point review of systems, all are reviewed and negative with the exception of these symptoms as listed  below:   Review of Systems  Neurological:       Patient is here for f/u. No new concerns.     Objective:  Neurologic Exam  Physical Exam Physical Examination:   Filed Vitals:   01/06/16 1110  BP: 128/82  Pulse: 80  Resp: 16    General Examination: The patient is a very pleasant 65 y.o. female in no acute distress. She appears well-developed and well-nourished and adequately groomed. She brought in her rolling walker. She is in good spirits today.  HEENT: normocephalic atraumatic, oropharynx is clear, mild redness to the hard palate, stable.  She has mild mouth dryness. Neck is supple with full range of motion. 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 Extraocular tracking is limited. She has mild dysarthria and mild hypophonia in her speech, all unchanged.  Chest is clear to auscultation without crackles or wheezing or rhonchi.  Heart sounds are normal without murmurs, rubs or gallops.  Abdomen is soft, nontender with normal bowel sounds.  She has no pitting edema in the distal lower extremities.  Neurological exam: Mental status: The  patient is awake, alert and oriented in all 3 spheres. Her memory, attention, fund of knowledge are mildly impaired, unchanged from before. Motor exam: Her left leg has a smaller caliber than her right leg, unchanged. Tone is mildly spastic in the left upper and left lower extremity. She has mild weakness on the left side, especially in her grip, right side is normal. Reflexes are 2+ on the right and 3+ on the left. Cerebellar testing is difficult because she has trouble following complex commands. She does have mild incoordination on the left. Sensory exam is also difficult to assess but she does have decreased light touch and pinprick sensation on the left hemibody, unchanged. She does not need help with standing up and has a slightly wide-based stance. She has left circumduction while walking and flexes her left arm and wrist while walking. She maneuvers the walker well. Balance is mildly impaired. Motor exam and sensory exam are all unchanged from last time.        Assessment and Plan:   In summary, Ms. Moger is a 65 year old lady with a complex history including congenital left hemiparesis, seizure disorder, static encephalopathy and known hyponatremia as well as low white count - usually stable - secondary to chronic antiepileptic therapy. Labs have been stable on a 6 monthly basis. We will continue with her Tegretol long-acting 400 mg twice daily and Keppra 500 mg twice daily and I provided her with new prescriptions. She would have to go with a generic form of Keppra as her insurance no longer covers the brand name. We will try this and hope for the best. We will recheck blood work today including Tegretol level, Keppra level, CBC with differential and CMP. We will call Nicole [name] with the test results.  Her exam is stable. I renewed her prescriptions today. I answered all their questions today and they were in agreement. I spent 20 minutes in total face-to-face time with the patient, more than 50% of  which was spent in counseling and coordination of care, reviewing test results, reviewing medication and discussing or reviewing the diagnosis of Seizure disorder, its prognosis and treatment options.

## 2016-01-06 NOTE — Telephone Encounter (Signed)
Called and no answer and no vm  

## 2016-01-07 NOTE — Progress Notes (Signed)
Quick Note:  White count low at 2.7, not much different from her usual we will have to recheck in 3 months, sodium mildly low, not unusual for her recheck in 3 months. She can come here for it, please place order for CMP and CBC with diff for 3 months in future. Huston Foley, MD, PhD [facility] ([facility])  ______

## 2016-01-10 ENCOUNTER — Telehealth: Payer: Self-pay

## 2016-01-10 LAB — COMPREHENSIVE METABOLIC PANEL
ALT: 12 IU/L (ref 0–32)
AST: 16 IU/L (ref 0–40)
Albumin/Globulin Ratio: 1.4 (ref 1.1–2.5)
Albumin: 4.1 g/dL (ref 3.6–4.8)
Alkaline Phosphatase: 109 IU/L (ref 39–117)
BUN/Creatinine Ratio: 24 (ref 11–26)
BUN: 11 mg/dL (ref 8–27)
Bilirubin Total: 0.2 mg/dL (ref 0.0–1.2)
CALCIUM: 9.3 mg/dL (ref 8.7–10.3)
CO2: 27 mmol/L (ref 18–29)
CREATININE: 0.46 mg/dL — AB (ref 0.57–1.00)
Chloride: 91 mmol/L — ABNORMAL LOW (ref 96–106)
GFR calc Af Amer: 120 mL/min/{1.73_m2} (ref >59)
GFR, EST NON AFRICAN AMERICAN: 104 mL/min/{1.73_m2} (ref >59)
GLOBULIN, TOTAL: 2.9 g/dL (ref 1.5–4.5)
GLUCOSE: 69 mg/dL (ref 65–99)
Potassium: 4.9 mmol/L (ref 3.5–5.2)
SODIUM: 132 mmol/L — AB (ref 134–144)
Total Protein: 7 g/dL (ref 6.0–8.5)

## 2016-01-10 LAB — CBC WITH DIFFERENTIAL/PLATELET
BASOS ABS: 0 10*3/uL (ref 0.0–0.2)
Basos: 0 %
EOS (ABSOLUTE): 0.1 10*3/uL (ref 0.0–0.4)
Eos: 2 %
Hematocrit: 35.8 % (ref 34.0–46.6)
Hemoglobin: 12.2 g/dL (ref 11.1–15.9)
Immature Grans (Abs): 0 10*3/uL (ref 0.0–0.1)
Immature Granulocytes: 0 %
LYMPHS ABS: 0.6 10*3/uL — AB (ref 0.7–3.1)
Lymphs: 22 %
MCH: 31.6 pg (ref 26.6–33.0)
MCHC: 34.1 g/dL (ref 31.5–35.7)
MCV: 93 fL (ref 79–97)
Monocytes Absolute: 0.5 10*3/uL (ref 0.1–0.9)
Monocytes: 17 %
NEUTROS ABS: 1.6 10*3/uL (ref 1.4–7.0)
Neutrophils: 59 %
Platelets: 245 10*3/uL (ref 150–379)
RBC: 3.86 x10E6/uL (ref 3.77–5.28)
RDW: 12.8 % (ref 12.3–15.4)
WBC: 2.7 10*3/uL — AB (ref 3.4–10.8)

## 2016-01-10 LAB — CARBAMAZEPINE LEVEL, TOTAL: CARBAMAZEPINE LVL: 10.1 ug/mL (ref 4.0–12.0)

## 2016-01-10 LAB — LEVETIRACETAM LEVEL: LEVETIRACETAM: 13 ug/mL (ref 10.0–40.0)

## 2016-01-10 NOTE — Telephone Encounter (Signed)
Called but no answer and no vm. I will send letter.

## 2016-01-10 NOTE — Telephone Encounter (Signed)
-----   Message from Huston Foley, MD sent at 01/07/2016 12:29 PM EST ----- White count low at 2.7, not much different from her usual we will have to recheck in 3 months, sodium mildly low, not unusual for her recheck in 3 months. She can come here for it, please place order for CMP and CBC with diff for 3 months in future. Huston Foley, MD, PhD [facility])

## 2016-01-13 ENCOUNTER — Other Ambulatory Visit: Payer: Self-pay

## 2016-01-13 DIAGNOSIS — G40909 Epilepsy, unspecified, not intractable, without status epilepticus: Secondary | ICD-10-CM

## 2016-07-05 ENCOUNTER — Ambulatory Visit: Payer: Medicare Other | Admitting: Neurology

## 2016-07-05 ENCOUNTER — Telehealth: Payer: Self-pay

## 2016-07-05 NOTE — Telephone Encounter (Signed)
Patient did not show to appt today  

## 2016-07-06 ENCOUNTER — Encounter: Payer: Self-pay | Admitting: Neurology

## 2016-07-31 ENCOUNTER — Ambulatory Visit (INDEPENDENT_AMBULATORY_CARE_PROVIDER_SITE_OTHER): Payer: Medicare Other | Admitting: Neurology

## 2016-07-31 ENCOUNTER — Encounter: Payer: Self-pay | Admitting: Neurology

## 2016-07-31 VITALS — BP 150/88 | HR 70 | Resp 16 | Ht 66.0 in | Wt 174.0 lb

## 2016-07-31 DIAGNOSIS — D72819 Decreased white blood cell count, unspecified: Secondary | ICD-10-CM

## 2016-07-31 DIAGNOSIS — F69 Unspecified disorder of adult personality and behavior: Secondary | ICD-10-CM

## 2016-07-31 DIAGNOSIS — G9349 Other encephalopathy: Secondary | ICD-10-CM

## 2016-07-31 DIAGNOSIS — IMO0002 Reserved for concepts with insufficient information to code with codable children: Secondary | ICD-10-CM

## 2016-07-31 DIAGNOSIS — G40909 Epilepsy, unspecified, not intractable, without status epilepticus: Secondary | ICD-10-CM | POA: Diagnosis not present

## 2016-07-31 DIAGNOSIS — E871 Hypo-osmolality and hyponatremia: Secondary | ICD-10-CM

## 2016-07-31 NOTE — Patient Instructions (Signed)
We will check blood work today and call you with the results.  If you have additional problems with mood issues, I would recommend a referral to psychiatry.

## 2016-07-31 NOTE — Progress Notes (Signed)
Subjective:    Nicole [name] ID: Nicole Nicole [name] is a 65 y.o. female.  HPI     Interim history:   Nicole Nicole [name] is a 65 year old right-handed woman with an underlying medical history of allergic rhinitis, obesity, discoid lupus, alopecia, hyperlipidemia, pulmonary nodules, and osteoporosis, who presents for followup consultation of her gait disorder, congenital left hemiparesis as well as epilepsy since infancy. She is accompanied by her caregiver, Nicole Nicole [name] again today and another caretaker from the group home she resides. Of note, she no showed for an appointment on 07/05/2016. Ides in. I last saw her on 01/06/2016, at which time she was stable. She did take one fall when she was at her sister's house, she slipped on the bathroom floor but thankfully did not hurt herself or hit her head or lost consciousness. She did sustain a bruised knee. Blood work showed a white cell count at 2.7, sodium level at 132, both lower than normal but stable for her. I changed her Keppra to generic and we continued the carbamazepine.  Today, 07/31/2016: She reports that she gets irritated with her caretaker. She has been working with the lady one-on-one for the past several months and just gets frustrated with her it sounds like. Nicole Nicole [name] reports that she has had some behavioral issues lately, her other caretaker, Nicole Nicole [name] reports that she gets along with Nicole Nicole [name] quite well herself and it may be just a difficult relationship with her other caretaker. Nevertheless, no recent seizures are reported, she did have one spell of dizziness, resulting in a fall without head injury reported.    Previously:   I saw her on 07/08/2015, at which time she reported feeling stable. She was compliant with her antiepileptic medications. She had no recent seizures or side effects. We checked blood work which showed normal sodium level and WBC of 2.8, which is below normal but not unusual for her. I suggested her PCP recheck it in a couple months to be  sure.   I saw her on 12/21/2014, at which time she reported doing well. There was no interim history of seizures. We did blood work and called her with her results. Her sodium level was 132 but stable. Her white cell count was low but stable. I suggested we continue with her current medication regimen, including Keppra and Tegretol.   I saw her on 08/13/2013, at which time her caregiver reported that she was stable. She was using a rolling walker. I checked blood work and we called her group home with the results. Her sodium and chloride levels were stable. Her white count was low but stable at 2.7. Keppra level was within therapeutic range and Tegretol level was high so I decreased long-acting Tegretol to 400 mg twice daily as opposed to 400 in the morning and 800 and night. Keppra stayed at 500 mg twice daily. Of note, she no showed for an appointment on 02/10/2014.   She previously followed with Nicole Nicole [name] and I first met her on 02/06/2013, at which time I checked her sodium level. She has had increasing falls. 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 She has a history of static encephalopathy. She has had hyponatremia repetitively the past. I proceeded with a chest x-ray because I had heard some crackles on auscultation. We check blood work which did show hyponatremia and adjusted her medication. She was recently seen in  the emergency room on 07/06/2013 after a fall with head injury. She had a laceration to the back of her head. She received 5 staples. She had a head and cervical spine CT on 07/06/13: Stable ventriculomegaly and periventricular calcifications. Ventriculomegaly felt to most likely be related to white matter disease and volume loss. No acute findings. Degenerative changes and cervical straightening. No acute bony abnormality.   Her Past  Medical History Is Significant For: Past Medical History:  Diagnosis Date  . 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 Allergic rhinitis   . Alopecia   . Discoid lupus   . Hyperlipidemia   . Hypertension   . Menopause   . Moderate mental retardation   . Obesity   . Osteoporosis   . Pulmonary nodules   . Seizures ([place])    focal-being by Dr. Erling Cruz (should not take anti-histamines)    Her Past Surgical History Is Significant For: Past Surgical History:  Procedure Laterality Date  . VESICOVAGINAL FISTULA CLOSURE W/ TAH      Her Family History Is Significant For: Family History  Problem Relation Age of Onset  . Breast cancer Mother     Her Social History Is Significant For: Social History   Social History  . Marital status: Single    Spouse name: N/A  . Number of children: N/A  . Years of education: N/A   Occupational History  . 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 Smoking status: Never Smoker  . Smokeless tobacco: Never Used  . Alcohol use No  . Drug use: No  . Sexual activity: No   Other Topics Concern  . None   Social History Narrative   [facility] [phone]    Her Allergies Are:  Allergies  Allergen Reactions  . Seasonal Ic [Cholestatin]     Runny nose  :   Her Current Medications Are:  Outpatient Encounter Prescriptions as of 07/31/2016  Medication Sig  . azelastine (ASTELIN) 137 MCG/SPRAY nasal spray Place 1 spray into the nose at bedtime. Use in each nostril as directed  . benzonatate (TESSALON) 100 MG capsule Take by mouth daily. Every 6 hours as needed  . busPIRone (BUSPAR) 15 MG tablet Take 15 mg by mouth 2 (two) times daily.    . calcium carbonate (OS-CAL) 600 MG TABS tablet Take by mouth.  . carbamazepine (TEGRETOL XR) 400 MG 12 hr tablet Take 1 tablet (400 mg total) by mouth 2 (two) times daily. Start on 08/19/2013  . diclofenac (VOLTAREN) 75 MG EC tablet    . gabapentin (NEURONTIN) 300 MG capsule Take 1 capsule (300 mg total) by mouth as directed. Taken tid on weekends, taken qid on weekdays.  Marland Kitchen HYDROcodone-acetaminophen (NORCO/VICODIN) 5-325 MG tablet Take 1 tablet by mouth every 4 (four) hours as needed for moderate pain.  Marland Kitchen levETIRAcetam (KEPPRA) 500 MG tablet Take 1 tablet (500 mg total) by mouth 2 (two) times daily.  . mometasone (ASMANEX 60 METERED DOSES) 220 MCG/INH inhaler Inhale 2 puffs into the lungs daily.  . Multiple Vitamins-Minerals (CERTAVITE/ANTIOXIDANTS) TABS Take by mouth. Advance , daily  . pantoprazole (PROTONIX) 40 MG tablet   . Selenium 200 MCG TBCR Take 1 tablet by mouth daily.  . Vitamin D, Cholecalciferol, 1000 units CAPS Take by mouth.  . Wheat Dextrin (BENEFIBER) CHEW Chew 1 tablet by mouth 2 (two) times daily.    . [DISCONTINUED] fluticasone (  CUTIVATE) 0.005 % ointment Apply 1 application topically daily as needed (for rash; applied to affected area).  . [DISCONTINUED] Minoxidil (ROGAINE EX) Apply topically. Apply as directed  . [DISCONTINUED] Multiple Vitamin (MULTIVITAMIN WITH MINERALS) TABS Take 1 tablet by mouth daily.   No facility-administered encounter medications on file as of 07/31/2016.   :  Review of Systems:  Out of a complete 14 point review of systems, all are reviewed and negative with the exception of these symptoms as listed below:  Review of Systems  Neurological:       Care takers reports some behavioral changes in Nicole [name]. Falls.     Objective:  Neurologic Exam  Physical Exam Physical Examination:   Vitals:   07/31/16 1410  BP: (!) 150/88  Pulse: 70  Resp: 16    General Examination: The Nicole [name] is a very pleasant 65 y.o. female in no acute distress. She appears well-developed and well-nourished and adequately groomed. She brought in her rolling walker. She is Slightly irritable today but does calm down and is cooperative with the exam.   HEENT: Normocephalic atraumatic, oropharynx  is clear, mild redness to the hard palate, stable.  She has mild mouth dryness. Neck is supple with full range of motion. 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 Extraocular tracking is limited. She has mild dysarthria and mild hypophonia in her speech, all unchanged.  Chest is clear to auscultation without crackles or wheezing or rhonchi.  Heart sounds are normal without murmurs, rubs or gallops.  Abdomen is soft, nontender with normal bowel sounds.  She has no pitting edema in the distal lower extremities.  Neurological exam: Mental status: The Nicole [name] is awake, alert and oriented in all 3 spheres. Her memory, attention, fund of knowledge are mildly impaired, unchanged from before. Motor exam: Her left leg has a smaller caliber than her right leg, unchanged. Tone is mildly spastic in the left upper and left lower extremity. She has mild weakness on the left side, especially in her grip, right side is normal. Reflexes are 2+ on the right and 3+ on the left. Cerebellar testing is difficult because she has trouble following complex commands, and is distracted easily She does have mild incoordination on the left. Sensory exam is also difficult to assess but she does have decreased light touch and pinprick sensation on the left hemibody, unchanged. She does not need help with standing up and has a slightly wide-based stance. She has left circumduction while walking and flexes her left arm and wrist while walking. She maneuvers the walker well. Balance is mildly impaired. Motor exam and sensory exam are all unchanged from before.         Assessment and Plan:   In summary, Ms. Roell is a 65 year old lady with a complex history including congenital left hemiparesis, seizure disorder, static encephalopathy and known hyponatremia as well as low white count - usually stable - secondary to chronic antiepileptic medication, who presents for follow-up consultation of her long-standing history of seizures, without recent  evidence of seizure activity. Exam and labs have been stable usually on a 6 monthly basis. She has been on long-acting Tegretol 400 mg twice daily and Keppra generic 500 mg twice daily. She has had some recent behavioral issues, this may be secondary to not getting along with one of her caretakers. I suggested that they could try to see if her caretakers that work with her one-on-one could be switched. In addition, if she continues to have behavioral issues, referral to  psychiatry could be considered, but Nicole [name] is not very open to this possibility. We will check blood work today including Tegretol level, Keppra level, CBC with differential and CMP and call her caretaker, typically Nicole Nicole [name] with the test results. I will see her back in 6 months, sooner as needed. I answered all their questions today and the Nicole [name] and her caretakers were in agreement  I spent 25 minutes in total face-to-face time with the Nicole [name], more than 50% of which was spent in counseling and coordination of care, reviewing test results, reviewing medication and discussing or reviewing the diagnosis of Seizure disorder, its prognosis and treatment options.

## 2016-08-01 ENCOUNTER — Telehealth: Payer: Self-pay

## 2016-08-01 NOTE — Telephone Encounter (Signed)
I called caretaker 2x, no answer and no vm. I will mail results since we have had this trouble before, getting in contact with them.

## 2016-08-01 NOTE — Telephone Encounter (Signed)
-----   Message from Huston FoleySaima Athar, MD sent at 08/01/2016  3:50 PM EDT ----- Tegretol level in the therapeutic window. Low white count and low sodium stable in her typical realm. No immediate action required. Please call her caretaker Arlys JohnBrian from her group home to notify. Huston FoleySaima Athar, MD, PhD [facility]

## 2016-08-01 NOTE — Progress Notes (Signed)
Tegretol level in the therapeutic window. Low white count and low sodium stable in her typical realm. No immediate action required. Please call her caretaker Arlys JohnBrian from her group home to notify. Huston FoleySaima Dulse Rutan, MD, PhD [facility]

## 2016-08-02 LAB — CBC WITH DIFFERENTIAL/PLATELET
BASOS ABS: 0 10*3/uL (ref 0.0–0.2)
BASOS: 0 %
EOS (ABSOLUTE): 0 10*3/uL (ref 0.0–0.4)
Eos: 1 %
HEMOGLOBIN: 12.2 g/dL (ref 11.1–15.9)
Hematocrit: 35.3 % (ref 34.0–46.6)
IMMATURE GRANS (ABS): 0 10*3/uL (ref 0.0–0.1)
IMMATURE GRANULOCYTES: 0 %
LYMPHS: 20 %
Lymphocytes Absolute: 0.5 10*3/uL — ABNORMAL LOW (ref 0.7–3.1)
MCH: 31.4 pg (ref 26.6–33.0)
MCHC: 34.6 g/dL (ref 31.5–35.7)
MCV: 91 fL (ref 79–97)
MONOCYTES: 15 %
Monocytes Absolute: 0.4 10*3/uL (ref 0.1–0.9)
NEUTROS ABS: 1.6 10*3/uL (ref 1.4–7.0)
NEUTROS PCT: 64 %
PLATELETS: 235 10*3/uL (ref 150–379)
RBC: 3.88 x10E6/uL (ref 3.77–5.28)
RDW: 13.1 % (ref 12.3–15.4)
WBC: 2.5 10*3/uL — CL (ref 3.4–10.8)

## 2016-08-02 LAB — COMPREHENSIVE METABOLIC PANEL
A/G RATIO: 1.4 (ref 1.2–2.2)
ALK PHOS: 102 IU/L (ref 39–117)
ALT: 14 IU/L (ref 0–32)
AST: 17 IU/L (ref 0–40)
Albumin: 4.2 g/dL (ref 3.6–4.8)
BUN/Creatinine Ratio: 22 (ref 12–28)
BUN: 13 mg/dL (ref 8–27)
CHLORIDE: 91 mmol/L — AB (ref 96–106)
CO2: 27 mmol/L (ref 18–29)
Calcium: 9.5 mg/dL (ref 8.7–10.3)
Creatinine, Ser: 0.6 mg/dL (ref 0.57–1.00)
GFR calc Af Amer: 110 mL/min/{1.73_m2} (ref >59)
GFR calc non Af Amer: 95 mL/min/{1.73_m2} (ref >59)
GLUCOSE: 88 mg/dL (ref 65–99)
Globulin, Total: 2.9 g/dL (ref 1.5–4.5)
POTASSIUM: 4.4 mmol/L (ref 3.5–5.2)
Sodium: 132 mmol/L — ABNORMAL LOW (ref 134–144)
Total Protein: 7.1 g/dL (ref 6.0–8.5)

## 2016-08-02 LAB — CARBAMAZEPINE LEVEL, TOTAL: Carbamazepine (Tegretol), S: 9.9 ug/mL (ref 4.0–12.0)

## 2016-08-02 LAB — LEVETIRACETAM LEVEL: LEVETIRACETAM: 11.1 ug/mL (ref 10.0–40.0)

## 2016-10-12 ENCOUNTER — Other Ambulatory Visit: Payer: Self-pay | Admitting: Family Medicine

## 2016-10-12 DIAGNOSIS — Z1231 Encounter for screening mammogram for malignant neoplasm of breast: Secondary | ICD-10-CM

## 2016-11-20 ENCOUNTER — Ambulatory Visit
Admission: RE | Admit: 2016-11-20 | Discharge: 2016-11-20 | Disposition: A | Discharge status: Home / Self Care | Payer: Medicare Other | Source: Ambulatory Visit | Attending: Family Medicine | Admitting: Family Medicine

## 2016-11-20 DIAGNOSIS — Z1231 Encounter for screening mammogram for malignant neoplasm of breast: Secondary | ICD-10-CM

## 2017-01-31 ENCOUNTER — Ambulatory Visit: Payer: Medicare Other | Admitting: Neurology

## 2017-02-06 ENCOUNTER — Encounter: Payer: Self-pay | Admitting: Neurology

## 2017-04-05 ENCOUNTER — Ambulatory Visit (INDEPENDENT_AMBULATORY_CARE_PROVIDER_SITE_OTHER): Payer: Medicare Other | Admitting: Neurology

## 2017-04-05 ENCOUNTER — Encounter: Payer: Self-pay | Admitting: Neurology

## 2017-04-05 VITALS — BP 148/88 | HR 80

## 2017-04-05 DIAGNOSIS — G9349 Other encephalopathy: Secondary | ICD-10-CM | POA: Diagnosis not present

## 2017-04-05 DIAGNOSIS — G40909 Epilepsy, unspecified, not intractable, without status epilepticus: Secondary | ICD-10-CM

## 2017-04-05 DIAGNOSIS — E871 Hypo-osmolality and hyponatremia: Secondary | ICD-10-CM | POA: Diagnosis not present

## 2017-04-05 DIAGNOSIS — D708 Other neutropenia: Secondary | ICD-10-CM

## 2017-04-05 DIAGNOSIS — G808 Other cerebral palsy: Secondary | ICD-10-CM

## 2017-04-05 DIAGNOSIS — D72818 Other decreased white blood cell count: Secondary | ICD-10-CM

## 2017-04-05 MED ORDER — LEVETIRACETAM 500 MG PO TABS: 500.0000 mg | ORAL_TABLET | Freq: Two times a day (BID) | ORAL | 5 refills | Status: DC

## 2017-04-05 MED ORDER — CARBAMAZEPINE ER 400 MG PO TB12: 400.0000 mg | ORAL_TABLET | Freq: Two times a day (BID) | ORAL | 5 refills | Status: DC

## 2017-04-05 NOTE — Patient Instructions (Signed)
We will check blood work today and call you with the test results. We will keep your medications the same.  We can see you in 6 months with one of our nurse practitioners.

## 2017-04-05 NOTE — Progress Notes (Signed)
Subjective:    Patient ID: Nicole [name] is a 66 y.o. female.  HPI     Interim history:   [name] is a 66 year old right-handed woman with an underlying medical history of allergic rhinitis, obesity, discoid lupus, alopecia, hyperlipidemia, pulmonary nodules, , osteoporosis, gait disorder, congenital left hemiparesis and childhood epilepsy, who presents for follow-up consultation of her seizure disorder, complicated by hyponatremia which has been stable and deemed secondary to medication. She is accompanied by her caregiver, Nicole [name] from her group home. I last saw her on 07/31/2016, at which time she reported getting more irritated with her caretakers. She had been working with one of the female caretakers one-on-one. Nicole [name] reported recent behavioral issues. There were no recent seizure reports, one spell of dizziness was reported. She had one fall without head injury reported. Exam was stable, we checked her labs and Tegretol level was within therapeutic window and her hyponatremia was stable, low white cell count was also stable for her. We called Nicole [name] with the test results at the time. I suggested we continue with her medication and follow-up 6 monthly. I suggested we continue with long-acting Tegretol 400 mg twice daily and generic Keppra 500 mg twice daily. I suggested they consider referral to psychiatry for her mood issues and behavioral issues.   Today, 04/05/2017 (all dictated new, as well as above notes, some dictation done in note pad or Word, outside of chart, may appear as copied):  She reports left knee pain, is wearing a brace, her sister got the brace for her. Otherwise, Nicole [name] reports no recent seizures, improved/stabilized mood irritability and behavior from last time. She gets along with her caretakers. They have 2 new lady caretakers at the group home and things are going well in that regard. No recent acute illness. No recent seizure reported. She tries to hydrate with Gatorade  and non-sugary electrolyte water. She did slide out of the bed this morning but did not hurt herself, she says she controlled the fall and landed on her buttocks.  The patient's allergies, current medications, family history, past medical history, past social history, past surgical history and problem list were reviewed and updated as appropriate.   Previously (copied from previous notes for reference):   I saw her on 01/06/2016, at which time she was stable. She did take one fall when she was at her sister's house, she slipped on the bathroom floor but thankfully did not hurt herself or hit her head or lost consciousness. She did sustain a bruised knee. Blood work showed a white cell count at 2.7, sodium level at 132, both lower than normal but stable for her. I changed her Keppra to generic and we continued the carbamazepine.   I saw her on 07/08/2015, at which time she reported feeling stable. She was compliant with her antiepileptic medications. She had no recent seizures or side effects. We checked blood work which showed normal sodium level and WBC of 2.8, which is below normal but not unusual for her. I suggested her PCP recheck it in a couple months to be sure.   I saw her on 12/21/2014, at which time she reported doing well. There was no interim history of seizures. We did blood work and called her with her results. Her sodium level was 132 but stable. Her white cell count was low but stable. I suggested we continue with her current medication regimen, including Keppra and Tegretol.   I saw her on 08/13/2013, at which time her caregiver  reported that she was stable. She was using a rolling walker. I checked blood work and we called her group home with the results. Her sodium and chloride levels were stable. Her white count was low but stable at 2.7. Keppra level was within therapeutic range and Tegretol level was high so I decreased long-acting Tegretol to 400 mg twice daily as opposed to 400 in  the morning and 800 and night. Keppra stayed at 500 mg twice daily. Of note, she no showed for an appointment on 02/10/2014.   She previously followed with Dr. Jeneen Rinks love and I first met her on 02/06/2013, at which time I checked her sodium level. She has had increasing falls. 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 She has a history of static encephalopathy. She has had hyponatremia repetitively the past. I proceeded with a chest x-ray because I had heard some crackles on auscultation. We check blood work which did show hyponatremia and adjusted her medication. She was recently seen in the emergency room on 07/06/2013 after a fall with head injury. She had a laceration to the back of her head. She received 5 staples. She had a head and cervical spine CT on 07/06/13: Stable ventriculomegaly and periventricular calcifications. Ventriculomegaly felt to most likely be related to white matter disease and volume loss. No acute findings. Degenerative changes and cervical straightening. No acute bony abnormality.    Her Past Medical History Is Significant For: Past Medical History:  Diagnosis Date  . 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 Allergic rhinitis   . Alopecia   . Discoid lupus   . Hyperlipidemia   . Hypertension   . Menopause   . Moderate mental retardation   . Obesity   . Osteoporosis   . Pulmonary nodules   . Seizures ([place])    focal-being by Dr. Erling Cruz (should not take anti-histamines)    Her Past Surgical History Is Significant For: Past Surgical History:  Procedure Laterality Date  . VESICOVAGINAL FISTULA CLOSURE W/ TAH      Her Family History Is Significant For: Family History  Problem Relation Age of Onset  . Breast cancer Mother     Her Social History Is Significant For: Social History   Social History  . Marital  status: Single    Spouse name: N/A  . Number of children: N/A  . Years of education: N/A   Occupational History  . 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 Smoking status: Never Smoker  . Smokeless tobacco: Never Used  . Alcohol use No  . Drug use: No  . Sexual activity: No   Other Topics Concern  . None   Social History Narrative   [facility] [phone]    Her Allergies Are:  Allergies  Allergen Reactions  . Seasonal Ic [Cholestatin]     Runny nose  :   Her Current Medications Are:  Outpatient Encounter Prescriptions as of 04/05/2017  Medication Sig  . azelastine (ASTELIN) 137 MCG/SPRAY nasal spray Place 1 spray into the nose at bedtime. Use in each nostril as directed  . benzonatate (TESSALON) 100 MG capsule Take by mouth daily. Every 6 hours as needed  . busPIRone (BUSPAR) 15 MG tablet Take 15 mg by mouth 2 (two) times daily.    Marland Kitchen  calcium carbonate (OS-CAL) 600 MG TABS tablet Take by mouth.  . carbamazepine (TEGRETOL XR) 400 MG 12 hr tablet Take 1 tablet (400 mg total) by mouth 2 (two) times daily. Start on 08/19/2013  . Cholecalciferol (VITAMIN D3) 2000 units TABS Take by mouth.  . diclofenac (VOLTAREN) 75 MG EC tablet   . gabapentin (NEURONTIN) 300 MG capsule Take 1 capsule (300 mg total) by mouth as directed. Taken tid on weekends, taken qid on weekdays.  Marland Kitchen levETIRAcetam (KEPPRA) 500 MG tablet Take 1 tablet (500 mg total) by mouth 2 (two) times daily.  . mometasone (ASMANEX 60 METERED DOSES) 220 MCG/INH inhaler Inhale 2 puffs into the lungs daily.  . Multiple Vitamins-Minerals (CERTAVITE/ANTIOXIDANTS) TABS Take by mouth. Advance , daily  . pantoprazole (PROTONIX) 40 MG tablet   . Selenium 200 MCG TBCR Take 1 tablet by mouth daily.  . Wheat Dextrin (BENEFIBER) CHEW Chew 1 tablet by mouth 2 (two) times daily.    . [DISCONTINUED] HYDROcodone-acetaminophen (NORCO/VICODIN) 5-325 MG tablet Take 1  tablet by mouth every 4 (four) hours as needed for moderate pain.   No facility-administered encounter medications on file as of 04/05/2017.   :  Review of Systems:  Out of a complete 14 point review of systems, all are reviewed and negative with the exception of these symptoms as listed below: Review of Systems  Neurological:       No new concerns per patient.  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     Objective:  Neurologic Exam  Physical Exam Physical Examination:   Vitals:   04/05/17 0826  BP: (!) 148/88  Pulse: 80    General Examination: The patient is a very pleasant 66 y.o. female in no acute distress. She appears well-developed and well-nourished and well groomed.   HEENT: Normocephalic, atraumatic, Right pupil is reactive to light, she is blind in the left eye, has corrective eyeglasses, tracking is occult for her. She has unchanged mild dysarthria, otherwise benign findings on oropharynx exam with the exception of mild to moderate mouth dryness noted.   Chest: Clear to auscultation without wheezing, rhonchi or crackles noted.  Heart: S1+S2+0, regular and normal without murmurs, rubs or gallops noted.   Abdomen: Soft, non-tender and non-distended with normal bowel sounds appreciated on auscultation.  Extremities: There is no pitting edema in the distal lower extremities bilaterally. Pedal pulses are intact.  Skin: Warm and dry without trophic changes noted.  Musculoskeletal: exam reveals no obvious joint deformities, tenderness or joint swelling or erythema, but reports L knee arthritis. Velcro brace.   Neurologically: Mental status: The patient is awake, alert and oriented in all 3 spheres. Her memory, attention, fund of knowledge are mildly impaired, unchanged from before. Better spirits today than last time.  Motor exam: Her left leg has a smaller caliber than her right leg, unchanged. Tone is mildly spastic in the left upper and left lower  extremity. She has mild weakness on the left side, especially in her grip, right side is normal. Reflexes are 2+ on the right and 3+ on the left. Cerebellar testing is difficult because she has trouble following complex commands. Overall motor and fine motor exam stable from before. She does have mild incoordination on the left. Sensory exam is also difficult to assess but she does have decreased light touch and pinprick sensation on the left hemibody, unchanged. She does not need help with standing up and has a slightly wide-based stance. She brought her wheelchair  today. Typically she brings her walker. Balance is overall stable, mildly impaired.   Assessment and Plan:   In summary, Nicole [name] is a 66 year old woman with a complex history including congenital left hemiparesis, seizure disorder, static encephalopathy and known hyponatremia as well as low white count - usually stable - deemed secondary to chronic antiepileptic medication, which has been kept the same, as she has been stable Sz-wise and lab wise. She presents for follow-up her 6 monthly FU visit. She has a long-standing history of seizures, without recent evidence of seizure activity. Exam and labs have been stable usually on a 6 monthly basis. She has been on long-acting Tegretol 400 mg twice daily and Keppra generic 500 mg twice daily. Her recent behavioral and mood related issues have essentially resolved or stabilized per Nicole [name]. She gets along with her caretakers. We will again check blood work today including Tegretol level, Keppra level, CBC with differential and CMP. We will call Nicole [name] with the test results as usual. She is encouraged to continue to drink electrolyte water, Gatorade and tomato juice for her hydration as opposed to free water.  I suggested a 6 month follow-up, she can see one of our nurse practitioners at the time. I will see her back after that. I answered all their questions today and the patient and Nicole [name] were in  agreement.  I spent 15 minutes in total face-to-face time with the patient, more than 50% of which was spent in counseling and coordination of care, reviewing test results, reviewing medication and discussing or reviewing the diagnosis of Sz d/o, its prognosis and treatment options. Pertinent laboratory and imaging test results that were available during this visit with the patient were reviewed by me and considered in my medical decision making (see chart for details).

## 2017-04-06 NOTE — Progress Notes (Signed)
Labs stable, in fact, slightly better than her typical values, tegretol level good too, keppra level pending, will call if abn.  pls notify Arlys JohnBrian.  thx

## 2017-04-08 LAB — COMPREHENSIVE METABOLIC PANEL
ALBUMIN: 4.1 g/dL (ref 3.6–4.8)
ALT: 12 IU/L (ref 0–32)
AST: 17 IU/L (ref 0–40)
Albumin/Globulin Ratio: 1.5 (ref 1.2–2.2)
Alkaline Phosphatase: 117 IU/L (ref 39–117)
BILIRUBIN TOTAL: 0.3 mg/dL (ref 0.0–1.2)
BUN / CREAT RATIO: 27 (ref 12–28)
BUN: 17 mg/dL (ref 8–27)
CALCIUM: 9.8 mg/dL (ref 8.7–10.3)
CHLORIDE: 93 mmol/L — AB (ref 96–106)
CO2: 24 mmol/L (ref 18–29)
Creatinine, Ser: 0.63 mg/dL (ref 0.57–1.00)
GFR, EST AFRICAN AMERICAN: 107 mL/min/{1.73_m2} (ref >59)
GFR, EST NON AFRICAN AMERICAN: 93 mL/min/{1.73_m2} (ref >59)
GLUCOSE: 87 mg/dL (ref 65–99)
Globulin, Total: 2.7 g/dL (ref 1.5–4.5)
Potassium: 4.6 mmol/L (ref 3.5–5.2)
Sodium: 132 mmol/L — ABNORMAL LOW (ref 134–144)
TOTAL PROTEIN: 6.8 g/dL (ref 6.0–8.5)

## 2017-04-08 LAB — CBC WITH DIFFERENTIAL/PLATELET
BASOS ABS: 0 10*3/uL (ref 0.0–0.2)
BASOS: 0 %
EOS (ABSOLUTE): 0 10*3/uL (ref 0.0–0.4)
Eos: 0 %
HEMOGLOBIN: 12.2 g/dL (ref 11.1–15.9)
Hematocrit: 36.5 % (ref 34.0–46.6)
IMMATURE GRANS (ABS): 0 10*3/uL (ref 0.0–0.1)
IMMATURE GRANULOCYTES: 0 %
LYMPHS: 13 %
Lymphocytes Absolute: 0.5 10*3/uL — ABNORMAL LOW (ref 0.7–3.1)
MCH: 31.7 pg (ref 26.6–33.0)
MCHC: 33.4 g/dL (ref 31.5–35.7)
MCV: 95 fL (ref 79–97)
MONOCYTES: 11 %
Monocytes Absolute: 0.4 10*3/uL (ref 0.1–0.9)
NEUTROS ABS: 3 10*3/uL (ref 1.4–7.0)
Neutrophils: 76 %
Platelets: 228 10*3/uL (ref 150–379)
RBC: 3.85 x10E6/uL (ref 3.77–5.28)
RDW: 13.5 % (ref 12.3–15.4)
WBC: 4 10*3/uL (ref 3.4–10.8)

## 2017-04-08 LAB — LEVETIRACETAM LEVEL: Levetiracetam Lvl: 18.4 ug/mL (ref 10.0–40.0)

## 2017-04-08 LAB — CARBAMAZEPINE LEVEL, TOTAL: Carbamazepine (Tegretol), S: 11.1 ug/mL (ref 4.0–12.0)

## 2017-06-19 ENCOUNTER — Telehealth: Payer: Self-pay | Admitting: Neurology

## 2017-06-19 DIAGNOSIS — G9349 Other encephalopathy: Secondary | ICD-10-CM

## 2017-06-19 DIAGNOSIS — G808 Other cerebral palsy: Secondary | ICD-10-CM

## 2017-06-19 DIAGNOSIS — E871 Hypo-osmolality and hyponatremia: Secondary | ICD-10-CM

## 2017-06-19 DIAGNOSIS — G40909 Epilepsy, unspecified, not intractable, without status epilepticus: Secondary | ICD-10-CM

## 2017-06-19 MED ORDER — CARBAMAZEPINE ER 100 MG PO TB12: 300.0000 mg | ORAL_TABLET | Freq: Two times a day (BID) | ORAL | 5 refills | Status: DC

## 2017-06-19 NOTE — Telephone Encounter (Signed)
I called Nicole Batheresa, NP back and explained this to her. She is in agreement to this plan.  I called Nicole [name], pt's caretaker, and advised him that pt's tegretol has been changed from 400mg  BID to 300 mg BID and it has been [facility]. Nicole [name] verbalized understanding of this change and has no questions at this time.

## 2017-06-19 NOTE — Telephone Encounter (Signed)
I called Rosey Batheresa, NP back. She reports that the pt has had low sodium levels for the past couple weeks. Today her level is 130 and 10 days ago it was 129. Her WBC is also low, it has been 2.5, 3.5, 2.8 and today is 3.2. Her hemoglobin is also low, but the pt has refused colonoscopies in the past. Rosey Batheresa thinks that pt's tegretol could be the cause and are trying to correct the sodium deficit with diet, but if this is not successful, could the tegretol be decreased? Rosey Batheresa, NP is just wanting Dr. Teofilo PodAthar's thoughts regarding this.

## 2017-06-19 NOTE — Telephone Encounter (Signed)
Nicole [name]-NP/[facility] [phone] called pt's is having problems with low sodium, she is trying to control it with diet at this time. Please call to discuss labs

## 2017-06-19 NOTE — Telephone Encounter (Signed)
I would encourage gatorade, tomato juice intake.  Will reduce tegretol XR to 300 mg bid from 400 mg bid, Rx adjusted and sent to pharm. Please call Rosey Batheresa back to update and also will have to call Arlys JohnBrian her caretaker to update her group home staff and MAR. thx

## 2017-10-01 ENCOUNTER — Ambulatory Visit: Payer: Medicare Other | Attending: Nurse Practitioner | Admitting: Physical Therapy

## 2017-10-01 DIAGNOSIS — R2689 Other abnormalities of gait and mobility: Secondary | ICD-10-CM | POA: Diagnosis present

## 2017-10-01 DIAGNOSIS — M6281 Muscle weakness (generalized): Secondary | ICD-10-CM

## 2017-10-01 DIAGNOSIS — R293 Abnormal posture: Secondary | ICD-10-CM | POA: Diagnosis present

## 2017-10-01 NOTE — Therapy (Signed)
[facility] [address] [place], [place], [address] Phone: [phone]   Fax:  [phone]  Physical Therapy Evaluation  Patient Details  Name: Nicole [name] MRN: [id number] Date of Birth: Mar 03, 1951 Referring Provider: Cherylann Ratel  Encounter Date: 10/01/2017      PT End of Session - 10/01/17 1734    Visit Number 1   Number of Visits 5   Date for PT Re-Evaluation 11/30/17   Authorization Type Medicare-GCODE eval and every 10th visit   PT Start Time 0938   PT Stop Time 1020   PT Time Calculation (min) 42 min   Activity Tolerance Patient tolerated treatment well   Behavior During Therapy [facility] for tasks assessed/performed      Past Medical History:  Diagnosis Date  . 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 Allergic rhinitis   . Alopecia   . Discoid lupus   . Hyperlipidemia   . Hypertension   . Menopause   . Moderate mental retardation   . Obesity   . Osteoporosis   . Pulmonary nodules   . Seizures ([facility])    focal-being by Dr. Sandria Manly (should not take anti-histamines)    Past Surgical History:  Procedure Laterality Date  . VESICOVAGINAL FISTULA CLOSURE W/ TAH      There were no vitals filed for this visit.       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  Pt comes in with caregiver, Arlys John, from group home, who reports that they want walker for standing up straighter.  Pt has had several falls in the past 6 months.  Pt reports can't stand on L knee too long.  Pt had had AFO in her shoe, but has not worn in years.   Patient is accompained by: --  Caregiver, Arlys John   Pertinent History chronic L leg/knee pain, discoid lupus, obesity, hyperlipidemia, osteoporosis, congenital L hemiparesis and childhood epilepsy, seizure disorder, moderate MR   Patient Stated Goals Want to try to get answers about a walker that would be good for her.   Currently in  Pain? Yes   Pain Score --  does not rate-   Pain Location Knee   Pain Orientation Left   Pain Type Chronic pain   Pain Onset More than a month ago   Pain Frequency Constant   Aggravating Factors  walking too long   Pain Relieving Factors sitting, cream, brace            [facility] PT Assessment - 10/01/17 0952      Assessment   Medical Diagnosis balance problems   Referring Provider Cherylann Ratel   Onset Date/Surgical Date 09/17/17     Precautions   Precautions Fall   Precaution Comments decreased safety awareness     Balance Screen   Has the patient fallen in the past 6 months Yes   How many times? 12   Has the patient had a decrease in activity level because of a fear of falling?  No   Is the patient reluctant to leave their home because of a fear of falling?  No     Home Environment   Living Environment Group home   Additional Comments Has room to herself, has rollator walker     Prior Function   Level of Independence --  Distant supervision   Leisure Uses wheelchair for community mobility  Cognition   Overall Cognitive Status History of cognitive impairments - at baseline     Observation/Other Assessments   Focus on Therapeutic Outcomes (FOTO)  NA     Posture/Postural Control   Posture/Postural Control Postural limitations   Postural Limitations Forward head;Rounded Shoulders;Flexed trunk     ROM / Strength   AROM / PROM / Strength Strength     Strength   Overall Strength Comments Grossly tested 4/5 RLE, 3+/5 LLE, trace L ankle dorsiflexion     Transfers   Transfers Sit to Stand;Stand to Sit   Sit to Stand 6: Modified independent (Device/Increase time);With upper extremity assist;From chair/3-in-1   Stand to Sit 6: Modified independent (Device/Increase time);With upper extremity assist;To chair/3-in-1     Ambulation/Gait   Ambulation/Gait Yes   Ambulation/Gait Assistance 5: Supervision   Ambulation Distance (Feet) 60 Feet  x 2   Assistive  device Rollator  Trial of UP walker   Gait Pattern Step-through pattern;Decreased dorsiflexion - left;Left genu recurvatum;Poor foot clearance - left;Decreased step length - left;Decreased hip/knee flexion - left;Trunk flexed  Pt veers rollator to R and L while walking   Ambulation Surface Level;Indoor   Gait velocity 40.37 sec with rollator= 0.81 ft/sec; with UP walker:  42.56 sec = 0.77 ft/sec  Pt easily distracted, needs cues to keep pace with gait   Gait Comments Trial of UP walker, based on caregiver request of more upright walker to assist with posture.  UP walker x 60 ft with min guard assistance/supervision, with pt noted to be closer to walker with more upright posture.  Pt does tend to have difficulty keeping arms in armrests and is very forceful at trying to lock/unlock handbrakes.            Objective measurements completed on examination: See above findings.                  PT Education - 10/01/17 1730    Education provided Yes   Education Details Educated on option of UPwalker for more upright posture; provided with handout from UP walker; discussed POC and trial of UP walker prior to further recommendations   Person(s) Educated Patient;Caregiver(s)   Methods Explanation   Comprehension Verbalized understanding             PT Long Term Goals - 10/01/17 1750      PT LONG TERM GOAL #1   Title Pt will perform HEP to address strength, posture, balance with caregiver's assistance.  TARGET 11/02/17   Time 4   Period Weeks   Status New   Target Date 11/02/17     PT LONG TERM GOAL #2   Title Pt will ambulate at least 150 ft using appropriate assistive device (rollator walker versus UP walker) with caregiver supervision.   Time 4   Period Weeks   Status New   Target Date 11/02/17     PT LONG TERM GOAL #3   Title Pt/caregiver will verbalize understanding of fall prevention in the home environment.   Time 4   Period Weeks   Status New   Target  Date 11/02/17                Plan - 10/01/17 1737    Clinical Impression Statement PT is a 66 year old female with history of chronic L leg and knee pain, congenital hemiparesis, childhood epilepsy, seizure disorder, with history of falls.  She lives at group home, currently uses rollator walker, but caregiver is questioning  if there is a safer, more upright walker she can use.  She has had physical therapy in the past, L AFO in the past, but caregiver reports she doesn't really want either.  Pt presents with decreased strength, decreased balance, decreased safety awareness, decreased independence with gait, abnormal posture.  Pt would benefit from skilled physical therapy to address the above stated deficits, to decrease fall risk and improve functional mobility and safety within group home.   History and Personal Factors relevant to plan of care: History of falls, history of moderate MR, L hemiparesis (>3 co-morbidities as part of PMH); lives at group home requiring distant supervision; uses rollator walker   Clinical Presentation Evolving   Clinical Presentation due to: Fall risk per decreased gait velocity, history of gait deficits, history of falls (at least 12 falls in past 6 months)   Clinical Decision Making Moderate   Rehab Potential Good   PT Frequency 1x / week   PT Duration 4 weeks  plus eval   PT Treatment/Interventions ADLs/Self Care Home Management;Gait training;DME Instruction;Functional mobility training;Therapeutic activities;Therapeutic exercise;Balance training;Neuromuscular re-education;Patient/family education;Orthotic Fit/Training   PT Next Visit Plan Trial again of UP walker and discuss potential benefits/concerns; initiate HEP-lower extremity stregnthening and posture   Consulted and Agree with Plan of Care Patient;Family member/caregiver   Family Member Consulted Arlys John from group home      Patient will benefit from skilled therapeutic intervention in order to  improve the following deficits and impairments:  Abnormal gait, Decreased balance, Decreased mobility, Decreased safety awareness, Decreased strength, Difficulty walking, Postural dysfunction  Visit Diagnosis: Other abnormalities of gait and mobility  Abnormal posture  Muscle weakness (generalized)      G-Codes - 10-15-2017 1753    Functional Assessment Tool Used (Outpatient Only) 0.82 ft/sec gait with rollator; 0.77 ft/sec with UP walker, hx of 12 falls in past 6 months   Functional Limitation Mobility: Walking and moving around   Mobility: Walking and Moving Around Current Status ([id number]) At least 40 percent but less than 60 percent impaired, limited or restricted   Mobility: Walking and Moving Around Goal Status [phone]) At least 40 percent but less than 60 percent impaired, limited or restricted       Problem List Patient Active Problem List   Diagnosis Date Noted  . Oral thrush 02/28/2012  . ALOPECIA 01/04/2010  . ALLERGIC RHINITIS 07/17/2008  . PULMONARY NODULE 07/17/2008  . ENLARGEMENT OF LYMPH NODES 07/17/2008  . Cough 07/17/2008    Mykela Mewborn W. 10-15-17, 5:54 PM  Gean Maidens., PT   [facility] [address] [place], [place], [address] Phone: [phone]   Fax:  [phone]  Name: NENA HAMPE MRN: [id number] Date of Birth: 26-Oct-1951

## 2017-10-04 ENCOUNTER — Ambulatory Visit: Payer: Medicare Other | Attending: Nurse Practitioner | Admitting: Physical Therapy

## 2017-10-04 DIAGNOSIS — R2689 Other abnormalities of gait and mobility: Secondary | ICD-10-CM | POA: Insufficient documentation

## 2017-10-04 DIAGNOSIS — R293 Abnormal posture: Secondary | ICD-10-CM | POA: Insufficient documentation

## 2017-10-08 ENCOUNTER — Encounter: Payer: Self-pay | Admitting: Adult Health

## 2017-10-08 ENCOUNTER — Ambulatory Visit (INDEPENDENT_AMBULATORY_CARE_PROVIDER_SITE_OTHER): Payer: Medicare Other | Admitting: Adult Health

## 2017-10-08 VITALS — BP 128/74 | HR 110

## 2017-10-08 DIAGNOSIS — R269 Unspecified abnormalities of gait and mobility: Secondary | ICD-10-CM

## 2017-10-08 DIAGNOSIS — R569 Unspecified convulsions: Secondary | ICD-10-CM

## 2017-10-08 DIAGNOSIS — Z5181 Encounter for therapeutic drug level monitoring: Secondary | ICD-10-CM

## 2017-10-08 NOTE — Patient Instructions (Signed)
Your Plan:  Continue Carbamazepine and Keppra Blood work today If your symptoms worsen or you develop new symptoms please let us know.   [facility] you [name]. I hope we have been able to provide you high quality care today.  You may receive a patient satisfaction survey over the next few weeks. We would appreciate your feedback and comments so that we may continue to improve ourselves and the health of our patients.

## 2017-10-08 NOTE — Progress Notes (Addendum)
PATIENT: Nicole [name] DOB: 1951/11/15  REASON FOR VISIT: follow up- seizures HISTORY FROM: patient  HISTORY OF PRESENT ILLNESS: Today 10/08/17 Nicole [name] is a 66 year old female with a history of seizure events.  She returns today for follow-up.  She is here today with her caregiver.  She has not had any additional seizure events.  She continues on Keppra and carbamazepine.  She is able to complete all ADLs independently.  She does not operate a motor vehicle.  She is attending physical therapy.  Reports that she is having a hard time using her Rollator.  She has had several falls over the last couple of months.  Unfortunately she has not sustained any significant injuries.  She returns today for an evaluation.  HISTORY 04/05/2017  She reports left knee pain, is wearing a brace, her sister got the brace for her. Otherwise, Arlys John reports no recent seizures, improved/stabilized mood irritability and behavior from last time. She gets along with her caretakers. They have 2 new lady caretakers at the group home and things are going well in that regard. No recent acute illness. No recent seizure reported. She tries to hydrate with Gatorade and non-sugary electrolyte water. She did slide out of the bed this morning but did not hurt herself, she says she controlled the fall and landed on her buttocks.  The patient's allergies, current medications, family history, past medical history, past social history, past surgical history and problem list were reviewed and updated as appropriate.   REVIEW OF SYSTEMS: Out of a complete 14 system review of symptoms, the patient complains only of the following symptoms, and all other reviewed systems are negative.  See HPI  ALLERGIES: Allergies  Allergen Reactions  . Seasonal Ic [Cholestatin]     Runny nose    HOME MEDICATIONS: Outpatient Medications Prior to Visit  Medication Sig Dispense Refill  . azelastine (ASTELIN) 137 MCG/SPRAY nasal spray Place 1  spray into the nose at bedtime. Use in each nostril as directed    . benzonatate (TESSALON) 100 MG capsule Take by mouth daily. Every 6 hours as needed    . busPIRone (BUSPAR) 15 MG tablet Take 15 mg by mouth 2 (two) times daily.      . calcium carbonate (OS-CAL) 600 MG TABS tablet Take by mouth.    . carbamazepine (TEGRETOL XR) 100 MG 12 hr tablet Take 3 tablets (300 mg total) by mouth 2 (two) times daily. Reducing due to low sodium levels recently. 180 tablet 5  . Cholecalciferol (VITAMIN D3) 2000 units TABS Take by mouth.    . diclofenac (VOLTAREN) 75 MG EC tablet     . gabapentin (NEURONTIN) 300 MG capsule Take 1 capsule (300 mg total) by mouth as directed. Taken tid on weekends, taken qid on weekdays. 115 capsule 1  . levETIRAcetam (KEPPRA) 500 MG tablet Take 1 tablet (500 mg total) by mouth 2 (two) times daily. 60 tablet 5  . lisinopril (PRINIVIL,ZESTRIL) 20 MG tablet 20 mg daily.    . mometasone (ASMANEX 60 METERED DOSES) 220 MCG/INH inhaler Inhale 2 puffs into the lungs daily. 1 Inhaler 12  . Multiple Vitamins-Minerals (CERTAVITE/ANTIOXIDANTS) TABS Take by mouth. Advance , daily    . pantoprazole (PROTONIX) 40 MG tablet     . Selenium 200 MCG TBCR Take 1 tablet by mouth daily.    . Wheat Dextrin (BENEFIBER) CHEW Chew 1 tablet by mouth 2 (two) times daily.      Marland Kitchen lisinopril (PRINIVIL,ZESTRIL) 10 MG tablet  Take 10 mg by mouth daily.     No facility-administered medications prior to visit.     PAST MEDICAL HISTORY: Past Medical History:  Diagnosis Date  . 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 Allergic rhinitis   . Alopecia   . Discoid lupus   . Hyperlipidemia   . Hypertension   . Menopause   . Moderate mental retardation   . Obesity   . Osteoporosis   . Pulmonary nodules   . Seizures (HCC)    focal-being by Dr. Sandria ManlyLove (should not take anti-histamines)    PAST SURGICAL HISTORY: Past Surgical History:  Procedure Laterality Date  . VESICOVAGINAL FISTULA CLOSURE W/  TAH      FAMILY HISTORY: Family History  Problem Relation Age of Onset  . Breast cancer Mother     SOCIAL HISTORY: Social History   Socioeconomic History  . Marital status: Single    Spouse name: Not on file  . Number of children: Not on file  . Years of education: Not on file  . Highest education level: Not on file  Social Needs  . Financial resource strain: Not on file  . Food insecurity - worry: Not on file  . Food insecurity - inability: Not on file  . Transportation needs - medical: Not on file  . Transportation needs - non-medical: Not on file  Occupational History  . 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 Smoking status: Never Smoker  . Smokeless tobacco: Never Used  Substance and Sexual Activity  . Alcohol use: No  . Drug use: No  . Sexual activity: No  Other Topics Concern  . Not on file  Social History Narrative   [facility] [phone]      PHYSICAL EXAM  Vitals:   10/08/17 0828  BP: 128/74  Pulse: (!) 110   There is no height or weight on file to calculate BMI.  Generalized: Well developed, in no acute distress   Neurological examination  Mentation: Alert oriented to time, place, history taking. Follows all commands speech and language fluent Cranial nerve II-XII: Pupils were equal round reactive to light. Extraocular movements were full, visual field were full on confrontational test. Facial sensation and strength were normal. Uvula tongue midline. Head turning and shoulder shrug  were normal and symmetric. Motor: The motor testing reveals 5 over 5 strength in the right upper and lower extremity.  3-4/5 strength in the left upper and lower extremity. Sensory: Sensory testing is intact to soft touch on all 4 extremities. No evidence of extinction is noted.  Coordination: Cerebellar testing reveals good finger-nose-finger and heel-to-shin on the right.   Some difficulty on the left due to mobility and strength Gait and station: Patient is using a Rollator when ambulating.  Gait is slightly unsteady.   DIAGNOSTIC DATA (LABS, IMAGING, TESTING) - I reviewed patient records, labs, notes, testing and imaging myself where available.  Lab Results  Component Value Date   WBC 4.0 04/05/2017   HGB 12.2 04/05/2017   HCT 36.5 04/05/2017   MCV 95 04/05/2017   PLT 228 04/05/2017      Component Value Date/Time   NA 132 (L) 04/05/2017 0922   K 4.6 04/05/2017 0922   CL 93 (L) 04/05/2017 0922   CO2 24 04/05/2017 0922   GLUCOSE 87 04/05/2017 0922   GLUCOSE 118 (H) 07/06/2013 1015  BUN 17 04/05/2017 0922   CREATININE 0.63 04/05/2017 0922   CALCIUM 9.8 04/05/2017 0922   PROT 6.8 04/05/2017 0922   ALBUMIN 4.1 04/05/2017 0922   AST 17 04/05/2017 0922   ALT 12 04/05/2017 0922   ALKPHOS 117 04/05/2017 0922   BILITOT 0.3 04/05/2017 0922   GFRNONAA 93 04/05/2017 0922   GFRAA 107 04/05/2017 0922       ASSESSMENT AND PLAN 66 y.o. year old female  has a past medical history of Abnormal WBC count, Allergic rhinitis, Alopecia, Discoid lupus, Hyperlipidemia, Hypertension, Menopause, Moderate mental retardation, Obesity, Osteoporosis, Pulmonary nodules, and Seizures (HCC). here with :  1.  Seizure disorder 2.  Abnormality of gait  Overall the patient is doing well.  She has not had any additional seizure events.  She will continue on carbamazepine and Keppra.  I will check blood work today.  Patient continues to have some difficulty with gait.  She may would benefit from a different type of walker?  She will continue with physical therapy.  She is advised that if her symptoms worsen or she develops new symptoms she should let us know.  She will follow-up in 6 months or sooner if needed.  I spent 15 minutes with the patient. 50% of this time was spent discussing her medication   Butch Penny, MSN, NP-C 10/08/2017, 8:42 AM [facility] [address] [place], [place] [address] [phone]  I reviewed the above note and documentation by the Nurse Practitioner and agree with the history, physical exam, assessment and plan as outlined above. I was immediately available for face-to-face consultation. Huston Foley, MD, PhD [facility])

## 2017-10-09 LAB — COMPREHENSIVE METABOLIC PANEL
A/G RATIO: 1.3 (ref 1.2–2.2)
ALT: 11 IU/L (ref 0–32)
AST: 17 IU/L (ref 0–40)
Albumin: 4 g/dL (ref 3.6–4.8)
Alkaline Phosphatase: 119 IU/L — ABNORMAL HIGH (ref 39–117)
BUN/Creatinine Ratio: 27 (ref 12–28)
BUN: 20 mg/dL (ref 8–27)
Bilirubin Total: <0.2 mg/dL (ref 0.0–1.2)
CALCIUM: 9.2 mg/dL (ref 8.7–10.3)
CO2: 21 mmol/L (ref 20–29)
Chloride: 92 mmol/L — ABNORMAL LOW (ref 96–106)
Creatinine, Ser: 0.73 mg/dL (ref 0.57–1.00)
GFR calc Af Amer: 98 mL/min/{1.73_m2} (ref >59)
GFR, EST NON AFRICAN AMERICAN: 85 mL/min/{1.73_m2} (ref >59)
GLOBULIN, TOTAL: 3 g/dL (ref 1.5–4.5)
Glucose: 84 mg/dL (ref 65–99)
POTASSIUM: 4.9 mmol/L (ref 3.5–5.2)
SODIUM: 131 mmol/L — AB (ref 134–144)
Total Protein: 7 g/dL (ref 6.0–8.5)

## 2017-10-09 LAB — CBC WITH DIFFERENTIAL/PLATELET
BASOS: 0 %
Basophils Absolute: 0 10*3/uL (ref 0.0–0.2)
EOS (ABSOLUTE): 0 10*3/uL (ref 0.0–0.4)
Eos: 1 %
Hematocrit: 35.1 % (ref 34.0–46.6)
Hemoglobin: 11.6 g/dL (ref 11.1–15.9)
IMMATURE GRANULOCYTES: 0 %
Immature Grans (Abs): 0 10*3/uL (ref 0.0–0.1)
LYMPHS ABS: 0.6 10*3/uL — AB (ref 0.7–3.1)
Lymphs: 17 %
MCH: 32.1 pg (ref 26.6–33.0)
MCHC: 33 g/dL (ref 31.5–35.7)
MCV: 97 fL (ref 79–97)
MONOS ABS: 0.3 10*3/uL (ref 0.1–0.9)
Monocytes: 10 %
NEUTROS ABS: 2.4 10*3/uL (ref 1.4–7.0)
Neutrophils: 72 %
PLATELETS: 252 10*3/uL (ref 150–379)
RBC: 3.61 x10E6/uL — ABNORMAL LOW (ref 3.77–5.28)
RDW: 13.7 % (ref 12.3–15.4)
WBC: 3.4 10*3/uL (ref 3.4–10.8)

## 2017-10-09 LAB — CARBAMAZEPINE LEVEL, TOTAL: Carbamazepine (Tegretol), S: 9 ug/mL (ref 4.0–12.0)

## 2017-10-10 ENCOUNTER — Telehealth: Payer: Self-pay | Admitting: *Deleted

## 2017-10-10 ENCOUNTER — Ambulatory Visit: Payer: Medicare Other | Admitting: Physical Therapy

## 2017-10-10 NOTE — Telephone Encounter (Signed)
Reached Nicole [name] at group home and informed him that patient's lab work is unremarkable. Advised him this RN tried twice to reach patient on her cell #. He stated the number called is best contact for her. He stated he would let her know of lab results, verbalized understanding, appreciation.

## 2017-10-10 NOTE — Telephone Encounter (Signed)
Attempted to reach patient. Phone listed as home reached B Young, Production designer, theatre/television/filmmanager of group home; LVM. Called patient's listed mobile number; it immediately rang busy.

## 2017-10-18 ENCOUNTER — Ambulatory Visit: Payer: Medicare Other | Admitting: Physical Therapy

## 2017-10-22 ENCOUNTER — Ambulatory Visit: Payer: Medicare Other | Admitting: Physical Therapy

## 2017-10-22 ENCOUNTER — Encounter: Payer: Self-pay | Admitting: Physical Therapy

## 2017-10-22 DIAGNOSIS — R293 Abnormal posture: Secondary | ICD-10-CM | POA: Diagnosis present

## 2017-10-22 DIAGNOSIS — R2689 Other abnormalities of gait and mobility: Secondary | ICD-10-CM | POA: Diagnosis not present

## 2017-10-22 NOTE — Patient Instructions (Signed)
  Instructed patient and caregiver that her current rollator walker is likely a better, safer option for gait than UPWalker.  Instructed patient to take brief standing breaks while using rollator walker, to get closer to walker and to stand upright.  -Caregiver to call about Motivo walker system

## 2017-10-22 NOTE — Therapy (Signed)
[facility] [address] [place], [place], [address] Phone: [phone]   Fax:  [phone]  Physical Therapy Treatment  Patient Details  Name: Nicole [name] MRN: [id number] Date of Birth: [birth year] Referring Provider: Nonda Lou   Encounter Date: 10/22/2017  PT End of Session - 10/22/17 0913    Visit Number  2    Number of Visits  5    Date for PT Re-Evaluation  11/30/17    Authorization Type  Medicare-GCODE eval and every 10th visit    PT Start Time  0804    PT Stop Time  0845    PT Time Calculation (min)  41 min    Equipment Utilized During Treatment  Gait belt    Activity Tolerance  Patient tolerated treatment well    Behavior During Therapy  [facility] for tasks assessed/performed       Past Medical History:  Diagnosis Date  . 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 Allergic rhinitis   . Alopecia   . Discoid lupus   . Hyperlipidemia   . Hypertension   . Menopause   . Moderate mental retardation   . Obesity   . Osteoporosis   . Pulmonary nodules   . Seizures ([place])    focal-being by Dr. Erling Cruz (should not take anti-histamines)    Past Surgical History:  Procedure Laterality Date  . VESICOVAGINAL FISTULA CLOSURE W/ TAH      There were no vitals filed for this visit.  Subjective Assessment - 10/22/17 0807    Subjective  [name], caregiver, is present and thinks that patient wants to try out the UP walker.      Patient is accompained by:  -- Caregiver, [name]    Pertinent History  chronic L leg/knee pain, discoid lupus, obesity, hyperlipidemia, osteoporosis, congenital L hemiparesis and childhood epilepsy, seizure disorder, moderate MR    Patient Stated Goals  Want to try to get answers about a walker that would be good for her.    Currently in Pain?  Yes    Pain Score  -- does not rate    Pain Location  Knee    Pain Orientation  Left    Pain Type  Chronic pain    Pain Onset  More than a  month ago    Pain Frequency  Constant    Aggravating Factors   worse with cold weather    Pain Relieving Factors  sitting, warm bath                      [facility] Adult PT Treatment/Exercise - 10/22/17 0808      Transfers   Transfers  Sit to Stand;Stand to Sit    Sit to Stand  6: Modified independent (Device/Increase time);5: Supervision;With upper extremity assist;From bed Mod I from her personal rollator walker    Sit to Stand Details  Verbal cues for sequencing Cues for unlocking/locking brakes hand placement w/ UPwalker    Stand to Sit  6: Modified independent (Device/Increase time);5: Supervision;With upper extremity assist;To bed    Stand to Sit Details (indicate cue type and reason)  Verbal cues for sequencing;Tactile cues for sequencing Cues for locking brakes, hand placement with UPwalker    Number of Reps  -- 5 reps throughout session      Ambulation/Gait   Ambulation/Gait  Yes    Ambulation/Gait Assistance  5: Supervision    Ambulation/Gait Assistance Details  Trials  of UPWalker during therapy session today.      Ambulation Distance (Feet)  125 Feet 120 ft with UPWalker; 100 ft, 120 ft with rollator    Assistive device  Rollator UpWalker    Gait Pattern  Step-through pattern;Decreased dorsiflexion - left;Left genu recurvatum;Poor foot clearance - left;Decreased step length - left;Decreased hip/knee flexion - left;Trunk flexed    Ambulation Surface  Level;Indoor    Gait Comments  Trial of UPWalker, with pt noted to have more upright posture and stays closer to walker's BOS; however, pt tends to be forcefull with brakes and L forearm slips out of forearm tray mutliple times (does not appear to be able to be adjusted to accomodate pt's preferred LUE positioning).  With gait with rollator walker, pt pushes rollator far in front of her BOS and does not come closer to BOS of walker despite cueing (pt reports being fearful that her knees will hit seat of rollator).  Discussed  with pt and caregiver, need for cueing to take standing breaks, to come closer to walker and stand tall prior to initiating gait again.      Self-Care   Self-Care  Other Self-Care Comments    Other Self-Care Comments   Discussed options for UPWalker versus current rollator walker.  PT voices concerns about UPWalker including positioning issues with LUE, sturdiness of brakes, UE support due to pt's more forceful nature in handling brakes and UE support during trials, no strap in seat area which is safety concern for when patient uses it as a seat, and hand placement during transfers.  Agreed with patient/caregiver that Bonne Dolores is likely not a viable, safe, sturdy option for how patient tends to ambulate.  Briefly looked into other tall/upright walker options and came upon Motivo walker system.  Looked at website with patient and caregiver, and caregiver interested enough that they may likely call to pursue 30-day trial.  This therapist unfamiliar with Motivo walker system and cannot specifically make recommendation regarding it, but PT agreeable to pt/caregiver calling to inquire about 30-day trial within group home.  PT discussed POC, including potential HEP to address posture, lower extremity strength.  Pt adamantly opposed to doing exercise ("I've done som many exercises and they all hurt my knees.  Won't do any more, just I like walking.")  PT will not address exercise as part of POC per pt's response.             PT Education - 10/22/17 0911    Education provided  Yes    Education Details  UPWalker not a safe, viable option for gait; continue to use rollator walker; brief standing rest breaks; Motivo walker system as option-with caregiver to pursue; discussed POC/exercises-pt does not want to pursue HEP    Person(s) Educated  Patient;Caregiver(s) [name]    Methods  Explanation;Demonstration;Handout    Comprehension  Verbalized understanding          PT Long Term Goals - 10/22/17 0917       PT LONG TERM GOAL #1   Title  Pt will perform HEP to address strength, posture, balance with caregiver's assistance.  TARGET 11/02/17    Baseline  Pt strongly refuses to perform any exercises; therefore will not issue HEP.    Time  4    Period  Weeks    Status  Not Met      PT LONG TERM GOAL #2   Title  Pt will ambulate at least 150 ft using appropriate assistive device (rollator walker  versus UP walker) with caregiver supervision.    Time  4    Period  Weeks    Status  New      PT LONG TERM GOAL #3   Title  Pt/caregiver will verbalize understanding of fall prevention in the home environment.    Time  4    Period  Weeks    Status  New            Plan - 10/22/17 0914    Clinical Impression Statement  Pt has cancelled several appointments, but returns today for therapy.  Trialed UPWalker again, and ultimately, based on pt's cognition, decreased safety awareness, sturdiness of UpWalker, PT recommends that Bonne Dolores is not a viable, safe option and to stay with rollator.  In discussing options, looked at [facility] walker system on website and caregiver interested in pursuing 30-day trial in group home.  Discussed exercises for HEP, and patient refuses to do any exercises-discontinue HEP goal.  Will see patient one additional visit to follow up on 30-day trial of Motivo system and fall prevention.    Rehab Potential  Good    PT Frequency  1x / week    PT Duration  4 weeks plus eval    PT Treatment/Interventions  ADLs/Self Care Home Management;Gait training;DME Instruction;Functional mobility training;Therapeutic activities;Therapeutic exercise;Balance training;Neuromuscular re-education;Patient/family education;Orthotic Fit/Training    PT Next Visit Plan  Fall prevention education; any further gait recommendations with rollator; follow-up on potential trial of Motivo wlaker system    Consulted and Agree with Plan of Care  Patient;Family member/caregiver    Family Member Consulted   [name] from group home       Patient will benefit from skilled therapeutic intervention in order to improve the following deficits and impairments:  Abnormal gait, Decreased balance, Decreased mobility, Decreased safety awareness, Decreased strength, Difficulty walking, Postural dysfunction  Visit Diagnosis: Other abnormalities of gait and mobility  Abnormal posture     Problem List Patient Active Problem List   Diagnosis Date Noted  . Oral thrush 02/28/2012  . ALOPECIA 01/04/2010  . ALLERGIC RHINITIS 07/17/2008  . PULMONARY NODULE 07/17/2008  . ENLARGEMENT OF LYMPH NODES 07/17/2008  . Cough 07/17/2008    Demetrios Byron W. 10/22/2017, 9:19 AM  Frazier Butt., PT   [facility] [address] [place], [place], [address] Phone: [phone]   Fax:  [phone]  Name: SHEALYN SEAN MRN: [id number] Date of Birth: [birth year]

## 2017-11-02 ENCOUNTER — Ambulatory Visit: Payer: Medicare Other | Admitting: Physical Therapy

## 2017-11-02 ENCOUNTER — Encounter: Payer: Self-pay | Admitting: Physical Therapy

## 2017-11-02 DIAGNOSIS — R293 Abnormal posture: Secondary | ICD-10-CM

## 2017-11-02 DIAGNOSIS — R2689 Other abnormalities of gait and mobility: Secondary | ICD-10-CM | POA: Diagnosis not present

## 2017-11-02 NOTE — Therapy (Signed)
[facility] [address] [place], [place], [address] Phone: [phone]   Fax:  [phone]  Physical Therapy Treatment  Patient Details  Name: Nicole [name] MRN: [id number] Date of Birth: Feb 21, 1951 Referring Provider: Nonda Lou   Encounter Date: 11/02/2017  PT End of Session - 11/02/17 1932    Visit Number  3    Number of Visits  5    Date for PT Re-Evaluation  11/30/17    Authorization Type  Medicare-GCODE eval and every 10th visit    PT Start Time  0850    PT Stop Time  0934    PT Time Calculation (min)  44 min    Equipment Utilized During Treatment  Gait belt    Activity Tolerance  Patient tolerated treatment well    Behavior During Therapy  [facility] for tasks assessed/performed       Past Medical History:  Diagnosis Date  . 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 Allergic rhinitis   . Alopecia   . Discoid lupus   . Hyperlipidemia   . Hypertension   . Menopause   . Moderate mental retardation   . Obesity   . Osteoporosis   . Pulmonary nodules   . Seizures ([place])    focal-being by Dr. Erling Cruz (should not take anti-histamines)    Past Surgical History:  Procedure Laterality Date  . VESICOVAGINAL FISTULA CLOSURE W/ TAH      There were no vitals filed for this visit.  Subjective Assessment - 11/02/17 0854    Subjective  Nicole [name], caregiver, is present and states they would like to purse Motivo walker, perhaps through CAP program.  He requests therapist to write letter of medical necessity.  Patient in agreement.    Patient is accompained by:  -- Caregiver, Nicole [name]    Pertinent History  chronic L leg/knee pain, discoid lupus, obesity, hyperlipidemia, osteoporosis, congenital L hemiparesis and childhood epilepsy, seizure disorder, moderate MR    Patient Stated Goals  Want to try to get answers about a walker that would be good for her.    Currently in Pain?  No/denies    Pain Onset  More than a  month ago         [facility] PT Assessment - 11/02/17 0900      6 Minute Walk- Baseline   6 Minute Walk- Baseline  yes    HR (bpm)  80    02 Sat (%RA)  98 %      6 Minute walk- Post Test   6 Minute Walk Post Test  yes    HR (bpm)  102    02 Sat (%RA)  99 %      6 minute walk test results    Aerobic Endurance Distance Walked  313    Endurance additional comments  313 ft with rollator walker, with pt taking seated rest break for 2:30.  Attempted 6 min walk test with UP walker (as comparison to Motivo to be closer to walker's BOS); however, pt fatigued and asks to stop just under 3 minutes.                  [facility] PT Treatment/Exercise - 11/02/17 0900      Transfers   Transfers  Sit to Stand;Stand to Sit    Sit to Stand  6: Modified independent (Device/Increase time);5: Supervision;With upper extremity assist;From bed    Stand to Sit  6: Modified independent (  Device/Increase time);5: Supervision;With upper extremity assist;To bed    Number of Reps  -- 5 reps throughout session with walking breaks, TUG measure      Ambulation/Gait   Ambulation/Gait  Yes    Ambulation/Gait Assistance  5: Supervision    Ambulation Distance (Feet)  313 Feet with rollator walker, then 131 ft with [employer] device  Rollator UpWalker as comparison for getting closer to walker BOS    Gait Pattern  Step-through pattern;Decreased dorsiflexion - left;Left genu recurvatum;Poor foot clearance - left;Decreased step length - left;Decreased hip/knee flexion - left;Trunk flexed;Step-to pattern    Ambulation Surface  Level;Indoor    Gait velocity  25.18 sec = 1.3 ft/sec    Gait Comments  313 ft in 6 minutes 131 ft in jsut under 3 minute with UPwalker      Standardized Balance Assessment   Standardized Balance Assessment  Timed Up and Go Test      Timed Up and Go Test   TUG  Normal TUG    Normal TUG (seconds)  23.78 rollator walker ; performed additional TUG as trial     Self-Care    Self-Care  Other Self-Care Comments    Other Self-Care Comments   Discussed fall prevention education for home environment.  Discussed progress towards PT goals and patient in agreement to pursue Motivo rollator walker (through CAP program and has requested LMN from PT)      Total first bout of gait was 385 ft with rollator walker        PT Education - 11/02/17 1932    Education provided  Yes    Education Details  Fall prevention    Person(s) Educated  Patient;Caregiver(s) Nicole [name]    Methods  Explanation;Handout    Comprehension  Verbalized understanding          PT Long Term Goals - 11/02/17 1934      PT LONG TERM GOAL #1   Title  Pt will perform HEP to address strength, posture, balance with caregiver's assistance.  TARGET 11/02/17    Baseline  Pt strongly refuses to perform any exercises; therefore will not issue HEP.    Time  4    Period  Weeks    Status  Not Met      PT LONG TERM GOAL #2   Title  Pt will ambulate at least 150 ft using appropriate assistive device (rollator walker versus UP walker) with caregiver supervision.    Baseline  at d/c still using rollator walker    Time  4    Period  Weeks    Status  Achieved      PT LONG TERM GOAL #3   Title  Pt/caregiver will verbalize understanding of fall prevention in the home environment.    Time  4    Period  Weeks    Status  Achieved            Plan - 11/02/17 1935    Clinical Impression Statement  Pt returns to therapy today for planned last visit.  Skilled PT session focused on fall prevention education and gait training/assessment, as pt's caregiver reports the CAP program may be able to cover the Motivo walking system.  Therefore, attempted several walking measures with rollator walker and with UP walker (as a system that allows patient to stay more upright and closer to walker similar to [facility]);  pt fatigues at end of session and long distance walking measures not fully completed.  However,  pt has  demonstrated overall imrpoved gait velocity.  Pt does tend to have forward flexed posture after 20-30 ft using rollator walker, where with UP walker, pt is able to stay upright longer.  Unsure if this is due to more inset seat depth (similar to Motivo) or upright arm posture.  Pt may benefit from trial of Motivo to see if she is able to maintain upright walking posture for longer distances with gait.  Pt is appropriate for discharge this visit.    Rehab Potential  Good    PT Frequency  1x / week    PT Duration  4 weeks plus eval    PT Treatment/Interventions  ADLs/Self Care Home Management;Gait training;DME Instruction;Functional mobility training;Therapeutic activities;Therapeutic exercise;Balance training;Neuromuscular re-education;Patient/family education;Orthotic Fit/Training    PT Next Visit Plan  Letter of medical necessity for CAP request to try to get patient Motivo walker system.  D/C this visit.    Consulted and Agree with Plan of Care  Patient;Family member/caregiver    Family Member Consulted  Nicole [name] from group home       Patient will benefit from skilled therapeutic intervention in order to improve the following deficits and impairments:  Abnormal gait, Decreased balance, Decreased mobility, Decreased safety awareness, Decreased strength, Difficulty walking, Postural dysfunction  Visit Diagnosis: Other abnormalities of gait and mobility  Abnormal posture   G-Codes - November 11, 2017 1939    Functional Assessment Tool Used (Outpatient Only)  1.3 ft/sec with rollator; TUG 23.78 sec    Functional Limitation  Mobility: Walking and moving around    Mobility: Walking and Moving Around Goal Status [phone])  At least 40 percent but less than 60 percent impaired, limited or restricted    Mobility: Walking and Moving Around Discharge Status [phone])  At least 40 percent but less than 60 percent impaired, limited or restricted       Problem List Patient Active Problem List   Diagnosis Date Noted   . Oral thrush 02/28/2012  . ALOPECIA 01/04/2010  . ALLERGIC RHINITIS 07/17/2008  . PULMONARY NODULE 07/17/2008  . ENLARGEMENT OF LYMPH NODES 07/17/2008  . Cough 07/17/2008    Jilliam Bellmore W. 11-11-17, 7:40 PM  Frazier Butt., PT   [facility] [address] [place], [place], [address] Phone: [phone]   Fax:  [phone]  Name: SIRIA CALANDRO MRN: [id number] Date of Birth: Dec 29, 1950

## 2017-11-15 ENCOUNTER — Encounter (HOSPITAL_BASED_OUTPATIENT_CLINIC_OR_DEPARTMENT_OTHER): Payer: Self-pay | Admitting: *Deleted

## 2017-11-15 ENCOUNTER — Emergency Department (HOSPITAL_BASED_OUTPATIENT_CLINIC_OR_DEPARTMENT_OTHER)
Admission: EM | Admit: 2017-11-15 | Discharge: 2017-11-15 | Disposition: A | Discharge status: Home / Self Care | Payer: Medicare Other | Attending: Physician Assistant | Admitting: Physician Assistant

## 2017-11-15 ENCOUNTER — Other Ambulatory Visit: Payer: Self-pay

## 2017-11-15 ENCOUNTER — Emergency Department (HOSPITAL_BASED_OUTPATIENT_CLINIC_OR_DEPARTMENT_OTHER): Payer: Medicare Other

## 2017-11-15 DIAGNOSIS — Y998 Other external cause status: Secondary | ICD-10-CM | POA: Diagnosis not present

## 2017-11-15 DIAGNOSIS — I10 Essential (primary) hypertension: Secondary | ICD-10-CM | POA: Insufficient documentation

## 2017-11-15 DIAGNOSIS — S0083XA Contusion of other part of head, initial encounter: Secondary | ICD-10-CM | POA: Insufficient documentation

## 2017-11-15 DIAGNOSIS — Y9389 Activity, other specified: Secondary | ICD-10-CM | POA: Diagnosis not present

## 2017-11-15 DIAGNOSIS — S0990XA Unspecified injury of head, initial encounter: Secondary | ICD-10-CM | POA: Diagnosis present

## 2017-11-15 DIAGNOSIS — Z23 Encounter for immunization: Secondary | ICD-10-CM | POA: Diagnosis not present

## 2017-11-15 DIAGNOSIS — Y929 Unspecified place or not applicable: Secondary | ICD-10-CM | POA: Insufficient documentation

## 2017-11-15 DIAGNOSIS — W19XXXA Unspecified fall, initial encounter: Secondary | ICD-10-CM | POA: Diagnosis not present

## 2017-11-15 DIAGNOSIS — Z79899 Other long term (current) drug therapy: Secondary | ICD-10-CM | POA: Insufficient documentation

## 2017-11-15 MED ORDER — TETANUS-DIPHTH-ACELL PERTUSSIS 5-2.5-18.5 LF-MCG/0.5 IM SUSP
0.5000 mL | Freq: Once | INTRAMUSCULAR | Status: AC
  Administered 2017-11-15: 0.5 mL via INTRAMUSCULAR
  Filled 2017-11-15: qty 0.5

## 2017-11-15 NOTE — ED Provider Notes (Signed)
[facility] EMERGENCY DEPARTMENT Provider Note   CSN: [id number] Arrival date & time: 11/15/17  1156     History   Chief Complaint Chief Complaint  Patient presents with  . Fall    HPI Nicole [name] is a 66 y.o. female.  HPI  Patient is a 66 year old female presenting with fall.  Patient is here with a caretaker.  Patient fell backwards because she was adjusting her sock.  She hit her back of her head.  Patient has small hematoma.  No loss of consciousness no vomiting.  Patient is normal state of health currently.    Past Medical History:  Diagnosis Date  . 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 Allergic rhinitis   . Alopecia   . Discoid lupus   . Hyperlipidemia   . Hypertension   . Menopause   . Moderate mental retardation   . Obesity   . Osteoporosis   . Pulmonary nodules   . Seizures ([facility])    focal-being by Dr. Sandria ManlyLove (should not take anti-histamines)    Patient Active Problem List   Diagnosis Date Noted  . Oral thrush 02/28/2012  . ALOPECIA 01/04/2010  . ALLERGIC RHINITIS 07/17/2008  . PULMONARY NODULE 07/17/2008  . ENLARGEMENT OF LYMPH NODES 07/17/2008  . Cough 07/17/2008    Past Surgical History:  Procedure Laterality Date  . VESICOVAGINAL FISTULA CLOSURE W/ TAH      OB History    No data available       Home Medications    Prior to Admission medications   Medication Sig Start Date End Date Taking? Authorizing Provider  azelastine (ASTELIN) 137 MCG/SPRAY nasal spray Place 1 spray into the nose at bedtime. Use in each nostril as directed    [provider]  benzonatate (TESSALON) 100 MG capsule Take by mouth daily. Every 6 hours as needed    [provider]  busPIRone (BUSPAR) 15 MG tablet Take 15 mg by mouth 2 (two) times daily.      [provider]  calcium carbonate (OS-CAL) 600 MG TABS tablet Take by mouth.    [provider]  carbamazepine (TEGRETOL XR) 100 MG 12 hr tablet Take 3  tablets (300 mg total) by mouth 2 (two) times daily. Reducing due to low sodium levels recently. 06/19/17   Huston FoleyAthar, Saima, MD  Cholecalciferol (VITAMIN D3) 2000 units TABS Take by mouth.    [provider]  diclofenac (VOLTAREN) 75 MG EC tablet  07/28/16   [provider]  gabapentin (NEURONTIN) 300 MG capsule Take 1 capsule (300 mg total) by mouth as directed. Taken tid on weekends, taken qid on weekdays. 09/30/13   Kalman Shanamaswamy, Murali, MD  levETIRAcetam (KEPPRA) 500 MG tablet Take 1 tablet (500 mg total) by mouth 2 (two) times daily. 04/05/17   Huston FoleyAthar, Saima, MD  lisinopril (PRINIVIL,ZESTRIL) 20 MG tablet 20 mg daily. 10/04/17   [provider]  mometasone (ASMANEX 60 METERED DOSES) 220 MCG/INH inhaler Inhale 2 puffs into the lungs daily. 09/02/12   Kalman Shanamaswamy, Murali, MD  Multiple Vitamins-Minerals (CERTAVITE/ANTIOXIDANTS) TABS Take by mouth. Advance , daily    [provider]  pantoprazole (PROTONIX) 40 MG tablet  08/04/13   [provider]  Selenium 200 MCG TBCR Take 1 tablet by mouth daily.    [provider]  Wheat Dextrin (BENEFIBER) CHEW Chew 1 tablet by mouth 2 (two) times daily.      [provider]    [facility]  History Family History  Problem Relation Age of Onset  . Breast cancer Mother     Social History Social History   Tobacco Use  . Smoking status: Never Smoker  . Smokeless tobacco: Never Used  Substance Use Topics  . Alcohol use: No  . Drug use: No     Allergies   Seasonal ic [cholestatin]   Review of Systems Review of Systems  Constitutional: Negative for activity change.  Respiratory: Negative for shortness of breath.   Cardiovascular: Negative for chest pain.  Gastrointestinal: Negative for abdominal pain.     Physical Exam Updated Vital Signs BP 116/86   Pulse 81   Temp 97.7 F (36.5 C) (Oral)   Resp 20   Ht 5\' 7"  (1.702 m)   Wt 78.9 kg (174 lb)   SpO2 100%   BMI 27.25 kg/m   Physical Exam    Constitutional: She appears well-developed and well-nourished.  HENT:  Head: Normocephalic.  Small hemaoma to back of head. Not bleeidng, no laceration.s  Eyes: Right eye exhibits no discharge. Left eye exhibits no discharge.  Cardiovascular: Normal rate, regular rhythm and normal heart sounds.  No murmur heard. Pulmonary/Chest: Effort normal and breath sounds normal. She has no wheezes. She has no rales.  Neurological: Sensory deficit: .now.  Equal strength bilaterally upper and lower extremities Normal sensation bilaterally. Speech comprehensible, no slurring. Facial nerve tested and appears grossly normal. Alert and oriented   Skin: Skin is warm and dry. She is not diaphoretic.  Psychiatric: She has a normal mood and affect.  Nursing note and vitals reviewed.    ED Treatments / Results  Labs (all labs ordered are listed, but only abnormal results are displayed) Labs Reviewed - No data to display  EKG  EKG Interpretation None       Radiology No results found.  Procedures Procedures (including critical care time)  Medications Ordered in ED Medications - No data to display   Initial Impression / Assessment and Plan / ED Course  I have reviewed the triage vital signs and the nursing notes.  Pertinent labs & imaging results that were available during my care of the patient were reviewed by me and considered in my medical decision making (see chart for details).     Patient is a [age] year old female presenting with fall.  Patient is here with a caretaker.  Patient fell backwards because she was adjusting her sock.  She hit her back of her head.  Patient has small hematoma.  No loss of consciousness no vomiting.  Patient is normal state of health currently.  Will get CT, discharge home given normal neurologic exam.   2:42 PM CT showed no evidence of trauma related injury.  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  Discussed with caregiver, she has not been  falling more than usual.  We will have her follow-up with primary care for further workup.  Final Clinical Impressions(s) / ED Diagnoses   Final diagnoses:  None    ED Discharge Orders    None       Abelino DerrickMackuen, Marrianne Sica Lyn, MD 11/15/17 1442

## 2017-11-15 NOTE — ED Triage Notes (Signed)
She slipped and fell this am hitting the back of her head. Hematoma felt. No LOC. She normally walks with a walker.

## 2017-11-15 NOTE — Discharge Instructions (Signed)
Your scan shows no evidence of problems after the fall.  However does show that she could have a little bit of what is called normal pressure hydrocephalus.  Please follow-up with your primary care physician to further evaluate.  I have attached your CT findings below.   CLINICAL DATA:  Slipped and fell this morning.  EXAM: CT HEAD WITHOUT CONTRAST  TECHNIQUE: Contiguous axial images were obtained from the base of the skull through the vertex without intravenous contrast.  COMPARISON:  07/06/2013  FINDINGS: Brain: No evidence of acute infarction, hemorrhage, extra-axial collection or mass lesion/mass effect. Ventriculomegaly disproportionate to the degree of cerebral atrophy. Periventricular white matter low attenuation. Periventricular white matter dystrophic calcifications likely secondary to prior infection.  Vascular: No hyperdense vessel. Intracranial atherosclerotic disease.  Skull: No osseous abnormality.  Sinuses/Orbits: Visualized paranasal sinuses are clear. Visualized mastoid sinuses are clear. Visualized orbits demonstrate no focal abnormality.  Other: Small left parietal scalp hematoma.  IMPRESSION: 1. No acute intracranial pathology. 2. Ventriculomegaly slightly disproportionate to the degree of cerebral atrophy. This appearance can be seen with normal pressure hydrocephalus. Correlate with clinical exam.

## 2017-11-20 ENCOUNTER — Other Ambulatory Visit: Payer: Self-pay | Admitting: Nurse Practitioner

## 2017-11-20 DIAGNOSIS — Z1231 Encounter for screening mammogram for malignant neoplasm of breast: Secondary | ICD-10-CM

## 2017-12-19 ENCOUNTER — Ambulatory Visit
Admission: RE | Admit: 2017-12-19 | Discharge: 2017-12-19 | Disposition: A | Discharge status: Home / Self Care | Payer: Medicare Other | Source: Ambulatory Visit | Attending: Nurse Practitioner | Admitting: Nurse Practitioner

## 2017-12-19 DIAGNOSIS — Z1231 Encounter for screening mammogram for malignant neoplasm of breast: Secondary | ICD-10-CM

## 2018-04-08 ENCOUNTER — Other Ambulatory Visit: Payer: Self-pay | Admitting: Nurse Practitioner

## 2018-04-08 DIAGNOSIS — E2839 Other primary ovarian failure: Secondary | ICD-10-CM

## 2018-04-10 ENCOUNTER — Encounter: Payer: Self-pay | Admitting: Neurology

## 2018-04-10 ENCOUNTER — Ambulatory Visit (INDEPENDENT_AMBULATORY_CARE_PROVIDER_SITE_OTHER): Payer: Medicare Other | Admitting: Neurology

## 2018-04-10 VITALS — BP 141/80 | HR 70

## 2018-04-10 DIAGNOSIS — G808 Other cerebral palsy: Secondary | ICD-10-CM

## 2018-04-10 DIAGNOSIS — D72818 Other decreased white blood cell count: Secondary | ICD-10-CM

## 2018-04-10 DIAGNOSIS — Z5181 Encounter for therapeutic drug level monitoring: Secondary | ICD-10-CM

## 2018-04-10 DIAGNOSIS — R269 Unspecified abnormalities of gait and mobility: Secondary | ICD-10-CM | POA: Diagnosis not present

## 2018-04-10 DIAGNOSIS — G9349 Other encephalopathy: Secondary | ICD-10-CM

## 2018-04-10 DIAGNOSIS — D708 Other neutropenia: Secondary | ICD-10-CM

## 2018-04-10 DIAGNOSIS — R569 Unspecified convulsions: Secondary | ICD-10-CM | POA: Diagnosis not present

## 2018-04-10 DIAGNOSIS — E871 Hypo-osmolality and hyponatremia: Secondary | ICD-10-CM | POA: Diagnosis not present

## 2018-04-10 DIAGNOSIS — G40909 Epilepsy, unspecified, not intractable, without status epilepticus: Secondary | ICD-10-CM | POA: Diagnosis not present

## 2018-04-10 MED ORDER — CARBAMAZEPINE ER 100 MG PO TB12: 300.0000 mg | ORAL_TABLET | Freq: Two times a day (BID) | ORAL | 5 refills | Status: AC

## 2018-04-10 MED ORDER — LEVETIRACETAM 500 MG PO TABS: 500.0000 mg | ORAL_TABLET | Freq: Two times a day (BID) | ORAL | 5 refills | Status: AC

## 2018-04-10 NOTE — Progress Notes (Signed)
Subjective:    Patient ID: REMINGTYN DEPAOLA is a 67 y.o. female.  HPI     Interim history:   Ms. Enriques is a 67 year old right-handed woman with an underlying medical history of allergic rhinitis, obesity, discoid lupus, alopecia, hyperlipidemia, pulmonary nodules, , osteoporosis, gait disorder, congenital left hemiparesis and childhood epilepsy, who presents for follow-up consultation of her seizure disorder, complicated by mild chronic hyponatremia and mild chronic low white cell count, which have been stable and deemed secondary to medication. She is accompanied by her caregiver, [name] from her group home and her sister, Golda Acre today. I last saw her on 04/05/2017, at which time she had issues with a left knee pain. No seizure activity was reported, she had stable mood and no recent behavioral issues. We checked her labs at the time and sodium and white cell count were stable.  She saw Ward Givens, nurse practitioner in the interim on 10/08/2017, at which time she was clinically stable, labs were stable as well. She was advised to continue with Keppra and carbamazepine.   Today, 04/10/2018 (all dictated new, as well as above notes, some dictation done in note pad or Word, outside of chart, may appear as copied):  She reports doing all right, no recent seizure activity, recently took a trip with her sister to [place]. Sister lives locally here but also has a place in [place]. Patient had a syncopal spell while at her sister's house, after taking a shower. Thankfully, no major injuries, was unresponsive for a few seconds, no postictal state, no convulsive movements. Of note, she was seen in the emergency room in December 2018 after a fall. She had a head CT without contrast on 11/15/2017 and I reviewed the results: IMPRESSION: 1. No acute intracranial pathology. 2. Ventriculomegaly slightly disproportionate to the degree of cerebral atrophy. This appearance can be seen with normal  pressure hydrocephalus. Correlate with clinical exam.   Per sister, since she requires more observation with supervision even overnight, they are looking at different group home were 24/7 supervision is available.  The patient's allergies, current medications, family history, past medical history, past social history, past surgical history and problem list were reviewed and updated as appropriate.    Previously (copied from previous notes for reference):   I saw her on 07/31/2016, at which time she reported getting more irritated with her caretakers. She had been working with one of the female caretakers one-on-one. [name] reported recent behavioral issues. There were no recent seizure reports, one spell of dizziness was reported. She had one fall without head injury reported. Exam was stable, we checked her labs and Tegretol level was within therapeutic window and her hyponatremia was stable, low white cell count was also stable for her. We called [name] with the test results at the time. I suggested we continue with her medication and follow-up 6 monthly. I suggested we continue with long-acting Tegretol 400 mg twice daily and generic Keppra 500 mg twice daily. I suggested they consider referral to psychiatry for her mood issues and behavioral issues.   I saw her on 01/06/2016, at which time she was stable. She did take one fall when she was at her sister's house, she slipped on the bathroom floor but thankfully did not hurt herself or hit her head or lost consciousness. She did sustain a bruised knee. Blood work showed a white cell count at 2.7, sodium level at 132, both lower than normal but stable for her. I changed her Keppra to generic  and we continued the carbamazepine.   I saw her on 07/08/2015, at which time she reported feeling stable. She was compliant with her antiepileptic medications. She had no recent seizures or side effects. We checked blood work which showed normal sodium level and WBC  of 2.8, which is below normal but not unusual for her. I suggested her PCP recheck it in a couple months to be sure.   I saw her on 12/21/2014, at which time she reported doing well. There was no interim history of seizures. We did blood work and called her with her results. Her sodium level was 132 but stable. Her white cell count was low but stable. I suggested we continue with her current medication regimen, including Keppra and Tegretol.   I saw her on 08/13/2013, at which time her caregiver reported that she was stable. She was using a rolling walker. I checked blood work and we called her group home with the results. Her sodium and chloride levels were stable. Her white count was low but stable at 2.7. Keppra level was within therapeutic range and Tegretol level was high so I decreased long-acting Tegretol to 400 mg twice daily as opposed to 400 in the morning and 800 and night. Keppra stayed at 500 mg twice daily. Of note, she no showed for an appointment on 02/10/2014.   She previously followed with Dr. Jeneen Rinks love and I first met her on 02/06/2013, at which time I checked her sodium level. She has had increasing falls. 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 She has a history of static encephalopathy. She has had hyponatremia repetitively the past. I proceeded with a chest x-ray because I had heard some crackles on auscultation. We check blood work which did show hyponatremia and adjusted her medication. She was recently seen in the emergency room on 07/06/2013 after a fall with head injury. She had a laceration to the back of her head. She received 5 staples. She had a head and cervical spine CT on 07/06/13: Stable ventriculomegaly and periventricular calcifications. Ventriculomegaly felt to most likely be related to white matter disease and volume loss. No  acute findings. Degenerative changes and cervical straightening. No acute bony abnormality.  Her Past Medical History Is Significant For: Past Medical History:  Diagnosis Date  . 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 Allergic rhinitis   . Alopecia   . Discoid lupus   . Hyperlipidemia   . Hypertension   . Menopause   . Moderate mental retardation   . Obesity   . Osteoporosis   . Pulmonary nodules   . Seizures ([place])    focal-being by Dr. Erling Cruz (should not take anti-histamines)    Her Past Surgical History Is Significant For: Past Surgical History:  Procedure Laterality Date  . VESICOVAGINAL FISTULA CLOSURE W/ TAH      Her Family History Is Significant For: Family History  Problem Relation Age of Onset  . Breast cancer Mother     Her Social History Is Significant For: Social History   Socioeconomic History  . Marital status: Single    Spouse name: Not on file  . Number of children: Not on file  . Years of education: Not on file  . Highest education level: Not on file  Occupational History  . Occupation: Programmer, applications: DISABLED  Comment: at [employer] (places boxes on pallete-4h/week)  Social Needs  . Financial resource strain: Not on file  . Food insecurity:    Worry: Not on file    Inability: Not on file  . Transportation needs:    Medical: Not on file    Non-medical: Not on file  Tobacco Use  . Smoking status: Never Smoker  . Smokeless tobacco: Never Used  Substance and Sexual Activity  . Alcohol use: No  . Drug use: No  . Sexual activity: Never  Lifestyle  . Physical activity:    Days per week: Not on file    Minutes per session: Not on file  . Stress: Not on file  Relationships  . Social connections:    Talks on phone: Not on file    Gets together: Not on file    Attends religious service: Not on file    Active member of club or organization: Not on file    Attends meetings of clubs or organizations: Not on file     Relationship status: Not on file  Other Topics Concern  . Not on file  Social History Narrative   [place] [phone]    Her Allergies Are:  Allergies  Allergen Reactions  . Seasonal Ic [Cholestatin]     Runny nose  :   Her Current Medications Are:  Outpatient Encounter Medications as of 04/10/2018  Medication Sig  . azelastine (ASTELIN) 137 MCG/SPRAY nasal spray Place 1 spray into the nose at bedtime. Use in each nostril as directed  . benzonatate (TESSALON) 100 MG capsule Take by mouth daily. Every 6 hours as needed  . busPIRone (BUSPAR) 15 MG tablet Take 15 mg by mouth 2 (two) times daily.    . calcium carbonate (OS-CAL) 600 MG TABS tablet Take by mouth.  . carbamazepine (TEGRETOL XR) 100 MG 12 hr tablet Take 3 tablets (300 mg total) by mouth 2 (two) times daily. Reducing due to low sodium levels recently.  . Cholecalciferol (VITAMIN D3) 2000 units TABS Take by mouth.  . diclofenac (VOLTAREN) 75 MG EC tablet   . gabapentin (NEURONTIN) 300 MG capsule Take 1 capsule (300 mg total) by mouth as directed. Taken tid on weekends, taken qid on weekdays.  Marland Kitchen levETIRAcetam (KEPPRA) 500 MG tablet Take 1 tablet (500 mg total) by mouth 2 (two) times daily.  Marland Kitchen losartan (COZAAR) 100 MG tablet Take 100 mg by mouth daily.  . mometasone (ASMANEX 60 METERED DOSES) 220 MCG/INH inhaler Inhale 2 puffs into the lungs daily.  . Multiple Vitamins-Minerals (CERTAVITE/ANTIOXIDANTS) TABS Take by mouth. Advance , daily  . pantoprazole (PROTONIX) 40 MG tablet   . Selenium 200 MCG TBCR Take 1 tablet by mouth daily.  . Wheat Dextrin (BENEFIBER) CHEW Chew 1 tablet by mouth 2 (two) times daily.    . [DISCONTINUED] lisinopril (PRINIVIL,ZESTRIL) 20 MG tablet 20 mg daily.   No facility-administered encounter medications on file as of 04/10/2018.   :  Review of Systems:  Out of a complete 14 point review of systems, all are reviewed and negative with the exception of these symptoms as listed below: Review of  Systems  Neurological:       Pt presents today to discuss her seizures. Pt denies any recent seizure activity.    Objective:  Neurological Exam  Physical Exam Physical Examination:   Vitals:   04/10/18 0823  BP: (!) 141/80  Pulse: 70   General Examination: The patient is a very pleasant 67 y.o.  female in no acute distress. She appears well-developed and well-nourished and well groomed. Good spirits. In [facility] today. Did not bring her walker.   HEENT: Normocephalic, atraumatic, Right pupil is reactive to light, she is blind in the left eye, has corrective eyeglasses, tracking is occult for her. She has unchanged mild dysarthria, otherwise benign findings on oropharynx exam with the exception of mild to moderate mouth dryness noted.   Chest: Clear to auscultation without wheezing, rhonchi or crackles noted.  Heart: S1+S2+0, regular and normal without murmurs, rubs or gallops noted.   Abdomen: Soft, non-tender and non-distended with normal bowel sounds appreciated on auscultation.  Extremities: There is puffiness noted in both feet.  Skin: Warm and dry without trophic changes noted.  Musculoskeletal: exam reveals no obvious joint deformities, tenderness or joint swelling or erythema.   Neurologically: Mental status: The patient is awake, alert and oriented in all 3 spheres. Her memory, attention, fund of knowledge are mildly impaired, unchanged from before, stable. good spirits today.  Motor exam: Her left leg has a smaller caliber than her right leg, unchanged. Tone is mildly spastic in the left upper and left lower extremity. She has mild weakness on the left side, especially in her grip, right side is normal. Overall motor and fine motor exam stable from before. She does have mild incoordination on the left. Sensory exam is also difficult to assess, normal to LT. She did not bring her walker.    Assessment and Plan:   In summary, Ms. Geissinger is a 67 year old woman with a  complex history including congenital left hemiparesis, seizure disorder, static encephalopathy and known hyponatremia as well as low white count - usually stable - deemed secondary to chronic antiepileptic medication, which has been kept the same, as she has been stable Sz-wise and lab wise. She presents for follow-up her 6 monthly FU visit. She has a long-standing history of seizures, without recent evidence of seizure activity, but recent syncopal spell while visiting with her sister in [place]. Exam and labs have been stable usually on a 6 monthly basis. She has been on long-acting Tegretol 300 mg twice daily and Keppra generic 500 mg twice daily. she has had occasional mood irritability but no serious issues lately. She has had some falls. They are looking into transitioning her to a different group home where she can have 24-7 supervision. From my end of things, we will check blood work today including Tegretol level, Keppra level, CBC with differential and CMP. We will call [name] with the test results as usual. She is encouraged to continue to drink electrolyte water, Gatorade and tomato juice, 1-2 servings of either of these per day to help maintain sodium levels. She had a change in her blood pressure medication recently due to cough, she no longer is on lisinopril and started an ARB. I suggested a 6 month follow-up, she can see Jinny Blossom again. I answered all their questions today and the patient and her sister were in agreement.  I spent 20 minutes in total face-to-face time with the patient, more than 50% of which was spent in counseling and coordination of care, reviewing test results, reviewing medication and discussing or reviewing the diagnosis of Sz d/o, its prognosis and treatment options. Pertinent laboratory and imaging test results that were available during this visit with the patient were reviewed by me and considered in my medical decision making (see chart for details).

## 2018-04-10 NOTE — Patient Instructions (Addendum)
We will continue with your 2 seizure meds at the current doses.  Use your walker at all times.  We will check blood work today and call you with the test results. Follow up with Butch Penny, Nurse Practitioner in 6 months.

## 2018-04-11 ENCOUNTER — Telehealth: Payer: Self-pay

## 2018-04-11 DIAGNOSIS — Z5181 Encounter for therapeutic drug level monitoring: Secondary | ICD-10-CM

## 2018-04-11 NOTE — Telephone Encounter (Signed)
-----   Message from Huston Foley, MD sent at 04/11/2018  8:14 AM EDT ----- Her labs are stable, sodium the low-normal range, white cell count lower than we would like to see although she has been low-normal in the past and had been stable in that regard, total white cell count was 2.4 which is in the abnormally low range, not critical but should be rechecked in about a month with primary care physician. Absolute neutrophil count is in the low-normal range so again not critical but something that would need to be rechecked. Please advise Arlys John that they can either do blood work with primary care or we can repeat in one month here, if he can bring her here and we will order accordingly. Huston Foley, MD, PhD [facility])

## 2018-04-11 NOTE — Telephone Encounter (Signed)
I tried both pt's home number and Arlys John (caregiver)'s phone numbers. Home number is still not answering and Brian's number is not available, unable to leave a message.

## 2018-04-11 NOTE — Progress Notes (Signed)
Her labs are stable, sodium the low-normal range, white cell count lower than we would like to see although she has been low-normal in the past and had been stable in that regard, total white cell count was 2.4 which is in the abnormally low range, not critical but should be rechecked in about a month with primary care physician. Absolute neutrophil count is in the low-normal range so again not critical but something that would need to be rechecked. Please advise Arlys John that they can either do blood work with primary care or we can repeat in one month here, if he can bring her here and we will order accordingly. Huston Foley, MD, PhD [facility])

## 2018-04-11 NOTE — Telephone Encounter (Signed)
I called pt to discuss. No answer, left a message at the home number listed to call me back. I also attempted Brian,caregiver's phone number but it is unavailable.

## 2018-04-11 NOTE — Telephone Encounter (Signed)
I called Arlys John, caregiver. I advised him of pt's labs. Arlys John asked if pt could have repeat labs here in one month and asked me to mail the results to him. I verified the pt's address on file is correct. I gave Arlys John our office hours and asked him to bring pt the beginning of June for repeat labs. Arlys John verbalized understanding of results and had no further questions.

## 2018-04-12 LAB — CBC WITH DIFFERENTIAL/PLATELET
Basophils Absolute: 0 10*3/uL (ref 0.0–0.2)
Basos: 0 %
EOS (ABSOLUTE): 0 10*3/uL (ref 0.0–0.4)
Eos: 2 %
Hematocrit: 33.9 % — ABNORMAL LOW (ref 34.0–46.6)
Hemoglobin: 11.5 g/dL (ref 11.1–15.9)
IMMATURE GRANULOCYTES: 0 %
Immature Grans (Abs): 0 10*3/uL (ref 0.0–0.1)
LYMPHS ABS: 0.5 10*3/uL — AB (ref 0.7–3.1)
Lymphs: 19 %
MCH: 31.8 pg (ref 26.6–33.0)
MCHC: 33.9 g/dL (ref 31.5–35.7)
MCV: 94 fL (ref 79–97)
MONOS ABS: 0.4 10*3/uL (ref 0.1–0.9)
Monocytes: 17 %
Neutrophils Absolute: 1.5 10*3/uL (ref 1.4–7.0)
Neutrophils: 62 %
PLATELETS: 268 10*3/uL (ref 150–379)
RBC: 3.62 x10E6/uL — AB (ref 3.77–5.28)
RDW: 13.8 % (ref 12.3–15.4)
WBC: 2.4 10*3/uL — CL (ref 3.4–10.8)

## 2018-04-12 LAB — COMPREHENSIVE METABOLIC PANEL
A/G RATIO: 1.5 (ref 1.2–2.2)
ALK PHOS: 115 IU/L (ref 39–117)
ALT: 10 IU/L (ref 0–32)
AST: 14 IU/L (ref 0–40)
Albumin: 4.4 g/dL (ref 3.6–4.8)
BUN/Creatinine Ratio: 25 (ref 12–28)
BUN: 19 mg/dL (ref 8–27)
Bilirubin Total: 0.3 mg/dL (ref 0.0–1.2)
CALCIUM: 10.1 mg/dL (ref 8.7–10.3)
CO2: 21 mmol/L (ref 20–29)
CREATININE: 0.77 mg/dL (ref 0.57–1.00)
Chloride: 95 mmol/L — ABNORMAL LOW (ref 96–106)
GFR calc Af Amer: 92 mL/min/{1.73_m2} (ref >59)
GFR, EST NON AFRICAN AMERICAN: 80 mL/min/{1.73_m2} (ref >59)
GLOBULIN, TOTAL: 2.9 g/dL (ref 1.5–4.5)
Glucose: 87 mg/dL (ref 65–99)
POTASSIUM: 4.5 mmol/L (ref 3.5–5.2)
SODIUM: 133 mmol/L — AB (ref 134–144)
Total Protein: 7.3 g/dL (ref 6.0–8.5)

## 2018-04-12 LAB — CARBAMAZEPINE LEVEL, TOTAL: CARBAMAZEPINE LVL: 8.9 ug/mL (ref 4.0–12.0)

## 2018-04-12 LAB — LEVETIRACETAM LEVEL: LEVETIRACETAM: 22.1 ug/mL (ref 10.0–40.0)

## 2018-04-17 ENCOUNTER — Other Ambulatory Visit (INDEPENDENT_AMBULATORY_CARE_PROVIDER_SITE_OTHER): Payer: Self-pay

## 2018-04-17 DIAGNOSIS — Z5181 Encounter for therapeutic drug level monitoring: Secondary | ICD-10-CM

## 2018-04-17 DIAGNOSIS — Z0289 Encounter for other administrative examinations: Secondary | ICD-10-CM

## 2018-04-18 ENCOUNTER — Telehealth: Payer: Self-pay

## 2018-04-18 LAB — BASIC METABOLIC PANEL
BUN/Creatinine Ratio: 33 — ABNORMAL HIGH (ref 12–28)
BUN: 19 mg/dL (ref 8–27)
CHLORIDE: 94 mmol/L — AB (ref 96–106)
CO2: 25 mmol/L (ref 20–29)
Calcium: 10.1 mg/dL (ref 8.7–10.3)
Creatinine, Ser: 0.58 mg/dL (ref 0.57–1.00)
GFR calc non Af Amer: 95 mL/min/{1.73_m2} (ref >59)
GFR, EST AFRICAN AMERICAN: 110 mL/min/{1.73_m2} (ref >59)
GLUCOSE: 65 mg/dL (ref 65–99)
Potassium: 4.7 mmol/L (ref 3.5–5.2)
SODIUM: 132 mmol/L — AB (ref 134–144)

## 2018-04-18 LAB — CBC WITH DIFFERENTIAL/PLATELET
BASOS ABS: 0 10*3/uL (ref 0.0–0.2)
BASOS: 0 %
EOS (ABSOLUTE): 0 10*3/uL (ref 0.0–0.4)
Eos: 1 %
Hematocrit: 31.7 % — ABNORMAL LOW (ref 34.0–46.6)
Hemoglobin: 10.8 g/dL — ABNORMAL LOW (ref 11.1–15.9)
IMMATURE GRANS (ABS): 0 10*3/uL (ref 0.0–0.1)
Immature Granulocytes: 0 %
LYMPHS ABS: 0.6 10*3/uL — AB (ref 0.7–3.1)
Lymphs: 22 %
MCH: 31.8 pg (ref 26.6–33.0)
MCHC: 34.1 g/dL (ref 31.5–35.7)
MCV: 93 fL (ref 79–97)
MONOS ABS: 0.4 10*3/uL (ref 0.1–0.9)
Monocytes: 14 %
NEUTROS ABS: 1.8 10*3/uL (ref 1.4–7.0)
Neutrophils: 63 %
PLATELETS: 249 10*3/uL (ref 150–379)
RBC: 3.4 x10E6/uL — ABNORMAL LOW (ref 3.77–5.28)
RDW: 13.5 % (ref 12.3–15.4)
WBC: 2.9 10*3/uL — ABNORMAL LOW (ref 3.4–10.8)

## 2018-04-18 NOTE — Telephone Encounter (Signed)
-----   Message from Huston Foley, MD sent at 04/18/2018  8:33 AM EDT ----- WBC count low but better and can monitor routinely, at least every 6 months, like we have. Sodium level stable, can monitor.  Pls call Arlys John to advise, that we will follow routinely, but would be good if the PCP could check in 3 months.  Janene Harvey

## 2018-04-18 NOTE — Telephone Encounter (Signed)
I called Arlys John, caregiver. I explained that pt's labs showed that her WBC count is low but better, pt's sodium level is stable, and we can monitor her labs routinely, every 6 months at least as we have been doing, but Dr. Frances Furbish also recommends a PCP recheck of labs in 3 months. Arlys John verbalized understanding. Arlys John reports that pt is transitioning to a new group home. I asked him to please have pt update her DPR. Arlys John reports that pt's sister will be more involved in pt's care moving forward. I asked Arlys John to please make sure that pt's DPR reflects that we are allowed to speak with pt's sister, if pt is agreeable to this. Pt would need to sign and date the DPR. This new DPR was placed in the mail last week. Arlys John reports that he will make sure this gets completed and will bring it by the office.

## 2018-04-18 NOTE — Progress Notes (Signed)
WBC count low but better and can monitor routinely, at least every 6 months, like we have. Sodium level stable, can monitor.  Pls call Arlys John to advise, that we will follow routinely, but would be good if the PCP could check in 3 months.  Janene Harvey

## 2018-05-13 ENCOUNTER — Telehealth: Payer: Self-pay | Admitting: Neurology

## 2018-05-13 NOTE — Telephone Encounter (Signed)
Nicole MuttersSybil Bray [phone] called request RX for "regular" wheelchair sent to [facility] (p) [phone].

## 2018-05-14 NOTE — Telephone Encounter (Signed)
I spoke with Dr. Frances FurbishAthar. She recommends that pt obtain an RX for a wheelchair from her PCP, or previous prescribing wheelchair provider.  I called [name], pt's sister and guardian. Pt's sister is HCPOA, per her report. Pt has moved group homes, and is doing well at the new group home. I advised [name] of Dr. Teofilo PodAthar's recommendations. She is agreeable to following up with pt's PCP regarding a wheelchair.

## 2018-05-17 ENCOUNTER — Inpatient Hospital Stay
Admission: RE | Admit: 2018-05-17 | Discharge: 2018-05-17 | Disposition: A | Discharge status: Home / Self Care | Payer: Medicare Other | Source: Ambulatory Visit | Attending: Nurse Practitioner | Admitting: Nurse Practitioner

## 2018-07-01 ENCOUNTER — Ambulatory Visit
Admission: RE | Admit: 2018-07-01 | Discharge: 2018-07-01 | Disposition: A | Discharge status: Home / Self Care | Payer: Medicare Other | Source: Ambulatory Visit | Attending: Nurse Practitioner | Admitting: Nurse Practitioner

## 2018-07-01 DIAGNOSIS — E2839 Other primary ovarian failure: Secondary | ICD-10-CM

## 2018-10-16 ENCOUNTER — Telehealth: Payer: Self-pay | Admitting: *Deleted

## 2018-10-16 ENCOUNTER — Ambulatory Visit: Payer: Medicare Other | Admitting: Adult Health

## 2018-10-16 NOTE — Telephone Encounter (Signed)
Patient was no show for follow up with NP today.  

## 2018-10-17 ENCOUNTER — Encounter: Payer: Self-pay | Admitting: Adult Health

## 2019-07-01 ENCOUNTER — Other Ambulatory Visit: Payer: Self-pay | Admitting: Family Medicine

## 2019-07-01 ENCOUNTER — Other Ambulatory Visit: Payer: Self-pay | Admitting: Nurse Practitioner

## 2019-07-01 DIAGNOSIS — Z1231 Encounter for screening mammogram for malignant neoplasm of breast: Secondary | ICD-10-CM

## 2019-07-28 ENCOUNTER — Ambulatory Visit: Payer: Medicare Other

## 2019-09-10 ENCOUNTER — Ambulatory Visit: Payer: Medicare Other

## 2019-09-24 ENCOUNTER — Ambulatory Visit: Payer: Medicare Other | Admitting: Adult Health

## 2019-11-03 ENCOUNTER — Other Ambulatory Visit: Payer: Self-pay | Admitting: Nurse Practitioner

## 2019-11-03 ENCOUNTER — Other Ambulatory Visit: Payer: Self-pay

## 2019-11-03 ENCOUNTER — Ambulatory Visit
Admission: RE | Admit: 2019-11-03 | Discharge: 2019-11-03 | Disposition: A | Discharge status: Home / Self Care | Payer: Medicare Other | Source: Ambulatory Visit | Attending: Nurse Practitioner | Admitting: Nurse Practitioner

## 2019-11-03 DIAGNOSIS — Z1231 Encounter for screening mammogram for malignant neoplasm of breast: Secondary | ICD-10-CM

## 2019-12-18 ENCOUNTER — Ambulatory Visit: Payer: Medicare Other | Attending: Internal Medicine

## 2019-12-18 DIAGNOSIS — Z20822 Contact with and (suspected) exposure to covid-19: Secondary | ICD-10-CM

## 2019-12-19 LAB — NOVEL CORONAVIRUS, NAA: SARS-CoV-2, NAA: NOT DETECTED

## 2019-12-20 ENCOUNTER — Telehealth: Payer: Self-pay | Admitting: Hematology

## 2019-12-20 NOTE — Telephone Encounter (Signed)
Pt is aware covid 19 test is neg on 12-20-2019 

## 2019-12-22 ENCOUNTER — Telehealth: Payer: Self-pay

## 2019-12-22 NOTE — Telephone Encounter (Signed)
Spoke with Nicole [name] at the group home and offered her a sooner appt today at 1:30 pm. She declined the sooner appt but was appreciative for the call. No other questions or concerns at this time.

## 2019-12-30 ENCOUNTER — Encounter: Payer: Self-pay | Admitting: Adult Health

## 2019-12-30 ENCOUNTER — Ambulatory Visit (INDEPENDENT_AMBULATORY_CARE_PROVIDER_SITE_OTHER): Payer: Medicare Other | Admitting: Adult Health

## 2019-12-30 ENCOUNTER — Other Ambulatory Visit: Payer: Self-pay

## 2019-12-30 VITALS — BP 118/80 | HR 66 | Temp 96.9°F | Ht 67.0 in | Wt 184.3 lb

## 2019-12-30 DIAGNOSIS — R296 Repeated falls: Secondary | ICD-10-CM | POA: Diagnosis not present

## 2019-12-30 DIAGNOSIS — Z5181 Encounter for therapeutic drug level monitoring: Secondary | ICD-10-CM

## 2019-12-30 DIAGNOSIS — R569 Unspecified convulsions: Secondary | ICD-10-CM

## 2019-12-30 NOTE — Patient Instructions (Signed)
Continue  Tegretol and Keppra Blood work today If you have any seizure events please let us know.

## 2019-12-30 NOTE — Progress Notes (Signed)
PATIENT: Nicole [name] DOB: [birth year]  REASON FOR VISIT: follow up HISTORY FROM: patient  HISTORY OF PRESENT ILLNESS: Today 12/30/19:  Nicole [name] is a 69 year old female with a history of seizures.  She returns today for follow-up.  She has not been seen in over a year.  She is here today with her caregiver.  She continues on carbamazepine 300 mg twice a day and Keppra 500 mg twice a day.  The caregiver reports no seizure events.  She lives in a group home.  She is able to complete all ADLs independently.  Patient's sister reports that she has had more falls.  She states that she primarily uses the wheelchair.  Her last fall was Saturday but fortunately she did not suffer any injuries..  She returns today for an evaluation.  HISTORY 04/10/2018(all dictated new, as well as above notes, some dictation done in note pad or Word, outside of chart, may appear as copied): She reports doing all right, no recent seizure activity, recently took a trip with her sister to [place]. Sister lives locally here but also has a place in [place]. Patient had a syncopal spell while at her sister's house, after taking a shower. Thankfully, no major injuries, was unresponsive for a few seconds, no postictal state, no convulsive movements. Of note, she was seen in the emergency room in December 2018 after a fall. She had a head CT without contrast on 11/15/2017 and I reviewed the results: IMPRESSION: 1. No acute intracranial pathology. 2. Ventriculomegaly slightly disproportionate to the degree of cerebral atrophy. This appearance can be seen with normal pressure hydrocephalus. Correlate with clinical exam.  Per sister, since she requires more observation with supervision even overnight, they are looking at different group home were 24/7 supervision is available.  The patient's allergies, current medications, family history, past medical history, past social history, past surgical history and problem list  were reviewed and updated as appropriate.   REVIEW OF SYSTEMS: Out of a complete 14 system review of symptoms, the patient complains only of the following symptoms, and all other reviewed systems are negative.  See HPI  ALLERGIES: Allergies  Allergen Reactions  . Seasonal Ic [Cholestatin]     Runny nose    HOME MEDICATIONS: Outpatient Medications Prior to Visit  Medication Sig Dispense Refill  . azelastine (ASTELIN) 137 MCG/SPRAY nasal spray Place 1 spray into the nose at bedtime. Use in each nostril as directed    . benzonatate (TESSALON) 100 MG capsule Take by mouth daily. Every 6 hours as needed    . busPIRone (BUSPAR) 15 MG tablet Take 15 mg by mouth 2 (two) times daily.      . calcium carbonate (OS-CAL) 600 MG TABS tablet Take by mouth.    . carbamazepine (TEGRETOL XR) 100 MG 12 hr tablet Take 3 tablets (300 mg total) by mouth 2 (two) times daily. Reducing due to low sodium levels recently. 180 tablet 5  . Cholecalciferol (VITAMIN D3) 2000 units TABS Take by mouth.    . diclofenac (VOLTAREN) 75 MG EC tablet     . gabapentin (NEURONTIN) 300 MG capsule Take 1 capsule (300 mg total) by mouth as directed. Taken tid on weekends, taken qid on weekdays. 115 capsule 1  . levETIRAcetam (KEPPRA) 500 MG tablet Take 1 tablet (500 mg total) by mouth 2 (two) times daily. 60 tablet 5  . losartan (COZAAR) 100 MG tablet Take 100 mg by mouth daily.    . mometasone (ASMANEX 60 METERED  DOSES) 220 MCG/INH inhaler Inhale 2 puffs into the lungs daily. 1 Inhaler 12  . Multiple Vitamins-Minerals (CERTAVITE/ANTIOXIDANTS) TABS Take by mouth. Advance , daily    . pantoprazole (PROTONIX) 40 MG tablet     . Selenium 200 MCG TBCR Take 1 tablet by mouth daily.    . Wheat Dextrin (BENEFIBER) CHEW Chew 1 tablet by mouth 2 (two) times daily.       No facility-administered medications prior to visit.    PAST MEDICAL HISTORY: Past Medical History:  Diagnosis Date  . Abnormal WBC count    low chronic WBC  (2-3K)  . Allergic rhinitis   . Alopecia   . Discoid lupus   . Hyperlipidemia   . Hypertension   . Menopause   . Moderate mental retardation   . Obesity   . Osteoporosis   . Pulmonary nodules   . Seizures (HCC)    focal-being by Dr. Sandria Manly (should not take anti-histamines)    PAST SURGICAL HISTORY: Past Surgical History:  Procedure Laterality Date  . VESICOVAGINAL FISTULA CLOSURE W/ TAH      FAMILY HISTORY: Family History  Problem Relation Age of Onset  . Breast cancer Mother     SOCIAL HISTORY: Social History   Socioeconomic History  . Marital status: Single    Spouse name: Not on file  . Number of children: Not on file  . Years of education: Not on file  . Highest education level: Not on file  Occupational History  . 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 Smoking status: Never Smoker  . Smokeless tobacco: Never Used  Substance and Sexual Activity  . Alcohol use: No  . Drug use: No  . Sexual activity: Never  Other Topics Concern  . Not on file  Social History Narrative   [facility] [phone]   Social Determinants of Health   Financial Resource Strain:   . Difficulty of Paying Living Expenses: Not on file  Food Insecurity:   . Worried About Programme researcher, broadcasting/film/video in the Last Year: Not on file  . Ran Out of Food in the Last Year: Not on file  Transportation Needs:   . Lack of Transportation (Medical): Not on file  . Lack of Transportation (Non-Medical): Not on file  Physical Activity:   . Days of Exercise per Week: Not on file  . Minutes of Exercise per Session: Not on file  Stress:   . Feeling of Stress : Not on file  Social Connections:   . Frequency of Communication with Friends and Family: Not on file  . Frequency of Social Gatherings with Friends and Family: Not on file  . Attends Religious Services: Not on file  . Active Member of Clubs or Organizations:  Not on file  . Attends Banker Meetings: Not on file  . Marital Status: Not on file  Intimate Partner Violence:   . Fear of Current or Ex-Partner: Not on file  . Emotionally Abused: Not on file  . Physically Abused: Not on file  . Sexually Abused: Not on file      PHYSICAL EXAM  Vitals:   12/30/19 1118  Weight: 184 lb 4.8 oz (83.6 kg)   Body mass index is 28.87 kg/m.  Generalized: Well developed, in no acute distress   Neurological examination  Mentation: Alert oriented to time, place, history taking. Follows all commands speech and language  fluent Cranial nerve II-XII: Pupils were equal round reactive to light. Extraocular movements were full, visual field were full on confrontational test. Facial sensation and strength were normal. Uvula tongue midline. Head turning and shoulder shrug  were normal and symmetric. Motor: The motor testing reveals 5 over 5 strength of all 4 extremities. Good symmetric motor tone is noted throughout.  Sensory: Sensory testing is intact to soft touch on all 4 extremities. No evidence of extinction is noted.  Coordination: Cerebellar testing reveals good finger-nose-finger and heel-to-shin bilaterally.  Gait and station: Gait is normal. Tandem gait is normal. Romberg is negative. No drift is seen.  Reflexes: Deep tendon reflexes are symmetric and normal bilaterally.   DIAGNOSTIC DATA (LABS, IMAGING, TESTING) - I reviewed patient records, labs, notes, testing and imaging myself where available.  Lab Results  Component Value Date   WBC 2.9 (L) 04/17/2018   HGB 10.8 (L) 04/17/2018   HCT 31.7 (L) 04/17/2018   MCV 93 04/17/2018   PLT 249 04/17/2018      Component Value Date/Time   NA 132 (L) 04/17/2018 1049   K 4.7 04/17/2018 1049   CL 94 (L) 04/17/2018 1049   CO2 25 04/17/2018 1049   GLUCOSE 65 04/17/2018 1049   GLUCOSE 118 (H) 07/06/2013 1015   BUN 19 04/17/2018 1049   CREATININE 0.58 04/17/2018 1049   CALCIUM 10.1  04/17/2018 1049   PROT 7.3 04/10/2018 0916   ALBUMIN 4.4 04/10/2018 0916   AST 14 04/10/2018 0916   ALT 10 04/10/2018 0916   ALKPHOS 115 04/10/2018 0916   BILITOT 0.3 04/10/2018 0916   GFRNONAA 95 04/17/2018 1049   GFRAA 110 04/17/2018 1049   No results found for: CHOL, HDL, LDLCALC, LDLDIRECT, TRIG, CHOLHDL No results found for: MLYY5K No results found for: VITAMINB12 Lab Results  Component Value Date   TSH 3.020 08/13/2013      ASSESSMENT AND PLAN 69 y.o. year old female  has a past medical history of Abnormal WBC count, Allergic rhinitis, Alopecia, Discoid lupus, Hyperlipidemia, Hypertension, Menopause, Moderate mental retardation, Obesity, Osteoporosis, Pulmonary nodules, and Seizures (HCC). here with:  1.  Seizures  -Continue on Tegretol 300 mg twice a day -Continue on Keppra 500 mg twice a day -Blood work today, CBC, CMP and carbamazepine level -Advised if she has any seizure events he should let us know. -Follow-up in 1 year or sooner if needed  2.  Frequent falls  -Blood work today to ensure no electrolyte or drug level abnormality -Patient has an appointment with PCP on Friday -Physical therapy may be needed  Advised if symptoms worsen or they develop new symptoms she should let us know.  Follow-up in 1 year or sooner if needed   I spent 15 minutes with the patient. 50% of this time was spent reviewing plan of care   Butch Penny, MSN, NP-C 12/30/2019, 11:19 AM [facility] [address] [place], [place] [address] [phone]

## 2019-12-31 LAB — CBC WITH DIFFERENTIAL/PLATELET
Basophils Absolute: 0 10*3/uL (ref 0.0–0.2)
Basos: 0 %
EOS (ABSOLUTE): 0 10*3/uL (ref 0.0–0.4)
Eos: 1 %
Hematocrit: 35.3 % (ref 34.0–46.6)
Hemoglobin: 12.2 g/dL (ref 11.1–15.9)
Immature Grans (Abs): 0 10*3/uL (ref 0.0–0.1)
Immature Granulocytes: 0 %
Lymphocytes Absolute: 0.6 10*3/uL — ABNORMAL LOW (ref 0.7–3.1)
Lymphs: 17 %
MCH: 33.3 pg — ABNORMAL HIGH (ref 26.6–33.0)
MCHC: 34.6 g/dL (ref 31.5–35.7)
MCV: 96 fL (ref 79–97)
Monocytes Absolute: 0.4 10*3/uL (ref 0.1–0.9)
Monocytes: 11 %
Neutrophils Absolute: 2.4 10*3/uL (ref 1.4–7.0)
Neutrophils: 71 %
Platelets: 228 10*3/uL (ref 150–450)
RBC: 3.66 x10E6/uL — ABNORMAL LOW (ref 3.77–5.28)
RDW: 12.3 % (ref 11.7–15.4)
WBC: 3.5 10*3/uL (ref 3.4–10.8)

## 2019-12-31 LAB — COMPREHENSIVE METABOLIC PANEL
ALT: 11 IU/L (ref 0–32)
AST: 16 IU/L (ref 0–40)
Albumin/Globulin Ratio: 1.5 (ref 1.2–2.2)
Albumin: 4.2 g/dL (ref 3.8–4.8)
Alkaline Phosphatase: 121 IU/L — ABNORMAL HIGH (ref 39–117)
BUN/Creatinine Ratio: 22 (ref 12–28)
BUN: 17 mg/dL (ref 8–27)
Bilirubin Total: 0.2 mg/dL (ref 0.0–1.2)
CO2: 26 mmol/L (ref 20–29)
Calcium: 10 mg/dL (ref 8.7–10.3)
Chloride: 98 mmol/L (ref 96–106)
Creatinine, Ser: 0.78 mg/dL (ref 0.57–1.00)
GFR calc Af Amer: 89 mL/min/{1.73_m2} (ref >59)
GFR calc non Af Amer: 77 mL/min/{1.73_m2} (ref >59)
Globulin, Total: 2.8 g/dL (ref 1.5–4.5)
Glucose: 72 mg/dL (ref 65–99)
Potassium: 4.7 mmol/L (ref 3.5–5.2)
Sodium: 136 mmol/L (ref 134–144)
Total Protein: 7 g/dL (ref 6.0–8.5)

## 2019-12-31 LAB — CARBAMAZEPINE LEVEL, TOTAL: Carbamazepine (Tegretol), S: 9.1 ug/mL (ref 4.0–12.0)

## 2020-01-05 ENCOUNTER — Telehealth: Payer: Self-pay

## 2020-01-05 NOTE — Telephone Encounter (Signed)
Please relay. Called to go over recent lab results per NP Butch Penny. No answer left a VM.   "Labs results are unremarkable. Please call patient with results."- Per NP MM

## 2020-01-06 NOTE — Telephone Encounter (Signed)
Sending letter

## 2020-01-06 NOTE — Telephone Encounter (Signed)
Please see previous.  Attempted to call again to go over lab results. No answer.   Please relay results.

## 2020-02-24 ENCOUNTER — Telehealth: Payer: Self-pay

## 2020-02-24 NOTE — Telephone Encounter (Signed)
Reached out to see if the pt would like to take the 9:30 slot for tomorrow because of a cancellation and the pt was wait listed for that time.   Spoke with April (on [place]) states that she had seen the provider and January and is fine. No concerns.

## 2020-05-10 ENCOUNTER — Telehealth: Payer: Self-pay | Admitting: *Deleted

## 2020-05-10 NOTE — Telephone Encounter (Signed)
April @ pt's facility called and scheduled appt with Aundra Millet NP for follow-up. I offered appts this week. She accepted Wed 6/23 @ 1:30 pm arrival 30 minutes prior. She denied any new issues, only stated there's a heavy increase in falls.

## 2020-05-17 ENCOUNTER — Encounter (HOSPITAL_COMMUNITY): Payer: Self-pay | Admitting: Emergency Medicine

## 2020-05-17 ENCOUNTER — Inpatient Hospital Stay (HOSPITAL_COMMUNITY)
Admit: 2020-05-17 | Discharge: 2020-05-27 | DRG: 682 | Disposition: A | Discharge status: Home Health | Payer: Medicare Other | Attending: Family Medicine | Admitting: Family Medicine

## 2020-05-17 DIAGNOSIS — R4182 Altered mental status, unspecified: Secondary | ICD-10-CM | POA: Insufficient documentation

## 2020-05-17 DIAGNOSIS — Z66 Do not resuscitate: Secondary | ICD-10-CM | POA: Diagnosis present

## 2020-05-17 DIAGNOSIS — K56609 Unspecified intestinal obstruction, unspecified as to partial versus complete obstruction: Secondary | ICD-10-CM

## 2020-05-17 DIAGNOSIS — L930 Discoid lupus erythematosus: Secondary | ICD-10-CM | POA: Diagnosis present

## 2020-05-17 DIAGNOSIS — R197 Diarrhea, unspecified: Secondary | ICD-10-CM | POA: Diagnosis present

## 2020-05-17 DIAGNOSIS — Z20822 Contact with and (suspected) exposure to covid-19: Secondary | ICD-10-CM | POA: Diagnosis present

## 2020-05-17 DIAGNOSIS — F71 Moderate intellectual disabilities: Secondary | ICD-10-CM | POA: Diagnosis present

## 2020-05-17 DIAGNOSIS — G9341 Metabolic encephalopathy: Secondary | ICD-10-CM | POA: Diagnosis present

## 2020-05-17 DIAGNOSIS — N179 Acute kidney failure, unspecified: Principal | ICD-10-CM | POA: Diagnosis present

## 2020-05-17 DIAGNOSIS — Z993 Dependence on wheelchair: Secondary | ICD-10-CM

## 2020-05-17 DIAGNOSIS — J309 Allergic rhinitis, unspecified: Secondary | ICD-10-CM | POA: Diagnosis present

## 2020-05-17 DIAGNOSIS — R55 Syncope and collapse: Secondary | ICD-10-CM | POA: Diagnosis not present

## 2020-05-17 DIAGNOSIS — G40909 Epilepsy, unspecified, not intractable, without status epilepticus: Secondary | ICD-10-CM | POA: Diagnosis present

## 2020-05-17 DIAGNOSIS — I959 Hypotension, unspecified: Secondary | ICD-10-CM | POA: Diagnosis present

## 2020-05-17 DIAGNOSIS — E876 Hypokalemia: Secondary | ICD-10-CM | POA: Diagnosis not present

## 2020-05-17 DIAGNOSIS — Z4659 Encounter for fitting and adjustment of other gastrointestinal appliance and device: Secondary | ICD-10-CM

## 2020-05-17 DIAGNOSIS — I10 Essential (primary) hypertension: Secondary | ICD-10-CM | POA: Diagnosis present

## 2020-05-17 DIAGNOSIS — E860 Dehydration: Secondary | ICD-10-CM | POA: Diagnosis present

## 2020-05-17 DIAGNOSIS — I69354 Hemiplegia and hemiparesis following cerebral infarction affecting left non-dominant side: Secondary | ICD-10-CM

## 2020-05-17 DIAGNOSIS — E875 Hyperkalemia: Secondary | ICD-10-CM | POA: Diagnosis not present

## 2020-05-17 DIAGNOSIS — I5030 Unspecified diastolic (congestive) heart failure: Secondary | ICD-10-CM | POA: Diagnosis present

## 2020-05-17 DIAGNOSIS — I110 Hypertensive heart disease with heart failure: Secondary | ICD-10-CM | POA: Diagnosis present

## 2020-05-17 DIAGNOSIS — F552 Abuse of laxatives: Secondary | ICD-10-CM | POA: Diagnosis present

## 2020-05-17 DIAGNOSIS — Z803 Family history of malignant neoplasm of breast: Secondary | ICD-10-CM

## 2020-05-17 DIAGNOSIS — E785 Hyperlipidemia, unspecified: Secondary | ICD-10-CM | POA: Diagnosis present

## 2020-05-17 DIAGNOSIS — M810 Age-related osteoporosis without current pathological fracture: Secondary | ICD-10-CM | POA: Diagnosis present

## 2020-05-17 DIAGNOSIS — R569 Unspecified convulsions: Secondary | ICD-10-CM

## 2020-05-17 DIAGNOSIS — R112 Nausea with vomiting, unspecified: Secondary | ICD-10-CM

## 2020-05-17 DIAGNOSIS — K567 Ileus, unspecified: Secondary | ICD-10-CM | POA: Diagnosis present

## 2020-05-17 DIAGNOSIS — G931 Anoxic brain damage, not elsewhere classified: Secondary | ICD-10-CM | POA: Diagnosis present

## 2020-05-17 DIAGNOSIS — R0602 Shortness of breath: Secondary | ICD-10-CM

## 2020-05-17 DIAGNOSIS — Z79899 Other long term (current) drug therapy: Secondary | ICD-10-CM

## 2020-05-17 LAB — COMPREHENSIVE METABOLIC PANEL
ALT: 19 U/L (ref 0–44)
AST: 29 U/L (ref 15–41)
Albumin: 3.5 g/dL (ref 3.5–5.0)
Alkaline Phosphatase: 88 U/L (ref 38–126)
Anion gap: 12 (ref 5–15)
BUN: 28 mg/dL — ABNORMAL HIGH (ref 8–23)
CO2: 25 mmol/L (ref 22–32)
Calcium: 10.6 mg/dL — ABNORMAL HIGH (ref 8.9–10.3)
Chloride: 100 mmol/L (ref 98–111)
Creatinine, Ser: 1.19 mg/dL — ABNORMAL HIGH (ref 0.44–1.00)
GFR calc Af Amer: 54 mL/min — ABNORMAL LOW (ref >60)
GFR calc non Af Amer: 46 mL/min — ABNORMAL LOW (ref >60)
Glucose, Bld: 118 mg/dL — ABNORMAL HIGH (ref 70–99)
Potassium: 4.9 mmol/L (ref 3.5–5.1)
Sodium: 137 mmol/L (ref 135–145)
Total Bilirubin: 1 mg/dL (ref 0.3–1.2)
Total Protein: 6.8 g/dL (ref 6.5–8.1)

## 2020-05-17 LAB — CBC WITH DIFFERENTIAL/PLATELET
Abs Immature Granulocytes: 0.03 10*3/uL (ref 0.00–0.07)
Basophils Absolute: 0 10*3/uL (ref 0.0–0.1)
Basophils Relative: 0 %
Eosinophils Absolute: 0 10*3/uL (ref 0.0–0.5)
Eosinophils Relative: 0 %
HCT: 45.5 % (ref 36.0–46.0)
Hemoglobin: 15 g/dL (ref 12.0–15.0)
Immature Granulocytes: 0 %
Lymphocytes Relative: 5 %
Lymphs Abs: 0.4 10*3/uL — ABNORMAL LOW (ref 0.7–4.0)
MCH: 33 pg (ref 26.0–34.0)
MCHC: 33 g/dL (ref 30.0–36.0)
MCV: 100 fL (ref 80.0–100.0)
Monocytes Absolute: 0.6 10*3/uL (ref 0.1–1.0)
Monocytes Relative: 7 %
Neutro Abs: 7.6 10*3/uL (ref 1.7–7.7)
Neutrophils Relative %: 88 %
Platelets: 228 10*3/uL (ref 150–400)
RBC: 4.55 MIL/uL (ref 3.87–5.11)
RDW: 12.9 % (ref 11.5–15.5)
WBC: 8.7 10*3/uL (ref 4.0–10.5)
nRBC: 0 % (ref 0.0–0.2)

## 2020-05-17 LAB — C DIFFICILE QUICK SCREEN W PCR REFLEX
C Diff antigen: NEGATIVE
C Diff interpretation: NOT DETECTED
C Diff toxin: NEGATIVE

## 2020-05-17 LAB — CARBAMAZEPINE LEVEL, TOTAL: Carbamazepine Lvl: 10.1 ug/mL (ref 4.0–12.0)

## 2020-05-17 LAB — SARS CORONAVIRUS 2 BY RT PCR (HOSPITAL ORDER, PERFORMED IN ~~LOC~~ HOSPITAL LAB): SARS Coronavirus 2: NEGATIVE

## 2020-05-17 LAB — MAGNESIUM: Magnesium: 2.8 mg/dL — ABNORMAL HIGH (ref 1.7–2.4)

## 2020-05-17 MED ORDER — LACTATED RINGERS IV SOLN: INTRAVENOUS | Status: DC

## 2020-05-17 MED ORDER — SODIUM CHLORIDE 0.9 % IV BOLUS
1000.0000 mL | Freq: Once | INTRAVENOUS | Status: AC
  Administered 2020-05-17: 1000 mL via INTRAVENOUS

## 2020-05-17 MED ORDER — ZINC OXIDE 40 % EX OINT
TOPICAL_OINTMENT | Freq: Two times a day (BID) | CUTANEOUS | Status: DC | PRN
  Filled 2020-05-17: qty 57

## 2020-05-17 MED ORDER — LEVETIRACETAM 500 MG PO TABS
500.0000 mg | ORAL_TABLET | Freq: Two times a day (BID) | ORAL | Status: DC
  Administered 2020-05-17 – 2020-05-18 (×3): 500 mg via ORAL
  Filled 2020-05-17 (×3): qty 1

## 2020-05-17 MED ORDER — ATORVASTATIN CALCIUM 10 MG PO TABS
10.0000 mg | ORAL_TABLET | Freq: Every day | ORAL | Status: DC
  Administered 2020-05-18: 10 mg via ORAL
  Filled 2020-05-17: qty 1

## 2020-05-17 MED ORDER — BUDESONIDE 0.25 MG/2ML IN SUSP
0.2500 mg | Freq: Two times a day (BID) | RESPIRATORY_TRACT | Status: DC
  Administered 2020-05-17 – 2020-05-27 (×8): 0.25 mg via RESPIRATORY_TRACT
  Filled 2020-05-17 (×10): qty 2

## 2020-05-17 MED ORDER — AZELASTINE HCL 0.1 % NA SOLN: 1.0000 | Freq: Every day | NASAL | Status: DC

## 2020-05-17 MED ORDER — BUSPIRONE HCL 5 MG PO TABS
15.0000 mg | ORAL_TABLET | Freq: Two times a day (BID) | ORAL | Status: DC
  Administered 2020-05-17 – 2020-05-18 (×3): 15 mg via ORAL
  Filled 2020-05-17 (×3): qty 1

## 2020-05-17 MED ORDER — PANTOPRAZOLE SODIUM 40 MG PO TBEC
40.0000 mg | DELAYED_RELEASE_TABLET | Freq: Every day | ORAL | Status: DC
  Administered 2020-05-17 – 2020-05-18 (×2): 40 mg via ORAL
  Filled 2020-05-17 (×2): qty 1

## 2020-05-17 MED ORDER — ONDANSETRON HCL 4 MG PO TABS: 4.0000 mg | ORAL_TABLET | Freq: Four times a day (QID) | ORAL | Status: DC | PRN

## 2020-05-17 MED ORDER — ACETAMINOPHEN 650 MG RE SUPP: 650.0000 mg | Freq: Four times a day (QID) | RECTAL | Status: DC | PRN

## 2020-05-17 MED ORDER — BENZONATATE 100 MG PO CAPS: 100.0000 mg | ORAL_CAPSULE | Freq: Three times a day (TID) | ORAL | Status: DC | PRN

## 2020-05-17 MED ORDER — BUDESONIDE 180 MCG/ACT IN AEPB: 1.0000 | INHALATION_SPRAY | Freq: Two times a day (BID) | RESPIRATORY_TRACT | Status: DC

## 2020-05-17 MED ORDER — SODIUM CHLORIDE 0.9% FLUSH
3.0000 mL | Freq: Two times a day (BID) | INTRAVENOUS | Status: DC
  Administered 2020-05-17 – 2020-05-26 (×12): 3 mL via INTRAVENOUS

## 2020-05-17 MED ORDER — BARRIER CREAM NON-SPECIFIED: 1.0000 "application " | TOPICAL_CREAM | Freq: Two times a day (BID) | TOPICAL | Status: DC | PRN

## 2020-05-17 MED ORDER — ONDANSETRON HCL 4 MG/2ML IJ SOLN
4.0000 mg | Freq: Four times a day (QID) | INTRAMUSCULAR | Status: DC | PRN
  Administered 2020-05-19 (×3): 4 mg via INTRAVENOUS
  Filled 2020-05-17 (×3): qty 2

## 2020-05-17 MED ORDER — ACETAMINOPHEN 325 MG PO TABS
650.0000 mg | ORAL_TABLET | Freq: Four times a day (QID) | ORAL | Status: DC | PRN
  Administered 2020-05-17 – 2020-05-26 (×3): 650 mg via ORAL
  Filled 2020-05-17 (×3): qty 2

## 2020-05-17 MED ORDER — CARBAMAZEPINE ER 200 MG PO TB12
300.0000 mg | ORAL_TABLET | Freq: Two times a day (BID) | ORAL | Status: DC
  Administered 2020-05-17 – 2020-05-27 (×16): 300 mg via ORAL
  Filled 2020-05-17 (×21): qty 1

## 2020-05-17 MED ORDER — GABAPENTIN 300 MG PO CAPS
300.0000 mg | ORAL_CAPSULE | Freq: Four times a day (QID) | ORAL | Status: DC
  Administered 2020-05-17 – 2020-05-18 (×6): 300 mg via ORAL
  Filled 2020-05-17 (×6): qty 1

## 2020-05-17 NOTE — ED Notes (Signed)
4th floor states pager issues, will approve bed soon.

## 2020-05-17 NOTE — ED Provider Notes (Signed)
[facility] DEPT Provider Note   CSN: [id number] Arrival date & time: 05/17/20  1244     History Chief Complaint  Patient presents with  . Diarrhea  . Hypotension    Nicole [name] is a 69 y.o. 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  History obtained directly from patient.  States she was sitting on her wheelchair" felt a seizure coming".  She woke up and had a mild headache and felt really tired which states is typical for her after her seizures.  She then had sudden onset of moderate to severe diffuse abdominal pain followed with 1 episode of large volume diarrhea.  Patient states she has been feeling well and at her baseline the last few days.  Currently just feels tired and worn out from her seizure but denies any other symptoms.  Denies any recent fevers, nausea, vomiting, abdominal pains or changes in her stools other than the episode today.  Denies any dysuria.  States she has been taking all her medicines as prescribed.  Denies any head trauma.  Chronic left-sided weakness, unchanged.  Denies neck pain or stiffness.  Denies changes in her vision, new one-sided weakness or numbness.  No recent antibiotics.  Unknown about sick contacts at the group home.  HPI     Past Medical History:  Diagnosis Date  . 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 Allergic rhinitis   . Alopecia   . Discoid lupus   . Hyperlipidemia   . Hypertension   . Menopause   . Moderate mental retardation   . Obesity   . Osteoporosis   . Pulmonary nodules   . Seizures ([facility])    focal-being by Dr. Erling Cruz (should not take anti-histamines)    Patient Active Problem List   Diagnosis Date Noted  . Altered mental status 05/17/2020  . Oral thrush 02/28/2012  . ALOPECIA 01/04/2010  .  ALLERGIC RHINITIS 07/17/2008  . PULMONARY NODULE 07/17/2008  . ENLARGEMENT OF LYMPH NODES 07/17/2008  . Cough 07/17/2008    Past Surgical History:  Procedure Laterality Date  . VESICOVAGINAL FISTULA CLOSURE W/ TAH       OB History   No obstetric history on file.     Family History  Problem Relation Age of Onset  . Breast cancer Mother     Social History   Tobacco Use  . Smoking status: Never Smoker  . Smokeless tobacco: Never Used  Substance Use Topics  . Alcohol use: No  . Drug use: No    Home Medications Prior to Admission medications   Medication Sig Start Date End Date Taking? Authorizing Provider  atorvastatin (LIPITOR) 10 MG tablet Take 10 mg by mouth daily. 12/08/19   [provider]  azelastine (ASTELIN) 137 MCG/SPRAY nasal spray Place 1 spray into the nose at bedtime. Use in each nostril as directed    [provider]  benzonatate (TESSALON) 100 MG capsule Take by mouth daily. Every 6 hours as needed    [provider]  budesonide (PULMICORT FLEXHALER) 180 MCG/ACT inhaler Inhale 1 puff into the lungs 2 (two) times daily. 09/10/19   [provider]  busPIRone (BUSPAR) 15 MG tablet Take 15 mg by mouth 2 (two) times daily.      [provider]  calcium carbonate (  OS-CAL) 600 MG TABS tablet Take by mouth.    [provider]  carbamazepine (TEGRETOL XR) 100 MG 12 hr tablet Take 3 tablets (300 mg total) by mouth 2 (two) times daily. Reducing due to low sodium levels recently. 04/10/18   Huston Foley, MD  Cholecalciferol (VITAMIN D3) 2000 units TABS Take by mouth.    [provider]  diclofenac (VOLTAREN) 75 MG EC tablet  07/28/16   [provider]  gabapentin (NEURONTIN) 300 MG capsule Take 1 capsule (300 mg total) by mouth as directed. Taken tid on weekends, taken qid on weekdays. 09/30/13   Kalman Shan, MD  levETIRAcetam (KEPPRA) 500 MG tablet Take 1 tablet (500 mg total) by mouth 2 (two) times  daily. 04/10/18   Huston Foley, MD  losartan (COZAAR) 100 MG tablet Take 100 mg by mouth daily. 03/28/18   [provider]  mometasone (ASMANEX 60 METERED DOSES) 220 MCG/INH inhaler Inhale 2 puffs into the lungs daily. Patient not taking: Reported on 12/30/2019 09/02/12   Kalman Shan, MD  Multiple Vitamins-Minerals (CERTAVITE/ANTIOXIDANTS) TABS Take by mouth. Advance , daily    [provider]  pantoprazole (PROTONIX) 40 MG tablet  08/04/13   [provider]  polyethylene glycol powder (GLYCOLAX/MIRALAX) 17 GM/SCOOP powder Take by mouth daily. Take 1 capful at 4 pm    [provider]  Selenium 200 MCG TBCR Take 1 tablet by mouth daily.    [provider]  Wheat Dextrin (BENEFIBER) CHEW Chew 1 tablet by mouth 2 (two) times daily.      [provider]    Allergies    Seasonal ic [cholestatin]  Review of Systems   Review of Systems  Constitutional: Positive for fatigue.  Gastrointestinal: Positive for abdominal pain (resolved) and diarrhea.  Neurological: Positive for seizures and headaches.  All other systems reviewed and are negative.   Physical Exam Updated Vital Signs BP 99/75 (BP Location: Right Arm)   Pulse 94   Temp 98 F (36.7 C) (Oral)   Resp 14   SpO2 98%   Physical Exam Vitals and nursing note reviewed.  Constitutional:      Appearance: She is well-developed.     Comments: Non toxic in NAD  HENT:     Head: Normocephalic and atraumatic.     Nose: Nose normal.  Eyes:     Conjunctiva/sclera: Conjunctivae normal.  Cardiovascular:     Rate and Rhythm: Normal rate and regular rhythm.  Pulmonary:     Effort: Pulmonary effort is normal.     Breath sounds: Normal breath sounds.  Abdominal:     General: Bowel sounds are normal.     Palpations: Abdomen is soft.     Tenderness: There is no abdominal tenderness.     Comments: No G/R/R. No suprapubic or CVA tenderness. Negative Murphy's and McBurney's. Active BS to lower  quadrants.   Musculoskeletal:        General: Normal range of motion.     Cervical back: Normal range of motion.  Skin:    General: Skin is warm and dry.     Capillary Refill: Capillary refill takes less than 2 seconds.  Neurological:     Mental Status: She is alert.     Motor: Weakness (left sided, chronic unchanged) present.     Comments: Left sided upper/lower extremity weakness compared to right, chronic unchanged.  Speech slightly dysarthric and slow.    Mental Status: Patient is awake, alert, oriented to person, place, year, and  situation.  Patient is able to give a clear and coherent history.   Cranial Nerves: I and II not tested, PERRL bilaterally.  III, IV, VI EOMs intact without ptosis or diplopia  V sensation to light touch intact in all 3 divisions of trigeminal nerve bilaterally  VII facial movements symmetric bilaterally VIII hearing intact to voice/conversation  IX, X no uvula deviation, symmetric rise of soft palate/uvula XI 5/5 SCM and trapezius strength bilaterally  XII tongue protrusion midline, symmetric L/R movements  Motor: Left sided weakness in upper/lower extremities, normal right sided strength Sensation to light touch intact in face, upper/lower extremities. Unable to test pronator and leg drop due to chronic weakness bilaterally, patient cannot hold extremities up against gravity L worse than R  Cerebellar: Unable to test ataxia with finger to nose, gait  Psychiatric:        Behavior: Behavior normal.     ED Results / Procedures / Treatments   Labs (all labs ordered are listed, but only abnormal results are displayed) Labs Reviewed  CBC WITH DIFFERENTIAL/PLATELET - Abnormal; Notable for the following components:      Result Value   Lymphs Abs 0.4 (*)    All other components within normal limits  MAGNESIUM - Abnormal; Notable for the following components:   Magnesium 2.8 (*)    All other components within normal limits  COMPREHENSIVE  METABOLIC PANEL - Abnormal; Notable for the following components:   Glucose, Bld 118 (*)    BUN 28 (*)    Creatinine, Ser 1.19 (*)    Calcium 10.6 (*)    GFR calc non Af Amer 46 (*)    GFR calc Af Amer 54 (*)    All other components within normal limits  GASTROINTESTINAL PANEL BY PCR, STOOL (REPLACES STOOL CULTURE)  C DIFFICILE QUICK SCREEN W PCR REFLEX  CARBAMAZEPINE LEVEL, TOTAL    EKG EKG Interpretation  Date/Time:  Monday May 17 2020 13:27:33 EDT Ventricular Rate:  82 PR Interval:    QRS Duration: 100 QT Interval:  374 QTC Calculation: 437 R Axis:   31 Text Interpretation: Sinus rhythm Right atrial enlargement Abnormal R-wave progression, early transition No old tracing to compare Confirmed by Linwood Dibbles [phone]) on 05/17/2020 2:23:31 PM   Radiology No results found.  Procedures Procedures (including critical care time)  Medications Ordered in ED Medications  sodium chloride 0.9 % bolus 1,000 mL (0 mLs Intravenous Stopped 05/17/20 1522)    ED Course  I have reviewed the triage vital signs and the nursing notes.  Pertinent labs & imaging results that were available during my care of the patient were reviewed by me and considered in my medical decision making (see chart for details).  Clinical Course as of May 17 1746  Mon May 17, 2020  1512 Creatinine(!): 1.19 [CG]  1542 GFR, Est Non African American(!): 46 [CG]  1542 Magnesium(!): 2.8 [CG]  1542 Joe from program [phone] Group home [facility] supervised living    [CG]  1550 I called patient's caregiver.  States patient was noted to be sitting in her chair this morning and she fell asleep twice and it.  Staff noticed her asleep in her chair.  Patient woke up and told staff she had a seizure but caregiver states nobody witnessed any convulsions or seizure-like activity and this was only per patient report.  Patient went to her scheduled program and staff at the program called 911 because patient seemed "lethargic" and  had an episode of diarrhea.  Caregiver states patient woke up this morning at her baseline and has been well the last few days.   [CG]  [id number] Per RN patient has had 20 + Bms. I re-evaluated patient. She reports being tired. Sister states she seems more lethargy, sleepy, usually patient does not "stop talking". Patient denies CP, SOB, abdominal pain, nausea, vomiting. Ongoing diarrhea here, incontinent, new for patient.   [CG]    Clinical Course User Index [CG] [name] Handy, PA-C   MDM Rules/Calculators/A&P                           This patient complains of possible seizure vs syncope and sudden onset now resolved abdominal pain with diarrhea.  Hypotensive en route by EMS. Received 500 cc IVF PTA.  I obtain additional history from patient's caregiver, above.    I talked to patient's sister at bedside who states when she saw patient she looked like she was was not responding to taps on her face and her fingertips were cold and blue.  No convulsions. No postictal state, incontinence but more lethargic. Patient thinks she had a seizure.  History is very unclear.  Previous medical records available, nursing notes reviewed to obtain more history and assist with MDM  Chief complain involves an extensive number of treatment options and is a complaint that carries with it a high risk of complications and morbidity and mortality.    The differential diagnosis includes breakthrough seizure from subtherapeutic antiepileptic or electrolyte abnormality vs syncope from hypotension. Favoring syncope. Sister states patient did not have any incontinence, vomiting that she usually has.  Staff at group home noticed patient slouched over and dropped her head twice while sitting down. Patient has no severe headache, neck pain or stiffness, new neuro deficits. Doubt meningitis, intracranial mass, stroke. Per chart review it appears patient has been given laxatives in the last 24 hours.  She has continued to  have large volume DMs in ER.   Per EMS patient was noted to be hypotensive in the 60s however on arrival her SBP is 102 with MAP greater than 65. She is slightly lethargic than baseline per sister. No tachycardia. I ordered IV fluids, continuous cardiac monitoring, continuous pulse oximeter. On seizure precautions.  I ordered laboratory studies including screening labs, magnesium, carbamazepine level, GI panel. No history of C. difficile. No recent antibiotics.  I ordered imaging studies including EKG.  1630: I ordered, reviewed and personally visualized and interpreted the above labs and/or imaging studies.  ER work up reveals minimally elevated magnesium, glucose, creatinine. GI panel pending. Carbamazepine level pending.  Patient has been reevaluated and she has no complaints.  However patient's sister states she is more lethargic. Has had ongoing diarrhea 20+ times per RN here. Concern for recurrent syncope x 2 with initial hypotension in 60s with latest  SBP 91>99 after 1.5 L IVF. Will request admission for syncope likely from orthostatic vs vasovagal in setting or large volume diarrhea with persistent soft SBPs.  Not low risk group per [facility] syncope rule.   Discussed with Dr Jarvis Newcomer who will come see patient in ER.    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

## 2020-05-17 NOTE — ED Triage Notes (Signed)
Patient here via EMS from group home reporting diarrhea and hypotension. Patient given multiple laxatives yesterday and has been having diarrhea since. 60s/40s per EMS. fluid given.

## 2020-05-17 NOTE — H&P (Signed)
History and Physical   [name] BPZ:[id number] DOB: 1951-09-24 DOA: 05/17/2020  Referring MD/NP/PA: Carmon Sails, PA PCP: Vicenta Aly, [place]  Patient coming from: Group Home  Chief Complaint: Diarrhea, hypotension  HPI: Nicole [name] is a 69 y.o. 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 They reported initial hypotension, gave IVF en route. No recent abx, sick contacts, URI symptoms, changes in medications. Has been growing more weak generally for several weeks. They have an upcoming neurology appointment for evaluation to make recommendations regarding elevation in level of care. No dysarthria or new focal neurological changes.   Note sister, who is guardian, is at bedside providing most of the history.   ED Course: Initial vital signs, afebrile HR 80, BP 102/57. SCr above baseline at 1.19, BUN 28, CBC normal. The patient remained off her mental baseline per her sister, more drowsy, less talkative, has had >20 episodes of watery incontinent diarrhea. Abdominal pain is minimal, only when someone's pressing on it. Some red spots on pad when changing her.  Review of Systems: Denies fever, chills, weight loss, changes in vision or hearing (chronically left visual field vision loss), headache, cough, sore throat, chest pain, palpitations, shortness of breath, vomiting, change in bladder habits, myalgias, arthralgias, and rash, and per HPI. All others reviewed and are negative.   Past Medical History:  Diagnosis Date  . 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 Allergic rhinitis   . Alopecia   . Discoid lupus   . Hyperlipidemia   . Hypertension   . Menopause     . Moderate mental retardation   . Obesity   . Osteoporosis   . Pulmonary nodules   . Seizures ([place])    focal-being by Dr. Erling Cruz (should not take anti-histamines)   Past Surgical History:  Procedure Laterality Date  . VESICOVAGINAL FISTULA CLOSURE W/ TAH     - Life-long nonsmoker, no EtOH, no drugs. Lives in group home.  reports that she has never smoked. She has never used smokeless tobacco. She reports that she does not drink alcohol and does not use drugs. Allergies  Allergen Reactions  . Seasonal Ic [Cholestatin]     Runny nose   Family History  Problem Relation Age of Onset  . Breast cancer Mother    - Family history otherwise reviewed and not pertinent.  Prior to Admission medications   Medication Sig Start Date End Date Taking? Authorizing Provider  atorvastatin (LIPITOR) 10 MG tablet Take 10 mg by mouth daily. 12/08/19   [provider]  azelastine (ASTELIN) 137 MCG/SPRAY nasal spray Place 1 spray into the nose at bedtime. Use in each nostril as directed    [provider]  benzonatate (TESSALON) 100 MG capsule Take by mouth daily. Every 6 hours as needed    [provider]  budesonide (PULMICORT FLEXHALER) 180 MCG/ACT inhaler Inhale 1 puff into the lungs 2 (two) times daily. 09/10/19   [provider]  busPIRone (BUSPAR) 15 MG tablet Take 15 mg by mouth 2 (two) times daily.      [provider]  calcium carbonate (OS-CAL) 600 MG TABS tablet Take by mouth.    [provider]  carbamazepine (TEGRETOL XR) 100 MG 12 hr tablet Take 3 tablets (300 mg total) by mouth 2 (two) times daily. Reducing due to low sodium levels recently. 04/10/18   Huston Foley, MD  Cholecalciferol (VITAMIN D3) 2000 units TABS Take by mouth.    [provider]  diclofenac (VOLTAREN) 75 MG EC tablet  07/28/16   [provider]  gabapentin (NEURONTIN) 300 MG capsule Take 1 capsule (300 mg total) by mouth as directed. Taken tid on weekends,  taken qid on weekdays. 09/30/13   Kalman Shan, MD  levETIRAcetam (KEPPRA) 500 MG tablet Take 1 tablet (500 mg total) by mouth 2 (two) times daily. 04/10/18   Huston Foley, MD  losartan (COZAAR) 100 MG tablet Take 100 mg by mouth daily. 03/28/18   [provider]  mometasone (ASMANEX 60 METERED DOSES) 220 MCG/INH inhaler Inhale 2 puffs into the lungs daily. Patient not taking: Reported on 12/30/2019 09/02/12   Kalman Shan, MD  Multiple Vitamins-Minerals (CERTAVITE/ANTIOXIDANTS) TABS Take by mouth. Advance , daily    [provider]  pantoprazole (PROTONIX) 40 MG tablet  08/04/13   [provider]  polyethylene glycol powder (GLYCOLAX/MIRALAX) 17 GM/SCOOP powder Take by mouth daily. Take 1 capful at 4 pm    [provider]  Selenium 200 MCG TBCR Take 1 tablet by mouth daily.    [provider]  Wheat Dextrin (BENEFIBER) CHEW Chew 1 tablet by mouth 2 (two) times daily.      [provider]    Physical Exam: Vitals:   05/17/20 1810 05/17/20 1820 05/17/20 1830 05/17/20 1840  BP:      Pulse: 94 96 96 (!) 105  Resp: 18 17 17  (!) 21  Temp:      TempSrc:      SpO2: 95% 98% 100% 100%   Constitutional: 69 y.o. female in no distress, calm demeanor Eyes: Lids and conjunctivae normal, PERRL ENMT: Mucous membranes are tacky. Posterior pharynx clear of any exudate or lesions. Fair dentition.  Neck: normal, supple, no masses, no thyromegaly Respiratory: Non-labored breathing without accessory muscle use. Clear breath sounds to auscultation bilaterally Cardiovascular: Regular rate and rhythm, no murmurs, rubs, or gallops. No carotid bruits. No JVD. trace nonpitting LE edema. Normal pedal pulses. Abdomen: Hyperactive bowel sounds. Mild tenderness to palpation, non-distended, and no masses palpated. No hepatosplenomegaly. GU: No indwelling catheter Musculoskeletal: No clubbing / cyanosis. LLE shortened, weak comparatively. LUE decreased muscle bulk  and tone. Good ROM, no contractures. Skin: Cool, dry. No rashes, wounds, no ulcers. No wounds on visualized skin. Neurologic: CN II-XII grossly intact. Speech normal. Relative L hemiparesis. Intact sensation in all extremities. Psychiatric: Alert and oriented x3. Impaired judgment and insight. Mood euthymic with brusk affect.   Labs on Admission: I have personally reviewed following labs and imaging studies  CBC: Recent Labs  Lab 05/17/20 1339  WBC 8.7  NEUTROABS 7.6  HGB 15.0  HCT 45.5  MCV 100.0  PLT 228   Basic Metabolic Panel: Recent Labs  Lab 05/17/20 1339  NA 137  K 4.9  CL 100  CO2 25  GLUCOSE 118*  BUN 28*  CREATININE 1.19*  CALCIUM 10.6*  MG 2.8*   Liver Function Tests: Recent Labs  Lab 05/17/20 1339  AST 29  ALT 19  ALKPHOS 88  BILITOT 1.0  PROT 6.8  ALBUMIN 3.5    EKG: Independently reviewed. NSR, vent rate 82bpm  Assessment/Plan Principal Problem:   Altered mental status Active Problems:   Allergic rhinitis  Hyperlipidemia   Hypertension   Hypotension   Seizures (HCC)   AKI (acute kidney injury) (HCC)   Dehydration   Diarrhea   AMS, lethargy: Likely due to dehydration from profuse diarrhea.  - Continue volume resuscitation as below, at high risk of delirium, will institute delirium precautions - Check covid (no respiratory symptoms)  Severe diarrhea: >20 episodes thus far today. Pt did get a laxative yesterday, but only one and that would not be expected to cause this severity. Pt also with some reported blood admixed with liquid stool now.  - GI pathogen panel, empiric enteric precautions. Await r/o infection to give imodium.  - If bleeding worsens (likely due to already significant stool output) or pain worsens, would need abdominal imaging. Exam currently benign  AKI: Due to dehydration. Suspect stool output is increased since initial labs.  - Will increase IVF rate.  - Hold losartan  Hypotension: Due to dehydration, likely cause  of slumping over, cool extremities. Since improved. MAP consistently >70. - Monitor VSs - Hold losartan  ?Syncope: Unclear history.  - Will keep on telemetry monitoring  Seizure disorder: No recent seizures. Carbamazepine level is therapeutic.  - Continue AEDs - Seizure precautions  HLD:  - Continue statin  DVT prophylaxis: SCDs with rectal bleeding  Code Status: DNR confirmed on admission  Family Communication: Sister who is also guardian at bedside Disposition Plan: Place in observation, get PT evaluation, likely return to group home if strength returns with volume resuscitation.  Consults called: None  Admission status: Observation    Tyrone Nine, MD [facility] [email or web address] 05/17/2020, 7:18 PM

## 2020-05-18 ENCOUNTER — Other Ambulatory Visit: Payer: Self-pay

## 2020-05-18 DIAGNOSIS — M810 Age-related osteoporosis without current pathological fracture: Secondary | ICD-10-CM | POA: Diagnosis present

## 2020-05-18 DIAGNOSIS — R4182 Altered mental status, unspecified: Secondary | ICD-10-CM | POA: Diagnosis present

## 2020-05-18 DIAGNOSIS — R569 Unspecified convulsions: Secondary | ICD-10-CM | POA: Diagnosis not present

## 2020-05-18 DIAGNOSIS — F71 Moderate intellectual disabilities: Secondary | ICD-10-CM | POA: Diagnosis present

## 2020-05-18 DIAGNOSIS — L930 Discoid lupus erythematosus: Secondary | ICD-10-CM | POA: Diagnosis present

## 2020-05-18 DIAGNOSIS — I5030 Unspecified diastolic (congestive) heart failure: Secondary | ICD-10-CM | POA: Diagnosis present

## 2020-05-18 DIAGNOSIS — Z20822 Contact with and (suspected) exposure to covid-19: Secondary | ICD-10-CM | POA: Diagnosis present

## 2020-05-18 DIAGNOSIS — G931 Anoxic brain damage, not elsewhere classified: Secondary | ICD-10-CM | POA: Diagnosis present

## 2020-05-18 DIAGNOSIS — I110 Hypertensive heart disease with heart failure: Secondary | ICD-10-CM | POA: Diagnosis present

## 2020-05-18 DIAGNOSIS — I69354 Hemiplegia and hemiparesis following cerebral infarction affecting left non-dominant side: Secondary | ICD-10-CM | POA: Diagnosis not present

## 2020-05-18 DIAGNOSIS — E875 Hyperkalemia: Secondary | ICD-10-CM | POA: Diagnosis not present

## 2020-05-18 DIAGNOSIS — E876 Hypokalemia: Secondary | ICD-10-CM | POA: Diagnosis not present

## 2020-05-18 DIAGNOSIS — G9341 Metabolic encephalopathy: Secondary | ICD-10-CM | POA: Diagnosis present

## 2020-05-18 DIAGNOSIS — R197 Diarrhea, unspecified: Secondary | ICD-10-CM | POA: Diagnosis present

## 2020-05-18 DIAGNOSIS — E860 Dehydration: Secondary | ICD-10-CM | POA: Diagnosis present

## 2020-05-18 DIAGNOSIS — N179 Acute kidney failure, unspecified: Secondary | ICD-10-CM | POA: Diagnosis present

## 2020-05-18 DIAGNOSIS — R55 Syncope and collapse: Secondary | ICD-10-CM | POA: Diagnosis present

## 2020-05-18 DIAGNOSIS — Z66 Do not resuscitate: Secondary | ICD-10-CM | POA: Diagnosis present

## 2020-05-18 DIAGNOSIS — G40909 Epilepsy, unspecified, not intractable, without status epilepticus: Secondary | ICD-10-CM | POA: Diagnosis present

## 2020-05-18 DIAGNOSIS — E785 Hyperlipidemia, unspecified: Secondary | ICD-10-CM | POA: Diagnosis present

## 2020-05-18 DIAGNOSIS — I959 Hypotension, unspecified: Secondary | ICD-10-CM | POA: Diagnosis present

## 2020-05-18 DIAGNOSIS — F552 Abuse of laxatives: Secondary | ICD-10-CM | POA: Diagnosis present

## 2020-05-18 DIAGNOSIS — J309 Allergic rhinitis, unspecified: Secondary | ICD-10-CM | POA: Diagnosis present

## 2020-05-18 DIAGNOSIS — K567 Ileus, unspecified: Secondary | ICD-10-CM | POA: Diagnosis present

## 2020-05-18 LAB — MRSA PCR SCREENING: MRSA by PCR: POSITIVE — AB

## 2020-05-18 LAB — GASTROINTESTINAL PANEL BY PCR, STOOL (REPLACES STOOL CULTURE)

## 2020-05-18 LAB — CBC
HCT: 39.4 % (ref 36.0–46.0)
Hemoglobin: 12.9 g/dL (ref 12.0–15.0)
MCH: 32.6 pg (ref 26.0–34.0)
MCHC: 32.7 g/dL (ref 30.0–36.0)
MCV: 99.5 fL (ref 80.0–100.0)
Platelets: 193 10*3/uL (ref 150–400)
RBC: 3.96 MIL/uL (ref 3.87–5.11)
RDW: 13.1 % (ref 11.5–15.5)
WBC: 11 10*3/uL — ABNORMAL HIGH (ref 4.0–10.5)
nRBC: 0 % (ref 0.0–0.2)

## 2020-05-18 LAB — BASIC METABOLIC PANEL
Anion gap: 11 (ref 5–15)
BUN: 30 mg/dL — ABNORMAL HIGH (ref 8–23)
CO2: 24 mmol/L (ref 22–32)
Calcium: 9.1 mg/dL (ref 8.9–10.3)
Chloride: 99 mmol/L (ref 98–111)
Creatinine, Ser: 1.19 mg/dL — ABNORMAL HIGH (ref 0.44–1.00)
GFR calc Af Amer: 54 mL/min — ABNORMAL LOW (ref >60)
GFR calc non Af Amer: 46 mL/min — ABNORMAL LOW (ref >60)
Glucose, Bld: 114 mg/dL — ABNORMAL HIGH (ref 70–99)
Potassium: 5.5 mmol/L — ABNORMAL HIGH (ref 3.5–5.1)
Sodium: 134 mmol/L — ABNORMAL LOW (ref 135–145)

## 2020-05-18 LAB — POTASSIUM
Potassium: 5.5 mmol/L — ABNORMAL HIGH (ref 3.5–5.1)
Potassium: 5.1 mmol/L (ref 3.5–5.1)

## 2020-05-18 MED ORDER — LOPERAMIDE HCL 2 MG PO CAPS: 2.0000 mg | ORAL_CAPSULE | ORAL | Status: DC | PRN

## 2020-05-18 MED ORDER — MUPIROCIN 2 % EX OINT
1.0000 "application " | TOPICAL_OINTMENT | Freq: Two times a day (BID) | CUTANEOUS | Status: AC
  Administered 2020-05-18 – 2020-05-23 (×10): 1 via NASAL
  Filled 2020-05-18: qty 22

## 2020-05-18 MED ORDER — SODIUM ZIRCONIUM CYCLOSILICATE 5 G PO PACK
5.0000 g | PACK | Freq: Two times a day (BID) | ORAL | Status: DC
  Administered 2020-05-18 (×2): 5 g via ORAL
  Filled 2020-05-18 (×3): qty 1

## 2020-05-18 MED ORDER — CHLORHEXIDINE GLUCONATE CLOTH 2 % EX PADS
6.0000 | MEDICATED_PAD | Freq: Every day | CUTANEOUS | Status: AC
  Administered 2020-05-19 – 2020-05-23 (×5): 6 via TOPICAL

## 2020-05-18 NOTE — Progress Notes (Signed)
CRITICAL VALUE ALERT  Critical Value:  MRSA swab positive  Date & Time Notied:  05/18/20 1430  Provider Notified: Dr Jarvis Newcomer  Orders Received/Actions taken: standing protocol ordered

## 2020-05-18 NOTE — Progress Notes (Signed)
PROGRESS NOTE  Nicole [name]  FWY:[id number] DOB: 1951/03/26 DOA: 05/17/2020 PCP: Vicenta Aly, [name]   Brief Narrative: Nicole [name] is a 69 y.o. 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 She was afebrile and hypotensive, improved with fluids. SCr above baseline at 1.19, BUN 28. The patient remained off her mental baseline per her sister, more drowsy, less talkative, has had >20 episodes of watery incontinent diarrhea. Some bloody stools now reported and diarrhea continues. CDiff negative, though pathogen panel still pending.  Assessment & Plan: Principal Problem:   Altered mental status Active Problems:   Allergic rhinitis   Hyperlipidemia   Hypertension   Hypotension   Seizures (HCC)   AKI (acute kidney injury) ([place])   Dehydration   Diarrhea  Altered mental status: The patient is still less interactive than her baseline, suspected to be due to dehydration/kidney injury.  - Continue supportive care for conditions below, delirium precautions.   Diarrhea: Continues, though abdominal exam is reassuring. CDiff negative - Monitor pathogen panel, empiric enteric precautions. Will give imodium.  - Reports of scant blood in stool. Check FOBT. Hgb still wnl. Will continue monitoring, consider GI evaluation.   AKI: Due to dehydration. Unchanged since admission despite pushing po and giving IVF.  - Continue IVF at current rate and recheck BMP in AM.  Hypotension: Resolved.  - Holding losartan  Possible syncope: Unclear exactly what happened. Possibly slumping over in chair, poorly responsive. Likely from dehydration.  - Continue telemetry.   Seizure disorder: Tegretol level is therapeutic - Continue AEDs, seizure precautions.   Hyperkalemia:  - Lokelma started, hoping to get repeat potassium level this  PM though pt is refusing blood draws.  - Continue holding losartan - Continue telemetry.   HLD:  - Continue statin.  DVT prophylaxis: SCDs Code Status: DNR Family Communication: Sister who is also guardian, at bedside Disposition Plan:  Status is: Inpatient  Remains inpatient appropriate because:Persistent severe electrolyte disturbances, Altered mental status and IV treatments appropriate due to intensity of illness or inability to take PO. Not back to a functional level to return to group home per PT/OT.   Dispo: The patient is from: Group home              Anticipated d/c is to: SNF              Anticipated d/c date is: 2 days              Patient currently is not medically stable to d/c.  Consultants:   None  Procedures:   None  Antimicrobials:  None   Subjective: Confused, but reports mild abdominal cramping with stools or if she happens to cough. No chest pain, dyspnea. Some blood on pad with frequent incontinent loose stools.   Objective: Vitals:   05/18/20 0653 05/18/20 0755 05/18/20 1300 05/18/20 1425  BP: (!) 116/41  (!) 102/53   Pulse: 98 99 (!) 101   Resp: 18 16 16    Temp: 98.8 F (37.1 C)  98.2 F (36.8 C)   TempSrc: Oral  Oral   SpO2: 93% 100% 94%   Weight:    85 kg  Height:    5\' 7"  (1.702 m)    Intake/Output Summary (Last 24 hours) at 05/18/2020 1550 Last data filed at 05/18/2020 0734 Gross per 24 hour  Intake  1081.55 ml  Output --  Net 1081.55 ml   Filed Weights   05/18/20 1425  Weight: 85 kg    Gen: 69 y.o. female in no distress  Pulm: Non-labored breathing room air. Clear to auscultation bilaterally.  CV: Regular rate and rhythm. No murmur, rub, or gallop. No JVD, no pedal edema. GI: Abdomen soft, minimally diffusely tender, non-distended, with normoactive bowel sounds. No organomegaly or masses felt. Ext: Warm, no deformities Skin: No rashes, lesions no ulcers Neuro: Alert and incompletely oriented. Left UE > LE hemiparesis is  stable, no new focal neurological deficits. Psych: Judgement and insight appear impaired. More interactive than yesterday. Mood & affect appropriate.   Data Reviewed: I have personally reviewed following labs and imaging studies  CBC: Recent Labs  Lab 05/17/20 1339 05/18/20 0418  WBC 8.7 11.0*  NEUTROABS 7.6  --   HGB 15.0 12.9  HCT 45.5 39.4  MCV 100.0 99.5  PLT 228 193   Basic Metabolic Panel: Recent Labs  Lab 05/17/20 1339 05/18/20 0418  NA 137 134*  K 4.9 5.5*  5.5*  CL 100 99  CO2 25 24  GLUCOSE 118* 114*  BUN 28* 30*  CREATININE 1.19* 1.19*  CALCIUM 10.6* 9.1  MG 2.8*  --    GFR: Estimated Creatinine Clearance: 49.3 mL/min (A) (by C-G formula based on SCr of 1.19 mg/dL (H)). Liver Function Tests: Recent Labs  Lab 05/17/20 1339  AST 29  ALT 19  ALKPHOS 88  BILITOT 1.0  PROT 6.8  ALBUMIN 3.5   No results for input(s): LIPASE, AMYLASE in the last 168 hours. No results for input(s): AMMONIA in the last 168 hours. Coagulation Profile: No results for input(s): INR, PROTIME in the last 168 hours. Cardiac Enzymes: No results for input(s): CKTOTAL, CKMB, CKMBINDEX, TROPONINI in the last 168 hours. BNP (last 3 results) No results for input(s): PROBNP in the last 8760 hours. HbA1C: No results for input(s): HGBA1C in the last 72 hours. CBG: No results for input(s): GLUCAP in the last 168 hours. Lipid Profile: No results for input(s): CHOL, HDL, LDLCALC, TRIG, CHOLHDL, LDLDIRECT in the last 72 hours. Thyroid Function Tests: No results for input(s): TSH, T4TOTAL, FREET4, T3FREE, THYROIDAB in the last 72 hours. Anemia Panel: No results for input(s): VITAMINB12, FOLATE, FERRITIN, TIBC, IRON, RETICCTPCT in the last 72 hours. Urine analysis: No results found for: COLORURINE, APPEARANCEUR, LABSPEC, PHURINE, GLUCOSEU, HGBUR, BILIRUBINUR, KETONESUR, PROTEINUR, UROBILINOGEN, NITRITE, LEUKOCYTESUR Recent Results (from the past 240 hour(s))  Gastrointestinal Panel by  PCR , Stool     Status: None   Collection Time: 05/17/20  3:22 PM   Specimen: Stool  Result Value Ref Range Status   Campylobacter species NOT DETECTED NOT DETECTED Final   Plesimonas shigelloides NOT DETECTED NOT DETECTED Final   Salmonella species NOT DETECTED NOT DETECTED Final   Yersinia enterocolitica NOT DETECTED NOT DETECTED Final   Vibrio species NOT DETECTED NOT DETECTED Final   Vibrio cholerae NOT DETECTED NOT DETECTED Final   Enteroaggregative E coli (EAEC) NOT DETECTED NOT DETECTED Final   Enteropathogenic E coli (EPEC) NOT DETECTED NOT DETECTED Final   Enterotoxigenic E coli (ETEC) NOT DETECTED NOT DETECTED Final   Shiga like toxin producing E coli (STEC) NOT DETECTED NOT DETECTED Final   Shigella/Enteroinvasive E coli (EIEC) NOT DETECTED NOT DETECTED Final   Cryptosporidium NOT DETECTED NOT DETECTED Final   Cyclospora cayetanensis NOT DETECTED NOT DETECTED Final   Entamoeba histolytica NOT DETECTED NOT DETECTED Final   Giardia lamblia  NOT DETECTED NOT DETECTED Final   Adenovirus F40/41 NOT DETECTED NOT DETECTED Final   Astrovirus NOT DETECTED NOT DETECTED Final   Norovirus GI/GII NOT DETECTED NOT DETECTED Final   Rotavirus A NOT DETECTED NOT DETECTED Final   Sapovirus (I, II, IV, and V) NOT DETECTED NOT DETECTED Final    Comment: Performed at [facility], [address], [place], [place] [address]  C Difficile Quick Screen w PCR reflex     Status: None   Collection Time: 05/17/20  3:22 PM   Specimen: STOOL  Result Value Ref Range Status   C Diff antigen NEGATIVE NEGATIVE Final   C Diff toxin NEGATIVE NEGATIVE Final   C Diff interpretation No C. difficile detected.  Final    Comment: Performed at [facility], [address] [place], [place] [address]  SARS Coronavirus 2 by RT PCR (hospital order, performed in [facility] hospital lab) Nasopharyngeal Nasopharyngeal Swab     Status: None   Collection Time: 05/17/20  6:52 PM   Specimen:  Nasopharyngeal Swab  Result Value Ref Range Status   SARS Coronavirus 2 NEGATIVE NEGATIVE Final    Comment: (NOTE) SARS-CoV-2 target nucleic acids are NOT DETECTED.  The SARS-CoV-2 RNA is generally detectable in upper and lower respiratory specimens during the acute phase of infection. The lowest concentration of SARS-CoV-2 viral copies this assay can detect is 250 copies / mL. A negative result does not preclude SARS-CoV-2 infection and should not be used as the sole basis for treatment or other patient management decisions.  A negative result may occur with improper specimen collection / handling, submission of specimen other than nasopharyngeal swab, presence of viral mutation(s) within the areas targeted by this assay, and inadequate number of viral copies (<250 copies / mL). A negative result must be combined with clinical observations, patient history, and epidemiological information.  Fact Sheet for Patients:   BoilerBrush.com.cy  Fact Sheet for Healthcare Providers: https://pope.com/  This test is not yet approved or  cleared by the [place] FDA and has been authorized for detection and/or diagnosis of SARS-CoV-2 by FDA under an Emergency Use Authorization (EUA).  This EUA will remain in effect (meaning this test can be used) for the duration of the COVID-19 declaration under Section 564(b)(1) of the Act, 21 U.S.C. section 360bbb-3(b)(1), unless the authorization is terminated or revoked sooner.  Performed at [facility], [address], [place], [place] [address]   MRSA PCR Screening     Status: Abnormal   Collection Time: 05/18/20  6:59 AM   Specimen: Nasopharyngeal  Result Value Ref Range Status   MRSA by PCR POSITIVE (A) NEGATIVE Final    Comment:        The GeneXpert MRSA Assay (FDA approved for NASAL specimens only), is one component of a comprehensive MRSA colonization surveillance  program. It is not intended to diagnose MRSA infection nor to guide or monitor treatment for MRSA infections. RESULT CALLED TO, READ BACK BY AND VERIFIED WITH: BULLINS,M. RN @1428  05/18/20 BILLINGSLEY,L Performed at [facility], [address] [place], [place]       Radiology Studies: No results found.  Scheduled Meds: . atorvastatin  10 mg Oral Daily  . budesonide (PULMICORT) nebulizer solution  0.25 mg Nebulization BID  . busPIRone  15 mg Oral BID  . carbamazepine  300 mg Oral BID  . [START ON 05/19/2020] Chlorhexidine Gluconate Cloth  6 each Topical Q0600  . gabapentin  300 mg Oral QID  . levETIRAcetam  500 mg Oral BID  . mupirocin ointment  1 application Nasal BID  . pantoprazole  40 mg Oral Daily  . sodium chloride flush  3 mL Intravenous Q12H  . sodium zirconium cyclosilicate  5 g Oral BID   Continuous Infusions: . lactated ringers 100 mL/hr at 05/18/20 0658     LOS: 0 days   Time spent: 25 minutes.  Tyrone Nine, MD [facility] [email or web address] 05/18/2020, 3:50 PM

## 2020-05-18 NOTE — Progress Notes (Signed)
Patient had loose stool this morning and small amt of blood noted.

## 2020-05-18 NOTE — Evaluation (Signed)
Physical Therapy Evaluation Patient Details Name: Nicole [name] MRN: [id number] DOB: May 20, 1951 Today's Date: 05/18/2020   History of Present Illness  69 yo female admitted with AMS, diarrhea, hypotension. 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 Pt is from a group home.  Clinical Impression  On eval, pt was Max assist +2 for mobility. She sat EOB for at least 5 minutes. Pt denied dizziness this session. Worked on sitting balance. Sister was present during session. She reported that pt's functional mobility has been declining over last 2 months. Recommend SNF for rehab if pt is agreeable.     Follow Up Recommendations SNF    Equipment Recommendations  None recommended by PT    Recommendations for Other Services OT consult     Precautions / Restrictions Precautions Precautions: Fall Restrictions Weight Bearing Restrictions: No      Mobility  Bed Mobility Overal bed mobility: Needs Assistance Bed Mobility: Rolling;Supine to Sit;Sit to Supine Rolling: Max assist   Supine to sit: Max assist;+2 for physical assistance;+2 for safety/equipment;HOB elevated Sit to supine: Max assist;+2 for physical assistance;+2 for safety/equipment;HOB elevated   General bed mobility comments: Assist for trunk and bil LEs. Cues for technique. Minimal assistance from pt with R UE/R LE. Utilized bedpad for scooting, positioning. Multimodal cueing required. Pt denied dizziness-BP WNL  Transfers                 General transfer comment: NT  Ambulation/Gait                Stairs            Wheelchair Mobility    Modified Rankin (Stroke Patients Only)       Balance Overall balance assessment: Needs assistance Sitting-balance support: Single extremity supported;Feet supported Sitting balance-Leahy Scale: Poor Sitting balance - Comments: Worked on static  sitting balance-moderate Right lean, minimal anterior lean with slouched posturing. Pt able to self correct intermittently but unable to maintain correction beyond 10-15 seconds. Postural control: Right lateral lean                                   Pertinent Vitals/Pain Pain Assessment: Faces Faces Pain Scale: Hurts a little bit Pain Location: shoulder blades, LEs Pain Descriptors / Indicators: Grimacing;Discomfort;Sore Pain Intervention(s): Limited activity within patient's tolerance;Monitored during session;Repositioned    Home Living Family/patient expects to be discharged to:: Group home                      Prior Function Level of Independence: Needs assistance   Gait / Transfers Assistance Needed: WC bound for last 2 months requring +2 assist for transfers (toilet/WC). Prior to that, pt was ambulatory with a walker and assistance. Has AFO-unsure of how often she wears it.  ADL's / Homemaking Assistance Needed: Needs assistance for ADLs - toileting, dressing and at times feeding.  Comments: Reports performing office tasks from wc level 3 hrs a day.     Hand Dominance   Dominant Hand: Right    Extremity/Trunk Assessment   Upper Extremity Assessment Upper Extremity Assessment: Defer to OT evaluation LUE Deficits / Details: Hx of L sided weakness. Shoulder ROM less than 90 degrees.    Lower Extremity Assessment Lower Extremity Assessment: RLE deficits/detail;LLE deficits/detail RLE Deficits / Details: hip flex 2/5, knee ext 3/5,  DF/PF 3/5 LLE Deficits / Details: hip flex 2-/5, knee ext 2-/5, DF/PF 2-/5       Communication   Communication: No difficulties  Cognition Arousal/Alertness: Awake/alert Behavior During Therapy: WFL for tasks assessed/performed Overall Cognitive Status: History of cognitive impairments - at baseline                                        General Comments      Exercises     Assessment/Plan     PT Assessment Patient needs continued PT services  PT Problem List Decreased strength;Decreased mobility;Decreased activity tolerance;Decreased balance       PT Treatment Interventions      PT Goals (Current goals can be found in the Care Plan section)  Acute Rehab PT Goals Patient Stated Goal: To improve strength and functional abilities PT Goal Formulation: With patient/family Time For Goal Achievement: 06/01/20 Potential to Achieve Goals: Fair    Frequency Min 2X/week   Barriers to discharge        Co-evaluation               AM-PAC PT "6 Clicks" Mobility  Outcome Measure Help needed turning from your back to your side while in a flat bed without using bedrails?: Total Help needed moving from lying on your back to sitting on the side of a flat bed without using bedrails?: Total Help needed moving to and from a bed to a chair (including a wheelchair)?: Total Help needed standing up from a chair using your arms (e.g., wheelchair or bedside chair)?: Total Help needed to walk in hospital room?: Total Help needed climbing 3-5 steps with a railing? : Total 6 Click Score: 6    End of Session   Activity Tolerance: Patient tolerated treatment well Patient left: in bed;with call bell/phone within reach;with bed alarm set;with family/visitor present   PT Visit Diagnosis: Muscle weakness (generalized) (M62.81);History of falling (Z91.81)    Time: 1856-3149 PT Time Calculation (min) (ACUTE ONLY): 21 min   Charges:   PT Evaluation $PT Eval Low Complexity: 1 Low         Doreatha Massed, PT Acute Rehabilitation  Office: [phone] Pager: [phone]     [facility] 05/18/2020, 1:10 PM

## 2020-05-18 NOTE — Plan of Care (Signed)

## 2020-05-18 NOTE — Evaluation (Signed)
Occupational Therapy Evaluation Patient Details Name: Nicole [name] MRN: [id number] DOB: 03/03/1951 Today's Date: 05/18/2020    History of Present Illness Nicole [name] is a 69 y.o. 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 They reported initial hypotension, gave IVF en route. No recent abx, sick contacts, URI symptoms, changes in medications. Has been growing more weak generally for several weeks. They have an upcoming neurology appointment for evaluation to make recommendations regarding elevation in level of care. No dysarthria or new focal neurological changes.   Clinical Impression   Nicole [name] is a 69 year old woman with a fairly recent decline in functional abilities (the last month) who typically lives in a group home and is now requiring assistance of two for wc transfers and ADLs. On evaluation patient presents with generalized weakness, left sided deficits and cognitive impairment from birth, poor activity tolerance, reports of pain in lower extremities, impaired balance  and reports of dizziness seated at side of bed resulting in impaired ability to perform baseline mobility and ADLs. Patient will benefit from skilled OT services to improve deficits and return to PLOF.     Follow Up Recommendations  SNF    Equipment Recommendations   (defer to next level of care.)    Recommendations for Other Services       Precautions / Restrictions Precautions Precautions: Fall Restrictions Weight Bearing Restrictions: No      Mobility Bed Mobility Overal bed mobility: Needs Assistance Bed Mobility: Supine to Sit     Supine to sit: Max assist     General bed  mobility comments: max assist for both lower extremities, able to assist with RLE minimally, and trunk negotiation. Max x 2 to return to supine and for bed mobility.  Transfers                 General transfer comment: Unable to stand. Patient reporting dizziness at edge of bed and returned to supine.    Balance Overall balance assessment: Needs assistance Sitting-balance support: Feet supported;Single extremity supported   Sitting balance - Comments: Patient propping self with RUE and falling to the right. Postural control: Right lateral lean                                 ADL either performed or assessed with clinical judgement   ADL Overall ADL's : Needs assistance/impaired Eating/Feeding: Set up;Maximal assistance;Bed level Eating/Feeding Details (indicate cue type and reason): Patient's sister fed patient this morning - reporting difficulty controlling RUE Grooming: Set up;Bed level;Wash/dry hands;Wash/dry face   Upper Body Bathing: Bed level;Moderate assistance   Lower Body Bathing: Bed level;Set up;Maximal assistance   Upper Body Dressing : Moderate assistance;Bed level   Lower Body Dressing: Total assistance;Bed level;+2 for physical assistance     Toilet Transfer Details (indicate cue type and reason): unable Toileting- Clothing Manipulation and Hygiene: Total assistance;+2 for physical assistance;Bed level     Tub/Shower Transfer Details (indicate cue type and reason): unable Functional mobility during ADLs: +2 for physical assistance       Vision Baseline Vision/History: Wears glasses Patient Visual Report: 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 Shoulder ROM less than 90 degrees.           Communication Communication Communication: No difficulties   Cognition Arousal/Alertness: Awake/alert Behavior During Therapy: WFL for tasks assessed/performed Overall Cognitive Status: History of cognitive impairments - at baseline                                     General Comments       Exercises     Shoulder Instructions      Home Living Family/patient expects to be discharged to:: Group home                                        Prior Functioning/Environment Level of Independence: Needs assistance  Gait / Transfers Assistance Needed: Within the last month recuiring +2 to pivot to wc. Reports history of left knee over extending and wears brace. Attempts to use walker to improve ambulation have failed. ADL's / Homemaking Assistance Needed: Needs assistance for ADLs - toileting, dressing and at times feeding.   Comments: Reports performing office tasks from wc level 3 hrs a day.        OT Problem List: Decreased strength;Decreased activity tolerance;Impaired balance (sitting and/or standing);Decreased range of motion;Impaired vision/perception;Decreased cognition;Decreased safety awareness;Decreased knowledge of use of DME or AE;Decreased knowledge of precautions;Pain      OT Treatment/Interventions: Self-care/ADL training;Therapeutic exercise;Neuromuscular education;DME and/or AE instruction;Therapeutic activities;Balance training;Patient/family education    OT Goals(Current goals can be found in the care plan section) Acute Rehab OT Goals Patient Stated Goal: To improve strength and functional abilities OT Goal Formulation: With family Time For Goal Achievement: 06/01/20 Potential to Achieve Goals: Fair  OT Frequency:  Min 2X/week   Barriers to D/C: Decreased caregiver support  Patient living in group home with a decline in function.       Co-evaluation              AM-PAC OT "6 Clicks" Daily Activity     Outcome Measure Help from another person eating meals?: A Lot Help from another person taking care of personal grooming?: A Little Help from another person toileting, which includes using toliet, bedpan, or urinal?: Total Help from another person bathing (including washing, rinsing, drying)?: A Lot Help from another person to put on and taking off regular upper body clothing?: A Lot Help from another person to put on and taking off regular lower body clothing?: Total 6 Click Score: 11   End of Session Nurse Communication: Mobility status  Activity Tolerance: Patient limited by fatigue Patient left: in bed;with call bell/phone within reach;with bed alarm set;with family/visitor present  OT Visit Diagnosis: Muscle weakness (generalized) (M62.81);Other symptoms and signs involving the nervous system (R29.898);Other symptoms and signs involving cognitive function;Pain;Hemiplegia and hemiparesis Hemiplegia - Right/Left: Left Hemiplegia - dominant/non-dominant: Non-Dominant Hemiplegia - caused by: Unspecified Pain - Right/Left: Right Pain - part  of body: Leg                Time: 8563-1497 OT Time Calculation (min): 29 min Charges:  OT General Charges $OT Visit: 1 Visit OT Evaluation $OT Eval Moderate Complexity: 1 Mod  Latrica Clowers, OTR/[facility] [place]  Office [phone] Pager: [place] 05/18/2020, 10:19 AM

## 2020-05-19 ENCOUNTER — Inpatient Hospital Stay (HOSPITAL_COMMUNITY): Payer: Medicare Other

## 2020-05-19 LAB — CBC
HCT: 37.4 % (ref 36.0–46.0)
Hemoglobin: 12.5 g/dL (ref 12.0–15.0)
MCH: 33.2 pg (ref 26.0–34.0)
MCHC: 33.4 g/dL (ref 30.0–36.0)
MCV: 99.2 fL (ref 80.0–100.0)
Platelets: 186 10*3/uL (ref 150–400)
RBC: 3.77 MIL/uL — ABNORMAL LOW (ref 3.87–5.11)
RDW: 13 % (ref 11.5–15.5)
WBC: 10.5 10*3/uL (ref 4.0–10.5)
nRBC: 0 % (ref 0.0–0.2)

## 2020-05-19 LAB — BASIC METABOLIC PANEL
Anion gap: 8 (ref 5–15)
BUN: 25 mg/dL — ABNORMAL HIGH (ref 8–23)
CO2: 25 mmol/L (ref 22–32)
Calcium: 8.9 mg/dL (ref 8.9–10.3)
Chloride: 100 mmol/L (ref 98–111)
Creatinine, Ser: 0.61 mg/dL (ref 0.44–1.00)
GFR calc Af Amer: >60 mL/min (ref >60)
GFR calc non Af Amer: >60 mL/min (ref >60)
Glucose, Bld: 160 mg/dL — ABNORMAL HIGH (ref 70–99)
Potassium: 4.1 mmol/L (ref 3.5–5.1)
Sodium: 133 mmol/L — ABNORMAL LOW (ref 135–145)

## 2020-05-19 MED ORDER — SODIUM CHLORIDE 0.9 % IV SOLN: INTRAVENOUS | Status: DC

## 2020-05-19 MED ORDER — LEVETIRACETAM IN NACL 500 MG/100ML IV SOLN
500.0000 mg | Freq: Two times a day (BID) | INTRAVENOUS | Status: DC
  Administered 2020-05-19 – 2020-05-23 (×8): 500 mg via INTRAVENOUS
  Filled 2020-05-19 (×9): qty 100

## 2020-05-19 MED ORDER — ALBUTEROL SULFATE (2.5 MG/3ML) 0.083% IN NEBU: 2.5000 mg | INHALATION_SOLUTION | Freq: Four times a day (QID) | RESPIRATORY_TRACT | Status: DC | PRN

## 2020-05-19 MED ORDER — METOCLOPRAMIDE HCL 5 MG/ML IJ SOLN
5.0000 mg | Freq: Once | INTRAMUSCULAR | Status: AC
  Administered 2020-05-19: 5 mg via INTRAVENOUS
  Filled 2020-05-19: qty 2

## 2020-05-19 MED ORDER — GABAPENTIN 400 MG PO CAPS
400.0000 mg | ORAL_CAPSULE | Freq: Three times a day (TID) | ORAL | Status: DC
  Administered 2020-05-21 – 2020-05-27 (×19): 400 mg via ORAL
  Filled 2020-05-19 (×20): qty 1

## 2020-05-19 MED ORDER — MAGIC MOUTHWASH
5.0000 mL | Freq: Four times a day (QID) | ORAL | Status: DC
  Administered 2020-05-21 – 2020-05-23 (×4): 5 mL via ORAL
  Filled 2020-05-19 (×36): qty 5

## 2020-05-19 MED ORDER — PANTOPRAZOLE SODIUM 40 MG IV SOLR
40.0000 mg | Freq: Every day | INTRAVENOUS | Status: DC
  Administered 2020-05-19 – 2020-05-20 (×2): 40 mg via INTRAVENOUS
  Filled 2020-05-19 (×2): qty 40

## 2020-05-19 MED ORDER — SUCRALFATE 1 GM/10ML PO SUSP: 1.0000 g | Freq: Three times a day (TID) | ORAL | Status: DC

## 2020-05-19 NOTE — Progress Notes (Signed)
PROGRESS NOTE  Nicole [name]  TGG:[id number] DOB: 1951-11-02 DOA: 05/17/2020 PCP: Elizabeth Palau, [name]   Brief Narrative: Nicole [name] is a 69 y.o. 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 She was afebrile and hypotensive, improved with fluids. SCr above baseline at 1.19, BUN 28. The patient remained off her mental baseline per her sister, more drowsy, less talkative, has had >20 episodes of watery incontinent diarrhea. Some bloody stools now reported and diarrhea continues. CDiff negative.  Assessment & Plan: Principal Problem:   Altered mental status Active Problems:   Allergic rhinitis   Hyperlipidemia   Hypertension   Hypotension   Seizures (HCC)   AKI (acute kidney injury) (HCC)   Dehydration   Diarrhea  Altered mental status: The patient is still less interactive than her baseline, suspected to be due to dehydration/kidney injury.  - Continue supportive care for conditions below, delirium precautions.   Small bowel obstruction. NG tube inserted. Monitor blood Low intermittent suction production medication adjusted N.p.o. for IV fluids.  Diarrhea: Continues, though abdominal exam is reassuring. CDiff negative  AKI: Due to dehydration. Unchanged since admission despite pushing po and giving IVF.  - Continue IVF at current rate and recheck BMP in AM.  Hypotension: Resolved.  - Holding losartan  Possible syncope: Unclear exactly what happened. Possibly slumping over in chair, poorly responsive. Likely from dehydration.  - Continue telemetry.   Seizure disorder: Tegretol level is therapeutic - Continue AEDs, seizure precautions.   Hyperkalemia:  - Lokelma started, hoping to get repeat potassium level this PM though pt is refusing blood draws.  - Continue holding losartan - Continue  telemetry.   HLD:  - Continue statin.  DVT prophylaxis: SCDs Code Status: DNR Family Communication: Sister who is also guardian, at bedside Disposition Plan:  Status is: Inpatient  Remains inpatient appropriate because:Persistent severe electrolyte disturbances, Altered mental status and IV treatments appropriate due to intensity of illness or inability to take PO. Not back to a functional level to return to group home per PT/OT.   Dispo: The patient is from: Group home              Anticipated d/c is to: SNF              Anticipated d/c date is: 2 days              Patient currently is not medically stable to d/c.  Consultants:   None  Procedures:   None  Antimicrobials:  None   Subjective: Confused but at baseline. Pain family reports patient is sleepy and excessively tired. 2 episodes of vomiting this morning with multiple episodes of nausea and retching.  Objective: Vitals:   05/19/20 1200 05/19/20 1212 05/19/20 1414 05/19/20 1800  BP:  (!) 156/87 (!) 148/85 (!) 154/92  Pulse: (!) 114 (!) 112 (!) 114 (!) 113  Resp: (!) 24 (!) 24  20  Temp: 99.5 F (37.5 C) 99.5 F (37.5 C) 98.9 F (37.2 C) 99.9 F (37.7 C)  TempSrc: Axillary Oral Oral Oral  SpO2: 92% 92% 93% 93%  Weight:      Height:        Intake/Output Summary (Last 24 hours) at 05/19/2020 2014 Last data filed at 05/19/2020 0600 Gross per 24 hour  Intake 1327.57 ml  Output --  Net 1327.57 ml  Filed Weights   05/18/20 1425  Weight: 85 kg    Gen: 69 y.o. female in moderate distress  Pulm: Non-labored breathing room air. Clear to auscultation bilaterally.  CV: Regular rate and rhythm. No murmur, rub, or gallop. No JVD, no pedal edema. GI: Abdomen soft, minimally diffusely tender, non-distended, with normoactive bowel sounds. No organomegaly or masses felt. Ext: Warm, no deformities Skin: No rashes, lesions no ulcers Neuro: Alert and incompletely oriented. Left UE > LE hemiparesis is stable, no  new focal neurological deficits. Psych: Judgement and insight appear impaired. More interactive than yesterday. Mood & affect appropriate.   Data Reviewed: I have personally reviewed following labs and imaging studies  CBC: Recent Labs  Lab 05/17/20 1339 05/18/20 0418 05/19/20 1010  WBC 8.7 11.0* 10.5  NEUTROABS 7.6  --   --   HGB 15.0 12.9 12.5  HCT 45.5 39.4 37.4  MCV 100.0 99.5 99.2  PLT 228 193 186   Basic Metabolic Panel: Recent Labs  Lab 05/17/20 1339 05/18/20 0418 05/18/20 1631 05/19/20 1010  NA 137 134*  --  133*  K 4.9 5.5*   5.5* 5.1 4.1  CL 100 99  --  100  CO2 25 24  --  25  GLUCOSE 118* 114*  --  160*  BUN 28* 30*  --  25*  CREATININE 1.19* 1.19*  --  0.61  CALCIUM 10.6* 9.1  --  8.9  MG 2.8*  --   --   --    GFR: Estimated Creatinine Clearance: 73.3 mL/min (by C-G formula based on SCr of 0.61 mg/dL). Liver Function Tests: Recent Labs  Lab 05/17/20 1339  AST 29  ALT 19  ALKPHOS 88  BILITOT 1.0  PROT 6.8  ALBUMIN 3.5   No results for input(s): LIPASE, AMYLASE in the last 168 hours. No results for input(s): AMMONIA in the last 168 hours. Coagulation Profile: No results for input(s): INR, PROTIME in the last 168 hours. Cardiac Enzymes: No results for input(s): CKTOTAL, CKMB, CKMBINDEX, TROPONINI in the last 168 hours. BNP (last 3 results) No results for input(s): PROBNP in the last 8760 hours. HbA1C: No results for input(s): HGBA1C in the last 72 hours. CBG: No results for input(s): GLUCAP in the last 168 hours. Lipid Profile: No results for input(s): CHOL, HDL, LDLCALC, TRIG, CHOLHDL, LDLDIRECT in the last 72 hours. Thyroid Function Tests: No results for input(s): TSH, T4TOTAL, FREET4, T3FREE, THYROIDAB in the last 72 hours. Anemia Panel: No results for input(s): VITAMINB12, FOLATE, FERRITIN, TIBC, IRON, RETICCTPCT in the last 72 hours. Urine analysis: No results found for: COLORURINE, APPEARANCEUR, LABSPEC, PHURINE, GLUCOSEU, HGBUR,  BILIRUBINUR, KETONESUR, PROTEINUR, UROBILINOGEN, NITRITE, LEUKOCYTESUR Recent Results (from the past 240 hour(s))  Gastrointestinal Panel by PCR , Stool     Status: None   Collection Time: 05/17/20  3:22 PM   Specimen: Stool  Result Value Ref Range Status   Campylobacter species NOT DETECTED NOT DETECTED Final   Plesimonas shigelloides NOT DETECTED NOT DETECTED Final   Salmonella species NOT DETECTED NOT DETECTED Final   Yersinia enterocolitica NOT DETECTED NOT DETECTED Final   Vibrio species NOT DETECTED NOT DETECTED Final   Vibrio cholerae NOT DETECTED NOT DETECTED Final   Enteroaggregative E coli (EAEC) NOT DETECTED NOT DETECTED Final   Enteropathogenic E coli (EPEC) NOT DETECTED NOT DETECTED Final   Enterotoxigenic E coli (ETEC) NOT DETECTED NOT DETECTED Final   Shiga like toxin producing E coli (STEC) NOT DETECTED NOT DETECTED Final   Shigella/Enteroinvasive  E coli (EIEC) NOT DETECTED NOT DETECTED Final   Cryptosporidium NOT DETECTED NOT DETECTED Final   Cyclospora cayetanensis NOT DETECTED NOT DETECTED Final   Entamoeba histolytica NOT DETECTED NOT DETECTED Final   Giardia lamblia NOT DETECTED NOT DETECTED Final   Adenovirus F40/41 NOT DETECTED NOT DETECTED Final   Astrovirus NOT DETECTED NOT DETECTED Final   Norovirus GI/GII NOT DETECTED NOT DETECTED Final   Rotavirus A NOT DETECTED NOT DETECTED Final   Sapovirus (I, II, IV, and V) NOT DETECTED NOT DETECTED Final    Comment: Performed at [facility], [address] [place], [place] [address]  C Difficile Quick Screen w PCR reflex     Status: None   Collection Time: 05/17/20  3:22 PM   Specimen: STOOL  Result Value Ref Range Status   C Diff antigen NEGATIVE NEGATIVE Final   C Diff toxin NEGATIVE NEGATIVE Final   C Diff interpretation No C. difficile detected.  Final    Comment: Performed at [facility], [address] [place], [place] [address]  SARS Coronavirus 2 by RT PCR (hospital order,  performed in [facility] hospital lab) Nasopharyngeal Nasopharyngeal Swab     Status: None   Collection Time: 05/17/20  6:52 PM   Specimen: Nasopharyngeal Swab  Result Value Ref Range Status   SARS Coronavirus 2 NEGATIVE NEGATIVE Final    Comment: (NOTE) SARS-CoV-2 target nucleic acids are NOT DETECTED.  The SARS-CoV-2 RNA is generally detectable in upper and lower respiratory specimens during the acute phase of infection. The lowest concentration of SARS-CoV-2 viral copies this assay can detect is 250 copies / mL. A negative result does not preclude SARS-CoV-2 infection and should not be used as the sole basis for treatment or other patient management decisions.  A negative result may occur with improper specimen collection / handling, submission of specimen other than nasopharyngeal swab, presence of viral mutation(s) within the areas targeted by this assay, and inadequate number of viral copies (<250 copies / mL). A negative result must be combined with clinical observations, patient history, and epidemiological information.  Fact Sheet for Patients:   BoilerBrush.com.cy  Fact Sheet for Healthcare Providers: https://pope.com/  This test is not yet approved or  cleared by the [place] FDA and has been authorized for detection and/or diagnosis of SARS-CoV-2 by FDA under an Emergency Use Authorization (EUA).  This EUA will remain in effect (meaning this test can be used) for the duration of the COVID-19 declaration under Section 564(b)(1) of the Act, 21 U.S.C. section 360bbb-3(b)(1), unless the authorization is terminated or revoked sooner.  Performed at [facility], [address], [place], [place] [address]   MRSA PCR Screening     Status: Abnormal   Collection Time: 05/18/20  6:59 AM   Specimen: Nasopharyngeal  Result Value Ref Range Status   MRSA by PCR POSITIVE (A) NEGATIVE Final    Comment:          The GeneXpert MRSA Assay (FDA approved for NASAL specimens only), is one component of a comprehensive MRSA colonization surveillance program. It is not intended to diagnose MRSA infection nor to guide or monitor treatment for MRSA infections. RESULT CALLED TO, READ BACK BY AND VERIFIED WITH: BULLINS,M. RN @1428  05/18/20 BILLINGSLEY,L Performed at [facility], [address], [place], [place]       Radiology Studies: DG CHEST PORT 1 VIEW  Result Date: 05/19/2020 CLINICAL DATA:  Nasogastric tube placement. EXAM: PORTABLE  CHEST 1 VIEW COMPARISON:  Same day. FINDINGS: The heart size and mediastinal contours are within normal limits. Stable bibasilar atelectasis or infiltrates are noted. Nasogastric tube tip is seen in the expected position of the proximal stomach. The visualized skeletal structures are unremarkable. IMPRESSION: Nasogastric tube tip seen in expected position of the proximal stomach. Electronically Signed   By: Marijo Conception M.D.   On: 05/19/2020 14:11   DG CHEST PORT 1 VIEW  Result Date: 05/19/2020 CLINICAL DATA:  Cough and shortness of breath EXAM: PORTABLE CHEST 1 VIEW COMPARISON:  February 06, 2013 FINDINGS: There is a shallow degree of inspiration. There is ill-defined airspace opacity in the lung bases, concerning for a degree of pneumonia in the bases. Lungs elsewhere clear. Heart size and pulmonary vascular normal. No adenopathy. There is arthropathy in the left shoulder. IMPRESSION: Shallow degree of inspiration with ill-defined airspace opacity concerning for pneumonia in the bases. The degree of shallow inspiration raises concern for a degree of underlying restrictive disease. Lungs elsewhere clear.  Cardiac silhouette normal.  No adenopathy. Electronically Signed   By: Lowella Grip III M.D.   On: 05/19/2020 12:00   DG Abd Portable 1V  Result Date: 05/19/2020 CLINICAL DATA:  Diarrhea. EXAM: PORTABLE ABDOMEN - 1 VIEW COMPARISON:  None.  FINDINGS: Stomach is nondistended. There are multiple dilated loops of small bowel identified within the left abdomen measuring up to 3.1 cm. A large stool burden is noted involving the right colon. IMPRESSION: 1. Dilated loops of small bowel within the left abdomen compatible which may reflect partial small bowel obstruction or small-bowel ileus. 2. Large stool burden within the right colon. Electronically Signed   By: Kerby Moors M.D.   On: 05/19/2020 11:41    Scheduled Meds:  budesonide (PULMICORT) nebulizer solution  0.25 mg Nebulization BID   carbamazepine  300 mg Oral BID   Chlorhexidine Gluconate Cloth  6 each Topical Q0600   gabapentin  400 mg Oral TID   magic mouthwash  5 mL Oral QID   mupirocin ointment  1 application Nasal BID   pantoprazole (PROTONIX) IV  40 mg Intravenous QHS   sodium chloride flush  3 mL Intravenous Q12H   Continuous Infusions:  sodium chloride 75 mL/hr at 05/19/20 0919   levETIRAcetam       LOS: 1 day   Time spent: 25 minutes.  Berle Mull, MD [facility] [email or web address] 05/19/2020, 8:14 PM

## 2020-05-20 ENCOUNTER — Inpatient Hospital Stay (HOSPITAL_COMMUNITY): Payer: Medicare Other

## 2020-05-20 LAB — BASIC METABOLIC PANEL
Anion gap: 7 (ref 5–15)
BUN: 25 mg/dL — ABNORMAL HIGH (ref 8–23)
CO2: 27 mmol/L (ref 22–32)
Calcium: 8.3 mg/dL — ABNORMAL LOW (ref 8.9–10.3)
Chloride: 101 mmol/L (ref 98–111)
Creatinine, Ser: 0.6 mg/dL (ref 0.44–1.00)
GFR calc Af Amer: >60 mL/min (ref >60)
GFR calc non Af Amer: >60 mL/min (ref >60)
Glucose, Bld: 124 mg/dL — ABNORMAL HIGH (ref 70–99)
Potassium: 3.7 mmol/L (ref 3.5–5.1)
Sodium: 135 mmol/L (ref 135–145)

## 2020-05-20 LAB — MAGNESIUM: Magnesium: 1.8 mg/dL (ref 1.7–2.4)

## 2020-05-20 MED ORDER — BISACODYL 10 MG RE SUPP
10.0000 mg | Freq: Every day | RECTAL | Status: AC
  Administered 2020-05-20 – 2020-05-22 (×3): 10 mg via RECTAL
  Filled 2020-05-20 (×3): qty 1

## 2020-05-20 MED ORDER — PHENOL 1.4 % MT LIQD: 1.0000 | OROMUCOSAL | Status: DC | PRN

## 2020-05-20 MED ORDER — DIATRIZOATE MEGLUMINE & SODIUM 66-10 % PO SOLN
90.0000 mL | Freq: Once | ORAL | Status: AC
  Administered 2020-05-20: 90 mL via NASOGASTRIC
  Filled 2020-05-20: qty 90

## 2020-05-20 NOTE — Progress Notes (Signed)
Patient's NG tube reconnected to low-intermittent suction. Small-moderate drainage returned, although patient tolerated clamping for one hour without nausea or vomiting.Melton Alar, RN

## 2020-05-20 NOTE — TOC Initial Note (Addendum)
Transition of Care [facility]) - Initial/Assessment Note    Patient Details  Name: Nicole [name] MRN: [id number] Date of Birth: 1951-10-29  Transition of Care [facility]) CM/SW Contact:    Darleene Cleaver, LCSW Phone Number: 05/20/2020, 7:29 PM  Clinical Narrative:                  Patient is a 69 year old female who is from [place] [facility].  Patient has been there for several years, per group home administrator April Evans, [phone], they have been providing moderate assistance with patient.  She also has caps program in place which per DSS case manager Yvone Neu [phone] said expires July 1st and will have to be renewed or else patient will be dropped from the caps program.  Per Sherrilyn Rist, if patient is in a SNF past June 30th she may not be able to continue with caps program which is providing extra help in the group home for patient.  Sherrilyn Rist informed CSW that if patient does go to a SNF she will have to be discharged back to group home before June 30th in order to continue with caps, she requested to make sure SNF is aware.  Patient's [facility] number is Pending, updated clinicals have been sent.  CSW spoke to April Evans group home administrator, she said that if patient's family choose to have her return to the group home with home health, they can take her back and will make sure she is receiving the help she needs.  Per April, they are able to assist patient with higher needs of care if needed.  CSW to continue to follow patient's progress throughout discharge planning.   Expected Discharge Plan: Skilled Nursing Facility Barriers to Discharge: Continued Medical Work up   Patient Goals and CMS Choice Patient states their goals for this hospitalization and ongoing recovery are:: To go to SNF or back to her group home. CMS Medicare.gov Compare Post Acute Care list provided to:: Patient Represenative (must comment) Choice offered to / list presented to : [facility] POA / Guardian  Expected  Discharge Plan and Services Expected Discharge Plan: Skilled Nursing Facility In-house Referral: Clinical Social Work   Post Acute Care Choice: Home Health, Skilled Nursing Facility Living arrangements for the past 2 months: Group Home ([facility])                   DME Agency: NA       HH Arranged: NA          Prior Living Arrangements/Services Living arrangements for the past 2 months: Group Home ([facility]) Lives with:: Facility Resident Patient language and need for interpreter reviewed:: Yes Do you feel safe going back to the place where you live?: No   Patient needs some short term rehab before returning back home.  Need for Family Participation in Patient Care: Yes (Comment) Care giver support system in place?: Yes (comment)   Criminal Activity/Legal Involvement Pertinent to Current Situation/Hospitalization: No - Comment as needed  Activities of Daily Living Home Assistive Devices/Equipment: Eyeglasses, Wheelchair, Environmental consultant (specify type), Other (Comment) (has several assistive devices that she can't use any more per her sister) ADL Screening (condition at time of admission) Patient's cognitive ability adequate to safely complete daily activities?: No Is the patient deaf or have difficulty hearing?: Yes (trouble hearing out of left ear) Does the patient have difficulty seeing, even when wearing glasses/contacts?: 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)  Does the patient have difficulty concentrating, remembering, or making decisions?: Yes ("mentality of 69 year old") Patient able to express need for assistance with ADLs?: Yes Does the patient have difficulty dressing or bathing?: Yes Independently performs ADLs?: No Communication: Independent Dressing (OT): Needs assistance Is this a change from baseline?: Pre-admission baseline Grooming: Needs assistance Is this a change from baseline?: Pre-admission baseline Feeding: Needs assistance Is this a change from baseline?:  Pre-admission baseline Bathing: Needs assistance Is this a change from baseline?: Pre-admission baseline Toileting: Needs assistance (pt wears a diaper starting today per sister) Is this a change from baseline?: Pre-admission baseline In/Out Bed: Needs assistance Is this a change from baseline?: Pre-admission baseline Walks in Home: Dependent Is this a change from baseline?: Pre-admission baseline Does the patient have difficulty walking or climbing stairs?: Yes Weakness of Legs: Both Weakness of Arms/Hands: Both  Permission Sought/Granted Permission sought to share information with : Facility Sport and exercise psychologist, Family Supports, Guardian, Other (comment) (DSS case manager Steele Sizer and group home administrator April Evans) Permission granted to share information with : Yes, Verbal Permission Granted  Share Information with NAME: Bray,[name] Legal Guardian [place] DSS case manager [phone], and April Evans [phone] group home administrator.  Permission granted to share info w AGENCY: SNF and home health agencies.        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 Altered mental status 05/17/2020  . Hyperlipidemia   . Hypertension   . Hypotension   . Seizures ([place])   . AKI (acute kidney injury) ([place])   . Dehydration   . Diarrhea   . Oral thrush 02/28/2012  . ALOPECIA 01/04/2010  . Allergic rhinitis 07/17/2008  . PULMONARY NODULE 07/17/2008  . ENLARGEMENT OF LYMPH NODES 07/17/2008  . Cough 07/17/2008   PCP:  Vicenta Aly, [place] [facility]acy:    [place], [place] [address] Phone: [phone] Fax: [phone]     Social Determinants of Health (SDOH) Interventions    Readmission Risk Interventions No flowsheet data found.

## 2020-05-20 NOTE — NC FL2 (Signed)
[place] LEVEL OF CARE SCREENING TOOL     IDENTIFICATION  Patient Name: Nicole [name] Birthdate: October 19, 1951 Sex: female Admission Date (Current Location): 05/17/2020  [place] and [place] Number:  Kathleen Argue [id number] [place] and Address:  [facility],  [place], [place]      Provider Number: [id number]  Attending Physician Name and Address:  Lavina Hamman, MD  Relative Name and Phone Number:  Debbra Riding Legal Guardian [phone]    Current Level of Care: Hospital Recommended Level of Care: [place] Prior Approval Number:    Date Approved/Denied:   PASRR Number: Pending  Discharge Plan: SNF    Current Diagnoses: Patient Active Problem List   Diagnosis Date Noted  . Altered mental status 05/17/2020  . Hyperlipidemia   . Hypertension   . Hypotension   . Seizures ([place])   . AKI (acute kidney injury) ([place])   . Dehydration   . Diarrhea   . Oral thrush 02/28/2012  . ALOPECIA 01/04/2010  . Allergic rhinitis 07/17/2008  . PULMONARY NODULE 07/17/2008  . ENLARGEMENT OF LYMPH NODES 07/17/2008  . Cough 07/17/2008    Orientation RESPIRATION BLADDER Height & Weight     Self, Time, Situation, Place  Normal Continent Weight: 187 lb 6.3 oz (85 kg) Height:  5\' 7"  (170.2 cm)  BEHAVIORAL SYMPTOMS/MOOD NEUROLOGICAL BOWEL NUTRITION STATUS    Convulsions/Seizures Continent Diet, NG/panda  AMBULATORY STATUS COMMUNICATION OF NEEDS Skin   Limited Assist Verbally Normal                       Personal Care Assistance Level of Assistance  Bathing, Dressing, Feeding   Feeding assistance: Limited assistance Dressing Assistance: Limited assistance     Functional Limitations Info  Sight, Speech, Hearing Sight Info: Adequate Hearing Info: Adequate Speech Info: Adequate    SPECIAL CARE FACTORS FREQUENCY  PT (By licensed PT), OT (By licensed OT)     PT Frequency: Minimum 5x a week OT Frequency: Minimum 5x a  week            Contractures Contractures Info: Not present    Additional Factors Info  Code Status, Allergies, Isolation Precautions Code Status Info: DNR Allergies Info: Seasonal Ic     Isolation Precautions Info: MRSA     Current Medications (05/20/2020):  This is the current hospital active medication list Current Facility-Administered Medications  Medication Dose Route Frequency Provider Last Rate Last Admin  . 0.9 %  sodium chloride infusion   Intravenous Continuous Lavina Hamman, MD 75 mL/hr at 05/20/20 1625 New Bag at 05/20/20 1625  . acetaminophen (TYLENOL) tablet 650 mg  650 mg Oral Q6H PRN Patrecia Pour, MD   650 mg at 05/17/20 1942   Or  . acetaminophen (TYLENOL) suppository 650 mg  650 mg Rectal Q6H PRN Patrecia Pour, MD      . albuterol (PROVENTIL) (2.5 MG/3ML) 0.083% nebulizer solution 2.5 mg  2.5 mg Nebulization Q6H PRN Lavina Hamman, MD      . bisacodyl (DULCOLAX) suppository 10 mg  10 mg Rectal Daily Lavina Hamman, MD      . budesonide (PULMICORT) nebulizer solution 0.25 mg  0.25 mg Nebulization BID Vance Gather B, MD   0.25 mg at 05/18/20 2025  . carbamazepine (TEGRETOL XR) 12 hr tablet 300 mg  300 mg Oral BID Patrecia Pour, MD   300 mg at 05/18/20 2145  . Chlorhexidine Gluconate Cloth 2 %  PADS 6 each  6 each Topical Q0600 Tyrone Nine, MD   6 each at 05/20/20 0454  . diatrizoate meglumine-sodium (GASTROGRAFIN) 66-10 % solution 90 mL  90 mL Per NG tube Once Rolly Salter, MD      . gabapentin (NEURONTIN) capsule 400 mg  400 mg Oral TID Rolly Salter, MD      . levETIRAcetam (KEPPRA) IVPB 500 mg/100 mL premix  500 mg Intravenous Q12H Rolly Salter, MD   Stopped at 05/20/20 [phone]  . liver oil-zinc oxide (DESITIN) 40 % ointment   Topical BID PRN Tyrone Nine, MD      . magic mouthwash  5 mL Oral QID Rolly Salter, MD      . mupirocin ointment (BACTROBAN) 2 % 1 application  1 application Nasal BID Tyrone Nine, MD   1 application at 05/20/20 1612  .  ondansetron (ZOFRAN) tablet 4 mg  4 mg Oral Q6H PRN Tyrone Nine, MD       Or  . ondansetron [facility]) injection 4 mg  4 mg Intravenous Q6H PRN Tyrone Nine, MD   4 mg at 05/19/20 1830  . pantoprazole (PROTONIX) injection 40 mg  40 mg Intravenous QHS Rolly Salter, MD   40 mg at 05/19/20 2203  . phenol (CHLORASEPTIC) mouth spray 1 spray  1 spray Mouth/Throat PRN Rolly Salter, MD      . sodium chloride flush (NS) 0.9 % injection 3 mL  3 mL Intravenous Q12H Tyrone Nine, MD   3 mL at 05/19/20 2208     Discharge Medications: Please see discharge summary for a list of discharge medications.  Relevant Imaging Results:  Relevant Lab Results:   Additional Information SSN [id number]  Darleene Cleaver, LCSW

## 2020-05-20 NOTE — Progress Notes (Signed)
[facility] Progress Note  Patient: Nicole [name]    OHY:[id number]  DOA: 05/17/2020     Date of Service: the patient was seen and examined on 05/20/2020  Chief Complaint  Patient presents with  . Diarrhea  . 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 She was afebrile and hypotensive, improved with fluids. SCr above baseline at 1.19, BUN 28. The patient remained off her mental baseline per her sister, more drowsy, less talkative, has had >20 episodes of watery incontinent diarrhea. Some bloody stools now reported and diarrhea continues. CDiff negative.  Currently plan is pain control and resolution of small bowel obstruction.  Assessment and Plan: 1.  Small bowel obstruction Intractable nausea and vomiting Diarrhea Initially presented with diarrhea. Work-up is so far negative for any infectious etiology. Abdominal examination was reassuring. Patient was given oral Imodium. On 05/19/2020 patient was found to have a small bowel obstruction. X-ray shows evidence of constipation actually. NG tube was inserted. Currently NG tube output is roughly around a liter for a day. We will continue with small bowel protocol. Continue with NG tube with low intermittent suction for now. Chloraseptic spray. Monitor x-ray.  2.  Acute encephalopathy on prior history of anoxic brain injury Currently improving. Continue supportive care. Continuing home medication as much as we can given restrictions with the small bowel obstruction  3.  Acute kidney injury Hyperkalemia Renal function improving. Potassium improving. Monitor. Hold losartan. Treated with Lokelma.  4.  Essential hypertension. Hypotension. Syncope Currently blood pressure  stable. Holding all antihypertensive medications. Monitor.  5.  Seizure disorder Continue Tegretol and Keppra  6.  Hyperlipidemia Holding statin since the patient is n.p.o.  Diet: N.p.o. except medications DVT Prophylaxis: Subcutaneous Heparin    Advance goals of care discussion: DNR  Family Communication: family was present at bedside, at the time of interview.  The pt provided permission to discuss medical plan with the family. Opportunity was given to ask question and all questions were answered satisfactorily.   Disposition:  Status is: Inpatient  Remains inpatient appropriate because:IV treatments appropriate due to intensity of illness or inability to take PO   Dispo: The patient is from: Home              Anticipated d/c is to: Home              Anticipated d/c date is: 3 days              Patient currently is not medically stable to d/c.        Subjective: Continues to have significant output from the NG tube. Not passing gas having bowel movements.  No fever no chills.  No chest pain no abdominal pain right now.  Throat is hurting due to NG tube and patient is verbally abusive for that.  Physical Exam:  General: Appear in moderate distress, no Rash; Oral Mucosa Clear, moist. no Abnormal Neck Mass Or lumps, Conjunctiva normal  Cardiovascular: S1 and S2 Present, no Murmur, Respiratory: good respiratory effort, Bilateral Air entry present and Clear to Auscultation, no Crackles, no wheezes Abdomen: Bowel Sound present, Soft and no tenderness Extremities: no Pedal edema, no calf tenderness Neurology: alert and oriented to time, place, and person affect appropriate. no new focal deficit Gait not checked due  to patient safety concerns  Vitals:   05/19/20 2035 05/20/20 0049 05/20/20 0517 05/20/20 1255  BP: (!) 142/79 (!) 148/81 (!) 145/82 130/73  Pulse: (!) 101 97 95 93  Resp: 18 16 18  (!) 21  Temp: 99.8 F (37.7 C) 99.8 F (37.7 C) 98.6 F (37 C) 99.6 F  (37.6 C)  TempSrc: Oral Oral Oral Oral  SpO2: 97% 97% 97% 92%  Weight:      Height:        Intake/Output Summary (Last 24 hours) at 05/20/2020 1940 Last data filed at 05/20/2020 1632 Gross per 24 hour  Intake 1677.42 ml  Output 1300 ml  Net 377.42 ml   Filed Weights   05/18/20 1425  Weight: 85 kg    Data Reviewed: I have personally reviewed and interpreted daily labs, tele strips, imagings as discussed above. I reviewed all nursing notes, pharmacy notes, vitals, pertinent old records I have discussed plan of care as described above with RN and patient/family.  CBC: Recent Labs  Lab 05/17/20 1339 05/18/20 0418 05/19/20 1010  WBC 8.7 11.0* 10.5  NEUTROABS 7.6  --   --   HGB 15.0 12.9 12.5  HCT 45.5 39.4 37.4  MCV 100.0 99.5 99.2  PLT 228 193 427   Basic Metabolic Panel: Recent Labs  Lab 05/17/20 1339 05/18/20 0418 05/18/20 1631 05/19/20 1010 05/20/20 0802  NA 137 134*  --  133* 135  K 4.9 5.5*  5.5* 5.1 4.1 3.7  CL 100 99  --  100 101  CO2 25 24  --  25 27  GLUCOSE 118* 114*  --  160* 124*  BUN 28* 30*  --  25* 25*  CREATININE 1.19* 1.19*  --  0.61 0.60  CALCIUM 10.6* 9.1  --  8.9 8.3*  MG 2.8*  --   --   --  1.8    Studies: DG Abd Portable 1V  Result Date: 05/20/2020 CLINICAL DATA:  Abdominal distension several days. EXAM: PORTABLE ABDOMEN - 1 VIEW COMPARISON:  05/19/2020 FINDINGS: Interval improvement and bowel gas pattern with no significantly dilated small bowel loops on the current exam. No free peritoneal air. Air is present throughout the colon. Mild fecal retention over the rectum. Nasogastric tube has tip over the stomach in the left upper quadrant. Remainder of the exam is unchanged. IMPRESSION: Interval improvement with no significant dilated small bowel loops on the current exam. Nasogastric tube with tip over the stomach in the left upper quadrant. Electronically Signed   By: Marin Olp M.D.   On: 05/20/2020 10:27    Scheduled Meds: .  bisacodyl  10 mg Rectal Daily  . budesonide (PULMICORT) nebulizer solution  0.25 mg Nebulization BID  . carbamazepine  300 mg Oral BID  . Chlorhexidine Gluconate Cloth  6 each Topical Q0600  . gabapentin  400 mg Oral TID  . magic mouthwash  5 mL Oral QID  . mupirocin ointment  1 application Nasal BID  . pantoprazole (PROTONIX) IV  40 mg Intravenous QHS  . sodium chloride flush  3 mL Intravenous Q12H   Continuous Infusions: . sodium chloride 75 mL/hr at 05/20/20 1625  . levETIRAcetam 500 mg (05/20/20 1830)   PRN Meds: acetaminophen **OR** acetaminophen, albuterol, liver oil-zinc oxide, ondansetron **OR** ondansetron (ZOFRAN) IV, phenol  Time spent: 35 minutes  Author: Berle Mull, MD [facility] 05/20/2020 7:40 PM  To reach On-call, see care teams to locate the attending and reach out via [email or web address]. Between 7PM-7AM, please contact night-coverage  If you still have difficulty reaching the attending provider, please page the [facility] (Director on Call) for [facility] on amion for assistance.

## 2020-05-20 NOTE — Progress Notes (Addendum)
This note also relates to the following rows which could not be included: ECG Heart Rate - Cannot attach notes to unvalidated device data  Patient has been vomiting several times this am. Md made aware and new orders obtained.

## 2020-05-20 NOTE — Plan of Care (Signed)

## 2020-05-20 NOTE — Clinical Social Work Note (Signed)
30 Day Passar Note  RE: Nicole [name]. [place]    Date of Birth: 06/07/1951    Date: 05-20-2020      To Whom It May Concern:  Please be advised that the above-named patient will require a short-term nursing home stay - anticipated 30 days or less for rehabilitation and strengthening.  The plan is for return home.   Windell Moulding, MSW, [place] [phone]

## 2020-05-20 NOTE — Progress Notes (Signed)
Physical Therapy Treatment Patient Details Name: Nicole [name] MRN: [id number] DOB: Apr 18, 1951 Today's Date: 05/20/2020    History of Present Illness 69 yo female admitted with AMS, diarrhea, hypotension. 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 Pt is from a group home.    PT Comments    Pt perseverating on NG tube placement, continuously tells therapist "I have a tube down my throat" and "they are going to take the tube out tomorrow". Therapist attempts to bed mobility training to work on getting to EOB, but pt only able to achieve partial sitting EOB due to back pain complaints and requesting to return to supine. Pt requires max Ax2 for supine<>sit and to scoot up in bed with constant multimodal cues to improve performance. RN notified of session and limited progression. Patient will benefit from continued physical therapy in hospital and recommendations below to increase strength, balance, endurance for safe ADLs and gait.   Follow Up Recommendations  SNF     Equipment Recommendations  None recommended by PT    Recommendations for Other Services OT consult     Precautions / Restrictions Precautions Precautions: Fall Precaution Comments: NG tube Restrictions Weight Bearing Restrictions: No    Mobility  Bed Mobility Overal bed mobility: Needs Assistance Bed Mobility: Supine to Sit;Sit to Supine  Supine to sit: Max assist;+2 for physical assistance;+2 for safety/equipment Sit to supine: Max assist;+2 for physical assistance;+2 for safety/equipment  General bed mobility comments: max A to bring LLE to EOB, able to slide RLE over to bedside with cues, max A to upright trunk with cues for hand placement and sequencing; pt very resistent, reports back pain when mobilizing requesting to return to supine, only able to achieve partial sitting EOB without feet flat before  returning to supine  Transfers  General transfer comment: not attempted  Ambulation/Gait                 Stairs             Wheelchair Mobility    Modified Rankin (Stroke Patients Only)       Balance Overall balance assessment: Needs assistance   Sitting balance-Leahy Scale: Poor Sitting balance - Comments: mod/max A to maintain partial sitting EOB without feet flat on floor, poor trunk control, requesting to return to supine               Cognition Arousal/Alertness: Awake/alert Behavior During Therapy: WFL for tasks assessed/performed Overall Cognitive Status: History of cognitive impairments - at baseline                   Exercises      General Comments        Pertinent Vitals/Pain Pain Assessment: Faces Faces Pain Scale: Hurts even more Pain Location: "back" Pain Descriptors / Indicators: Grimacing;Guarding;Moaning Pain Intervention(s): Limited activity within patient's tolerance;Monitored during session;Repositioned    Home Living                      Prior Function            PT Goals (current goals can now be found in the care plan section) Acute Rehab PT Goals Patient Stated Goal: To improve strength and functional abilities PT Goal Formulation: With patient/family Time For Goal Achievement: 06/01/20 Potential to Achieve Goals: Fair Progress towards PT goals: Progressing toward goals (limited 2* pain)  Frequency    Min 2X/week      PT Plan Current plan remains appropriate    Co-evaluation              AM-PAC PT "6 Clicks" Mobility   Outcome Measure  Help needed turning from your back to your side while in a flat bed without using bedrails?: Total Help needed moving from lying on your back to sitting on the side of a flat bed without using bedrails?: Total Help needed moving to and from a bed to a chair (including a wheelchair)?: Total Help needed standing up from a chair using your arms (e.g.,  wheelchair or bedside chair)?: Total Help needed to walk in hospital room?: Total Help needed climbing 3-5 steps with a railing? : Total 6 Click Score: 6    End of Session   Activity Tolerance: Patient limited by pain Patient left: in bed;with call bell/phone within reach;with bed alarm set Nurse Communication: Mobility status PT Visit Diagnosis: Muscle weakness (generalized) (M62.81);History of falling (Z91.81)     Time: 0950-1005 PT Time Calculation (min) (ACUTE ONLY): 15 min  Charges:  $Therapeutic Activity: 8-22 mins                      Tori Jaxsun Ciampi PT, DPT 05/20/20, 10:51 AM

## 2020-05-21 ENCOUNTER — Inpatient Hospital Stay (HOSPITAL_COMMUNITY): Payer: Medicare Other

## 2020-05-21 LAB — BASIC METABOLIC PANEL
Anion gap: 10 (ref 5–15)
BUN: 26 mg/dL — ABNORMAL HIGH (ref 8–23)
CO2: 26 mmol/L (ref 22–32)
Calcium: 8.6 mg/dL — ABNORMAL LOW (ref 8.9–10.3)
Chloride: 104 mmol/L (ref 98–111)
Creatinine, Ser: 0.66 mg/dL (ref 0.44–1.00)
GFR calc Af Amer: >60 mL/min (ref >60)
GFR calc non Af Amer: >60 mL/min (ref >60)
Glucose, Bld: 107 mg/dL — ABNORMAL HIGH (ref 70–99)
Potassium: 3.3 mmol/L — ABNORMAL LOW (ref 3.5–5.1)
Sodium: 140 mmol/L (ref 135–145)

## 2020-05-21 LAB — CBC
HCT: 34.5 % — ABNORMAL LOW (ref 36.0–46.0)
Hemoglobin: 11.4 g/dL — ABNORMAL LOW (ref 12.0–15.0)
MCH: 32.9 pg (ref 26.0–34.0)
MCHC: 33 g/dL (ref 30.0–36.0)
MCV: 99.4 fL (ref 80.0–100.0)
Platelets: 218 10*3/uL (ref 150–400)
RBC: 3.47 MIL/uL — ABNORMAL LOW (ref 3.87–5.11)
RDW: 12.8 % (ref 11.5–15.5)
WBC: 5.8 10*3/uL (ref 4.0–10.5)
nRBC: 0 % (ref 0.0–0.2)

## 2020-05-21 LAB — MAGNESIUM: Magnesium: 2.1 mg/dL (ref 1.7–2.4)

## 2020-05-21 MED ORDER — POLYETHYLENE GLYCOL 3350 17 G PO PACK
17.0000 g | PACK | Freq: Every day | ORAL | Status: DC
  Administered 2020-05-21: 17 g via ORAL
  Filled 2020-05-21: qty 1

## 2020-05-21 MED ORDER — DOCUSATE SODIUM 100 MG PO CAPS
100.0000 mg | ORAL_CAPSULE | Freq: Two times a day (BID) | ORAL | Status: DC
  Administered 2020-05-21 – 2020-05-25 (×7): 100 mg via ORAL
  Filled 2020-05-21 (×8): qty 1

## 2020-05-21 MED ORDER — PANTOPRAZOLE SODIUM 40 MG PO TBEC
40.0000 mg | DELAYED_RELEASE_TABLET | Freq: Every day | ORAL | Status: DC
  Administered 2020-05-21 – 2020-05-27 (×7): 40 mg via ORAL
  Filled 2020-05-21 (×7): qty 1

## 2020-05-21 MED ORDER — POTASSIUM CHLORIDE 10 MEQ/100ML IV SOLN
10.0000 meq | INTRAVENOUS | Status: AC
  Administered 2020-05-21 (×6): 10 meq via INTRAVENOUS
  Filled 2020-05-21 (×5): qty 100

## 2020-05-21 NOTE — Progress Notes (Signed)
Patient vomited a small amount of green emesis after giving a bath and drinking ginger ale. She refused nausea medication and stated that she felt better after vomiting. Will continue to monitor.

## 2020-05-21 NOTE — Care Management Important Message (Signed)
Important Message  Patient Details IM Letter given to Ezekiel Ina RN Case Manager to present to the Patient Name: Nicole [name] MRN: [id number] Date of Birth: Mar 16, 1951   Medicare Important Message Given:  Yes     Caren Macadam 05/21/2020, 10:41 AM

## 2020-05-21 NOTE — Progress Notes (Signed)
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 She was afebrile and hypotensive, improved with fluids. SCr above baseline at 1.19, BUN 28. The patient remained off her mental baseline per her sister, more drowsy, less talkative, has had >20 episodes of watery incontinent diarrhea. Some bloody stools now reported and diarrhea continues. CDiff negative.  Currently plan is pain control and resolution of small bowel obstruction.  Assessment and Plan: 1.  Small bowel obstruction Intractable nausea and vomiting Diarrhea Initially presented with diarrhea. Work-up is so far negative for any infectious etiology. Abdominal examination was reassuring. Patient was given oral Imodium. On 05/19/2020 patient was found to have a small bowel obstruction. X-ray shows evidence of constipation actually. NG tube was inserted. Small bowel protocol was initiated as well. X-ray on 05/21/2020 shows a Mayford Knife of significant improvement in bowel obstruction. We will clamp the NG tube and continue with clear liquid diet. Repeat x-ray tomorrow.  2.  Acute encephalopathy on prior history of anoxic brain injury Currently improving. Continue supportive care. Continuing home medication as much as we can given restrictions with the small bowel obstruction  3.  Acute kidney injury Hyperkalemia, now hypokalemic due to SBO Renal function improving. Potassium improving. Monitor. Hold losartan. Treated with Lokelma.  4.  Essential  hypertension. Hypotension. Syncope Currently blood pressure stable. Holding all antihypertensive medications. Monitor.  5.  Seizure disorder Continue Tegretol and Keppra  6.  Hyperlipidemia Holding statin since the patient is n.p.o.  Diet: Clear liquid diet.  Monitor response. DVT Prophylaxis: Subcutaneous Heparin    Advance goals of care discussion: DNR  Family Communication: family was present at bedside, at the time of interview.  The pt provided permission to discuss medical plan with the family. Opportunity was given to ask question and all questions were answered satisfactorily.   Disposition:  Status is: Inpatient  Remains inpatient appropriate because:IV treatments appropriate due to intensity of illness or inability to take PO   Dispo: The patient is from: Home              Anticipated d/c is to: Home              Anticipated d/c date is: 3 days              Patient currently is not medically stable to d/c.        Subjective: No further nausea no vomiting.  Reportedly a [employer] last night.  Currently no abdominal pain.  Concern about the tube.  Physical Exam:  General: Appear in moderate distress, no Rash; Oral Mucosa Clear, moist. no Abnormal Neck Mass Or lumps, Conjunctiva normal  Cardiovascular: S1 and S2 Present, no Murmur, Respiratory: good respiratory effort, Bilateral Air entry present and Clear to Auscultation, no Crackles, no wheezes Abdomen: Bowel Sound present, Soft and no tenderness Extremities: no Pedal edema, no calf tenderness Neurology: alert and oriented to time, place, and person affect appropriate. no new focal deficit Gait not checked due to patient safety concerns  Vitals:   05/20/20 1255 05/20/20 2113 05/21/20 0520  05/21/20 1155  BP: 130/73 (!) 147/89 (!) 154/84 (!) 146/80  Pulse: 93 (!) 105 98 92  Resp: (!) 21 20 16  (!) 22  Temp: 99.6 F (37.6 C) 98.7 F (37.1 C) 98.6 F (37 C) 99.1 F (37.3 C)  TempSrc: Oral Oral Oral Oral   SpO2: 92% 92% 93% 93%  Weight:      Height:        Intake/Output Summary (Last 24 hours) at 05/21/2020 1806 Last data filed at 05/21/2020 0600 Gross per 24 hour  Intake 1818.36 ml  Output --  Net 1818.36 ml   Filed Weights   05/18/20 1425  Weight: 85 kg    Data Reviewed: I have personally reviewed and interpreted daily labs, tele strips, imagings as discussed above. I reviewed all nursing notes, pharmacy notes, vitals, pertinent old records I have discussed plan of care as described above with RN and patient/family.  CBC: Recent Labs  Lab 05/17/20 1339 05/18/20 0418 05/19/20 1010 05/21/20 1137  WBC 8.7 11.0* 10.5 5.8  NEUTROABS 7.6  --   --   --   HGB 15.0 12.9 12.5 11.4*  HCT 45.5 39.4 37.4 34.5*  MCV 100.0 99.5 99.2 99.4  PLT 228 193 186 218   Basic Metabolic Panel: Recent Labs  Lab 05/17/20 1339 05/17/20 1339 05/18/20 0418 05/18/20 1631 05/19/20 1010 05/20/20 0802 05/21/20 1137  NA 137  --  134*  --  133* 135 140  K 4.9   < > 5.5*   5.5* 5.1 4.1 3.7 3.3*  CL 100  --  99  --  100 101 104  CO2 25  --  24  --  25 27 26   GLUCOSE 118*  --  114*  --  160* 124* 107*  BUN 28*  --  30*  --  25* 25* 26*  CREATININE 1.19*  --  1.19*  --  0.61 0.60 0.66  CALCIUM 10.6*  --  9.1  --  8.9 8.3* 8.6*  MG 2.8*  --   --   --   --  1.8 2.1   < > = values in this interval not displayed.    Studies: DG Abd Portable 1V-Small Bowel Obstruction Protocol-initial, 8 hr delay  Result Date: 05/21/2020 CLINICAL DATA:  69 year old female small-bowel obstruction protocol, 8 hours post contrast. EXAM: PORTABLE ABDOMEN - 1 VIEW COMPARISON:  05/20/2020 and earlier. FINDINGS: AP view at 0232 hours. Stable enteric tube in the left upper quadrant. Administered oral contrast is predominantly in large bowel, from the cecum to the descending colon. Gas-filled bowel loop now projecting over the mid lower abdomen is probably redundant sigmoid. Visible small bowel loops appear within normal  limits. IMPRESSION: Administered oral contrast is primarily throughout the large intestine. No convincing small bowel obstruction. Electronically Signed   By: 64 M.D.   On: 05/21/2020 04:38    Scheduled Meds:  bisacodyl  10 mg Rectal Daily   budesonide (PULMICORT) nebulizer solution  0.25 mg Nebulization BID   carbamazepine  300 mg Oral BID   Chlorhexidine Gluconate Cloth  6 each Topical Q0600   docusate sodium  100 mg Oral BID   gabapentin  400 mg Oral TID   magic mouthwash  5 mL Oral QID   mupirocin ointment  1 application Nasal BID   pantoprazole  40 mg Oral Daily   polyethylene glycol  17 g Oral Daily   sodium chloride flush  3 mL Intravenous Q12H   Continuous Infusions:  sodium  chloride Stopped (05/21/20 0548)   levETIRAcetam 500 mg (05/21/20 1716)   potassium chloride 10 mEq (05/21/20 1716)   PRN Meds: acetaminophen **OR** acetaminophen, albuterol, liver oil-zinc oxide, ondansetron **OR** ondansetron (ZOFRAN) IV, phenol  Time spent: 35 minutes  Author: Berle Mull, MD [facility] 05/21/2020 6:06 PM  To reach On-call, see care teams to locate the attending and reach out via [email or web address]. Between 7PM-7AM, please contact night-coverage If you still have difficulty reaching the attending provider, please page the [facility] (Director on Call) for [facility] on amion for assistance.

## 2020-05-21 NOTE — Plan of Care (Signed)

## 2020-05-22 LAB — BASIC METABOLIC PANEL
Anion gap: 8 (ref 5–15)
BUN: 22 mg/dL (ref 8–23)
CO2: 25 mmol/L (ref 22–32)
Calcium: 8.4 mg/dL — ABNORMAL LOW (ref 8.9–10.3)
Chloride: 106 mmol/L (ref 98–111)
Creatinine, Ser: 0.51 mg/dL (ref 0.44–1.00)
GFR calc Af Amer: >60 mL/min (ref >60)
GFR calc non Af Amer: >60 mL/min (ref >60)
Glucose, Bld: 115 mg/dL — ABNORMAL HIGH (ref 70–99)
Potassium: 3.2 mmol/L — ABNORMAL LOW (ref 3.5–5.1)
Sodium: 139 mmol/L (ref 135–145)

## 2020-05-22 LAB — CBC
HCT: 31.5 % — ABNORMAL LOW (ref 36.0–46.0)
Hemoglobin: 10.5 g/dL — ABNORMAL LOW (ref 12.0–15.0)
MCH: 32.9 pg (ref 26.0–34.0)
MCHC: 33.3 g/dL (ref 30.0–36.0)
MCV: 98.7 fL (ref 80.0–100.0)
Platelets: 239 10*3/uL (ref 150–400)
RBC: 3.19 MIL/uL — ABNORMAL LOW (ref 3.87–5.11)
RDW: 12.8 % (ref 11.5–15.5)
WBC: 5.7 10*3/uL (ref 4.0–10.5)
nRBC: 0 % (ref 0.0–0.2)

## 2020-05-22 LAB — MAGNESIUM: Magnesium: 1.9 mg/dL (ref 1.7–2.4)

## 2020-05-22 MED ORDER — POTASSIUM CHLORIDE 10 MEQ/100ML IV SOLN
10.0000 meq | INTRAVENOUS | Status: AC
  Administered 2020-05-22 (×3): 10 meq via INTRAVENOUS
  Filled 2020-05-22 (×3): qty 100

## 2020-05-22 NOTE — Plan of Care (Signed)
  Problem: Skin Integrity: Goal: Risk for impaired skin integrity will decrease Outcome: Progressing   

## 2020-05-22 NOTE — Progress Notes (Signed)
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 She was afebrile and hypotensive, improved with fluids. SCr above baseline at 1.19, BUN 28. The patient remained off her mental baseline per her sister, more drowsy, less talkative, has had >20 episodes of watery incontinent diarrhea. Some bloody stools now reported and diarrhea continues. CDiff negative.  Currently plan is pain control and resolution of small bowel obstruction.  Assessment and Plan: 1.  Small bowel obstruction Intractable nausea and vomiting Diarrhea Initially presented with diarrhea. Work-up is so far negative for any infectious etiology. Abdominal examination was reassuring. Patient was given oral Imodium. On 05/19/2020 patient was found to have a small bowel obstruction. X-ray shows evidence of constipation actually. NG tube was inserted. Small bowel protocol was initiated as well. X-ray on 05/21/2020 shows a significant improvement in bowel obstruction. Tolerating clear liquid without any significant vomiting. Overnight vomiting per RN was reportedly small. Had a bowel movement as well. We we will continue clear liquid diet and discontinue NG tube and monitor.  2.  Acute encephalopathy on prior history of anoxic brain injury Currently improving. Continue supportive care. Continuing home medication as much as we can given restrictions with the small bowel obstruction  3.  Acute kidney injury Hyperkalemia, now hypokalemic due to SBO Renal function  improving. Potassium improving. Monitor. Hold losartan. Treated with Lokelma.  4.  Essential hypertension. Hypotension. Syncope Currently blood pressure stable. Holding all antihypertensive medications. Monitor.  5.  Seizure disorder Continue Tegretol and Keppra  6.  Hyperlipidemia Holding statin since the patient is n.p.o.  Diet: Clear liquid diet.  Monitor response. DVT Prophylaxis: Subcutaneous Heparin    Advance goals of care discussion: DNR  Family Communication: No family was present at bedside, at the time of interview.   Disposition:  Status is: Inpatient  Remains inpatient appropriate because:IV treatments appropriate due to intensity of illness or inability to take PO   Dispo: The patient is from: Home              Anticipated d/c is to: Home              Anticipated d/c date is: 3 days              Patient currently is not medically stable to d/c.    Subjective: No vomiting during the day.  One episode of small vomit last night.  No abdominal pain after eating.  No fever no chills.  Had a bowel movement as well.  Physical Exam:  General: Appear in mild distress, no Rash; Oral Mucosa Clear, moist. no Abnormal Neck Mass Or lumps, Conjunctiva normal  Cardiovascular: S1 and S2 Present, no Murmur, Respiratory: good respiratory effort, Bilateral Air entry present and Clear to Auscultation, no Crackles, no wheezes Abdomen: Bowel Sound present, Soft and no tenderness Extremities: no Pedal edema, no calf tenderness Neurology: alert and oriented to time, place, and person affect appropriate. no new focal deficit Gait not checked due to patient safety concerns  Vitals:   05/21/20 2251 05/22/20 0503 05/22/20 0505 05/22/20  1317  BP: (!) 142/76 136/79  (!) 155/89  Pulse: 94 92  95  Resp: 18 18  14   Temp: 99.9 F (37.7 C) 100.1 F (37.8 C)  99.8 F (37.7 C)  TempSrc: Oral Oral  Oral  SpO2: (!) 89% (!) 89% 90% 91%  Weight:      Height:        Intake/Output  Summary (Last 24 hours) at 05/22/2020 1925 Last data filed at 05/22/2020 1654 Gross per 24 hour  Intake 360 ml  Output 400 ml  Net -40 ml   Filed Weights   05/18/20 1425  Weight: 85 kg    Data Reviewed: I have personally reviewed and interpreted daily labs, tele strips, imagings as discussed above. I reviewed all nursing notes, pharmacy notes, vitals, pertinent old records I have discussed plan of care as described above with RN and patient/family.  CBC: Recent Labs  Lab 05/17/20 1339 05/18/20 0418 05/19/20 1010 05/21/20 1137 05/22/20 0507  WBC 8.7 11.0* 10.5 5.8 5.7  NEUTROABS 7.6  --   --   --   --   HGB 15.0 12.9 12.5 11.4* 10.5*  HCT 45.5 39.4 37.4 34.5* 31.5*  MCV 100.0 99.5 99.2 99.4 98.7  PLT 228 193 186 218 239   Basic Metabolic Panel: Recent Labs  Lab 05/17/20 1339 05/17/20 1339 05/18/20 0418 05/18/20 0418 05/18/20 1631 05/19/20 1010 05/20/20 0802 05/21/20 1137 05/22/20 0507  NA 137   < > 134*  --   --  133* 135 140 139  K 4.9   < > 5.5*   5.5*   < > 5.1 4.1 3.7 3.3* 3.2*  CL 100   < > 99  --   --  100 101 104 106  CO2 25   < > 24  --   --  25 27 26 25   GLUCOSE 118*   < > 114*  --   --  160* 124* 107* 115*  BUN 28*   < > 30*  --   --  25* 25* 26* 22  CREATININE 1.19*   < > 1.19*  --   --  0.61 0.60 0.66 0.51  CALCIUM 10.6*   < > 9.1  --   --  8.9 8.3* 8.6* 8.4*  MG 2.8*  --   --   --   --   --  1.8 2.1 1.9   < > = values in this interval not displayed.    Studies: No results found.  Scheduled Meds:  budesonide (PULMICORT) nebulizer solution  0.25 mg Nebulization BID   carbamazepine  300 mg Oral BID   Chlorhexidine Gluconate Cloth  6 each Topical Q0600   docusate sodium  100 mg Oral BID   gabapentin  400 mg Oral TID   magic mouthwash  5 mL Oral QID   mupirocin ointment  1 application Nasal BID   pantoprazole  40 mg Oral Daily   sodium chloride flush  3 mL Intravenous Q12H   Continuous Infusions:  sodium chloride 75 mL/hr at  05/22/20 1912   levETIRAcetam 500 mg (05/22/20 1917)   PRN Meds: acetaminophen **OR** acetaminophen, albuterol, liver oil-zinc oxide, ondansetron **OR** ondansetron (ZOFRAN) IV, phenol  Time spent: 35 minutes  Author: 05/24/20, MD [facility] 05/22/2020 7:25 PM  To reach On-call, see care teams to locate the attending and reach out via [email or web address]. Between 7PM-7AM, please contact night-coverage If you still have difficulty reaching the attending provider, please page the [facility] (Director  on Call) for [facility] on amion for assistance.

## 2020-05-23 LAB — BASIC METABOLIC PANEL
Anion gap: 13 (ref 5–15)
BUN: 18 mg/dL (ref 8–23)
CO2: 24 mmol/L (ref 22–32)
Calcium: 8.6 mg/dL — ABNORMAL LOW (ref 8.9–10.3)
Chloride: 104 mmol/L (ref 98–111)
Creatinine, Ser: 0.51 mg/dL (ref 0.44–1.00)
GFR calc Af Amer: >60 mL/min (ref >60)
GFR calc non Af Amer: >60 mL/min (ref >60)
Glucose, Bld: 114 mg/dL — ABNORMAL HIGH (ref 70–99)
Potassium: 3.1 mmol/L — ABNORMAL LOW (ref 3.5–5.1)
Sodium: 141 mmol/L (ref 135–145)

## 2020-05-23 LAB — CBC
HCT: 31.2 % — ABNORMAL LOW (ref 36.0–46.0)
Hemoglobin: 10.2 g/dL — ABNORMAL LOW (ref 12.0–15.0)
MCH: 32.8 pg (ref 26.0–34.0)
MCHC: 32.7 g/dL (ref 30.0–36.0)
MCV: 100.3 fL — ABNORMAL HIGH (ref 80.0–100.0)
Platelets: 266 10*3/uL (ref 150–400)
RBC: 3.11 MIL/uL — ABNORMAL LOW (ref 3.87–5.11)
RDW: 12.9 % (ref 11.5–15.5)
WBC: 8.7 10*3/uL (ref 4.0–10.5)
nRBC: 0 % (ref 0.0–0.2)

## 2020-05-23 LAB — MAGNESIUM: Magnesium: 2 mg/dL (ref 1.7–2.4)

## 2020-05-23 MED ORDER — POTASSIUM CHLORIDE CRYS ER 20 MEQ PO TBCR
40.0000 meq | EXTENDED_RELEASE_TABLET | ORAL | Status: AC
  Administered 2020-05-23 (×2): 40 meq via ORAL
  Filled 2020-05-23: qty 2

## 2020-05-23 MED ORDER — POLYETHYLENE GLYCOL 3350 17 G PO PACK
17.0000 g | PACK | Freq: Every day | ORAL | Status: DC
  Filled 2020-05-23 (×2): qty 1

## 2020-05-23 MED ORDER — LEVETIRACETAM 500 MG PO TABS
500.0000 mg | ORAL_TABLET | Freq: Two times a day (BID) | ORAL | Status: DC
  Administered 2020-05-23 – 2020-05-27 (×8): 500 mg via ORAL
  Filled 2020-05-23 (×8): qty 1

## 2020-05-23 NOTE — Plan of Care (Signed)

## 2020-05-23 NOTE — Plan of Care (Signed)
  Problem: Education: Goal: Knowledge of General Education information will improve Description: Including pain rating scale, medication(s)/side effects and non-pharmacologic comfort measures Outcome: Progressing   Problem: Nutrition: Goal: Adequate nutrition will be maintained Outcome: Progressing   Problem: Elimination: Goal: Will not experience complications related to bowel motility Outcome: Progressing Goal: Will not experience complications related to urinary retention Outcome: Progressing   Problem: Pain Managment: Goal: General experience of comfort will improve Outcome: Progressing   Problem: Skin Integrity: Goal: Risk for impaired skin integrity will decrease Outcome: Progressing

## 2020-05-23 NOTE — Progress Notes (Signed)
[facility] Progress Note  Patient: Nicole [name]    QQP:[id number]  DOA: 05/17/2020     Date of Service: the patient was seen and examined on 05/23/2020  Chief Complaint  Patient presents with  . Diarrhea  . 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 She was afebrile and hypotensive, improved with fluids. SCr above baseline at 1.19, BUN 28. The patient remained off her mental baseline per her sister, more drowsy, less talkative, has had >20 episodes of watery incontinent diarrhea. Some bloody stools now reported and diarrhea continues. CDiff negative.  Currently plan is resolution of small bowel obstruction.  Assessment and Plan: 1.  Small bowel obstruction Intractable nausea and vomiting Diarrhea Initially presented with diarrhea. Work-up is so far negative for any infectious etiology. Abdominal examination was reassuring. Patient was given oral Imodium. On 05/19/2020 patient was found to have a small bowel obstruction. X-ray shows evidence of constipation actually. NG tube was inserted. Small bowel protocol was initiated as well. X-ray on 05/21/2020 shows a significant improvement in bowel obstruction. NG tube removed on 05/22/2020. Tolerating full liquid Had a bowel movement as well.  2.  Acute encephalopathy on prior history of anoxic brain injury Currently improving. Continue supportive care. Continuing home medication as much as we can given restrictions with the small bowel obstruction  3.  Acute kidney injury Hyperkalemia, now hypokalemic due to SBO Renal function improving. Potassium improving. Monitor. Hold losartan. Treated with Lokelma.  4.  Essential hypertension. Hypotension. Syncope Currently blood pressure  stable. Holding all antihypertensive medications. Monitor.  5.  Seizure disorder Continue Tegretol and Keppra  6.  Hyperlipidemia Can resume statin tomorrow.  Diet: Full liquid diet DVT Prophylaxis: Subcutaneous Heparin    Advance goals of care discussion: DNR  Family Communication: No family was present at bedside, at the time of interview.   Disposition:  Status is: Inpatient  Remains inpatient appropriate because:IV treatments appropriate due to intensity of illness or inability to take PO   Dispo: The patient is from: Home              Anticipated d/c is to: Home              Anticipated d/c date is: 3 days              Patient currently is not medically stable to d/c.    Subjective: No nausea no vomiting.  No fever no chills.  Had a bowel movement last night.  Physical Exam:  General: Appear in mild distress, no Rash; Oral Mucosa Clear, moist. no Abnormal Neck Mass Or lumps, Conjunctiva normal  Cardiovascular: S1 and S2 Present, no Murmur, Respiratory: good respiratory effort, Bilateral Air entry present and Clear to Auscultation, no Crackles, no wheezes Abdomen: Bowel Sound present, Soft and no tenderness Extremities: no Pedal edema, no calf tenderness Neurology: alert and oriented to place and person affect flat in affect. no new focal deficit Gait not checked due to patient safety concerns   Vitals:   05/22/20 1317 05/22/20 2235 05/23/20 0402 05/23/20 1522  BP: (!) 155/89 (!) 144/82 (!) 156/76 138/87  Pulse: 95 99 95 (!) 107  Resp: 14 18 18    Temp: 99.8 F (37.7 C) 99.6 F (37.6 C) 98.7 F (37.1 C) 98.9 F (37.2 C)  TempSrc: Oral Oral Oral Oral  SpO2: 91% 92% 92% 92%  Weight:      Height:       No intake or output data in the 24 hours ending 05/23/20 1815 Filed Weights   05/18/20 1425  Weight: 85 kg    Data Reviewed: I have personally reviewed and interpreted daily labs, tele strips, imagings as discussed above. I reviewed all nursing notes,  pharmacy notes, vitals, pertinent old records I have discussed plan of care as described above with RN and patient/family.  CBC: Recent Labs  Lab 05/17/20 1339 05/17/20 1339 05/18/20 0418 05/19/20 1010 05/21/20 1137 05/22/20 0507 05/23/20 0402  WBC 8.7   < > 11.0* 10.5 5.8 5.7 8.7  NEUTROABS 7.6  --   --   --   --   --   --   HGB 15.0   < > 12.9 12.5 11.4* 10.5* 10.2*  HCT 45.5   < > 39.4 37.4 34.5* 31.5* 31.2*  MCV 100.0   < > 99.5 99.2 99.4 98.7 100.3*  PLT 228   < > 193 186 218 239 266   < > = values in this interval not displayed.   Basic Metabolic Panel: Recent Labs  Lab 05/17/20 1339 05/18/20 0418 05/19/20 1010 05/20/20 0802 05/21/20 1137 05/22/20 0507 05/23/20 0402  NA 137   < > 133* 135 140 139 141  K 4.9   < > 4.1 3.7 3.3* 3.2* 3.1*  CL 100   < > 100 101 104 106 104  CO2 25   < > 25 27 26 25 24   GLUCOSE 118*   < > 160* 124* 107* 115* 114*  BUN 28*   < > 25* 25* 26* 22 18  CREATININE 1.19*   < > 0.61 0.60 0.66 0.51 0.51  CALCIUM 10.6*   < > 8.9 8.3* 8.6* 8.4* 8.6*  MG 2.8*  --   --  1.8 2.1 1.9 2.0   < > = values in this interval not displayed.    Studies: No results found.  Scheduled Meds: . budesonide (PULMICORT) nebulizer solution  0.25 mg Nebulization BID  . carbamazepine  300 mg Oral BID  . docusate sodium  100 mg Oral BID  . gabapentin  400 mg Oral TID  . levETIRAcetam  500 mg Oral BID  . magic mouthwash  5 mL Oral QID  . pantoprazole  40 mg Oral Daily  . polyethylene glycol  17 g Oral Daily  . sodium chloride flush  3 mL Intravenous Q12H   Continuous Infusions: . sodium chloride 75 mL/hr at 05/22/20 1912   PRN Meds: acetaminophen **OR** acetaminophen, albuterol, liver oil-zinc oxide, ondansetron **OR** ondansetron (ZOFRAN) IV, phenol  Time spent: 35 minutes  Author: 05/24/20, MD [facility] 05/23/2020 6:15 PM  To reach On-call, see care teams to locate the attending and reach out via [email or web address]. Between 7PM-7AM, please  contact night-coverage If you still have difficulty reaching the attending provider, please page the [facility] (Director on Call) for [facility] on amion for assistance.

## 2020-05-23 NOTE — Progress Notes (Signed)
PHARMACIST - PHYSICIAN COMMUNICATION   CONCERNING: IV to Oral Route Change Policy  RECOMMENDATION: This patient is receiving keppra by the intravenous route.  Based on criteria approved by the Pharmacy and Therapeutics Committee, the intravenous medication(s) is/are being converted to the equivalent oral dose form(s).   DESCRIPTION: These criteria include:  The patient is eating (either orally or via tube) and/or has been taking other orally administered medications for a least 24 hours  The patient has no evidence of active gastrointestinal bleeding or impaired GI absorption (gastrectomy, short bowel, patient on TNA or NPO).  If you have questions about this conversion, please contact the Pharmacy Department  []   [phone] )  ( [phone] []   [phone] )  [facility] []   [phone] )  [facility] []   [phone] )  [facility] [x]   [phone] )  [facility]   ( [phone], [facility] 05/23/2020 1:49 PM

## 2020-05-24 LAB — BASIC METABOLIC PANEL
Anion gap: 12 (ref 5–15)
BUN: 16 mg/dL (ref 8–23)
CO2: 21 mmol/L — ABNORMAL LOW (ref 22–32)
Calcium: 8.2 mg/dL — ABNORMAL LOW (ref 8.9–10.3)
Chloride: 104 mmol/L (ref 98–111)
Creatinine, Ser: 0.51 mg/dL (ref 0.44–1.00)
GFR calc Af Amer: >60 mL/min (ref >60)
GFR calc non Af Amer: >60 mL/min (ref >60)
Glucose, Bld: 121 mg/dL — ABNORMAL HIGH (ref 70–99)
Potassium: 3.5 mmol/L (ref 3.5–5.1)
Sodium: 137 mmol/L (ref 135–145)

## 2020-05-24 LAB — CBC
HCT: 30.4 % — ABNORMAL LOW (ref 36.0–46.0)
Hemoglobin: 10.2 g/dL — ABNORMAL LOW (ref 12.0–15.0)
MCH: 33.1 pg (ref 26.0–34.0)
MCHC: 33.6 g/dL (ref 30.0–36.0)
MCV: 98.7 fL (ref 80.0–100.0)
Platelets: 275 10*3/uL (ref 150–400)
RBC: 3.08 MIL/uL — ABNORMAL LOW (ref 3.87–5.11)
RDW: 13.2 % (ref 11.5–15.5)
WBC: 7.9 10*3/uL (ref 4.0–10.5)
nRBC: 0 % (ref 0.0–0.2)

## 2020-05-24 LAB — MAGNESIUM: Magnesium: 1.7 mg/dL (ref 1.7–2.4)

## 2020-05-24 NOTE — Plan of Care (Signed)

## 2020-05-24 NOTE — TOC Progression Note (Signed)
Transition of Care [facility]) - Progression Note    Patient Details  Name: Nicole [name] MRN: [id number] Date of Birth: 11-07-1951  Transition of Care [facility] CM/SW Contact  Armanda Heritage, RN Phone Number: 05/24/2020, 1:03 PM  Clinical Narrative:    CM spoke with sister [name] regarding discharge planning.  Sister has been in contcat with patient's CAP case worker and is aware that patient's benefits will expire on July the 1st if she does not return to her group home.  In light of this information, sister selects for patient to return to her group home with [facility] therapy services versus SNF as the loss of CAP services will be very detrimental to her.   CM spoke with group home administrator April who reports that the facility can take patient back and requests hospital bed and hoyer lift.   CM spoke with Case worker and notified of plan to discharge back to group home. [facility] given referral for hospital bed and hoyer lift.  [facility] will provide HHPT and OT services.       Expected Discharge Plan: Group Home Barriers to Discharge: Continued Medical Work up  Expected Discharge Plan and Services Expected Discharge Plan: Group Home In-house Referral: Clinical Social Work Discharge Planning Services: CM Consult Post Acute Care Choice: Home Health, Skilled Nursing Facility Living arrangements for the past 2 months: Group Home ([facility] group home)                 DME Arranged: Hospital bed (hoyer lift) DME Agency: [facility] Date DME Agency Contacted: 05/24/20 Time DME Agency Contacted: 1302 Representative spoke with at DME Agency: Ian Malkin HH Arranged: PT, OT HH Agency: [facility] Date [facility] Agency Contacted: 05/24/20 Time HH Agency Contacted: 1303 Representative spoke with at [facility] Agency: Becky Sax   Social Determinants of Health (SDOH) Interventions    Readmission Risk Interventions No flowsheet data found.

## 2020-05-24 NOTE — Progress Notes (Signed)
Physical Therapy Treatment Patient Details Name: Nicole [name] MRN: [id number] DOB: 1951-02-26 Today's Date: 05/24/2020    History of Present Illness 69 yo female admitted with AMS, diarrhea, hypotension. 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 Pt is from a group home.    PT Comments    Pt declined bed mobility so assisted with a few LE exercises.  Pt reports pain in left foot and did not wish to perform movement on this leg.  Pt encouraged to assist however not assisting with movement.  Pt reports "you're making me mad" with attempt to reposition right UE on pillow (edematous hand) so therapist left room per pt wishes (had call bell and belongings to pt's liking prior to leaving room).  RN also notified.    Follow Up Recommendations  SNF     Equipment Recommendations  None recommended by PT    Recommendations for Other Services       Precautions / Restrictions Precautions Precautions: Fall    Mobility  Bed Mobility               General bed mobility comments: pt not agreeable  Transfers                    Ambulation/Gait                 Stairs             Wheelchair Mobility    Modified Rankin (Stroke Patients Only)       Balance                                            Cognition Arousal/Alertness: Awake/alert Behavior During Therapy: WFL for tasks assessed/performed Overall Cognitive Status: History of cognitive impairments - at baseline                                        Exercises General Exercises - Lower Extremity Ankle Circles/Pumps: AROM;Right;10 reps Heel Slides: PROM;10 reps;Right Hip ABduction/ADduction: PROM;Right;10 reps    General Comments        Pertinent Vitals/Pain Pain Assessment: Faces Faces Pain Scale: Hurts little more Pain Location: left foot  to touch per pt Pain Descriptors / Indicators: Grimacing;Guarding Pain Intervention(s): Repositioned;Monitored during session    Home Living                      Prior Function            PT Goals (current goals can now be found in the care plan section) Progress towards PT goals:  (pt self limiting today)    Frequency    Min 2X/week      PT Plan Current plan remains appropriate    Co-evaluation              AM-PAC PT "6 Clicks" Mobility   Outcome Measure  Help needed turning from your back to your side while in a flat bed without using bedrails?: Total Help needed moving from lying on your back to sitting on the side of a flat bed without using bedrails?: Total Help needed moving to and from a bed to a chair (  including a wheelchair)?: Total Help needed standing up from a chair using your arms ([name].g., wheelchair or bedside chair)?: Total Help needed to walk in hospital room?: Total Help needed climbing 3-5 steps with a railing? : Total 6 Click Score: 6    End of Session   Activity Tolerance: Patient limited by pain Patient left: in bed;with call bell/phone within reach;with bed alarm set Nurse Communication: Mobility status PT Visit Diagnosis: Muscle weakness (generalized) (M62.81);History of falling (Z91.81)     Time: 1140-1149 PT Time Calculation (min) (ACUTE ONLY): 9 min  Charges:  $Therapeutic Exercise: 8-22 mins                     Arlyce Dice, DPT Acute Rehabilitation Services Pager: [phone] Office: [phone]  Nicole [name] 05/24/2020, 1:06 PM

## 2020-05-24 NOTE — Progress Notes (Signed)
[facility] Progress Note  Patient: Nicole [name]    OZH:[id number]  DOA: 05/17/2020     Date of Service: the patient was seen and examined on 05/24/2020  Chief Complaint  Patient presents with  . Diarrhea  . 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 She was afebrile and hypotensive, improved with fluids. SCr above baseline at 1.19, BUN 28. The patient remained off her mental baseline per her sister, more drowsy, less talkative, has had >20 episodes of watery incontinent diarrhea. Some bloody stools now reported and diarrhea continues. CDiff negative.  Currently plan is resolution of small bowel obstruction.  Assessment and Plan: 1.  Small bowel obstruction Intractable nausea and vomiting Diarrhea Initially presented with diarrhea. Work-up is so far negative for any infectious etiology. Abdominal examination was reassuring. Patient was given oral Imodium. On 05/19/2020 patient was found to have a small bowel obstruction. X-ray shows evidence of constipation actually. NG tube was inserted. Small bowel protocol was initiated as well. X-ray on 05/21/2020 shows a significant improvement in bowel obstruction. NG tube removed on 05/22/2020. Tolerating full liquid will advance to regular diet.  2.  Acute encephalopathy on prior history of anoxic brain injury Currently improving. Continue supportive care. Continuing home medication as much as we can given restrictions with the small bowel obstruction  3.  Acute kidney injury Hyperkalemia, now hypokalemic due to SBO Renal function improving. Potassium improving. Hold losartan. Treated with Lokelma.  4.  Essential hypertension. Hypotension. Syncope Currently blood pressure  stable. Holding all antihypertensive medications. Monitor.  5.  Seizure disorder Continue Tegretol and Keppra  6.  Hyperlipidemia Resume statin  Diet: Regular diet DVT Prophylaxis: Subcutaneous Heparin    Advance goals of care discussion: DNR  Family Communication: family was present at bedside, at the time of interview.   Disposition:  Status is: Inpatient  Remains inpatient appropriate because:IV treatments appropriate due to intensity of illness or inability to take PO   Dispo: The patient is from: Home              Anticipated d/c is to: Home              Anticipated d/c date is: 3 days              Patient currently is not medically stable to d/c.    Subjective: No nausea no vomiting.  No fever no chills.  No chest pain no abdominal pain.  Tolerating oral diet.  Physical Exam:  General: Appear in mild distress, no Rash; Oral Mucosa Clear, moist. no Abnormal Neck Mass Or lumps, Conjunctiva normal  Cardiovascular: S1 and S2 Present, no Murmur, Respiratory: good respiratory effort, Bilateral Air entry present and Clear to Auscultation, no Crackles, no wheezes Abdomen: Bowel Sound present, Soft and no tenderness Extremities: no Pedal edema, no calf tenderness Neurology: alert and oriented to place and person affect flat in affect. no new focal deficit Gait not checked due to patient safety concerns   Vitals:   05/23/20 2028 05/24/20 0440 05/24/20 0823 05/24/20 1214  BP: (!) 148/84 (!) 141/79  136/70  Pulse: (!) 108 100 (!) 101 96  Resp: 20 20 20  (!) 21  Temp: 98.9 F (37.2 C) 99 F (37.2 C)  100.2 F (37.9 C)  TempSrc: Oral Oral  Oral  SpO2: 92% 92% 93% 98%  Weight:      Height:        Intake/Output Summary (Last 24 hours) at 05/24/2020 1855 Last data filed at 05/24/2020 1700 Gross per 24 hour  Intake --  Output 500 ml  Net -500 ml   Filed Weights   05/18/20 1425  Weight: 85 kg    Data Reviewed: I have personally reviewed and interpreted daily  labs, tele strips, imagings as discussed above. I reviewed all nursing notes, pharmacy notes, vitals, pertinent old records I have discussed plan of care as described above with RN and patient/family.  CBC: Recent Labs  Lab 05/19/20 1010 05/21/20 1137 05/22/20 0507 05/23/20 0402 05/24/20 0425  WBC 10.5 5.8 5.7 8.7 7.9  HGB 12.5 11.4* 10.5* 10.2* 10.2*  HCT 37.4 34.5* 31.5* 31.2* 30.4*  MCV 99.2 99.4 98.7 100.3* 98.7  PLT 186 218 239 266 818   Basic Metabolic Panel: Recent Labs  Lab 05/20/20 0802 05/21/20 1137 05/22/20 0507 05/23/20 0402 05/24/20 0425  NA 135 140 139 141 137  K 3.7 3.3* 3.2* 3.1* 3.5  CL 101 104 106 104 104  CO2 27 26 25 24  21*  GLUCOSE 124* 107* 115* 114* 121*  BUN 25* 26* 22 18 16   CREATININE 0.60 0.66 0.51 0.51 0.51  CALCIUM 8.3* 8.6* 8.4* 8.6* 8.2*  MG 1.8 2.1 1.9 2.0 1.7    Studies: No results found.  Scheduled Meds: . budesonide (PULMICORT) nebulizer solution  0.25 mg Nebulization BID  . carbamazepine  300 mg Oral BID  . docusate sodium  100 mg Oral BID  . gabapentin  400 mg Oral TID  . levETIRAcetam  500 mg Oral BID  . magic mouthwash  5 mL Oral QID  . pantoprazole  40 mg Oral Daily  . polyethylene glycol  17 g Oral Daily  . sodium chloride flush  3 mL Intravenous Q12H   Continuous Infusions: . sodium chloride Stopped (05/24/20 0912)   PRN Meds: acetaminophen **OR** acetaminophen, albuterol, liver oil-zinc oxide, ondansetron **OR** ondansetron (ZOFRAN) IV, phenol  Time spent: 35 minutes  Author: Berle Mull, MD [facility] 05/24/2020 6:55 PM  To reach On-call, see care teams to locate the attending and reach out via [email or web address]. Between 7PM-7AM, please contact night-coverage If you still have difficulty reaching the attending provider, please page the [facility] (Director on Call) for [facility] on amion for assistance.

## 2020-05-24 NOTE — Care Management Important Message (Signed)
Important Message  Patient Details IM Letter given to Sharol Roussel RN Case Manager to present to the Patient Name: Nicole [name] MRN: [id number] Date of Birth: Aug 30, 1951   Medicare Important Message Given:  Yes     Caren Macadam 05/24/2020, 10:57 AM

## 2020-05-24 NOTE — Progress Notes (Signed)
  Patient: Nicole [name] [id number]:[id number]   PCP: Elizabeth Palau, [name] DOB: Oct 29, 1951   DOA: 05/17/2020   DOS: 05/24/2020    patient requires the head of the bed to be elevated more than 30 degrees most of the time due to problems with aspiration  Electronically signed: Lynden Oxford, MD [facility] 05/24/2020 11:41 AM

## 2020-05-25 LAB — BASIC METABOLIC PANEL
Anion gap: 9 (ref 5–15)
Anion gap: 10 (ref 5–15)
BUN: 15 mg/dL (ref 8–23)
BUN: 14 mg/dL (ref 8–23)
CO2: 25 mmol/L (ref 22–32)
CO2: 28 mmol/L (ref 22–32)
Calcium: 8.2 mg/dL — ABNORMAL LOW (ref 8.9–10.3)
Calcium: 8.3 mg/dL — ABNORMAL LOW (ref 8.9–10.3)
Chloride: 103 mmol/L (ref 98–111)
Chloride: 100 mmol/L (ref 98–111)
Creatinine, Ser: 0.48 mg/dL (ref 0.44–1.00)
Creatinine, Ser: 0.75 mg/dL (ref 0.44–1.00)
GFR calc Af Amer: >60 mL/min (ref >60)
GFR calc Af Amer: >60 mL/min (ref >60)
GFR calc non Af Amer: >60 mL/min (ref >60)
GFR calc non Af Amer: >60 mL/min (ref >60)
Glucose, Bld: 120 mg/dL — ABNORMAL HIGH (ref 70–99)
Glucose, Bld: 145 mg/dL — ABNORMAL HIGH (ref 70–99)
Potassium: 3.2 mmol/L — ABNORMAL LOW (ref 3.5–5.1)
Potassium: 3.2 mmol/L — ABNORMAL LOW (ref 3.5–5.1)
Sodium: 137 mmol/L (ref 135–145)
Sodium: 138 mmol/L (ref 135–145)

## 2020-05-25 LAB — CBC
HCT: 30.7 % — ABNORMAL LOW (ref 36.0–46.0)
Hemoglobin: 10.1 g/dL — ABNORMAL LOW (ref 12.0–15.0)
MCH: 32.5 pg (ref 26.0–34.0)
MCHC: 32.9 g/dL (ref 30.0–36.0)
MCV: 98.7 fL (ref 80.0–100.0)
Platelets: 293 10*3/uL (ref 150–400)
RBC: 3.11 MIL/uL — ABNORMAL LOW (ref 3.87–5.11)
RDW: 13.1 % (ref 11.5–15.5)
WBC: 7.3 10*3/uL (ref 4.0–10.5)
nRBC: 0 % (ref 0.0–0.2)

## 2020-05-25 LAB — MAGNESIUM
Magnesium: 1.7 mg/dL (ref 1.7–2.4)
Magnesium: 1.6 mg/dL — ABNORMAL LOW (ref 1.7–2.4)

## 2020-05-25 MED ORDER — FUROSEMIDE 40 MG PO TABS
40.0000 mg | ORAL_TABLET | Freq: Two times a day (BID) | ORAL | Status: DC
  Administered 2020-05-25 – 2020-05-27 (×6): 40 mg via ORAL
  Filled 2020-05-25 (×6): qty 1

## 2020-05-25 MED ORDER — POTASSIUM CHLORIDE CRYS ER 20 MEQ PO TBCR
40.0000 meq | EXTENDED_RELEASE_TABLET | Freq: Once | ORAL | Status: AC
  Administered 2020-05-25: 40 meq via ORAL
  Filled 2020-05-25: qty 2

## 2020-05-25 MED ORDER — SODIUM CHLORIDE 0.9% FLUSH
10.0000 mL | INTRAVENOUS | Status: DC | PRN
  Administered 2020-05-26: 10 mL

## 2020-05-25 MED ORDER — SODIUM CHLORIDE 0.9% FLUSH
10.0000 mL | Freq: Two times a day (BID) | INTRAVENOUS | Status: DC
  Administered 2020-05-26 (×2): 10 mL

## 2020-05-25 MED ORDER — MAGNESIUM SULFATE 2 GM/50ML IV SOLN
2.0000 g | Freq: Once | INTRAVENOUS | Status: AC
  Administered 2020-05-25: 2 g via INTRAVENOUS
  Filled 2020-05-25: qty 50

## 2020-05-25 NOTE — Progress Notes (Signed)
Occupational Therapy Treatment Patient Details Name: Nicole [name] MRN: [id number] DOB: 1951-06-05 Today's Date: 05/25/2020    History of present illness 69 yo female admitted with AMS, diarrhea, hypotension. 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 Pt is from a group home.   OT comments  Treatment focused on getting patient to participate in self care tasks and using upper extremities. 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 Therapist provided gentle ROM and muscle massage to decrease pain and improve comfort. Patient able to perform oral care while in bed with set up and min assist to raise right elbow so patient could reach her mouth. Patient total assist for pericare and bed mobility - with therapist encouraging/instructing patient to use upper extremities and assist with mobility in bed.   Follow Up Recommendations  SNF (Per sister, patient returning to group home with hoyer and hospital bed.)    Equipment Recommendations       Recommendations for Other Services      Precautions / Restrictions Precautions Precautions: Fall Restrictions Weight Bearing Restrictions: No       Mobility Bed Mobility Overal bed mobility: Needs Assistance Bed Mobility: Rolling Rolling: Total assist;+2 for physical assistance            Transfers                      Balance                                           ADL either performed or assessed with clinical judgement   ADL   Eating/Feeding: Set up;Bed level Eating/Feeding Details (indicate cue type and reason): Patient's sister reports patient has performed limited self feeding secondary to RUE weakness and decreased motor control. Therapist encouraged patient  and sister to allow patient to attempt 1/2 of feeding even if quality poor to promote independence and use of upper extremities. Patient able to use right hand to feed herself a cookie. Grooming: Set up;Minimal assistance;Oral care;Wash/dry face Grooming Details (indicate cue type and reason): Patient provided with set up to perform teeth brushing in bed. Patient able to use right hand to bring tooth brush to mouth. Limited upper extremity use with patient moving her head to compensate. Therapist provived min assist at right elbow to improve patient's reach. Patient able to swish and spit and wipe mouth with verbal cues/encouragement.                     Toileting- Clothing Manipulation and Hygiene: Bed level;Total assistance;+2 for physical assistance Toileting - Clothing Manipulation Details (indicate cue type and reason): Total assist for pericare. Patient + 2 assistance for rolling. Therapist providing encouragement for patient to assist with rolling, use of upper extremities on bed rail to assist with maintaining position.             Vision       Perception     Praxis      Cognition Arousal/Alertness: Awake/alert Behavior During Therapy: WFL for tasks assessed/performed Overall Cognitive Status: History of cognitive impairments - at baseline  Exercises     Shoulder Instructions       General Comments      Pertinent Vitals/ Pain       Pain Assessment: Faces Faces Pain Scale: Hurts little more Pain Location: neck Pain Descriptors / Indicators: Grimacing;Guarding Pain Intervention(s): Limited activity within patient's tolerance  Home Living                                          Prior Functioning/Environment              Frequency  Min 2X/week        Progress Toward Goals  OT Goals(current goals can now be found in the care plan section)  Progress towards OT goals: Not  progressing toward goals - comment (poor participation and patient not understanding therapeutic process.)  Acute Rehab OT Goals Patient Stated Goal: To improve strength and functional abilities OT Goal Formulation: With patient/family Time For Goal Achievement: 06/01/20 Potential to Achieve Goals: Fair  Plan Discharge plan remains appropriate    Co-evaluation                 AM-PAC OT "6 Clicks" Daily Activity     Outcome Measure   Help from another person eating meals?: A Lot Help from another person taking care of personal grooming?: A Little Help from another person toileting, which includes using toliet, bedpan, or urinal?: Total Help from another person bathing (including washing, rinsing, drying)?: A Lot Help from another person to put on and taking off regular upper body clothing?: A Lot Help from another person to put on and taking off regular lower body clothing?: Total 6 Click Score: 11    End of Session    OT Visit Diagnosis: Muscle weakness (generalized) (M62.81);Other symptoms and signs involving the nervous system (R29.898);Other symptoms and signs involving cognitive function;Pain;Hemiplegia and hemiparesis Hemiplegia - Right/Left: Left Hemiplegia - dominant/non-dominant: Non-Dominant Hemiplegia - caused by: Unspecified   Activity Tolerance Patient limited by pain (neck pain, poor motivation.)   Patient Left     Nurse Communication  (okay to see)        Time: 2536-6440 OT Time Calculation (min): 32 min  Charges: OT General Charges $OT Visit: 1 Visit OT Treatments $Self Care/Home Management : 8-22 mins $Therapeutic Activity: 8-22 mins  Shuan Statzer, OTR/L Acute Care Rehab Services  Office [phone] Pager: [phone]    Kelli Churn 05/25/2020, 12:20 PM

## 2020-05-25 NOTE — TOC Progression Note (Signed)
Transition of Care [facility]) - Progression Note    Patient Details  Name: BLAKELEY SCHEIER MRN: [id number] Date of Birth: [birth year]  [facility] CM/SW Contact  Armanda Heritage, RN Phone Number: 05/25/2020, 4:50 PM  Clinical Narrative:  CM received call that patient has been active with [facility] for [facility] services.  Agency to resume [facility] services at discharge.      Expected Discharge Plan: Group Home Barriers to Discharge: Continued Medical Work up  Expected Discharge Plan and Services Expected Discharge Plan: Group Home In-house Referral: Clinical Social Work Discharge Planning Services: CM Consult Post Acute Care Choice: Home Health, Skilled Nursing Facility Living arrangements for the past 2 months: Group Home ([facility] S group home)                 DME Arranged: Hospital bed (hoyer lift) DME Agency: [employer] Date DME Agency Contacted: 05/24/20 Time DME Agency Contacted: 1302 Representative spoke with at DME Agency: Ian Malkin HH Arranged: PT, OT HH Agency: [facility] Date [facility] Agency Contacted: 05/24/20 Time HH Agency Contacted: 1303 Representative spoke with at [facility] Agency: Becky Sax   Social Determinants of Health (SDOH) Interventions    Readmission Risk Interventions No flowsheet data found.

## 2020-05-25 NOTE — Progress Notes (Signed)
[facility] Progress Note  Patient: Nicole [name]    YQM:[id number]  DOA: 05/17/2020     Date of Service: the patient was seen and examined on 05/25/2020  Chief Complaint  Patient presents with  . Diarrhea  . 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 She was afebrile and hypotensive, improved with fluids. SCr above baseline at 1.19, BUN 28. The patient remained off her mental baseline per her sister, more drowsy, less talkative, has had >20 episodes of watery incontinent diarrhea. Some bloody stools now reported and diarrhea continues. CDiff negative.  Currently plan is monitor for improvement in volume overload.  Assessment and Plan: 1.  Small bowel obstruction Intractable nausea and vomiting Diarrhea Initially presented with diarrhea. Work-up is so far negative for any infectious etiology. Abdominal examination was reassuring. Patient was given oral Imodium. On 05/19/2020 patient was found to have a small bowel obstruction. X-ray shows evidence of constipation actually. NG tube was inserted. Small bowel protocol was initiated as well. X-ray on 05/21/2020 shows a significant improvement in bowel obstruction. NG tube removed on 05/22/2020. Tolerating regular diet. Also having bowel movement.  2.  Acute encephalopathy on prior history of anoxic brain injury Currently improving. Continue supportive care. Continuing home medication as much as we can given restrictions with the small bowel obstruction  3.  Acute kidney injury Hyperkalemia, now hypokalemic due to SBO Renal function improving. Potassium improving. Hold losartan. Treated with Lokelma.  4.  Essential hypertension. Hypotension. Syncope Currently blood pressure  stable. Holding all antihypertensive medications. Monitor.  5.  Seizure disorder Continue Tegretol and Keppra  6.  Hyperlipidemia Resume statin  7. Hypokalemia. Hypomagnesemia. Currently correcting.  8. HFpEF. Oral Lasix.  Diet: Regular diet DVT Prophylaxis: Subcutaneous Heparin    Advance goals of care discussion: DNR  Family Communication: family was present at bedside, at the time of interview.  The pt provided permission to discuss medical plan with the family. Opportunity was given to ask question and all questions were answered satisfactorily.   Disposition:  Status is: Inpatient  Remains inpatient appropriate because:Ongoing diagnostic testing needed not appropriate for outpatient work up   Dispo: The patient is from: Home              Anticipated d/c is to: Group home              Anticipated d/c date is: 1 day              Patient currently is not medically stable to d/c. Likely can go home tomorrow depending on volume status  Subjective: No nausea no vomiting. Reports to have some shortness of breath. No fever no chills.  Physical Exam:  General: Appear in mild distress, no Rash; Oral Mucosa Clear, moist. no Abnormal Neck Mass Or lumps, Conjunctiva normal  Cardiovascular: S1 and S2 Present, no Murmur, Respiratory: increased respiratory effort, Bilateral Air entry present and bilateral  Crackles, no wheezes Abdomen: Bowel Sound present, Soft and no tenderness Extremities: bilateral  Pedal edema, no calf tenderness Neurology: alert and oriented to time, place, and person affect appropriate. no new focal deficit, left hemiparesis chronic. Gait not checked due to patient safety concerns  Vitals:   05/25/20 0620 05/25/20 0936 05/25/20 0939 05/25/20 1227  BP: 119/65   137/73  Pulse: 100   Marland Kitchen)  101  Resp: 18   17  Temp: 98.9 F (37.2 C)   99.8 F (37.7 C)  TempSrc: Oral   Oral  SpO2: 93% (!) 89% (!) 89% 93%  Weight:      Height:        Intake/Output  Summary (Last 24 hours) at 05/25/2020 2015 Last data filed at 05/25/2020 0900 Gross per 24 hour  Intake 0 ml  Output --  Net 0 ml   Filed Weights   05/18/20 1425  Weight: 85 kg    Data Reviewed: I have personally reviewed and interpreted daily labs, tele strips, imagings as discussed above. I reviewed all nursing notes, pharmacy notes, vitals, pertinent old records I have discussed plan of care as described above with RN and patient/family.  CBC: Recent Labs  Lab 05/21/20 1137 05/22/20 0507 05/23/20 0402 05/24/20 0425 05/25/20 0424  WBC 5.8 5.7 8.7 7.9 7.3  HGB 11.4* 10.5* 10.2* 10.2* 10.1*  HCT 34.5* 31.5* 31.2* 30.4* 30.7*  MCV 99.4 98.7 100.3* 98.7 98.7  PLT 218 239 266 275 817   Basic Metabolic Panel: Recent Labs  Lab 05/22/20 0507 05/23/20 0402 05/24/20 0425 05/25/20 0424 05/25/20 1442  NA 139 141 137 137 138  K 3.2* 3.1* 3.5 3.2* 3.2*  CL 106 104 104 103 100  CO2 25 24 21* 25 28  GLUCOSE 115* 114* 121* 120* 145*  BUN 22 18 16 15 14   CREATININE 0.51 0.51 0.51 0.48 0.75  CALCIUM 8.4* 8.6* 8.2* 8.2* 8.3*  MG 1.9 2.0 1.7 1.7 1.6*    Studies: No results found.  Scheduled Meds: . budesonide (PULMICORT) nebulizer solution  0.25 mg Nebulization BID  . carbamazepine  300 mg Oral BID  . docusate sodium  100 mg Oral BID  . furosemide  40 mg Oral BID  . gabapentin  400 mg Oral TID  . levETIRAcetam  500 mg Oral BID  . magic mouthwash  5 mL Oral QID  . pantoprazole  40 mg Oral Daily  . polyethylene glycol  17 g Oral Daily  . potassium chloride  40 mEq Oral Once  . sodium chloride flush  3 mL Intravenous Q12H   Continuous Infusions: . sodium chloride Stopped (05/24/20 0912)  . magnesium sulfate bolus IVPB     PRN Meds: acetaminophen **OR** acetaminophen, albuterol, liver oil-zinc oxide, ondansetron **OR** ondansetron (ZOFRAN) IV, phenol  Time spent: 35 minutes  Author: Berle Mull, MD [facility] 05/25/2020 8:15 PM  To reach On-call, see care  teams to locate the attending and reach out via [email or web address]. Between 7PM-7AM, please contact night-coverage If you still have difficulty reaching the attending provider, please page the [facility] (Director on Call) for [facility] on amion for assistance.

## 2020-05-25 NOTE — TOC Progression Note (Signed)
Transition of Care [facility]) - Progression Note    Patient Details  Name: Nicole [name] MRN: [id number] Date of Birth: 1951-09-28  Transition of Care [facility]) CM/SW Contact  Armanda Heritage, RN Phone Number: 05/25/2020, 9:49 AM  Clinical Narrative:    CM confirmed dme (hospital bed/hoyer lift) has been delivered to group home.  CM called administrator April and notified that patient will not be medically stable for discharge today as previously anticipated.     Expected Discharge Plan: Group Home Barriers to Discharge: Continued Medical Work up  Expected Discharge Plan and Services Expected Discharge Plan: Group Home In-house Referral: Clinical Social Work Discharge Planning Services: CM Consult Post Acute Care Choice: Home Health, Skilled Nursing Facility Living arrangements for the past 2 months: Group Home (M and S group home)                 DME Arranged: Hospital bed (hoyer lift) DME Agency: [employer] Date DME Agency Contacted: 05/24/20 Time DME Agency Contacted: 1302 Representative spoke with at DME Agency: Ian Malkin HH Arranged: PT, OT HH Agency: [facility] Date [place] [facility] Agency Contacted: 05/24/20 Time HH Agency Contacted: 1303 Representative spoke with at [name] Agency: Becky Sax   Social Determinants of Health (SDOH) Interventions    Readmission Risk Interventions No flowsheet data found.

## 2020-05-26 ENCOUNTER — Inpatient Hospital Stay (HOSPITAL_COMMUNITY): Payer: Medicare Other

## 2020-05-26 ENCOUNTER — Ambulatory Visit: Payer: Self-pay | Admitting: Adult Health

## 2020-05-26 DIAGNOSIS — K567 Ileus, unspecified: Secondary | ICD-10-CM

## 2020-05-26 LAB — COMPREHENSIVE METABOLIC PANEL
ALT: 37 U/L (ref 0–44)
AST: 60 U/L — ABNORMAL HIGH (ref 15–41)
Albumin: 1.8 g/dL — ABNORMAL LOW (ref 3.5–5.0)
Alkaline Phosphatase: 45 U/L (ref 38–126)
Anion gap: 10 (ref 5–15)
BUN: 14 mg/dL (ref 8–23)
CO2: 27 mmol/L (ref 22–32)
Calcium: 7.4 mg/dL — ABNORMAL LOW (ref 8.9–10.3)
Chloride: 97 mmol/L — ABNORMAL LOW (ref 98–111)
Creatinine, Ser: 0.61 mg/dL (ref 0.44–1.00)
GFR calc Af Amer: >60 mL/min (ref >60)
GFR calc non Af Amer: >60 mL/min (ref >60)
Glucose, Bld: 106 mg/dL — ABNORMAL HIGH (ref 70–99)
Potassium: 3.2 mmol/L — ABNORMAL LOW (ref 3.5–5.1)
Sodium: 134 mmol/L — ABNORMAL LOW (ref 135–145)
Total Bilirubin: 0.8 mg/dL (ref 0.3–1.2)
Total Protein: 5.8 g/dL — ABNORMAL LOW (ref 6.5–8.1)

## 2020-05-26 LAB — CBC WITH DIFFERENTIAL/PLATELET
Abs Immature Granulocytes: 0.06 10*3/uL (ref 0.00–0.07)
Basophils Absolute: 0 10*3/uL (ref 0.0–0.1)
Basophils Relative: 1 %
Eosinophils Absolute: 0.1 10*3/uL (ref 0.0–0.5)
Eosinophils Relative: 1 %
HCT: 29.3 % — ABNORMAL LOW (ref 36.0–46.0)
Hemoglobin: 9.6 g/dL — ABNORMAL LOW (ref 12.0–15.0)
Immature Granulocytes: 1 %
Lymphocytes Relative: 14 %
Lymphs Abs: 0.9 10*3/uL (ref 0.7–4.0)
MCH: 32.5 pg (ref 26.0–34.0)
MCHC: 32.8 g/dL (ref 30.0–36.0)
MCV: 99.3 fL (ref 80.0–100.0)
Monocytes Absolute: 0.5 10*3/uL (ref 0.1–1.0)
Monocytes Relative: 8 %
Neutro Abs: 4.9 10*3/uL (ref 1.7–7.7)
Neutrophils Relative %: 75 %
Platelets: 275 10*3/uL (ref 150–400)
RBC: 2.95 MIL/uL — ABNORMAL LOW (ref 3.87–5.11)
RDW: 13.2 % (ref 11.5–15.5)
WBC: 6.5 10*3/uL (ref 4.0–10.5)
nRBC: 0 % (ref 0.0–0.2)

## 2020-05-26 LAB — MAGNESIUM: Magnesium: 2 mg/dL (ref 1.7–2.4)

## 2020-05-26 MED ORDER — POTASSIUM CHLORIDE CRYS ER 20 MEQ PO TBCR
40.0000 meq | EXTENDED_RELEASE_TABLET | ORAL | Status: AC
  Administered 2020-05-26 (×2): 40 meq via ORAL
  Filled 2020-05-26 (×2): qty 2

## 2020-05-26 MED ORDER — LOPERAMIDE HCL 2 MG PO CAPS
2.0000 mg | ORAL_CAPSULE | ORAL | Status: DC | PRN
  Administered 2020-05-27: 2 mg via ORAL
  Filled 2020-05-26: qty 1

## 2020-05-26 NOTE — Progress Notes (Addendum)
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  Admitted for altered mental status, lethargy, diarrhea.  Diarrhea resolved.  Infectious work-up was unrevealing.  Developed partial small bowel obstruction versus ileus treated conservatively.  A & P  Partial small bowel obstruction versus ileus, with associated intractable nausea and vomiting.  Initially presented with diarrhea treated with Imodium.  Then found to have small bowel obstruction of 6/16.  Small bowel protocol initiated with improvement. --Last imaging 6/18 showed resolution of small bowel obstruction. --Tolerating diet  Diarrhea.  C. difficile negative.  GI pathogen panel negative --Ongoing significant diarrhea.  Has been receiving Colace daily (ordered twice today).  MiraLAX ordered but has not been given lately. -- large stool burden seen on initial x-ray 6/16.  Seem to improve 6/17. --Will repeat abdominal imaging today to exclude significant stool burden.  If negative, will start Imodium. --Stop Colace.  Mild volume overload, likely secondary to aggressive volume repletion. --Improved with Lasix.  We will continue Lasix today.  Acute metabolic encephalopathy superimposed on PMH anoxic brain injury, mental retardation.  Thought secondary to dehydration and acute kidney injury. --Appears resolved at this point.  Acute kidney injury with associated hyperkalemia, secondary to stool output resulting in dehydration. --Losartan held.  Hyperkalemia treated with Lokelma. --Acute kidney injury resolved.  Possible syncope, hypotension on admission.  Not clear what happened but suspect vasovagal syncope from dehydration. --No further evaluation  suggested.  Seizure disorder.  Tegretol level therapeutic. --Continue Tegretol gabapentin, and Keppra  Disposition Plan:  Discussion: Overall seems to have improved but continued to have diarrhea.  Stop Colace.  Check abdominal x-ray to rule out significant stool burden.  If negative will start Imodium.  Continue current diet.  Continue Lasix today for mild volume overload.  Status is: Inpatient  Remains inpatient appropriate because:Inpatient level of care appropriate due to severity of illness   Dispo: The patient is from: Group home              Anticipated d/c is to: Group home              Anticipated d/c date is: 1 day              Patient currently is not medically stable to d/c.  DVT prophylaxis: SCDs Start: 05/17/20 1932  Code Status: DNR Family Communication: Discussed in detail with sister/guardian at bedside  Brendia Sacks, MD  [facility] Direct contact: see [email or web address] (further directions at bottom of note if needed) 7PM-7AM contact night coverage as at bottom of note 05/26/2020, 12:49 PM  LOS: 8 days   Interval History/Subjective  Patient denies complaints, no pain, no nausea or vomiting.  Tolerated breakfast per nurse, sister at bedside. Sister at bedside concerned about ongoing diarrhea.  Per nurse about 6 times in the last 24 hours.  Seems to be a little thicker today.  Objective   Vitals:  Vitals:   05/26/20 0624 05/26/20 0802  BP: 124/66   Pulse: 92   Resp: 20   Temp: 100.2 F (37.9 C)   SpO2: 91% 92%    Exam:  Constitutional.  Appears calm, comfortable. Respiratory.  Clear to auscultation bilaterally.  No wheezes, rales or rhonchi.  Normal respiratory effort. Cardiovascular.  Regular rate and rhythm.  No  murmur, rub or gallop.  1+ bilateral lower extremity edema, particularly in the thighs.  Trace edema in the hands. Psychiatric.  Grossly normal mood and affect.  Speech fluent and appropriate. Abdomen.  Distended, soft, nontender.   Decreased bowel sounds.  I have personally reviewed the following:   Today's Data  . Potassium 3.2, remainder BMP unremarkable. . Hemoglobin 9.6 and stable.  Scheduled Meds: . budesonide (PULMICORT) nebulizer solution  0.25 mg Nebulization BID  . carbamazepine  300 mg Oral BID  . furosemide  40 mg Oral BID  . gabapentin  400 mg Oral TID  . levETIRAcetam  500 mg Oral BID  . magic mouthwash  5 mL Oral QID  . pantoprazole  40 mg Oral Daily  . potassium chloride  40 mEq Oral Q4H  . sodium chloride flush  10-40 mL Intracatheter Q12H  . sodium chloride flush  3 mL Intravenous Q12H   Continuous Infusions:   Principal Problem:   Diarrhea Active Problems:   Allergic rhinitis   Hyperlipidemia   Hypertension   Hypotension   Seizures ([place])   AKI (acute kidney injury) ([place])   Dehydration   Ileus ([place])   LOS: 8 days   How to contact the [facility] Attending or Consulting provider [place] or covering provider during after hours [place], for this patient?  1. Check the care team in [facility] and look for a) attending/consulting TRH provider listed and b) the [facility] team listed 2. Log into [email or web address] and use [place]'s universal password to access. If you do not have the password, please contact the hospital operator. 3. Locate the [facility] provider you are looking for under [facility] and page to a number that you can be directly reached. 4. If you still have difficulty reaching the provider, please page the [facility] (Director on Call) for the Hospitalists listed on amion for assistance.    Time spent 35 minutes, greater than 50% in counseling, coordination of care

## 2020-05-26 NOTE — Plan of Care (Signed)
  Problem: Education: Goal: Knowledge of General Education information will improve Description Including pain rating scale, medication(s)/side effects and non-pharmacologic comfort measures Outcome: Progressing   

## 2020-05-27 LAB — MAGNESIUM: Magnesium: 1.8 mg/dL (ref 1.7–2.4)

## 2020-05-27 MED ORDER — GABAPENTIN 300 MG PO CAPS: 300.0000 mg | ORAL_CAPSULE | Freq: Three times a day (TID) | ORAL | Status: DC

## 2020-05-27 MED ORDER — LOPERAMIDE HCL 2 MG PO CAPS: 2.0000 mg | ORAL_CAPSULE | ORAL | Status: AC | PRN

## 2020-05-27 NOTE — Progress Notes (Signed)
EMS transport at bedside, patient has no IV access and no telemetry, was removed by previous shift.  Discharge packet provided to EMS V/S taken 115/65-82-18-92% on room air.  Purewick removed with in container of clear amber urine.  Small stool noted patient cleaned and depends put on.  Patient had floral gown and peach house dress on and taken were dark frame glasses, grey cap and multi-colored neck pillow.  No other belongings found.  Patient assisted to stretcher and left with no c/o prior to leaving.  Report was called to group home to April by previous nurse.

## 2020-05-27 NOTE — TOC Progression Note (Signed)
Transition of Care [facility]) - Progression Note    Patient Details  Name: TENNILE STYLES MRN: [id number] Date of Birth: 08-Aug-1951  [facility]) CM/SW Contact  Geni Bers, RN Phone Number: 05/27/2020, 2:33 PM  Clinical Narrative:    Discharge summary faxed to [facility] at group home [phone].    Expected Discharge Plan: Group Home Barriers to Discharge: Continued Medical Work up  Expected Discharge Plan and Services Expected Discharge Plan: Group Home In-house Referral: Clinical Social Work Discharge Planning Services: CM Consult Post Acute Care Choice: Home Health, Skilled Nursing Facility Living arrangements for the past 2 months: Group Home ([facility] S group home) Expected Discharge Date: 05/27/20               DME Arranged: Hospital bed (hoyer lift) DME Agency: [employer] Date DME Agency Contacted: 05/24/20 Time DME Agency Contacted: 1302 Representative spoke with at DME Agency: Ian Malkin HH Arranged: PT, OT HH Agency: [facility] Date [facility] Agency Contacted: 05/24/20 Time HH Agency Contacted: 1303 Representative spoke with at [facility] Agency: Becky Sax   Social Determinants of Health (SDOH) Interventions    Readmission Risk Interventions No flowsheet data found.

## 2020-05-27 NOTE — Progress Notes (Addendum)
Physical Therapy Treatment Patient Details Name: Nicole [name] MRN: [id number] DOB: 03-17-1951 Today's Date: 05/27/2020    History of Present Illness 69 yo female admitted with AMS, diarrhea, hypotension. 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 Pt is from a group home.    PT Comments    Pt with sister in room encouraging her to participate, able to come to sitting at EOB with sister assisting and motivating to reduce anxiousness with fear of falling. Pt actively trying to slide BLE to EOB and reaching with RUE to bedrail with cueing. Pt tolerates sitting EOB for 7 minutes, asks to return to supine but able to motivate to continue sitting EOB; trunk balance varies between contact guard and mod A to maintain sitting posture, cued verbally with good improvement. Pt fatigues requiring return to supine, denies pain or dizziness, but BP 95/26mmHg. Once in supine pt's BP 103/53mmHg. Pt's HR in the 90s and SpO2 99% with mobility on RA. Pt tolerates therapeutic exercises with much encouragement, LLE requiring PROM and RLE AAROM. Pt reports intermittent neck pain with all cervical movements, unable to determine where pain is with palpation. Pt repositioned in bed max A x2, all needs in reach and sister in room. Patient will benefit from continued physical therapy in hospital and recommendations below to increase strength, balance, endurance for safe ADLs and gait.   Follow Up Recommendations  SNF     Equipment Recommendations  None recommended by PT    Recommendations for Other Services       Precautions / Restrictions Precautions Precautions: Fall Restrictions Weight Bearing Restrictions: No    Mobility  Bed Mobility Overal bed mobility: Needs Assistance Bed Mobility: Supine to Sit;Sit to Supine  Supine to sit: Max assist;+2 for physical assistance Sit to supine: Max  assist;+2 for physical assistance   General bed mobility comments: max A for supine-sit, cues for hand placement and education prior to mobilizing to improve anxiousness with mobility, sister present encouraging with sitting up  Transfers  General transfer comment: pt declined  Ambulation/Gait      Stairs             Wheelchair Mobility    Modified Rankin (Stroke Patients Only)       Balance Overall balance assessment: Needs assistance Sitting-balance support: Feet supported;Bilateral upper extremity supported Sitting balance-Leahy Scale: Poor Sitting balance - Comments: varies contact guard to mod A to maintain upright sitting with verbal and tactile cues, demonstrates slouched posture         Cognition Arousal/Alertness: Awake/alert Behavior During Therapy: WFL for tasks assessed/performed Overall Cognitive Status: History of cognitive impairments - at baseline         Exercises General Exercises - Lower Extremity Ankle Circles/Pumps: Supine;AROM;Right;10 reps Short Arc Quad: Supine;Right;10 reps;Left (R AAROM, L PROM) Long Arc Quad: Seated;Right;5 reps (cues for motor control)    General Comments General comments (skin integrity, edema, etc.): Pt tolerates sitting EOB for 7 min with sister seated at side encouraging her; denies pain or dizziness, BP 95/96mmHg after 7 min in sitting, 103/29mmHg once returned to supine      Pertinent Vitals/Pain Pain Assessment: Faces Faces Pain Scale: Hurts a little bit Pain Location: neck Pain Descriptors / Indicators: Grimacing;Guarding Pain Intervention(s): Limited activity within patient's tolerance;Monitored during session    Home Living  Prior Function            PT Goals (current goals can now be found in the care plan section) Acute Rehab PT Goals Patient Stated Goal: To improve strength and functional abilities PT Goal Formulation: With patient/family Time For Goal  Achievement: 06/01/20 Potential to Achieve Goals: Fair Progress towards PT goals: Progressing toward goals    Frequency    Min 2X/week      PT Plan Current plan remains appropriate    Co-evaluation              AM-PAC PT "6 Clicks" Mobility   Outcome Measure  Help needed turning from your back to your side while in a flat bed without using bedrails?: Total Help needed moving from lying on your back to sitting on the side of a flat bed without using bedrails?: Total Help needed moving to and from a bed to a chair (including a wheelchair)?: Total Help needed standing up from a chair using your arms (e.g., wheelchair or bedside chair)?: Total Help needed to walk in hospital room?: Total Help needed climbing 3-5 steps with a railing? : Total 6 Click Score: 6    End of Session   Activity Tolerance: Patient tolerated treatment well Patient left: in bed;with call bell/phone within reach;with bed alarm set;with family/visitor present Nurse Communication: Mobility status PT Visit Diagnosis: Muscle weakness (generalized) (M62.81);History of falling (Z91.81)     Time: 1610-9604 PT Time Calculation (min) (ACUTE ONLY): 23 min  Charges:  $Therapeutic Exercise: 8-22 mins $Therapeutic Activity: 8-22 mins                      Tori Nadalie Laughner PT, DPT 05/27/20, 12:51 PM

## 2020-05-27 NOTE — TOC Progression Note (Signed)
Transition of Care [facility]) - Progression Note    Patient Details  Name: Nicole [name] MRN: [id number] Date of Birth: [birth year]  Transition of [facility]) CM/SW Contact  Geni Bers, RN Phone Number: 05/27/2020, 1:59 PM  Clinical Narrative:    Kathlene November at Group Home was called [phone]. Pt may return to home. Will call PTAR.  Expected Discharge Plan: Group Home Barriers to Discharge: Continued Medical Work up  Expected Discharge Plan and Services Expected Discharge Plan: Group Home In-house Referral: Clinical Social Work Discharge Planning Services: CM Consult Post Acute Care Choice: Home Health, Skilled Nursing Facility Living arrangements for the past 2 months: Group Home (M and S group home) Expected Discharge Date: 05/27/20               DME Arranged: Hospital bed (hoyer lift) DME Agency: [employer] Date DME Agency Contacted: 05/24/20 Time DME Agency Contacted: [phone] Representative spoke with at DME Agency: Ian Malkin HH Arranged: PT, OT HH Agency: [facility] Date [facility] Agency Contacted: 05/24/20 Time HH Agency Contacted: [phone] Representative spoke with at [facility] Agency: Becky Sax   Social Determinants of Health (SDOH) Interventions    Readmission Risk Interventions No flowsheet data found.

## 2020-05-27 NOTE — Plan of Care (Signed)
  Problem: Education: Goal: Knowledge of General Education information will improve Description Including pain rating scale, medication(s)/side effects and non-pharmacologic comfort measures Outcome: Progressing   

## 2020-05-27 NOTE — Discharge Summary (Signed)
Physician Discharge Summary  Nicole BlockerConnie G [name] ZOX:[id number] DOB: 09/22/1951 DOA: 05/17/2020  PCP: Elizabeth PalauAnderson, [name], [name]  Admit date: 05/17/2020 Discharge date: 05/27/2020  Recommendations for Outpatient Follow-up:   Diarrhea.  C. difficile negative.  GI pathogen panel negative --Slowing down.  X-ray unremarkable.  Imodium for the next day or 2 then stop and reassess.  Monitor for constipation.   Follow-up Information    [name], [facility] Follow up.   Specialty: Home Health Services Why: agency will provide home health services Contact information: [address] [place] [id number] [phone]              Discharge Diagnoses: Principal diagnosis is #1 1. Diarrhea non-infectious 2. Partial small bowel obstruction versus ileus 3. Acute metabolic encephalopathy 4. Acute kidney injury with associated hyperkalemia 5. Possible syncope, hypotension on admission 6. 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    Hospital Course:  Admitted for altered mental status, lethargy, diarrhea.  Infectious work-up was unrevealing.  Developed partial small bowel obstruction versus ileus treated conservatively.  Ileus resolved.  Stool burden resolved.  Now with low volume liquid stool, diet being tolerated well.  Hospitalization uncomplicated.  Stable for discharge.  Individual issues as below.  Partial small bowel obstruction versus ileus, with associated intractable nausea and vomiting.  Then found to have partial small bowel obstruction versus ileus 6/16.  Small bowel protocol initiated with improvement. --imaging 6/18  showed resolution of small bowel obstruction.  Follow-up imaging 6/23 showed resolution of high volume stool burden, no obstructive features. --Tolerating diet.  Diarrhea has slowed down.  Utilize Imodium for the next day or 2.  Diarrhea.  C. difficile negative.  GI pathogen panel negative --Slowing down.  X-ray unremarkable.  Had been on Colace, complicating issue.  Repeat abdominal film reassuring.  Imodium for the next day or 2.  Monitor for constipation.  Acute metabolic encephalopathy superimposed on PMH anoxic brain injury, mental retardation.  Thought secondary to dehydration and acute kidney injury. --Resolved  Acute kidney injury with associated hyperkalemia, secondary to stool output resulting in dehydration. --Losartan held.  Can be resumed on discharge. Hyperkalemia treated with Lokelma. --Acute kidney injury resolved.  Possible syncope, hypotension on admission.  Not clear what happened but suspect vasovagal syncope from dehydration. --No further evaluation suggested.  Seizure disorder.  Tegretol level therapeutic. --Continue Tegretol gabapentin, and Keppra  Today's assessment: S: better today, ate breakfast and eating [employer] for lunch, fries and milkshake.  1 watery stool today.  No complaints. O: Vitals:  Vitals:   05/27/20 0916 05/27/20 1244  BP: 119/67 93/81  Pulse: 87 99  Resp: 16 16  Temp: 99.2 F (37.3 C) 99.3 F (37.4 C)  SpO2: 95% 96%    Constitutional:  . Appears calm and comfortable eating lunch milkshake and fries Respiratory:  . CTA bilaterally, no w/r/r.  . Respiratory effort normal. Cardiovascular:  . RRR, no m/r/g Abdomen:  . Soft Psychiatric:  . Mental status o Mood, affect appropriate  Magnesium 1.8.  Discharge Instructions  Discharge Instructions    Diet - low sodium heart healthy   Complete by: As directed    Increase activity slowly   Complete  by: As directed      Allergies as of 05/27/2020      Reactions   Seasonal  Ic [cholestatin]    Runny nose      Medication List    STOP taking these medications   mometasone 220 MCG/INH inhaler Commonly known as: Asmanex (60 Metered Doses)   polyethylene glycol powder 17 GM/SCOOP powder Commonly known as: GLYCOLAX/MIRALAX     TAKE these medications   atorvastatin 10 MG tablet Commonly known as: LIPITOR Take 10 mg by mouth every Monday, Wednesday, and Friday.   Benefiber Chew Chew 1 tablet by mouth daily.   busPIRone 15 MG tablet Commonly known as: BUSPAR Take 15 mg by mouth 2 (two) times daily.   calcium carbonate 600 MG Tabs tablet Commonly known as: OS-CAL Take 600 mg by mouth 3 (three) times daily with meals. Take by mouth.   carbamazepine 100 MG 12 hr tablet Commonly known as: TEGRETOL XR Take 3 tablets (300 mg total) by mouth 2 (two) times daily. Reducing due to low sodium levels recently. What changed: additional instructions   CertaVite/Antioxidants Tabs Take 1 tablet by mouth daily. Advance , daily   diclofenac 75 MG EC tablet Commonly known as: VOLTAREN Take 75 mg by mouth 2 (two) times daily.   gabapentin 300 MG capsule Commonly known as: NEURONTIN Take 1 capsule (300 mg total) by mouth in the morning, at noon, and at bedtime. Taken tid on weekends, taken qid on weekdays.   levETIRAcetam 500 MG tablet Commonly known as: KEPPRA Take 1 tablet (500 mg total) by mouth 2 (two) times daily.   loperamide 2 MG capsule Commonly known as: IMODIUM Take 1 capsule (2 mg total) by mouth as needed for up to 2 days for diarrhea or loose stools.   pantoprazole 40 MG tablet Commonly known as: PROTONIX Take 40 mg by mouth daily.   Pulmicort Flexhaler 180 MCG/ACT inhaler Generic drug: budesonide Inhale 1 puff into the lungs 2 (two) times daily.   Selenium 200 MCG Tbcr Take 1 tablet by mouth daily.   Vitamin D3 50 MCG (2000 UT) Tabs Take 2,000 Units by mouth daily.            Durable Medical Equipment  (From admission, onward)           Start     Ordered   05/24/20 1140  For home use only DME Hospital bed  Once       Question Answer Comment  Length of Need Lifetime   The above medical condition requires: Patient requires the ability to reposition frequently   Head must be elevated greater than: 30 degrees   Bed type Semi-electric   Hoyer Lift Yes   Support Surface: Gel Overlay      05/24/20 1140         Allergies  Allergen Reactions  . Seasonal Ic [Cholestatin]     Runny nose    The results of significant diagnostics from this hospitalization (including imaging, microbiology, ancillary and laboratory) are listed below for reference.    Significant Diagnostic Studies: DG CHEST PORT 1 VIEW  Result Date: 05/19/2020 CLINICAL DATA:  Nasogastric tube placement. EXAM: PORTABLE CHEST 1 VIEW COMPARISON:  Same day. FINDINGS: The heart size and mediastinal contours are within normal limits. Stable bibasilar atelectasis or infiltrates are noted. Nasogastric tube tip is seen in the expected position of the proximal stomach. The visualized skeletal structures are unremarkable. IMPRESSION: Nasogastric tube tip seen in expected position of the proximal  stomach. Electronically Signed   By: Lupita Raider M.D.   On: 05/19/2020 14:11   DG CHEST PORT 1 VIEW  Result Date: 05/19/2020 CLINICAL DATA:  Cough and shortness of breath EXAM: PORTABLE CHEST 1 VIEW COMPARISON:  February 06, 2013 FINDINGS: There is a shallow degree of inspiration. There is ill-defined airspace opacity in the lung bases, concerning for a degree of pneumonia in the bases. Lungs elsewhere clear. Heart size and pulmonary vascular normal. No adenopathy. There is arthropathy in the left shoulder. IMPRESSION: Shallow degree of inspiration with ill-defined airspace opacity concerning for pneumonia in the bases. The degree of shallow inspiration raises concern for a degree of underlying restrictive disease. Lungs elsewhere clear.  Cardiac silhouette normal.  No  adenopathy. Electronically Signed   By: Bretta Bang III M.D.   On: 05/19/2020 12:00   DG Abd Portable 1V  Result Date: 05/26/2020 CLINICAL DATA:  Evaluate stool burden EXAM: PORTABLE ABDOMEN - 1 VIEW COMPARISON:  05/21/2020 FINDINGS: Contrast material is noted within the colon but to a lesser degree than that seen on the prior exam. Mild retained fecal material is noted. No obstructive changes are seen. Stomach is distended with air. IMPRESSION: Mild retained fecal material.  No obstructive changes are noted. Electronically Signed   By: Alcide Clever M.D.   On: 05/26/2020 15:26   DG Abd Portable 1V-Small Bowel Obstruction Protocol-initial, 8 hr delay  Result Date: 05/21/2020 CLINICAL DATA:  69 year old female small-bowel obstruction protocol, 8 hours post contrast. EXAM: PORTABLE ABDOMEN - 1 VIEW COMPARISON:  05/20/2020 and earlier. FINDINGS: AP view at 0232 hours. Stable enteric tube in the left upper quadrant. Administered oral contrast is predominantly in large bowel, from the cecum to the descending colon. Gas-filled bowel loop now projecting over the mid lower abdomen is probably redundant sigmoid. Visible small bowel loops appear within normal limits. IMPRESSION: Administered oral contrast is primarily throughout the large intestine. No convincing small bowel obstruction. Electronically Signed   By: Odessa Fleming M.D.   On: 05/21/2020 04:38   DG Abd Portable 1V  Result Date: 05/20/2020 CLINICAL DATA:  Abdominal distension several days. EXAM: PORTABLE ABDOMEN - 1 VIEW COMPARISON:  05/19/2020 FINDINGS: Interval improvement and bowel gas pattern with no significantly dilated small bowel loops on the current exam. No free peritoneal air. Air is present throughout the colon. Mild fecal retention over the rectum. Nasogastric tube has tip over the stomach in the left upper quadrant. Remainder of the exam is unchanged. IMPRESSION: Interval improvement with no significant dilated small bowel loops on the  current exam. Nasogastric tube with tip over the stomach in the left upper quadrant. Electronically Signed   By: Elberta Fortis M.D.   On: 05/20/2020 10:27   DG Abd Portable 1V  Result Date: 05/19/2020 CLINICAL DATA:  Diarrhea. EXAM: PORTABLE ABDOMEN - 1 VIEW COMPARISON:  None. FINDINGS: Stomach is nondistended. There are multiple dilated loops of small bowel identified within the left abdomen measuring up to 3.1 cm. A large stool burden is noted involving the right colon. IMPRESSION: 1. Dilated loops of small bowel within the left abdomen compatible which may reflect partial small bowel obstruction or small-bowel ileus. 2. Large stool burden within the right colon. Electronically Signed   By: Signa Kell M.D.   On: 05/19/2020 11:41    Microbiology: Recent Results (from the past 240 hour(s))  Gastrointestinal Panel by PCR , Stool     Status: None   Collection Time: 05/17/20  3:22 PM  Specimen: Stool  Result Value Ref Range Status   Campylobacter species NOT DETECTED NOT DETECTED Final   Plesimonas shigelloides NOT DETECTED NOT DETECTED Final   Salmonella species NOT DETECTED NOT DETECTED Final   Yersinia enterocolitica NOT DETECTED NOT DETECTED Final   Vibrio species NOT DETECTED NOT DETECTED Final   Vibrio cholerae NOT DETECTED NOT DETECTED Final   Enteroaggregative E coli (EAEC) NOT DETECTED NOT DETECTED Final   Enteropathogenic E coli (EPEC) NOT DETECTED NOT DETECTED Final   Enterotoxigenic E coli (ETEC) NOT DETECTED NOT DETECTED Final   Shiga like toxin producing E coli (STEC) NOT DETECTED NOT DETECTED Final   Shigella/Enteroinvasive E coli (EIEC) NOT DETECTED NOT DETECTED Final   Cryptosporidium NOT DETECTED NOT DETECTED Final   Cyclospora cayetanensis NOT DETECTED NOT DETECTED Final   Entamoeba histolytica NOT DETECTED NOT DETECTED Final   Giardia lamblia NOT DETECTED NOT DETECTED Final   Adenovirus F40/41 NOT DETECTED NOT DETECTED Final   Astrovirus NOT DETECTED NOT DETECTED  Final   Norovirus GI/GII NOT DETECTED NOT DETECTED Final   Rotavirus A NOT DETECTED NOT DETECTED Final   Sapovirus (I, II, IV, and V) NOT DETECTED NOT DETECTED Final    Comment: Performed at [facility], [address] [place], [place] [address]  C Difficile Quick Screen w PCR reflex     Status: None   Collection Time: 05/17/20  3:22 PM   Specimen: STOOL  Result Value Ref Range Status   C Diff antigen NEGATIVE NEGATIVE Final   C Diff toxin NEGATIVE NEGATIVE Final   C Diff interpretation No C. difficile detected.  Final    Comment: Performed at [facility], [place] [address], [place], [place] [address]  SARS Coronavirus 2 by RT PCR (hospital order, performed in [facility] hospital lab) Nasopharyngeal Nasopharyngeal Swab     Status: None   Collection Time: 05/17/20  6:52 PM   Specimen: Nasopharyngeal Swab  Result Value Ref Range Status   SARS Coronavirus 2 NEGATIVE NEGATIVE Final    Comment: (NOTE) SARS-CoV-2 target nucleic acids are NOT DETECTED.  The SARS-CoV-2 RNA is generally detectable in upper and lower respiratory specimens during the acute phase of infection. The lowest concentration of SARS-CoV-2 viral copies this assay can detect is 250 copies / mL. A negative result does not preclude SARS-CoV-2 infection and should not be used as the sole basis for treatment or other patient management decisions.  A negative result may occur with improper specimen collection / handling, submission of specimen other than nasopharyngeal swab, presence of viral mutation(s) within the areas targeted by this assay, and inadequate number of viral copies (<250 copies / mL). A negative result must be combined with clinical observations, patient history, and epidemiological information.  Fact Sheet for Patients:   StrictlyIdeas.no  Fact Sheet for Healthcare Providers: BankingDealers.co.za  This test is not yet approved  or  cleared by the [place] FDA and has been authorized for detection and/or diagnosis of SARS-CoV-2 by FDA under an Emergency Use Authorization (EUA).  This EUA will remain in effect (meaning this test can be used) for the duration of the COVID-19 declaration under Section 564(b)(1) of the Act, 21 U.S.C. section 360bbb-3(b)(1), unless the authorization is terminated or revoked sooner.  Performed at [facility], [place] [address] [place], [place] [address]   MRSA PCR Screening     Status: Abnormal   Collection Time: 05/18/20  6:59 AM   Specimen: Nasopharyngeal  Result Value Ref  Range Status   MRSA by PCR POSITIVE (A) NEGATIVE Final    Comment:        The GeneXpert MRSA Assay (FDA approved for NASAL specimens only), is one component of a comprehensive MRSA colonization surveillance program. It is not intended to diagnose MRSA infection nor to guide or monitor treatment for MRSA infections. RESULT CALLED TO, READ BACK BY AND VERIFIED WITH: BULLINS,M. RN @1428  05/18/20 BILLINGSLEY,L Performed at [facility], [address] [place], [place]      Labs: Basic Metabolic Panel: Recent Labs  Lab 05/23/20 0402 05/23/20 0402 05/24/20 0425 05/25/20 0424 05/25/20 1442 05/26/20 0418 05/27/20 0424  NA 141  --  137 137 138 134*  --   K 3.1*  --  3.5 3.2* 3.2* 3.2*  --   CL 104  --  104 103 100 97*  --   CO2 24  --  21* 25 28 27   --   GLUCOSE 114*  --  121* 120* 145* 106*  --   BUN 18  --  16 15 14 14   --   CREATININE 0.51  --  0.51 0.48 0.75 0.61  --   CALCIUM 8.6*  --  8.2* 8.2* 8.3* 7.4*  --   MG 2.0   < > 1.7 1.7 1.6* 2.0 1.8   < > = values in this interval not displayed.   Liver Function Tests: Recent Labs  Lab 05/26/20 0418  AST 60*  ALT 37  ALKPHOS 45  BILITOT 0.8  PROT 5.8*  ALBUMIN 1.8*   CBC: Recent Labs  Lab 05/22/20 0507 05/23/20 0402 05/24/20 0425 05/25/20 0424 05/26/20 0418  WBC 5.7 8.7 7.9 7.3  6.5  NEUTROABS  --   --   --   --  4.9  HGB 10.5* 10.2* 10.2* 10.1* 9.6*  HCT 31.5* 31.2* 30.4* 30.7* 29.3*  MCV 98.7 100.3* 98.7 98.7 99.3  PLT 239 266 275 293 275    Principal Problem:   Diarrhea Active Problems:   Allergic rhinitis   Hyperlipidemia   Hypertension   Hypotension   Seizures (HCC)   AKI (acute kidney injury) (HCC)   Dehydration   Ileus (HCC)   Time coordinating discharge: 35 minutes  Signed:  05/26/20, MD  [facility]  05/27/2020, 1:03 PM

## 2020-05-27 NOTE — Care Management Important Message (Signed)
Important Message  Patient Details IM Letter given to Ezekiel Ina RN Case Manager to present to the Patient Name: Nicole [name] MRN: [id number] Date of Birth: 11-16-1951   Medicare Important Message Given:  Yes     Caren Macadam 05/27/2020, 11:10 AM

## 2020-05-27 NOTE — Progress Notes (Signed)
Report called to April [phone], manger for Group Home, reviewed plan of care and addressed questions. Home Health aide added to [facility] orders. Also reviewed discharge plan with pt and sister. Acknowledged understanding. SRP,RN

## 2020-05-27 NOTE — TOC Progression Note (Addendum)
Transition of Care [facility]) - Progression Note    Patient Details  Name: CERIA SUMINSKI MRN: [id number] Date of Birth: [birth year]  Transition of [facility] CM/SW Contact  Geni Bers, RN Phone Number: 05/27/2020, 3:54 PM  Clinical Narrative:    [facility] Group home asked for [facility]. [facility] in house rep Kenard Gower was called. [facility] was added.    Expected Discharge Plan: Group Home Barriers to Discharge: Continued Medical Work up  Expected Discharge Plan and Services Expected Discharge Plan: Group Home In-house Referral: Clinical Social Work Discharge Planning Services: CM Consult Post Acute Care Choice: Home Health, Skilled Nursing Facility Living arrangements for the past 2 months: Group Home ([facility] group [facility]) Expected Discharge Date: 05/27/20               DME Arranged: Hospital bed (hoyer lift) DME Agency: [employer] Date DME Agency Contacted: 05/24/20 Time DME Agency Contacted: 1302 Representative spoke with at DME Agency: Ian Malkin HH Arranged: PT, OT HH Agency: [facility] Date [facility] Agency Contacted: 05/24/20 Time HH Agency Contacted: 1303 Representative spoke with at [facility] Agency: Becky Sax   Social Determinants of Health (SDOH) Interventions    Readmission Risk Interventions No flowsheet data found.

## 2020-05-30 ENCOUNTER — Inpatient Hospital Stay (HOSPITAL_COMMUNITY)
Admission: AD | Admit: 2020-05-30 | Discharge: 2020-06-17 | DRG: 871 | Disposition: A | Discharge status: Home Health | Payer: Medicare Other | Attending: Internal Medicine | Admitting: Internal Medicine

## 2020-05-30 ENCOUNTER — Other Ambulatory Visit: Payer: Self-pay

## 2020-05-30 ENCOUNTER — Encounter (HOSPITAL_COMMUNITY): Payer: Self-pay | Admitting: Emergency Medicine

## 2020-05-30 DIAGNOSIS — R0902 Hypoxemia: Secondary | ICD-10-CM

## 2020-05-30 DIAGNOSIS — R6521 Severe sepsis with septic shock: Secondary | ICD-10-CM | POA: Diagnosis present

## 2020-05-30 DIAGNOSIS — R197 Diarrhea, unspecified: Secondary | ICD-10-CM

## 2020-05-30 DIAGNOSIS — L0390 Cellulitis, unspecified: Secondary | ICD-10-CM | POA: Diagnosis present

## 2020-05-30 DIAGNOSIS — K633 Ulcer of intestine: Secondary | ICD-10-CM | POA: Diagnosis present

## 2020-05-30 DIAGNOSIS — L89611 Pressure ulcer of right heel, stage 1: Secondary | ICD-10-CM | POA: Diagnosis present

## 2020-05-30 DIAGNOSIS — E871 Hypo-osmolality and hyponatremia: Secondary | ICD-10-CM | POA: Diagnosis present

## 2020-05-30 DIAGNOSIS — G40909 Epilepsy, unspecified, not intractable, without status epilepticus: Secondary | ICD-10-CM | POA: Diagnosis present

## 2020-05-30 DIAGNOSIS — L89620 Pressure ulcer of left heel, unstageable: Secondary | ICD-10-CM | POA: Diagnosis present

## 2020-05-30 DIAGNOSIS — K219 Gastro-esophageal reflux disease without esophagitis: Secondary | ICD-10-CM | POA: Diagnosis present

## 2020-05-30 DIAGNOSIS — R531 Weakness: Secondary | ICD-10-CM

## 2020-05-30 DIAGNOSIS — M4646 Discitis, unspecified, lumbar region: Secondary | ICD-10-CM | POA: Diagnosis present

## 2020-05-30 DIAGNOSIS — R1110 Vomiting, unspecified: Secondary | ICD-10-CM

## 2020-05-30 DIAGNOSIS — D6489 Other specified anemias: Secondary | ICD-10-CM | POA: Diagnosis present

## 2020-05-30 DIAGNOSIS — I82411 Acute embolism and thrombosis of right femoral vein: Secondary | ICD-10-CM | POA: Diagnosis not present

## 2020-05-30 DIAGNOSIS — E860 Dehydration: Secondary | ICD-10-CM | POA: Diagnosis present

## 2020-05-30 DIAGNOSIS — I6930 Unspecified sequelae of cerebral infarction: Secondary | ICD-10-CM

## 2020-05-30 DIAGNOSIS — Z7951 Long term (current) use of inhaled steroids: Secondary | ICD-10-CM

## 2020-05-30 DIAGNOSIS — R4182 Altered mental status, unspecified: Secondary | ICD-10-CM | POA: Diagnosis present

## 2020-05-30 DIAGNOSIS — J90 Pleural effusion, not elsewhere classified: Secondary | ICD-10-CM | POA: Diagnosis present

## 2020-05-30 DIAGNOSIS — E8809 Other disorders of plasma-protein metabolism, not elsewhere classified: Secondary | ICD-10-CM | POA: Diagnosis present

## 2020-05-30 DIAGNOSIS — K529 Noninfective gastroenteritis and colitis, unspecified: Secondary | ICD-10-CM | POA: Diagnosis present

## 2020-05-30 DIAGNOSIS — R112 Nausea with vomiting, unspecified: Secondary | ICD-10-CM

## 2020-05-30 DIAGNOSIS — R6250 Unspecified lack of expected normal physiological development in childhood: Secondary | ICD-10-CM | POA: Diagnosis present

## 2020-05-30 DIAGNOSIS — K623 Rectal prolapse: Secondary | ICD-10-CM | POA: Diagnosis present

## 2020-05-30 DIAGNOSIS — Z8669 Personal history of other diseases of the nervous system and sense organs: Secondary | ICD-10-CM

## 2020-05-30 DIAGNOSIS — N83201 Unspecified ovarian cyst, right side: Secondary | ICD-10-CM | POA: Diagnosis present

## 2020-05-30 DIAGNOSIS — Z66 Do not resuscitate: Secondary | ICD-10-CM | POA: Diagnosis not present

## 2020-05-30 DIAGNOSIS — F419 Anxiety disorder, unspecified: Secondary | ICD-10-CM | POA: Diagnosis present

## 2020-05-30 DIAGNOSIS — K649 Unspecified hemorrhoids: Secondary | ICD-10-CM | POA: Diagnosis present

## 2020-05-30 DIAGNOSIS — K5900 Constipation, unspecified: Secondary | ICD-10-CM | POA: Diagnosis present

## 2020-05-30 DIAGNOSIS — N179 Acute kidney failure, unspecified: Secondary | ICD-10-CM | POA: Diagnosis present

## 2020-05-30 DIAGNOSIS — Z91199 Patient's noncompliance with other medical treatment and regimen due to unspecified reason: Secondary | ICD-10-CM

## 2020-05-30 DIAGNOSIS — M4640 Discitis, unspecified, site unspecified: Secondary | ICD-10-CM

## 2020-05-30 DIAGNOSIS — R569 Unspecified convulsions: Secondary | ICD-10-CM

## 2020-05-30 DIAGNOSIS — E876 Hypokalemia: Secondary | ICD-10-CM | POA: Diagnosis present

## 2020-05-30 DIAGNOSIS — I69354 Hemiplegia and hemiparesis following cerebral infarction affecting left non-dominant side: Secondary | ICD-10-CM

## 2020-05-30 DIAGNOSIS — D62 Acute posthemorrhagic anemia: Secondary | ICD-10-CM | POA: Diagnosis present

## 2020-05-30 DIAGNOSIS — M4800 Spinal stenosis, site unspecified: Secondary | ICD-10-CM | POA: Diagnosis present

## 2020-05-30 DIAGNOSIS — M4626 Osteomyelitis of vertebra, lumbar region: Secondary | ICD-10-CM | POA: Diagnosis present

## 2020-05-30 DIAGNOSIS — D539 Nutritional anemia, unspecified: Secondary | ICD-10-CM | POA: Diagnosis present

## 2020-05-30 DIAGNOSIS — A419 Sepsis, unspecified organism: Secondary | ICD-10-CM | POA: Diagnosis not present

## 2020-05-30 DIAGNOSIS — K567 Ileus, unspecified: Secondary | ICD-10-CM | POA: Diagnosis present

## 2020-05-30 DIAGNOSIS — L03116 Cellulitis of left lower limb: Secondary | ICD-10-CM

## 2020-05-30 DIAGNOSIS — R739 Hyperglycemia, unspecified: Secondary | ICD-10-CM | POA: Diagnosis present

## 2020-05-30 DIAGNOSIS — I2510 Atherosclerotic heart disease of native coronary artery without angina pectoris: Secondary | ICD-10-CM | POA: Diagnosis present

## 2020-05-30 DIAGNOSIS — K559 Vascular disorder of intestine, unspecified: Secondary | ICD-10-CM | POA: Diagnosis present

## 2020-05-30 DIAGNOSIS — E875 Hyperkalemia: Secondary | ICD-10-CM | POA: Diagnosis not present

## 2020-05-30 DIAGNOSIS — M810 Age-related osteoporosis without current pathological fracture: Secondary | ICD-10-CM | POA: Diagnosis present

## 2020-05-30 DIAGNOSIS — I10 Essential (primary) hypertension: Secondary | ICD-10-CM | POA: Diagnosis present

## 2020-05-30 DIAGNOSIS — L8990 Pressure ulcer of unspecified site, unspecified stage: Secondary | ICD-10-CM | POA: Clinically undetermined

## 2020-05-30 DIAGNOSIS — Z515 Encounter for palliative care: Secondary | ICD-10-CM

## 2020-05-30 DIAGNOSIS — I2699 Other pulmonary embolism without acute cor pulmonale: Secondary | ICD-10-CM | POA: Diagnosis not present

## 2020-05-30 DIAGNOSIS — A09 Infectious gastroenteritis and colitis, unspecified: Secondary | ICD-10-CM | POA: Diagnosis present

## 2020-05-30 DIAGNOSIS — Z9189 Other specified personal risk factors, not elsewhere classified: Secondary | ICD-10-CM

## 2020-05-30 DIAGNOSIS — Z09 Encounter for follow-up examination after completed treatment for conditions other than malignant neoplasm: Secondary | ICD-10-CM

## 2020-05-30 DIAGNOSIS — Z22322 Carrier or suspected carrier of Methicillin resistant Staphylococcus aureus: Secondary | ICD-10-CM

## 2020-05-30 DIAGNOSIS — Z20822 Contact with and (suspected) exposure to covid-19: Secondary | ICD-10-CM | POA: Diagnosis present

## 2020-05-30 DIAGNOSIS — D638 Anemia in other chronic diseases classified elsewhere: Secondary | ICD-10-CM | POA: Diagnosis present

## 2020-05-30 DIAGNOSIS — I82401 Acute embolism and thrombosis of unspecified deep veins of right lower extremity: Secondary | ICD-10-CM | POA: Clinically undetermined

## 2020-05-30 DIAGNOSIS — K56600 Partial intestinal obstruction, unspecified as to cause: Secondary | ICD-10-CM | POA: Diagnosis present

## 2020-05-30 DIAGNOSIS — L03115 Cellulitis of right lower limb: Secondary | ICD-10-CM | POA: Diagnosis present

## 2020-05-30 DIAGNOSIS — I700 Atherosclerosis of aorta: Secondary | ICD-10-CM | POA: Diagnosis present

## 2020-05-30 DIAGNOSIS — Z79899 Other long term (current) drug therapy: Secondary | ICD-10-CM

## 2020-05-30 DIAGNOSIS — F71 Moderate intellectual disabilities: Secondary | ICD-10-CM | POA: Diagnosis present

## 2020-05-30 DIAGNOSIS — E785 Hyperlipidemia, unspecified: Secondary | ICD-10-CM | POA: Diagnosis present

## 2020-05-30 DIAGNOSIS — Z803 Family history of malignant neoplasm of breast: Secondary | ICD-10-CM

## 2020-05-30 LAB — COMPREHENSIVE METABOLIC PANEL
ALT: 27 U/L (ref 0–44)
AST: 37 U/L (ref 15–41)
Albumin: 1.6 g/dL — ABNORMAL LOW (ref 3.5–5.0)
Alkaline Phosphatase: 47 U/L (ref 38–126)
Anion gap: 10 (ref 5–15)
BUN: 14 mg/dL (ref 8–23)
CO2: 30 mmol/L (ref 22–32)
Calcium: 8.8 mg/dL — ABNORMAL LOW (ref 8.9–10.3)
Chloride: 96 mmol/L — ABNORMAL LOW (ref 98–111)
Creatinine, Ser: 0.73 mg/dL (ref 0.44–1.00)
GFR calc Af Amer: >60 mL/min (ref >60)
GFR calc non Af Amer: >60 mL/min (ref >60)
Glucose, Bld: 113 mg/dL — ABNORMAL HIGH (ref 70–99)
Potassium: 3.4 mmol/L — ABNORMAL LOW (ref 3.5–5.1)
Sodium: 136 mmol/L (ref 135–145)
Total Bilirubin: 0.3 mg/dL (ref 0.3–1.2)
Total Protein: 5.3 g/dL — ABNORMAL LOW (ref 6.5–8.1)

## 2020-05-30 LAB — LACTIC ACID, PLASMA: Lactic Acid, Venous: 1.9 mmol/L (ref 0.5–1.9)

## 2020-05-30 MED ORDER — CEFAZOLIN SODIUM-DEXTROSE 1-4 GM/50ML-% IV SOLN
1.0000 g | Freq: Once | INTRAVENOUS | Status: AC
  Administered 2020-05-30: 1 g via INTRAVENOUS
  Filled 2020-05-30: qty 50

## 2020-05-30 MED ORDER — SODIUM CHLORIDE 0.9 % IV BOLUS
1000.0000 mL | Freq: Once | INTRAVENOUS | Status: AC
  Administered 2020-05-30: 1000 mL via INTRAVENOUS

## 2020-05-30 NOTE — ED Provider Notes (Signed)
[facility]   CSN: [id number] Arrival date & time: 05/30/20  2215     History Chief Complaint  Patient presents with  . Diarrhea    Nicole BlockerConnie G [name] is a 69 y.o. female.  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 Per sister, she has had a slow decline in physical functioning over the last 3 months that has accelerated during this illness. Per chart review, the patient was found to have SBO vs ileus during recent admission, resolved with NG tube and progressed to eating without vomiting, had negative stool cultures, including C.diff, and was discharged back to the group home. Sister states that at the time of discharge she was still took weak to perform ADL's and this weakness has persisted/worsened. No vomiting. The patient denies pain. Stools have remained nonbloody.  The history is provided by the patient and a relative. No language interpreter was used.  Diarrhea Associated symptoms: no abdominal pain, no chills, no fever and no vomiting        Past Medical History:  Diagnosis Date  . 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 Allergic rhinitis   . Alopecia   . Discoid lupus   . Hyperlipidemia   . Hypertension   . Menopause   . Moderate mental retardation   . Obesity   . Osteoporosis   . Pulmonary nodules   . Seizures (HCC)    focal-being by Dr. Sandria ManlyLove (should not take anti-histamines)    Patient Active Problem List   Diagnosis Date Noted  . Ileus (HCC) 05/26/2020  . Altered mental status 05/17/2020  . Hyperlipidemia   . Hypertension   . Hypotension   . Seizures (HCC)   . AKI (acute kidney injury) (HCC)   . Dehydration   . Diarrhea   . Oral thrush 02/28/2012  . ALOPECIA 01/04/2010  .  Allergic rhinitis 07/17/2008  . PULMONARY NODULE 07/17/2008  . ENLARGEMENT OF LYMPH NODES 07/17/2008  . Cough 07/17/2008    Past Surgical History:  Procedure Laterality Date  . VESICOVAGINAL FISTULA CLOSURE W/ TAH       OB History   No obstetric history on file.     Family History  Problem Relation Age of Onset  . Breast cancer Mother     Social History   Tobacco Use  . Smoking status: Never Smoker  . Smokeless tobacco: Never Used  Substance Use Topics  . Alcohol use: No  . Drug use: No    Home Medications Prior to Admission medications   Medication Sig Start Date End Date Taking? Authorizing Provider  atorvastatin (LIPITOR) 10 MG tablet Take 10 mg by mouth every Monday, Wednesday, and Friday.  12/08/19   [provider]  budesonide (PULMICORT FLEXHALER) 180 MCG/ACT inhaler Inhale 1 puff into the lungs 2 (two) times daily. 09/10/19   [provider]  busPIRone (BUSPAR) 15 MG tablet Take 15 mg by mouth 2 (two) times daily.      [provider]  calcium carbonate (OS-CAL) 600 MG TABS tablet Take 600 mg by mouth 3 (three) times daily with meals. Take by mouth.    [provider]  carbamazepine (TEGRETOL XR) 100 MG 12 hr tablet Take 3 tablets (300 mg total) by mouth 2 (two) times daily.  Reducing due to low sodium levels recently. Patient taking differently: Take 300 mg by mouth 2 (two) times daily.  04/10/18   Star Age, MD  Cholecalciferol (VITAMIN D3) 2000 units TABS Take 2,000 Units by mouth daily.     [provider]  diclofenac (VOLTAREN) 75 MG EC tablet Take 75 mg by mouth 2 (two) times daily.  07/28/16   [provider]  gabapentin (NEURONTIN) 300 MG capsule Take 1 capsule (300 mg total) by mouth in the morning, at noon, and at bedtime. Taken tid on weekends, taken qid on weekdays. 05/27/20   Samuella Cota, MD  levETIRAcetam (KEPPRA) 500 MG tablet Take 1 tablet (500 mg total) by mouth 2 (two) times daily. 04/10/18    Star Age, MD  Multiple Vitamins-Minerals (CERTAVITE/ANTIOXIDANTS) TABS Take 1 tablet by mouth daily. Advance , daily     [provider]  pantoprazole (PROTONIX) 40 MG tablet Take 40 mg by mouth daily.  08/04/13   [provider]  Selenium 200 MCG TBCR Take 1 tablet by mouth daily.    [provider]  Wheat Dextrin (BENEFIBER) CHEW Chew 1 tablet by mouth daily.     [provider]    Allergies    Seasonal ic [cholestatin]  Review of Systems   Review of Systems  Constitutional: Negative for chills and fever.  HENT: Negative.   Respiratory: Negative.  Negative for cough and shortness of breath.   Cardiovascular: Negative.  Negative for chest pain.  Gastrointestinal: Positive for diarrhea. Negative for abdominal pain and vomiting.  Musculoskeletal: Negative.   Skin: Negative.   Neurological: Negative.     Physical Exam Updated Vital Signs BP (!) 97/43   Pulse (!) 103   Temp (!) 100.9 F (38.3 C) (Oral)   Resp 18   SpO2 99%   Physical Exam Vitals and nursing note reviewed.  Constitutional:      General: She is not in acute distress.    Appearance: She is normal weight. She is ill-appearing.  HENT:     Head: Normocephalic and atraumatic.     Mouth/Throat:     Mouth: Mucous membranes are dry.  Cardiovascular:     Rate and Rhythm: Regular rhythm. Tachycardia present.     Pulses: Normal pulses.     Heart sounds: No murmur heard.   Pulmonary:     Effort: Pulmonary effort is normal.     Breath sounds: No wheezing, rhonchi or rales.  Chest:     Chest wall: No tenderness.  Abdominal:     General: Bowel sounds are normal.     Palpations: Abdomen is soft.     Tenderness: There is abdominal tenderness (Left abdominal tenderness to palpation of soft abdomen).  Musculoskeletal:     Cervical back: Normal range of motion and neck supple.     Right lower leg: Edema present.     Comments: Left lower leg and ankle redness that is significantly  warm to touch.   Skin:    General: Skin is warm and dry.  Neurological:     Mental Status: She is alert.     Sensory: No sensory deficit.     Comments: Left paralysis, chronic. Right grip and foot flexion weakness, poor effort.      ED Results / Procedures / Treatments   Labs (all labs ordered are listed, but only abnormal results are displayed) Labs Reviewed - No data to display Results for orders placed or performed during the hospital encounter of  05/30/20  CBC with Differential  Result Value Ref Range   WBC 6.2 4.0 - 10.5 K/uL   RBC 3.06 (L) 3.87 - 5.11 MIL/uL   Hemoglobin 9.7 (L) 12.0 - 15.0 g/dL   HCT 72.5 (L) 36 - 46 %   MCV 101.3 (H) 80.0 - 100.0 fL   MCH 31.7 26.0 - 34.0 pg   MCHC 31.3 30.0 - 36.0 g/dL   RDW 36.6 44.0 - 34.7 %   Platelets 397 150 - 400 K/uL   nRBC 0.0 0.0 - 0.2 %   Neutrophils Relative % 82 %   Neutro Abs 5.1 1.7 - 7.7 K/uL   Lymphocytes Relative 8 %   Lymphs Abs 0.5 (L) 0.7 - 4.0 K/uL   Monocytes Relative 7 %   Monocytes Absolute 0.5 0 - 1 K/uL   Eosinophils Relative 1 %   Eosinophils Absolute 0.0 0 - 0 K/uL   Basophils Relative 1 %   Basophils Absolute 0.0 0 - 0 K/uL   WBC Morphology MILD LEFT SHIFT (1-5% METAS, OCC MYELO, OCC BANDS)    Smear Review      PLATELET CLUMPING, SUGGEST RECOLLECTION OF SAMPLE IN CITRATE TUBE.   Immature Granulocytes 1 %   Abs Immature Granulocytes 0.06 0.00 - 0.07 K/uL  Lactic acid, plasma  Result Value Ref Range   Lactic Acid, Venous 1.9 0.5 - 1.9 mmol/L  Comprehensive metabolic panel  Result Value Ref Range   Sodium 136 135 - 145 mmol/L   Potassium 3.4 (L) 3.5 - 5.1 mmol/L   Chloride 96 (L) 98 - 111 mmol/L   CO2 30 22 - 32 mmol/L   Glucose, Bld 113 (H) 70 - 99 mg/dL   BUN 14 8 - 23 mg/dL   Creatinine, Ser 4.25 0.44 - 1.00 mg/dL   Calcium 8.8 (L) 8.9 - 10.3 mg/dL   Total Protein 5.3 (L) 6.5 - 8.1 g/dL   Albumin 1.6 (L) 3.5 - 5.0 g/dL   AST 37 15 - 41 U/L   ALT 27 0 - 44 U/L   Alkaline Phosphatase 47 38  - 126 U/L   Total Bilirubin 0.3 0.3 - 1.2 mg/dL   GFR calc non Af Amer >60 >60 mL/min   GFR calc Af Amer >60 >60 mL/min   Anion gap 10 5 - 15    EKG None  Radiology No results found. DG CHEST PORT 1 VIEW  Result Date: 05/31/2020 CLINICAL DATA:  Hypoxia EXAM: PORTABLE CHEST 1 VIEW COMPARISON:  None. FINDINGS: The heart size and mediastinal contours are within normal limits. Both lungs are clear. The visualized skeletal structures are unremarkable. IMPRESSION: No acute airspace disease. Electronically Signed   By: Deatra Robinson M.D.   On: 05/31/2020 01:29   DG CHEST PORT 1 VIEW  Result Date: 05/19/2020 CLINICAL DATA:  Nasogastric tube placement. EXAM: PORTABLE CHEST 1 VIEW COMPARISON:  Same day. FINDINGS: The heart size and mediastinal contours are within normal limits. Stable bibasilar atelectasis or infiltrates are noted. Nasogastric tube tip is seen in the expected position of the proximal stomach. The visualized skeletal structures are unremarkable. IMPRESSION: Nasogastric tube tip seen in expected position of the proximal stomach. Electronically Signed   By: Lupita Raider M.D.   On: 05/19/2020 14:11   DG CHEST PORT 1 VIEW  Result Date: 05/19/2020 CLINICAL DATA:  Cough and shortness of breath EXAM: PORTABLE CHEST 1 VIEW COMPARISON:  February 06, 2013 FINDINGS: There is a shallow degree of inspiration. There is ill-defined airspace  opacity in the lung bases, concerning for a degree of pneumonia in the bases. Lungs elsewhere clear. Heart size and pulmonary vascular normal. No adenopathy. There is arthropathy in the left shoulder. IMPRESSION: Shallow degree of inspiration with ill-defined airspace opacity concerning for pneumonia in the bases. The degree of shallow inspiration raises concern for a degree of underlying restrictive disease. Lungs elsewhere clear.  Cardiac silhouette normal.  No adenopathy. Electronically Signed   By: Bretta Bang III M.D.   On: 05/19/2020 12:00   DG Abd  Portable 1V  Result Date: 05/26/2020 CLINICAL DATA:  Evaluate stool burden EXAM: PORTABLE ABDOMEN - 1 VIEW COMPARISON:  05/21/2020 FINDINGS: Contrast material is noted within the colon but to a lesser degree than that seen on the prior exam. Mild retained fecal material is noted. No obstructive changes are seen. Stomach is distended with air. IMPRESSION: Mild retained fecal material.  No obstructive changes are noted. Electronically Signed   By: Alcide Clever M.D.   On: 05/26/2020 15:26   DG Abd Portable 1V-Small Bowel Obstruction Protocol-initial, 8 hr delay  Result Date: 05/21/2020 CLINICAL DATA:  69 year old female small-bowel obstruction protocol, 8 hours post contrast. EXAM: PORTABLE ABDOMEN - 1 VIEW COMPARISON:  05/20/2020 and earlier. FINDINGS: AP view at 0232 hours. Stable enteric tube in the left upper quadrant. Administered oral contrast is predominantly in large bowel, from the cecum to the descending colon. Gas-filled bowel loop now projecting over the mid lower abdomen is probably redundant sigmoid. Visible small bowel loops appear within normal limits. IMPRESSION: Administered oral contrast is primarily throughout the large intestine. No convincing small bowel obstruction. Electronically Signed   By: Odessa Fleming M.D.   On: 05/21/2020 04:38   DG Abd Portable 1V  Result Date: 05/20/2020 CLINICAL DATA:  Abdominal distension several days. EXAM: PORTABLE ABDOMEN - 1 VIEW COMPARISON:  05/19/2020 FINDINGS: Interval improvement and bowel gas pattern with no significantly dilated small bowel loops on the current exam. No free peritoneal air. Air is present throughout the colon. Mild fecal retention over the rectum. Nasogastric tube has tip over the stomach in the left upper quadrant. Remainder of the exam is unchanged. IMPRESSION: Interval improvement with no significant dilated small bowel loops on the current exam. Nasogastric tube with tip over the stomach in the left upper quadrant. Electronically  Signed   By: Elberta Fortis M.D.   On: 05/20/2020 10:27   DG Abd Portable 1V  Result Date: 05/19/2020 CLINICAL DATA:  Diarrhea. EXAM: PORTABLE ABDOMEN - 1 VIEW COMPARISON:  None. FINDINGS: Stomach is nondistended. There are multiple dilated loops of small bowel identified within the left abdomen measuring up to 3.1 cm. A large stool burden is noted involving the right colon. IMPRESSION: 1. Dilated loops of small bowel within the left abdomen compatible which may reflect partial small bowel obstruction or small-bowel ileus. 2. Large stool burden within the right colon. Electronically Signed   By: Signa Kell M.D.   On: 05/19/2020 11:41    Procedures Procedures (including critical care time)  Medications Ordered in ED Medications - No data to display  ED Course  I have reviewed the triage vital signs and the nursing notes.  Pertinent labs & imaging results that were available during my care of the patient were reviewed by me and considered in my medical decision making (see chart for details).    MDM Rules/Calculators/A&P  Patient to ED 3 days after discharge from previous admission for diarrhea, found to be generally weak, febrile, hypotensive on arrival, mildly tachycardic.   On exam, the patient is awake, does not remember the year but remembers facts about her history. She is generally weak on testing. She denies abdominal pain but grimaces with palpation of left abdomen. Labs pending. Concern for LLE redness and warmth c/w cellulitis. Abx started. Cultures pending.  No leukocytosis or lactic acidosis. Hgb of 9.7, stable. Normal renal function. Will obtain CT abdomen given persistent diarrhea, tenderness to exam, recent SBO vs ileus. Feel the patient requires admission for further management of likely LE infection, weakness.   Discussed with Dr[facility] Adela Glimpse of [facility] who accepts the patient for admission.  Final Clinical Impression(s) / ED Diagnoses Final  diagnoses:  None   1. LLE cellulitis 2. Diarrhea, persistent 3. Generalized weakness  Rx / DC Orders ED Discharge Orders    None       Danne Harbor 05/31/20 0149    Mesner, Barbara Cower, MD 05/31/20 [phone]

## 2020-05-30 NOTE — ED Triage Notes (Signed)
Pt here from Group Home via [facility] for diarrhea. Pt had a bowel obstruction and was d/c from [facility] on 6/24. Group Home states shes just been having liquid stools. Pt ao to self and place, disoriented to time. vss

## 2020-05-31 ENCOUNTER — Inpatient Hospital Stay (HOSPITAL_COMMUNITY): Payer: Medicare Other

## 2020-05-31 ENCOUNTER — Emergency Department (HOSPITAL_COMMUNITY): Payer: Medicare Other

## 2020-05-31 DIAGNOSIS — E860 Dehydration: Secondary | ICD-10-CM | POA: Diagnosis not present

## 2020-05-31 DIAGNOSIS — R609 Edema, unspecified: Secondary | ICD-10-CM | POA: Diagnosis not present

## 2020-05-31 DIAGNOSIS — K529 Noninfective gastroenteritis and colitis, unspecified: Secondary | ICD-10-CM | POA: Diagnosis not present

## 2020-05-31 DIAGNOSIS — D6489 Other specified anemias: Secondary | ICD-10-CM | POA: Diagnosis present

## 2020-05-31 DIAGNOSIS — A419 Sepsis, unspecified organism: Principal | ICD-10-CM

## 2020-05-31 DIAGNOSIS — L03116 Cellulitis of left lower limb: Secondary | ICD-10-CM

## 2020-05-31 DIAGNOSIS — R569 Unspecified convulsions: Secondary | ICD-10-CM

## 2020-05-31 DIAGNOSIS — M4626 Osteomyelitis of vertebra, lumbar region: Secondary | ICD-10-CM | POA: Diagnosis present

## 2020-05-31 DIAGNOSIS — I2699 Other pulmonary embolism without acute cor pulmonale: Secondary | ICD-10-CM | POA: Diagnosis not present

## 2020-05-31 DIAGNOSIS — A09 Infectious gastroenteritis and colitis, unspecified: Secondary | ICD-10-CM | POA: Diagnosis not present

## 2020-05-31 DIAGNOSIS — Z20822 Contact with and (suspected) exposure to covid-19: Secondary | ICD-10-CM | POA: Diagnosis not present

## 2020-05-31 DIAGNOSIS — M4646 Discitis, unspecified, lumbar region: Secondary | ICD-10-CM | POA: Diagnosis not present

## 2020-05-31 DIAGNOSIS — L03115 Cellulitis of right lower limb: Secondary | ICD-10-CM | POA: Diagnosis present

## 2020-05-31 DIAGNOSIS — D539 Nutritional anemia, unspecified: Secondary | ICD-10-CM | POA: Diagnosis present

## 2020-05-31 DIAGNOSIS — R531 Weakness: Secondary | ICD-10-CM

## 2020-05-31 DIAGNOSIS — G931 Anoxic brain damage, not elsewhere classified: Secondary | ICD-10-CM | POA: Diagnosis not present

## 2020-05-31 DIAGNOSIS — I10 Essential (primary) hypertension: Secondary | ICD-10-CM

## 2020-05-31 DIAGNOSIS — R6520 Severe sepsis without septic shock: Secondary | ICD-10-CM

## 2020-05-31 DIAGNOSIS — I69354 Hemiplegia and hemiparesis following cerebral infarction affecting left non-dominant side: Secondary | ICD-10-CM | POA: Diagnosis not present

## 2020-05-31 DIAGNOSIS — R6521 Severe sepsis with septic shock: Secondary | ICD-10-CM | POA: Diagnosis not present

## 2020-05-31 DIAGNOSIS — J90 Pleural effusion, not elsewhere classified: Secondary | ICD-10-CM | POA: Diagnosis not present

## 2020-05-31 DIAGNOSIS — L0390 Cellulitis, unspecified: Secondary | ICD-10-CM | POA: Diagnosis present

## 2020-05-31 DIAGNOSIS — D62 Acute posthemorrhagic anemia: Secondary | ICD-10-CM | POA: Diagnosis not present

## 2020-05-31 DIAGNOSIS — D638 Anemia in other chronic diseases classified elsewhere: Secondary | ICD-10-CM | POA: Diagnosis present

## 2020-05-31 DIAGNOSIS — Z66 Do not resuscitate: Secondary | ICD-10-CM | POA: Diagnosis not present

## 2020-05-31 DIAGNOSIS — M4640 Discitis, unspecified, site unspecified: Secondary | ICD-10-CM | POA: Diagnosis not present

## 2020-05-31 DIAGNOSIS — R0902 Hypoxemia: Secondary | ICD-10-CM

## 2020-05-31 DIAGNOSIS — I82411 Acute embolism and thrombosis of right femoral vein: Secondary | ICD-10-CM | POA: Diagnosis not present

## 2020-05-31 DIAGNOSIS — N179 Acute kidney failure, unspecified: Secondary | ICD-10-CM | POA: Diagnosis present

## 2020-05-31 DIAGNOSIS — R197 Diarrhea, unspecified: Secondary | ICD-10-CM | POA: Diagnosis not present

## 2020-05-31 DIAGNOSIS — E871 Hypo-osmolality and hyponatremia: Secondary | ICD-10-CM | POA: Diagnosis not present

## 2020-05-31 DIAGNOSIS — K56600 Partial intestinal obstruction, unspecified as to cause: Secondary | ICD-10-CM | POA: Diagnosis present

## 2020-05-31 DIAGNOSIS — K633 Ulcer of intestine: Secondary | ICD-10-CM | POA: Diagnosis present

## 2020-05-31 DIAGNOSIS — K559 Vascular disorder of intestine, unspecified: Secondary | ICD-10-CM | POA: Diagnosis not present

## 2020-05-31 DIAGNOSIS — E785 Hyperlipidemia, unspecified: Secondary | ICD-10-CM | POA: Diagnosis present

## 2020-05-31 LAB — CBC
HCT: 27.8 % — ABNORMAL LOW (ref 36.0–46.0)
Hemoglobin: 8.8 g/dL — ABNORMAL LOW (ref 12.0–15.0)
MCH: 32.2 pg (ref 26.0–34.0)
MCHC: 31.7 g/dL (ref 30.0–36.0)
MCV: 101.8 fL — ABNORMAL HIGH (ref 80.0–100.0)
Platelets: 369 10*3/uL (ref 150–400)
RBC: 2.73 MIL/uL — ABNORMAL LOW (ref 3.87–5.11)
RDW: 13.2 % (ref 11.5–15.5)
WBC: 8.6 10*3/uL (ref 4.0–10.5)
nRBC: 0 % (ref 0.0–0.2)

## 2020-05-31 LAB — TSH: TSH: 2.336 u[IU]/mL (ref 0.350–4.500)

## 2020-05-31 LAB — CBC WITH DIFFERENTIAL/PLATELET
Abs Immature Granulocytes: 0.06 10*3/uL (ref 0.00–0.07)
Abs Immature Granulocytes: 0 10*3/uL (ref 0.00–0.07)
Basophils Absolute: 0 10*3/uL (ref 0.0–0.1)
Basophils Absolute: 0 10*3/uL (ref 0.0–0.1)
Basophils Relative: 1 %
Basophils Relative: 0 %
Eosinophils Absolute: 0 10*3/uL (ref 0.0–0.5)
Eosinophils Absolute: 0 10*3/uL (ref 0.0–0.5)
Eosinophils Relative: 1 %
Eosinophils Relative: 0 %
HCT: 31 % — ABNORMAL LOW (ref 36.0–46.0)
HCT: 25.4 % — ABNORMAL LOW (ref 36.0–46.0)
Hemoglobin: 9.7 g/dL — ABNORMAL LOW (ref 12.0–15.0)
Hemoglobin: 7.9 g/dL — ABNORMAL LOW (ref 12.0–15.0)
Immature Granulocytes: 1 %
Lymphocytes Relative: 8 %
Lymphocytes Relative: 7 %
Lymphs Abs: 0.5 10*3/uL — ABNORMAL LOW (ref 0.7–4.0)
Lymphs Abs: 0.5 10*3/uL — ABNORMAL LOW (ref 0.7–4.0)
MCH: 31.7 pg (ref 26.0–34.0)
MCH: 31.9 pg (ref 26.0–34.0)
MCHC: 31.3 g/dL (ref 30.0–36.0)
MCHC: 31.1 g/dL (ref 30.0–36.0)
MCV: 101.3 fL — ABNORMAL HIGH (ref 80.0–100.0)
MCV: 102.4 fL — ABNORMAL HIGH (ref 80.0–100.0)
Monocytes Absolute: 0.5 10*3/uL (ref 0.1–1.0)
Monocytes Absolute: 0.3 10*3/uL (ref 0.1–1.0)
Monocytes Relative: 7 %
Monocytes Relative: 4 %
Neutro Abs: 5.1 10*3/uL (ref 1.7–7.7)
Neutro Abs: 6 10*3/uL (ref 1.7–7.7)
Neutrophils Relative %: 82 %
Neutrophils Relative %: 89 %
Platelets: 397 10*3/uL (ref 150–400)
Platelets: 308 10*3/uL (ref 150–400)
RBC: 3.06 MIL/uL — ABNORMAL LOW (ref 3.87–5.11)
RBC: 2.48 MIL/uL — ABNORMAL LOW (ref 3.87–5.11)
RDW: 13.2 % (ref 11.5–15.5)
RDW: 13.2 % (ref 11.5–15.5)
WBC: 6.2 10*3/uL (ref 4.0–10.5)
WBC: 6.7 10*3/uL (ref 4.0–10.5)
nRBC: 0 % (ref 0.0–0.2)
nRBC: 0 % (ref 0.0–0.2)
nRBC: 1 /100 WBC — ABNORMAL HIGH

## 2020-05-31 LAB — URINALYSIS, ROUTINE W REFLEX MICROSCOPIC
Bilirubin Urine: NEGATIVE
Glucose, UA: NEGATIVE mg/dL
Ketones, ur: NEGATIVE mg/dL
Nitrite: NEGATIVE
Protein, ur: NEGATIVE mg/dL
Specific Gravity, Urine: 1.045 — ABNORMAL HIGH (ref 1.005–1.030)
pH: 5 (ref 5.0–8.0)

## 2020-05-31 LAB — COMPREHENSIVE METABOLIC PANEL
ALT: 21 U/L (ref 0–44)
AST: 29 U/L (ref 15–41)
Albumin: 1.5 g/dL — ABNORMAL LOW (ref 3.5–5.0)
Alkaline Phosphatase: 34 U/L — ABNORMAL LOW (ref 38–126)
Anion gap: 8 (ref 5–15)
BUN: 14 mg/dL (ref 8–23)
CO2: 27 mmol/L (ref 22–32)
Calcium: 8.2 mg/dL — ABNORMAL LOW (ref 8.9–10.3)
Chloride: 103 mmol/L (ref 98–111)
Creatinine, Ser: 0.66 mg/dL (ref 0.44–1.00)
GFR calc Af Amer: >60 mL/min (ref >60)
GFR calc non Af Amer: >60 mL/min (ref >60)
Glucose, Bld: 106 mg/dL — ABNORMAL HIGH (ref 70–99)
Potassium: 3.4 mmol/L — ABNORMAL LOW (ref 3.5–5.1)
Sodium: 138 mmol/L (ref 135–145)
Total Bilirubin: 0.5 mg/dL (ref 0.3–1.2)
Total Protein: 4.3 g/dL — ABNORMAL LOW (ref 6.5–8.1)

## 2020-05-31 LAB — PREALBUMIN: Prealbumin: 8 mg/dL — ABNORMAL LOW (ref 18–38)

## 2020-05-31 LAB — CREATININE, SERUM
Creatinine, Ser: 0.74 mg/dL (ref 0.44–1.00)
GFR calc Af Amer: >60 mL/min (ref >60)
GFR calc non Af Amer: >60 mL/min (ref >60)

## 2020-05-31 LAB — MRSA PCR SCREENING
MRSA by PCR: POSITIVE — AB
MRSA by PCR: POSITIVE — AB

## 2020-05-31 LAB — TYPE AND SCREEN
ABO/RH(D): O NEG
Antibody Screen: NEGATIVE

## 2020-05-31 LAB — SARS CORONAVIRUS 2 BY RT PCR (HOSPITAL ORDER, PERFORMED IN ~~LOC~~ HOSPITAL LAB): SARS Coronavirus 2: NEGATIVE

## 2020-05-31 LAB — ABO/RH: ABO/RH(D): O NEG

## 2020-05-31 LAB — LACTIC ACID, PLASMA: Lactic Acid, Venous: 1.2 mmol/L (ref 0.5–1.9)

## 2020-05-31 LAB — HIV ANTIBODY (ROUTINE TESTING W REFLEX): HIV Screen 4th Generation wRfx: NONREACTIVE

## 2020-05-31 LAB — GLUCOSE, CAPILLARY: Glucose-Capillary: 144 mg/dL — ABNORMAL HIGH (ref 70–99)

## 2020-05-31 LAB — FOLATE: Folate: 35.8 ng/mL (ref >5.9)

## 2020-05-31 LAB — CORTISOL: Cortisol, Plasma: 11.5 ug/dL

## 2020-05-31 LAB — VITAMIN B12: Vitamin B-12: 531 pg/mL (ref 180–914)

## 2020-05-31 LAB — PROCALCITONIN: Procalcitonin: 0.95 ng/mL

## 2020-05-31 LAB — PHOSPHORUS: Phosphorus: 3.2 mg/dL (ref 2.5–4.6)

## 2020-05-31 LAB — MAGNESIUM: Magnesium: 1.7 mg/dL (ref 1.7–2.4)

## 2020-05-31 MED ORDER — ALBUMIN HUMAN 25 % IV SOLN
25.0000 g | Freq: Once | INTRAVENOUS | Status: AC
  Administered 2020-05-31: 25 g via INTRAVENOUS
  Filled 2020-05-31: qty 100

## 2020-05-31 MED ORDER — ORAL CARE MOUTH RINSE
15.0000 mL | Freq: Two times a day (BID) | OROMUCOSAL | Status: DC
  Administered 2020-05-31 – 2020-06-16 (×23): 15 mL via OROMUCOSAL

## 2020-05-31 MED ORDER — CARBAMAZEPINE ER 200 MG PO TB12
300.0000 mg | ORAL_TABLET | Freq: Two times a day (BID) | ORAL | Status: DC
  Administered 2020-05-31 – 2020-06-17 (×36): 300 mg via ORAL
  Filled 2020-05-31 (×41): qty 1

## 2020-05-31 MED ORDER — PHENYLEPHRINE HCL-NACL 10-0.9 MG/250ML-% IV SOLN: 0.0000 ug/min | INTRAVENOUS | Status: DC

## 2020-05-31 MED ORDER — IOHEXOL 300 MG/ML  SOLN
100.0000 mL | Freq: Once | INTRAMUSCULAR | Status: AC | PRN
  Administered 2020-05-31: 100 mL via INTRAVENOUS

## 2020-05-31 MED ORDER — BOOST / RESOURCE BREEZE PO LIQD CUSTOM
1.0000 | Freq: Three times a day (TID) | ORAL | Status: DC
  Administered 2020-05-31 – 2020-06-16 (×33): 1 via ORAL

## 2020-05-31 MED ORDER — SODIUM CHLORIDE 0.9 % IV SOLN
250.0000 mL | INTRAVENOUS | Status: DC
  Administered 2020-05-31: 500 mL via INTRAVENOUS

## 2020-05-31 MED ORDER — SODIUM CHLORIDE 0.9 % IV SOLN
250.0000 mL | INTRAVENOUS | Status: DC
  Administered 2020-05-31: 250 mL via INTRAVENOUS
  Administered 2020-05-31: 500 mL via INTRAVENOUS

## 2020-05-31 MED ORDER — ATORVASTATIN CALCIUM 10 MG PO TABS
10.0000 mg | ORAL_TABLET | ORAL | Status: DC
  Administered 2020-05-31 – 2020-06-16 (×9): 10 mg via ORAL
  Filled 2020-05-31 (×13): qty 1

## 2020-05-31 MED ORDER — BUSPIRONE HCL 10 MG PO TABS
15.0000 mg | ORAL_TABLET | Freq: Two times a day (BID) | ORAL | Status: DC
  Administered 2020-05-31: 15 mg via ORAL
  Filled 2020-05-31: qty 2

## 2020-05-31 MED ORDER — ONDANSETRON HCL 4 MG/2ML IJ SOLN
4.0000 mg | Freq: Four times a day (QID) | INTRAMUSCULAR | Status: DC | PRN
  Administered 2020-06-04 – 2020-06-11 (×3): 4 mg via INTRAVENOUS
  Filled 2020-05-31 (×3): qty 2

## 2020-05-31 MED ORDER — SODIUM CHLORIDE 0.9 % IV BOLUS
1000.0000 mL | Freq: Once | INTRAVENOUS | Status: AC
  Administered 2020-05-31: 1000 mL via INTRAVENOUS

## 2020-05-31 MED ORDER — VANCOMYCIN HCL 1750 MG/350ML IV SOLN
1750.0000 mg | Freq: Once | INTRAVENOUS | Status: AC
  Administered 2020-05-31: 1750 mg via INTRAVENOUS
  Filled 2020-05-31 (×2): qty 350

## 2020-05-31 MED ORDER — PIPERACILLIN-TAZOBACTAM 3.375 G IVPB 30 MIN
3.3750 g | Freq: Once | INTRAVENOUS | Status: AC
  Administered 2020-05-31: 3.375 g via INTRAVENOUS
  Filled 2020-05-31: qty 50

## 2020-05-31 MED ORDER — ONDANSETRON HCL 4 MG PO TABS
4.0000 mg | ORAL_TABLET | Freq: Four times a day (QID) | ORAL | Status: DC | PRN
  Administered 2020-06-11: 4 mg via ORAL
  Filled 2020-05-31: qty 1

## 2020-05-31 MED ORDER — LEVETIRACETAM 500 MG PO TABS
500.0000 mg | ORAL_TABLET | Freq: Two times a day (BID) | ORAL | Status: DC
  Administered 2020-05-31 – 2020-06-17 (×36): 500 mg via ORAL
  Filled 2020-05-31 (×37): qty 1

## 2020-05-31 MED ORDER — MAGNESIUM SULFATE 2 GM/50ML IV SOLN
2.0000 g | Freq: Once | INTRAVENOUS | Status: AC
  Administered 2020-05-31: 2 g via INTRAVENOUS
  Filled 2020-05-31: qty 50

## 2020-05-31 MED ORDER — SODIUM CHLORIDE 0.9 % IV BOLUS
500.0000 mL | Freq: Once | INTRAVENOUS | Status: AC
  Administered 2020-05-31: 500 mL via INTRAVENOUS

## 2020-05-31 MED ORDER — ACETAMINOPHEN 325 MG PO TABS
650.0000 mg | ORAL_TABLET | Freq: Once | ORAL | Status: AC
  Administered 2020-05-31: 650 mg via ORAL
  Filled 2020-05-31: qty 2

## 2020-05-31 MED ORDER — HYDROCORTISONE NA SUCCINATE PF 100 MG IJ SOLR
50.0000 mg | Freq: Once | INTRAMUSCULAR | Status: AC
  Administered 2020-05-31: 50 mg via INTRAVENOUS
  Filled 2020-05-31: qty 2

## 2020-05-31 MED ORDER — ALBUMIN HUMAN 25 % IV SOLN
12.5000 g | Freq: Once | INTRAVENOUS | Status: AC
  Administered 2020-05-31: 12.5 g via INTRAVENOUS
  Filled 2020-05-31: qty 50

## 2020-05-31 MED ORDER — POTASSIUM CHLORIDE CRYS ER 20 MEQ PO TBCR
20.0000 meq | EXTENDED_RELEASE_TABLET | Freq: Once | ORAL | Status: AC
  Administered 2020-05-31: 20 meq via ORAL
  Filled 2020-05-31: qty 1

## 2020-05-31 MED ORDER — POTASSIUM CHLORIDE CRYS ER 20 MEQ PO TBCR
40.0000 meq | EXTENDED_RELEASE_TABLET | Freq: Once | ORAL | Status: AC
  Administered 2020-05-31: 40 meq via ORAL
  Filled 2020-05-31: qty 2

## 2020-05-31 MED ORDER — PHENYLEPHRINE HCL-NACL 10-0.9 MG/250ML-% IV SOLN
INTRAVENOUS | Status: AC
  Administered 2020-05-31: 5 ug/min via INTRAVENOUS
  Filled 2020-05-31: qty 250

## 2020-05-31 MED ORDER — CEFAZOLIN SODIUM-DEXTROSE 1-4 GM/50ML-% IV SOLN: 1.0000 g | Freq: Three times a day (TID) | INTRAVENOUS | Status: DC

## 2020-05-31 MED ORDER — BUDESONIDE 0.25 MG/2ML IN SUSP
0.2500 mg | Freq: Two times a day (BID) | RESPIRATORY_TRACT | Status: DC
  Administered 2020-05-31 – 2020-06-10 (×11): 0.25 mg via RESPIRATORY_TRACT
  Filled 2020-05-31 (×25): qty 2

## 2020-05-31 MED ORDER — POLYETHYLENE GLYCOL 3350 17 G PO PACK: 17.0000 g | PACK | Freq: Every day | ORAL | Status: DC | PRN

## 2020-05-31 MED ORDER — SODIUM CHLORIDE 0.9% FLUSH
3.0000 mL | Freq: Two times a day (BID) | INTRAVENOUS | Status: DC
  Administered 2020-05-31 – 2020-06-11 (×20): 3 mL via INTRAVENOUS

## 2020-05-31 MED ORDER — HYDROCODONE-ACETAMINOPHEN 5-325 MG PO TABS: 1.0000 | ORAL_TABLET | ORAL | Status: DC | PRN

## 2020-05-31 MED ORDER — VANCOMYCIN HCL IN DEXTROSE 1-5 GM/200ML-% IV SOLN
1000.0000 mg | Freq: Two times a day (BID) | INTRAVENOUS | Status: DC
  Administered 2020-06-01: 1000 mg via INTRAVENOUS
  Filled 2020-05-31 (×2): qty 200

## 2020-05-31 MED ORDER — ACETAMINOPHEN 325 MG PO TABS: 650.0000 mg | ORAL_TABLET | Freq: Four times a day (QID) | ORAL | Status: DC | PRN

## 2020-05-31 MED ORDER — PIPERACILLIN-TAZOBACTAM 3.375 G IVPB
3.3750 g | Freq: Three times a day (TID) | INTRAVENOUS | Status: DC
  Administered 2020-05-31 – 2020-06-03 (×8): 3.375 g via INTRAVENOUS
  Filled 2020-05-31 (×11): qty 50

## 2020-05-31 MED ORDER — GABAPENTIN 300 MG PO CAPS
300.0000 mg | ORAL_CAPSULE | Freq: Three times a day (TID) | ORAL | Status: DC
  Administered 2020-05-31 – 2020-06-09 (×29): 300 mg via ORAL
  Filled 2020-05-31 (×30): qty 1

## 2020-05-31 MED ORDER — SODIUM CHLORIDE 0.9 % IV SOLN: INTRAVENOUS | Status: AC

## 2020-05-31 MED ORDER — CALCIUM CARBONATE 1250 (500 CA) MG PO TABS
600.0000 mg | ORAL_TABLET | Freq: Three times a day (TID) | ORAL | Status: DC
  Administered 2020-05-31: 625 mg via ORAL
  Filled 2020-05-31 (×5): qty 0.5

## 2020-05-31 MED ORDER — HEPARIN SODIUM (PORCINE) 5000 UNIT/ML IJ SOLN
5000.0000 [IU] | Freq: Three times a day (TID) | INTRAMUSCULAR | Status: DC
  Administered 2020-05-31 – 2020-06-06 (×15): 5000 [IU] via SUBCUTANEOUS
  Filled 2020-05-31 (×16): qty 1

## 2020-05-31 MED ORDER — ACETAMINOPHEN 650 MG RE SUPP: 650.0000 mg | Freq: Four times a day (QID) | RECTAL | Status: DC | PRN

## 2020-05-31 MED ORDER — DOCUSATE SODIUM 100 MG PO CAPS: 100.0000 mg | ORAL_CAPSULE | Freq: Two times a day (BID) | ORAL | Status: DC | PRN

## 2020-05-31 MED ORDER — NOREPINEPHRINE 4 MG/250ML-% IV SOLN: 2.0000 ug/min | INTRAVENOUS | Status: DC

## 2020-05-31 MED ORDER — NOREPINEPHRINE 4 MG/250ML-% IV SOLN
0.0000 ug/min | INTRAVENOUS | Status: DC
  Administered 2020-05-31: 5 ug/min via INTRAVENOUS
  Filled 2020-05-31: qty 250

## 2020-05-31 MED ORDER — CHLORHEXIDINE GLUCONATE CLOTH 2 % EX PADS
6.0000 | MEDICATED_PAD | Freq: Every day | CUTANEOUS | Status: DC
  Administered 2020-05-31 – 2020-06-08 (×8): 6 via TOPICAL

## 2020-05-31 NOTE — Progress Notes (Signed)
Pharmacy Electrolyte Replacement  Recent Labs:  Recent Labs    05/31/20 0338 05/31/20 0338 05/31/20 0730  K 3.4*  --   --   MG 1.7  --   --   PHOS 3.2  --   --   CREATININE 0.66   < > 0.74   < > = values in this interval not displayed.    Low Critical Values (K </= 2.5, Phos </= 1, Mg </= 1) Present: None  MD Contacted: n/a, no critical values idenified  Plan: Replaced K and Mag per Rx Elink protocol   Leia Alf, PharmD, BCPS Please check AMION for all [facility] contact numbers Clinical Pharmacist 05/31/2020 11:19 AM

## 2020-05-31 NOTE — H&P (Signed)
Nicole [name] FYT:[id number] DOB: [birth year] DOA: 05/30/2020     PCP: Vicenta Aly, [name]   Outpatient Specialists:    Neurology Clabe Seal NP   Patient arrived to ER on 05/30/20 at 2215 Referred by Attending Mesner, Corene Cornea, MD   Patient coming from:   From facility group home  Chief Complaint:   Chief Complaint  Patient presents with  . Diarrhea    HPI: Nicole [name] is a 69 y.o. 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  Persistent generalized weakness Patient recently had a prolonged hospital stay from 11th-20 4 June small bowel obstruction was discharged from [facility] on 24 June.  She has been having continuous diarrhea since then patient has been disoriented During her admission to [facility] patient was having diarrhea to the point she was C. difficile negative GI pathogen was negative diarrhea has somewhat improved.  Work-up was unremarkable she was given Imodium and that seemed to have have improvement diarrhea felt to be noninfectious  She has partial small bowel obstruction by 18 June patient showed resolution of small bowel obstruction and 23 June showed resolution of high volume stool burden and no obstruction noted diarrhea has slowed down at the time of discharge Acute kidney injury associated hyperkalemia -hold losartan was held and felt to be safe to resume at discharge her hyperkalemia was treated with [facility]  Per family's as of her discharge patient was still too weak to perform ADLs and has persistent since her discharge she has not had any vomiting  She has been to weak to even use a wheelchair  No blood in stools no black stools Only had one BM today  Family states patient has low BP at baseline but has been on BP meds in the past  Infectious  risk factors:  Reports fever, Diarrhea/abdominal pain,   severe fatigue   Has   been vaccinated against COVID    Initial COVID TEST   in house  PCR testing  Pending  Lab Results  Component Value Date   [place] 05/17/2020   [place] Not Detected 12/18/2019    Regarding pertinent Chronic problems:    Hyperlipidemia -  on statins Lipitor     HTN ARB has been held due to recent AKI and hyperkalemia  Seizure disorder partial seizures on Tegretol and Keppra as well as Neurontin  Hx of CVA - with  residual deficits           Chronic anemia - baseline hg Hemoglobin & Hematocrit  Recent Labs    05/25/20 0424 05/26/20 0418 05/30/20 2307  HGB 10.1* 9.6* 9.7*    While in ER: Noted to have low-grade fever up to 100.9 hypotensive blood pressure 97/43 and tachycardic up to 103 Appears dehydrated   Hospitalist was called for admission for cellulitis, Sepsis  The following Work up has been ordered so far:  Orders Placed This Encounter  Procedures  . Culture, blood (routine x 2)  . Urine culture  . Gastrointestinal Panel by PCR , Stool  . C Difficile Quick Screen w PCR reflex  . CT ABDOMEN PELVIS W CONTRAST  . CBC with Differential  . Lactic acid, plasma  . Comprehensive metabolic panel  . Urinalysis, Routine w reflex microscopic  . Check Rectal Temperature  . Consult to hospitalist  ALL PATIENTS BEING ADMITTED/HAVING PROCEDURES NEED COVID-19 SCREENING  . Enteric precautions (UV disinfection)    Following Medications were ordered in ER: Medications  sodium chloride 0.9 % bolus 1,000 mL (1,000 mLs Intravenous New Bag/Given 05/30/20 2314)  ceFAZolin (ANCEF) IVPB 1 g/50 mL premix (1 g Intravenous New Bag/Given 05/30/20 2356)  acetaminophen (TYLENOL) tablet 650 mg (650 mg Oral Given 05/31/20 0006)        Consult Orders  (From admission, onward)         Start     Ordered   05/31/20 0020  Consult to hospitalist  ALL PATIENTS BEING ADMITTED/HAVING PROCEDURES  NEED COVID-19 SCREENING  Once       Comments: ALL PATIENTS BEING ADMITTED/HAVING PROCEDURES NEED COVID-19 SCREENING  Provider:  (Not yet assigned)  Question Answer Comment  Place call to: [facility]   Reason for Consult Admit      05/31/20 0020          Significant initial  Findings: Abnormal Labs Reviewed  CBC WITH DIFFERENTIAL/PLATELET - Abnormal; Notable for the following components:      Result Value   RBC 3.06 (*)    Hemoglobin 9.7 (*)    HCT 31.0 (*)    MCV 101.3 (*)    All other components within normal limits  COMPREHENSIVE METABOLIC PANEL - Abnormal; Notable for the following components:   Potassium 3.4 (*)    Chloride 96 (*)    Glucose, Bld 113 (*)    Calcium 8.8 (*)    Total Protein 5.3 (*)    Albumin 1.6 (*)    All other components within normal limits    Otherwise labs showing:    Recent Labs  Lab 05/24/20 0425 05/25/20 0424 05/25/20 1442 05/26/20 0418 05/27/20 0424 05/30/20 2307  NA 137 137 138 134*  --  136  K 3.5 3.2* 3.2* 3.2*  --  3.4*  CO2 21* _0 --  30  GLUCOSE 121* 120* 145* 106*  --  113*  BUN _1 --  14  CREATININE 0.51 0.48 0.75 0.61  --  0.73  CALCIUM 8.2* 8.2* 8.3* 7.4*  --  8.8*  MG 1.7 1.7 1.6* 2.0 1.8  --     Cr    stable,   Lab Results  Component Value Date   CREATININE 0.73 05/30/2020   CREATININE 0.61 05/26/2020   CREATININE 0.75 05/25/2020    Recent Labs  Lab 05/26/20 0418 05/30/20 2307  AST 60* 37  ALT 37 27  ALKPHOS 45 47  BILITOT 0.8 0.3  PROT 5.8* 5.3*  ALBUMIN 1.8* 1.6*   Lab Results  Component Value Date   CALCIUM 8.8 (L) 05/30/2020      WBC      Component Value Date/Time   WBC 6.2 05/30/2020 2307   ANC    Component Value Date/Time   NEUTROABS PENDING 05/30/2020 2307   NEUTROABS 2.4 12/30/2019 1202    Plt: Lab Results  Component Value Date   PLT 397 05/30/2020    Lactic Acid, Venous    Component Value Date/Time   LATICACIDVEN 1.9 05/30/2020 2307     Procalcitonin  Ordered   HG/HCT   stable,       Component Value Date/Time   HGB 9.7 (L) 05/30/2020 2307   HGB 12.2 12/30/2019 1202   HCT 31.0 (L) 05/30/2020 2307   HCT 35.3 12/30/2019 1202     ECG:   Ordered    UA  ordered   Ordered   CXR -  NON acute  CTabd/pelvis -  [place]     ED Triage Vitals  Enc Vitals Group     BP 05/30/20 2219 (!) 110/43     Pulse Rate 05/30/20 2219 (!) 103     Resp 05/30/20 2219 16     Temp 05/30/20 2219 (!) 100.9 F (38.3 C)     Temp Source 05/30/20 2219 Oral     SpO2 05/30/20 2216 (!) 88 %     Weight --      Height --      Head Circumference --      Peak Flow --      Pain Score --      Pain Loc --      Pain Edu? --      Excl. in [place]? --   TMAX(24)@       Latest  Blood pressure (!) 110/51, pulse (!) 102, temperature (!) 101.9 F (38.8 C), temperature source Rectal, resp. rate (!) 23, SpO2 100 %.    Review of Systems:    Pertinent positives include:  diarrhea,  Constitutional:  No weight loss, night sweats, Fevers, chills, fatigue, weight loss  HEENT:  No headaches, Difficulty swallowing,Tooth/dental problems,Sore throat,  No sneezing, itching, ear ache, nasal congestion, post nasal drip,  Cardio-vascular:  No chest pain, Orthopnea, PND, anasarca, dizziness, palpitations.no Bilateral lower extremity swelling  GI:  No heartburn, indigestion, abdominal pain, nausea, vomiting,  change in bowel habits, loss of appetite, melena, blood in stool, hematemesis Resp:  no shortness of breath at rest. No dyspnea on exertion, No excess mucus, no productive cough, No non-productive cough, No coughing up of blood.No change in color of mucus.No wheezing. Skin:  no rash or lesions. No jaundice GU:  no dysuria, change in color of urine, no urgency or frequency. No straining to urinate.  No flank pain.  Musculoskeletal:  No joint pain or no joint swelling. No decreased range of motion. No back pain.  Psych:  No change in mood or affect. No  depression or anxiety. No memory loss.  Neuro: no localizing neurological complaints, no tingling, no weakness, no double vision, no gait abnormality, no slurred speech, no confusion  All systems reviewed and apart from [place] all are negative  Past Medical History:   Past Medical History:  Diagnosis Date  . 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 Allergic rhinitis   . Alopecia   . Discoid lupus   . Hyperlipidemia   . Hypertension   . Menopause   . Moderate mental retardation   . Obesity   . Osteoporosis   . Pulmonary nodules   . Seizures ([place])    focal-being by Dr. Erling Cruz (should not take anti-histamines)     Past Surgical History:  Procedure Laterality Date  . VESICOVAGINAL FISTULA CLOSURE W/ TAH      Social History:  Ambulatory   wheelchair bound,      reports that she has never smoked. She has never used smokeless tobacco. She reports that she does not drink alcohol and does not use drugs.   Family History:   Family History  Problem Relation Age of Onset  . Breast cancer Mother     Allergies: Allergies  Allergen Reactions  . Seasonal Ic [Cholestatin]     Runny nose     Prior to Admission medications   Medication Sig Start Date End Date Taking? Authorizing Provider  atorvastatin (LIPITOR)  10 MG tablet Take 10 mg by mouth every Monday, Wednesday, and Friday.  12/08/19   [provider]  budesonide (PULMICORT FLEXHALER) 180 MCG/ACT inhaler Inhale 1 puff into the lungs 2 (two) times daily. 09/10/19   [provider]  busPIRone (BUSPAR) 15 MG tablet Take 15 mg by mouth 2 (two) times daily.      [provider]  calcium carbonate (OS-CAL) 600 MG TABS tablet Take 600 mg by mouth 3 (three) times daily with meals. Take by mouth.    [provider]  carbamazepine (TEGRETOL XR) 100 MG 12 hr tablet Take 3 tablets (300 mg total) by mouth 2 (two) times daily. Reducing due to low sodium levels recently. Patient taking differently:  Take 300 mg by mouth 2 (two) times daily.  04/10/18   Star Age, MD  Cholecalciferol (VITAMIN D3) 2000 units TABS Take 2,000 Units by mouth daily.     [provider]  diclofenac (VOLTAREN) 75 MG EC tablet Take 75 mg by mouth 2 (two) times daily.  07/28/16   [provider]  gabapentin (NEURONTIN) 300 MG capsule Take 1 capsule (300 mg total) by mouth in the morning, at noon, and at bedtime. Taken tid on weekends, taken qid on weekdays. 05/27/20   Samuella Cota, MD  levETIRAcetam (KEPPRA) 500 MG tablet Take 1 tablet (500 mg total) by mouth 2 (two) times daily. 04/10/18   Star Age, MD  Multiple Vitamins-Minerals (CERTAVITE/ANTIOXIDANTS) TABS Take 1 tablet by mouth daily. Advance , daily     [provider]  pantoprazole (PROTONIX) 40 MG tablet Take 40 mg by mouth daily.  08/04/13   [provider]  Selenium 200 MCG TBCR Take 1 tablet by mouth daily.    [provider]  Wheat Dextrin (BENEFIBER) CHEW Chew 1 tablet by mouth daily.     [provider]   Physical Exam: Vitals with BMI 05/31/2020 05/31/2020 05/31/2020  Height - - -  Weight - - -  BMI - - -  Systolic 88 92 172  Diastolic 50 52 56  Pulse 92 96 102     1. General:  in No  Acute distress   Chronically ill -appearing 2. Psychological: Alert  but not Oriented 3. Head/ENT:     Dry Mucous Membranes                          Head Non traumatic, neck supple                            Poor Dentition 4. SKIN:   decreased Skin turgor,  Skin clean Dry and intact Left leg with redness and swelling 5. Heart: Regular rate and rhythm no Murmur, no Rub or gallop 6. Lungs:  no wheezes or crackles   7. Abdomen: Soft,  non-tender, Non distended  bowel sounds present 8. Lower extremities: no clubbing, cyanosis,    Edema R>L 9. Neurologically Grossly intact, moving all 4 extremities equally  10. MSK: Normal range of motion   All other LABS:     Recent Labs  Lab 05/24/20 0425  05/25/20 0424 05/26/20 0418 05/30/20 2307  WBC 7.9 7.3 6.5 6.2  NEUTROABS  --   --  4.9 PENDING  HGB 10.2* 10.1* 9.6* 9.7*  HCT 30.4* 30.7* 29.3* 31.0*  MCV 98.7 98.7 99.3 101.3*  PLT 275 293 275 397     Recent Labs  Lab 05/24/20 0425 05/25/20 0424 05/25/20 1442 05/26/20 0418 05/27/20 0424 05/30/20 2307  NA 137 137 138 134*  --  136  K 3.5 3.2* 3.2* 3.2*  --  3.4*  CL 104 103 100 97*  --  96*  CO2 21* _0 --  30  GLUCOSE 121* 120* 145* 106*  --  113*  BUN _1 --  14  CREATININE 0.51 0.48 0.75 0.61  --  0.73  CALCIUM 8.2* 8.2* 8.3* 7.4*  --  8.8*  MG 1.7 1.7 1.6* 2.0 1.8  --      Recent Labs  Lab 05/26/20 0418 05/30/20 2307  AST 60* 37  ALT 37 27  ALKPHOS 45 47  BILITOT 0.8 0.3  PROT 5.8* 5.3*  ALBUMIN 1.8* 1.6*       Cultures: No results found for: SDES, SPECREQUEST, CULT, REPTSTATUS   Radiological Exams on Admission: CT ABDOMEN PELVIS W CONTRAST  Result Date: 05/31/2020 CLINICAL DATA:  Abdominal abscess/infection suspected Diarrhea. Patient with history of vesicle vaginal fistula closure. Recent bowel obstruction. EXAM: CT ABDOMEN AND PELVIS WITH CONTRAST TECHNIQUE: Multidetector CT imaging of the abdomen and pelvis was performed using the standard protocol following bolus administration of intravenous contrast. CONTRAST:  125m OMNIPAQUE IOHEXOL 300 MG/ML  SOLN COMPARISON:  Multiple recent radiographs. FINDINGS: Lower chest: Small bilateral pleural effusions with compressive atelectasis. Mild wall thickening of the distal esophagus. Small amount of contrast in the distal esophagus. Hepatobiliary: No focal liver abnormality is seen. No gallstones, gallbladder wall thickening, or biliary dilatation. Pancreas: No ductal dilatation or inflammation. Spleen: Normal in size without focal abnormality. Splenule at the hilum. Adrenals/Urinary Tract: Mild left adrenal thickening without dominant nodule. Normal right adrenal gland. No hydronephrosis or  perinephric edema. Homogeneous renal enhancement with symmetric excretion on delayed phase imaging. There is some early excretion of IV contrast on initial exam. Subcentimeter low-density lesion in the upper left kidney is too small to characterize but likely cyst. Urinary bladder is partially distended. There is excreted IV contrast in the dependent urinary bladder. Stomach/Bowel: There is colonic wall thickening with pericolonic stranding involving the distal descending through sigmoid colon. Small amount of liquid stool in the distal colon. There is a moderate volume of stool in the remainder of the colon. There is mild transverse colonic redundancy. The appendix is not definitively visualized. There is no small bowel dilatation or evidence of obstruction. Stomach is unremarkable. Vascular/Lymphatic: Aortic atherosclerosis. No aortic aneurysm. Patent portal vein. There multiple prominent retroperitoneal nodes are all subcentimeter short axis. Reproductive: Post hysterectomy. Left ovary appears in situ and quiescent. 15 mm fluid density structure in the right adnexa is likely an ovarian cyst. Other: Pericolonic edema and fat stranding from the descending through the sigmoid colon. Small amount of free fluid in both pericolic gutters, left anterior pelvis, presacral region. There is presacral edema. 15 mm fluid density structure in the right pelvis is likely an ovarian cyst, however no prior exams are available to confirm chronicity, and there is mild adjacent stranding. No free intra-abdominal air. Musculoskeletal: L3-L4 endplate sclerosis with endplate irregularity and erosions. Slight lateral translation of L3 on L4. mild L4 superior endplate compression fracture. Anterior osteophytes that are fragmented at this level. There is severe canal stenosis at this level. Probable remote sacral fracture. Additional endplate spurring and degenerative disc disease with facet hypertrophy throughout the lumbar spine.  IMPRESSION: 1. Colonic wall thickening with pericolonic edema and fat stranding involving the distal descending through  sigmoid colon consistent with colitis, likely infectious or inflammatory. 2. Small amount of free fluid in both pericolic gutters, left anterior pelvis, and presacral region, likely reactive. There is a 15 mm fluid density structure in the right adnexa, felt to represent a small ovarian cyst, however there is mild adjacent stranding in this area and no prior exams are available to confirm chronicity. Small abscess is difficult to exclude, but felt less likely. 3. Small bilateral pleural effusions with compressive atelectasis. 4. L3-L4 endplate sclerosis with endplate irregularity and erosions. No prior exams are available for comparison to establish chronicity. Given there multiple prominent retroperitoneal lymph nodes and presacral edema, consider further evaluation with lumbar spine MRI. 5. Mild wall thickening of the distal esophagus containing a small amount of enteric contrast, suggesting reflux. Aortic Atherosclerosis (ICD10-I70.0). Electronically Signed   By: Keith Rake M.D.   On: 05/31/2020 01:49   DG CHEST PORT 1 VIEW  Result Date: 05/31/2020 CLINICAL DATA:  Hypoxia EXAM: PORTABLE CHEST 1 VIEW COMPARISON:  None. FINDINGS: The heart size and mediastinal contours are within normal limits. Both lungs are clear. The visualized skeletal structures are unremarkable. IMPRESSION: No acute airspace disease. Electronically Signed   By: Ulyses Jarred M.D.   On: 05/31/2020 01:29    Chart has been reviewed   Assessment/Plan   69 y.o. 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 Cellulitis -of left lower extremity-admit per cellulitis protocol will       continue current antibiotic zosyn to cover both     Will obtain MRSA  screening,        obtain blood cultures if febrile or septic     further antibiotic adjustment pending above results  . Sepsis ([place]) - -SIRS criteria met with   tachycardia ,  fever.    With evidence of end organ damage such as  hypoxia    -Most likely source being cellulitis vs Colitis  - Obtain serial lactic acid and procalcitonin level.  - Initiate IV antibiotics start on zosyn to cover both   - await results of blood and urine culture  - Rehydrate aggressively  . Diarrhea - Colitis on CT will add metronidazole Gastric panel and c.diff pending have had negative studies recently  . Dehydration - will rehydrate given hypotension, hold antihypertensives meds  Hypoalbuminemia - likely due to decreased po intake , will check prealbumin level and order nutritional consult, administer albumin  History of stroke at birth with left-sided hemiparesis.  Chronic  Lower extremity swelling most likely secondary to hypoalbuminemia Given inability to ambulate will check for DVT  . Hypertension - hold home meds  . Hyperlipidemia - stable continue home meds  L3-L4 endplate sclerosis - discussed with family will hold off on imaging for now given hypotension but can be reevaluated in the future.  Other plan as per orders.  DVT prophylaxis:  SCD       Code Status:    Code Status: Prior FULL CODE   as per  family  I had personally discussed CODE STATUS with  Family  I had spent 15 min discussing goals of care and CODE STATUS   Family Communication:   Family at  Bedside  plan of care was discussed  with    Sister   Disposition Plan:    likely will need placement for rehabilitation  Following barriers for discharge:                            Electrolytes corrected                                                            Pain controlled with PO medications                               Afebrile,   able to transition to PO antibiotics                              Will need to be able to tolerate PO                                                Would benefit from PT/OT eval prior to DC  Ordered                     Transition of care consulted                   Nutrition    consulted                                      Consults called: none   Admission status:  ED Disposition    ED Disposition Condition [place]: [place] [100100]  Level of Care: Progressive [102]  Admit to Progressive based on following criteria: MULTISYSTEM THREATS such as stable sepsis, metabolic/electrolyte imbalance with or without encephalopathy that is responding to early treatment.  May admit patient to [facility] or [facility] if equivalent level of care is available:: No  Covid Evaluation: Asymptomatic Screening Protocol (No Symptoms)  Admission Type: Urgent [2]  Diagnosis: Cellulitis [[id number]]  Admitting Physician: Toy Baker [[id number]]  Attending Physician: Toy Baker [[id number]]  Estimated length of stay: past midnight tomorrow  Certification:: I certify this patient will need inpatient services for at least 2 midnights          inpatient     I Expect 2 midnight stay secondary to severity of patient's current illness need for inpatient interventions justified by the following:  hemodynamic instability despite optimal treatment (tachycardia  hypotension hypoxia)  Severe lab/radiological/exam abnormalities including:     and extensive comorbidities including:  history of stroke with residual deficits   That are currently affecting medical management.   I expect  patient to be hospitalized for 2 midnights requiring inpatient medical care.  Patient is at high risk for adverse outcome (such as loss of life or disability) if not treated.  Indication for inpatient stay as follows:  Severe change from baseline regarding mental status Hemodynamic instability despite maximal medical therapy,    New or worsening  hypoxia  Need for IV antibiotics, IV fluids    Level of care   tele  For  24H  Precautions: admitted as  Asymptomatic    Enteric precautions (UV disinfection)   PPE: Used by the provider:   N95  eye Goggles,  Gloves     Indra Wolters 05/31/2020, 2:27 AM    [facility]     after 2 AM please page floor coverage PA If 7AM-7PM, please contact the day team taking care of the patient using [email or web address]   Patient was evaluated in the context of the global COVID-19 pandemic, which necessitated consideration that the patient might be at risk for infection with the SARS-CoV-2 virus that causes COVID-19. Institutional protocols and algorithms that pertain to the evaluation of patients at risk for COVID-19 are in a state of rapid change based on information released by regulatory bodies including the CDC and federal and state organizations. These policies and algorithms were followed during the patient's care.

## 2020-05-31 NOTE — Progress Notes (Addendum)
Pharmacy Antibiotic Note  Nicole [name] is a 69 y.o. female admitted on 05/30/2020 with wound infection.  Pharmacy has been consulted for vancomycin dosing. Already started on Zosyn this morning. SCr 0.74 stable (baseline ~0.4-0.8).  Plan: Vancomycin 1750mg  IV x 1; then 1g IV q12h Zosyn 3.375g IV q8h (4h infusion) Monitor clinical progress, c/s, renal function F/u de-escalation plan/LOT, vancomycin levels as indicated      Temp (24hrs), Avg:100.4 F (38 C), Min:98.5 F (36.9 C), Max:101.9 F (38.8 C)  Recent Labs  Lab 05/25/20 0424 05/25/20 0424 05/25/20 1442 05/26/20 0418 05/30/20 2307 05/31/20 0338 05/31/20 0447 05/31/20 0730  WBC 7.3  --   --  6.5 6.2 6.7  --  8.6  CREATININE 0.48   < > 0.75 0.61 0.73 0.66  --  0.74  LATICACIDVEN  --   --   --   --  1.9  --  1.2  --    < > = values in this interval not displayed.    Estimated Creatinine Clearance: 73.3 mL/min (by C-G formula based on SCr of 0.74 mg/dL).    Allergies  Allergen Reactions   Seasonal Ic [Cholestatin]     Runny nose    06/02/20, PharmD, BCPS Please check AMION for all [facility] Pharm[facility] contact numbers Clinical Pharmacist 05/31/2020 10:59 AM

## 2020-05-31 NOTE — Consult Note (Addendum)
NAME:  Nicole [name], MRN:  [id number], DOB:  February 13, 1951, LOS: 0 ADMISSION DATE:  05/30/2020, CONSULTATION DATE:  05/31/20 REFERRING MD:  EDP, CHIEF COMPLAINT:  Diarrhea   Brief History   69 y.o. 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 She was initially admitted to the hospitalist service, but was persistently hypotensive and required vasopressors, so PCCM consulted for admission.  History of present illness   Nicole [name] is a 69 y.o.  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  She was C. Diff negative during last admission.  Family denied any nausea, vomiting or fever and stool has been non-bloody. Pt has alsop been weaker and more confused.  In the ED, pt was febrile to 101.9 and hypotensive, received 2.5L IVF, albumin, ancef and zosyn.  Labs did not show any leukocytosis, lactic acidosis or significant metabolic derangement.  CT Abd/pelvis showed colitis and LLR noted to have erythema and edema.  She was given Ancef and Zosyn and admitted to internal medicine, however pt was persistently hypotensive so PCCM asked to admit to the ICU for vasopressors  Past Medical History   has a past medical history of Abnormal WBC count, Allergic rhinitis, Alopecia, Discoid lupus, Hyperlipidemia, Hypertension, Menopause, Moderate mental retardation, Obesity, Osteoporosis, Pulmonary nodules, and Seizures (HCC).   Significant Hospital Events   6/28 Admit to PCCM  Consults:  PCCM  Procedures:    Significant Diagnostic Tests:  05/31/20 CXR >>no acute findings 05/31/20 CT abd/pelvis >>Colonic wall thickening with pericolonic edema and fat stranding involving the distal descending through sigmoid  colon consistent with colitis, likely infectious or inflammatory.  Micro Data:  6/28 Sars-Cov-2>>negative 6/28 MRSA screen>>negative 6/28 BCx2>> 6/28 GI panel>> 6/28 C. Diff>> 6/28 UC     Antimicrobials:  Ancef 6/27 only Zosyn 6/27- Vancomycin 6/26  Interim history/subjective:  Pt sleepy and poorly ressponsive  Objective   Blood pressure 109/62, pulse 68, temperature 98.5 F (36.9 C), temperature source Oral, resp. rate 14, SpO2 100 %.        Intake/Output Summary (Last 24 hours) at 05/31/2020 0539 Last data filed at 05/31/2020 0411 Gross per 24 hour  Intake 2550 ml  Output --  Net 2550 ml   There were no vitals filed for this visit.    General:  Elderly F, no acute distress HEENT: MM pink/moist Neuro: somnolent, protecting her airway CV: s1s2 rrr, no m/r/g PULM:  CTAB GI: soft, bsx4 active  Extremities: warm/dry, 1+ edema  Skin: no rashes or lesions, RLE edema and erythema   Resolved Hospital Problem list     Assessment & Plan:   Severe Sepsis secondary to acute Colitis with RLE cellulitis Pt received 30cc/kg ICF with continued hypotension -Follow blood cultures, C. Diff and GI pathogen panels -Levophed to maintain MAP >65, currently on , if requiring >28mcg may need CVC, consent obtained from family -Give one dose solucortef -broad spectrum antibiotics with Zosyn and Vancomycin -LE doppler to eval for DVT  HTN Holding home medications   L3-L4 endplate sclerosis Hospitalist discussed with family, hold off further imaging while hypotensive  Seizure Disorder -Continue Keppra and Tegretol  -seizure precautions  Best practice:  Diet: NPO Pain/Anxiety/Delirium  protocol (if indicated): n/a VAP protocol (if indicated): n/a DVT prophylaxis: heparin GI prophylaxis: n/a Glucose control: SSI Mobility:bed rest Code Status: Full code, family reversed prior DNR Family Communication: updated at the bedside Disposition: ICU  Labs    CBC: Recent Labs  Lab 05/25/20 0424 05/26/20 0418 05/30/20 2307 05/31/20 0338  WBC 7.3 6.5 6.2 PENDING  NEUTROABS  --  4.9 5.1 PENDING  HGB 10.1* 9.6* 9.7* 7.9*  HCT 30.7* 29.3* 31.0* 25.4*  MCV 98.7 99.3 101.3* 102.4*  PLT 293 275 397 034    Basic Metabolic Panel: Recent Labs  Lab 05/25/20 0424 05/25/20 1442 05/26/20 0418 05/27/20 0424 05/30/20 2307  NA 137 138 134*  --  136  K 3.2* 3.2* 3.2*  --  3.4*  CL 103 100 97*  --  96*  CO2 25 28 27   --  30  GLUCOSE 120* 145* 106*  --  113*  BUN 15 14 14   --  14  CREATININE 0.48 0.75 0.61  --  0.73  CALCIUM 8.2* 8.3* 7.4*  --  8.8*  MG 1.7 1.6* 2.0 1.8  --    GFR: Estimated Creatinine Clearance: 73.3 mL/min (by C-G formula based on SCr of 0.73 mg/dL). Recent Labs  Lab 05/25/20 0424 05/26/20 0418 05/30/20 2307 05/31/20 0338 05/31/20 0447  WBC 7.3 6.5 6.2 PENDING  --   LATICACIDVEN  --   --  1.9  --  1.2    Liver Function Tests: Recent Labs  Lab 05/26/20 0418 05/30/20 2307  AST 60* 37  ALT 37 27  ALKPHOS 45 47  BILITOT 0.8 0.3  PROT 5.8* 5.3*  ALBUMIN 1.8* 1.6*   No results for input(s): LIPASE, AMYLASE in the last 168 hours. No results for input(s): AMMONIA in the last 168 hours.  ABG No results found for: PHART, PCO2ART, PO2ART, HCO3, TCO2, ACIDBASEDEF, O2SAT   Coagulation Profile: No results for input(s): INR, PROTIME in the last 168 hours.  Cardiac Enzymes: No results for input(s): CKTOTAL, CKMB, CKMBINDEX, TROPONINI in the last 168 hours.  HbA1C: No results found for: HGBA1C  CBG: No results for input(s): GLUCAP in the last 168 hours.  Review of Systems:   Unable to obtain secondary to mental status  Past Medical History  She,  has a past medical history of Abnormal WBC count, Allergic rhinitis, Alopecia, Discoid lupus, Hyperlipidemia, Hypertension, Menopause, Moderate mental retardation, Obesity, Osteoporosis, Pulmonary nodules, and Seizures ([place]).   Surgical History    Past Surgical  History:  Procedure Laterality Date  . VESICOVAGINAL FISTULA CLOSURE W/ TAH       Social History   reports that she has never smoked. She has never used smokeless tobacco. She reports that she does not drink alcohol and does not use drugs.   Family History   Her family history includes Breast cancer in her mother.   Allergies Allergies  Allergen Reactions  . Seasonal Ic [Cholestatin]     Runny nose     Home Medications  Prior to Admission medications   Medication Sig Start Date End Date Taking? Authorizing Provider  atorvastatin (LIPITOR) 10 MG tablet Take 10 mg by mouth every Monday, Wednesday, and Friday.  12/08/19  Yes [provider]  budesonide (PULMICORT FLEXHALER) 180 MCG/ACT inhaler Inhale 1 puff into the lungs 2 (two) times daily. 09/10/19  Yes [provider]  busPIRone (BUSPAR) 15 MG tablet Take 15 mg by mouth 2 (two) times daily.     Yes [provider]  calcium  carbonate (OS-CAL) 600 MG TABS tablet Take 600 mg by mouth 3 (three) times daily with meals.    Yes [provider]  carbamazepine (TEGRETOL XR) 100 MG 12 hr tablet Take 3 tablets (300 mg total) by mouth 2 (two) times daily. Reducing due to low sodium levels recently. Patient taking differently: Take 300 mg by mouth 2 (two) times daily.  04/10/18  Yes Huston Foley, MD  Cholecalciferol (VITAMIN D3) 2000 units TABS Take 2,000 Units by mouth daily.    Yes [provider]  diclofenac (VOLTAREN) 75 MG EC tablet Take 75 mg by mouth 2 (two) times daily.  07/28/16  Yes [provider]  gabapentin (NEURONTIN) 300 MG capsule Take 1 capsule (300 mg total) by mouth in the morning, at noon, and at bedtime. Taken tid on weekends, taken qid on weekdays. Patient taking differently: Take 300 mg by mouth in the morning, at noon, and at bedtime.  05/27/20  Yes Standley Brooking, MD  levETIRAcetam (KEPPRA) 500 MG tablet Take 1 tablet (500 mg total) by mouth 2 (two) times daily. 04/10/18   Yes Huston Foley, MD  Multiple Vitamins-Minerals (CERTAVITE/ANTIOXIDANTS) TABS Take 1 tablet by mouth daily. Advance , daily    Yes [provider]  pantoprazole (PROTONIX) 40 MG tablet Take 40 mg by mouth daily.  08/04/13  Yes [provider]  Selenium 200 MCG TBCR Take 1 tablet by mouth daily.   Yes [provider]  Wheat Dextrin (BENEFIBER) CHEW Chew 1 tablet by mouth daily.    Yes [provider]     Critical care time: 55 minutes    CRITICAL CARE Performed by: Darcella Gasman Shamicka Inga   Total critical care time: 55 minutes  Critical care time was exclusive of separately billable procedures and treating other patients.  Critical care was necessary to treat or prevent imminent or life-threatening deterioration.  Critical care was time spent personally by me on the following activities: development of treatment plan with patient and/or surrogate as well as nursing, discussions with consultants, evaluation of patient's response to treatment, examination of patient, obtaining history from patient or surrogate, ordering and performing treatments and interventions, ordering and review of laboratory studies, ordering and review of radiographic studies, pulse oximetry and re-evaluation of patient's condition.  Darcella Gasman Germaine Ripp, PA-C

## 2020-05-31 NOTE — Progress Notes (Signed)
Initial Nutrition Assessment  DOCUMENTATION CODES:   Not applicable  INTERVENTION:   - Boost Breeze po TID, each supplement provides 250 kcal and 9 grams of protein  - RD will follow for diet advancement and adjust supplement regimen as appropriate  NUTRITION DIAGNOSIS:   Increased nutrient needs related to acute illness as evidenced by estimated needs.  GOAL:   Patient will meet greater than or equal to 90% of their needs  MONITOR:   PO intake, Supplement acceptance, Diet advancement, Labs, Weight trends, Skin, I & O's  REASON FOR ASSESSMENT:   Consult Malnutrition Eval  ASSESSMENT:   69 year old female who presented from [facility] on 6/27 with diarrhea. 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 Recent admission 6/11-6/24 for diarrhea and hypotension and was found to have SBO vs ileus which resolved with NG tube. Admitted for severe sepsis secondary to acute colitis with RLE cellulitis.   Diet advanced to clear liquids this afternoon. Levophed off this morning.  Spoke with pt's sister at bedside. Pt's sister reports that prior to most recent hospitalization, pt had a good appetite and good PO intake. Pt's sister reports that pt was on a "liquid diet" during last hospitalization and was advanced to solids prior to discharge. Since pt's discharge, pt consumed and tolerated energy bars, sandwiches, and Gatorade.  RD will order Boost Breeze while pt is on a clear liquid diet. Per pt's sister, pt does not tolerate milk-like oral nutrition supplements.  Pt's sister does not believe that pt has lost any weight recently and is unsure of pt's UBW. Pt's sister does report that pt occasionally has bloating/swelling in her abdomen.  Medications reviewed and include: calcium carbonate, IV abx IVF: NS @ 10 ml/hr  Labs reviewed: hemoglobin 8.8, potassium 3.4 CBG's: 144  NUTRITION - FOCUSED PHYSICAL EXAM:     Most Recent Value  Orbital Region No depletion  Upper Arm Region No depletion  Thoracic and Lumbar Region No depletion  Buccal Region No depletion  Temple Region No depletion  Clavicle Bone Region No depletion  Clavicle and Acromion Bone Region Mild depletion  Scapular Bone Region No depletion  Dorsal Hand No depletion  Patellar Region No depletion  Anterior Thigh Region No depletion  Posterior Calf Region No depletion  Edema (RD Assessment) Mild  [BUE, BLE]  Hair Reviewed  Eyes Reviewed  Mouth Reviewed  Skin Reviewed  Nails Reviewed       Diet Order:   Diet Order            Diet clear liquid Room service appropriate? Yes; Fluid consistency: Thin  Diet effective now                 EDUCATION NEEDS:   No education needs have been identified at this time  Skin:  Skin Assessment: Reviewed RN Assessment (MASD to perineum and buttocks)  Last BM:  05/31/20 type 7  Height:   Ht Readings from Last 1 Encounters:  05/31/20 5\' 7"  (1.702 m)    Weight:   Wt Readings from Last 1 Encounters:  05/31/20 87.2 kg    Ideal Body Weight:  61.4 kg  BMI:  Body mass index is 30.11 kg/m.  Estimated Nutritional Needs:   Kcal:  1700-1900  Protein:  85-100 grams  Fluid:  1.7-1.9 L    06/02/20, MS, RD, LDN Inpatient Clinical Dietitian Pager: [phone] Weekend/After Hours: [phone]

## 2020-05-31 NOTE — Significant Event (Signed)
PCCM Interval Note  I spoke with the patient's sister and legal guardian [name] at bedside.  Updated her on the patient's status, work-up thus far, plan of care.  Also discussed overall goals of care with her.  [name] clarified that her sister would not want invasive care, CPR should she decline, experienced cardiopulmonary arrest.  She would want all appropriate medical care including pressors (which she received and which are now off).  Based on our discussions it is clear that the patient would not want intubation, mechanical ventilation, ACLS.  I will place DNR orders on the chart.  Independent CC time 35 minutes  Levy Pupa, MD, PhD 05/31/2020, 2:25 PM [facility] [phone] or if no answer [phone]

## 2020-06-01 ENCOUNTER — Inpatient Hospital Stay (HOSPITAL_COMMUNITY): Payer: Medicare Other

## 2020-06-01 DIAGNOSIS — R609 Edema, unspecified: Secondary | ICD-10-CM

## 2020-06-01 DIAGNOSIS — E860 Dehydration: Secondary | ICD-10-CM

## 2020-06-01 DIAGNOSIS — R197 Diarrhea, unspecified: Secondary | ICD-10-CM

## 2020-06-01 DIAGNOSIS — K529 Noninfective gastroenteritis and colitis, unspecified: Secondary | ICD-10-CM

## 2020-06-01 LAB — URINE CULTURE: Culture: NO GROWTH

## 2020-06-01 LAB — C DIFFICILE QUICK SCREEN W PCR REFLEX
C Diff antigen: NEGATIVE
C Diff interpretation: NOT DETECTED
C Diff toxin: NEGATIVE

## 2020-06-01 MED ORDER — MUPIROCIN 2 % EX OINT
TOPICAL_OINTMENT | Freq: Two times a day (BID) | CUTANEOUS | Status: DC
  Administered 2020-06-05 – 2020-06-16 (×3): 1 via NASAL
  Filled 2020-06-01 (×6): qty 22

## 2020-06-01 MED ORDER — CALCIUM CARBONATE 1250 (500 CA) MG PO TABS
1.0000 | ORAL_TABLET | Freq: Three times a day (TID) | ORAL | Status: DC
  Administered 2020-06-01 – 2020-06-17 (×45): 500 mg via ORAL
  Filled 2020-06-01 (×47): qty 1

## 2020-06-01 NOTE — H&P (View-Only) (Signed)
Referring Provider: Kandice RobinsonsSarah Groce, NP Primary Care Physician:  Elizabeth PalauAnderson, [name], [name] Primary Gastroenterologist:  [facility] (saw Dr. Randa EvensEdwards in 2010)  Reason for Consultation:  diarrhea  HPI: Nicole [name] is a 69 y.o. female with past medical history noted below to include recent small bowel obstruction vs. ileus and diarrhea presenting for consultation of diarrhea.  Patient has had diarrhea for at least 3 weeks. She denies any rectal bleeding, melena, or abdominal pain.  She further denies any dysphagia, GERD, changes in appetite, unexplained weight loss, nausea, vomiting, and hematemesis.    Patient's sister and guarding Gillis EndsSybil was at bedside.  Reports patient has had diarrhea for the past 3 weeks.  At the beginning, there was one day in which the diarrhea had a small amount of mucus and blood.  Other than that, patient has had brown stools of "soup" consistency.  Patient took imodium one day with some improvement, but after the imodium wore off, she continued to have diarrhea.  No prior colonoscopy.  EGD in 2010 for suspected GERD was unremarkable.  Patient was recently on Vancomycin for lower extremity wound, last dose yesterday.  Remains on Zosyn.  Patient was hospitalized from 6/14 to 05/27/20 with SBO vs. Ileus, resolved with small bowel protocol.  Past Medical History:  Diagnosis Date   Abnormal WBC count    low chronic WBC (2-3K)   Allergic rhinitis    Alopecia    Discoid lupus    Hyperlipidemia    Hypertension    Menopause    Moderate mental retardation    Obesity    Osteoporosis    Pulmonary nodules    Seizures (HCC)    focal-being by Dr. Sandria ManlyLove (should not take anti-histamines)    Past Surgical History:  Procedure Laterality Date   VESICOVAGINAL FISTULA CLOSURE W/ TAH      Prior to Admission medications   Medication Sig Start Date End Date Taking? Authorizing Provider  atorvastatin (LIPITOR) 10 MG tablet Take 10 mg by mouth every Monday, Wednesday, and  Friday.  12/08/19  Yes [provider]  budesonide (PULMICORT FLEXHALER) 180 MCG/ACT inhaler Inhale 1 puff into the lungs 2 (two) times daily. 09/10/19  Yes [provider]  busPIRone (BUSPAR) 15 MG tablet Take 15 mg by mouth 2 (two) times daily.     Yes [provider]  calcium carbonate (OS-CAL) 600 MG TABS tablet Take 600 mg by mouth 3 (three) times daily with meals.    Yes [provider]  carbamazepine (TEGRETOL XR) 100 MG 12 hr tablet Take 3 tablets (300 mg total) by mouth 2 (two) times daily. Reducing due to low sodium levels recently. Patient taking differently: Take 300 mg by mouth 2 (two) times daily.  04/10/18  Yes Huston FoleyAthar, Saima, MD  Cholecalciferol (VITAMIN D3) 2000 units TABS Take 2,000 Units by mouth daily.    Yes [provider]  diclofenac (VOLTAREN) 75 MG EC tablet Take 75 mg by mouth 2 (two) times daily.  07/28/16  Yes [provider]  gabapentin (NEURONTIN) 300 MG capsule Take 1 capsule (300 mg total) by mouth in the morning, at noon, and at bedtime. Taken tid on weekends, taken qid on weekdays. Patient taking differently: Take 300 mg by mouth in the morning, at noon, and at bedtime.  05/27/20  Yes Standley BrookingGoodrich, Daniel P, MD  levETIRAcetam (KEPPRA) 500 MG tablet Take 1 tablet (500 mg total) by mouth 2 (two) times daily. 04/10/18  Yes Huston FoleyAthar, Saima, MD  Multiple Vitamins-Minerals (CERTAVITE/ANTIOXIDANTS)  TABS Take 1 tablet by mouth daily. Advance , daily    Yes [provider]  pantoprazole (PROTONIX) 40 MG tablet Take 40 mg by mouth daily.  08/04/13  Yes [provider]  Selenium 200 MCG TBCR Take 1 tablet by mouth daily.   Yes [provider]  Wheat Dextrin (BENEFIBER) CHEW Chew 1 tablet by mouth daily.    Yes [provider]    Scheduled Meds:  atorvastatin  10 mg Oral Q M,W,F   budesonide  0.25 mg Inhalation BID   calcium carbonate  1 tablet Oral TID WC   carbamazepine  300 mg Oral BID    Chlorhexidine Gluconate Cloth  6 each Topical Daily   feeding supplement  1 Container Oral TID BM   gabapentin  300 mg Oral TID   heparin  5,000 Units Subcutaneous Q8H   levETIRAcetam  500 mg Oral BID   mouth rinse  15 mL Mouth Rinse BID   sodium chloride flush  3 mL Intravenous Q12H   Continuous Infusions:  sodium chloride 10 mL/hr at 06/01/20 0600   sodium chloride Stopped (05/31/20 1259)   piperacillin-tazobactam (ZOSYN)  IV 3.375 g (06/01/20 0934)   PRN Meds:.acetaminophen **OR** acetaminophen, docusate sodium, ondansetron **OR** ondansetron (ZOFRAN) IV, polyethylene glycol  Allergies as of 05/30/2020 - Review Complete 05/30/2020  Allergen Reaction Noted   Seasonal ic [cholestatin]  12/21/2014    Family History  Problem Relation Age of Onset   Breast cancer Mother     Social History   Socioeconomic History   Marital status: Single    Spouse name: Not on file   Number of children: Not on file   Years of education: Not on file   Highest education level: Not on file  Occupational History   Occupation: Part-time    Employer: DISABLED    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)  Tobacco Use   Smoking status: Never Smoker   Smokeless tobacco: Never Used  Substance and Sexual Activity   Alcohol use: No   Drug use: No   Sexual activity: Never  Other Topics Concern   Not on file  Social History Narrative   [employer] [facility] [phone]   Social Determinants of Health   Financial Resource Strain:    Difficulty of Paying Living Expenses:   Food Insecurity:    Worried About Programme researcher, broadcasting/film/video in the Last Year:    Barista in the Last Year:   Transportation Needs:    Freight forwarder (Medical):    Lack of Transportation (Non-Medical):   Physical Activity:    Days of Exercise per Week:    Minutes of Exercise per Session:   Stress:    Feeling of Stress :   Social Connections:    Frequency of  Communication with Friends and Family:    Frequency of Social Gatherings with Friends and Family:    Attends Religious Services:    Active Member of Clubs or Organizations:    Attends Engineer, structural:    Marital Status:   Intimate Partner Violence:    Fear of Current or Ex-Partner:    Emotionally Abused:    Physically Abused:    Sexually Abused:     Review of Systems: Review of Systems  Constitutional: Negative for chills and fever.  HENT: Negative for hearing loss and tinnitus.   Eyes: Negative for pain and redness.  Respiratory: Negative for cough and shortness of breath.  Cardiovascular: Negative for chest pain and palpitations.  Gastrointestinal: Positive for diarrhea. Negative for abdominal pain, blood in stool, constipation, heartburn, melena, nausea and vomiting.  Genitourinary: Negative for flank pain and hematuria.  Musculoskeletal: Negative for back pain and neck pain.  Skin: Negative for itching and rash.  Neurological: Positive for seizures. Negative for dizziness.  Endo/Heme/Allergies: Negative for polydipsia. Does not bruise/bleed easily.  Psychiatric/Behavioral: Negative for substance abuse. The patient is not nervous/anxious.     Physical Exam: Vital signs: Vitals:   06/01/20 1100 06/01/20 1200  BP: 111/64 112/67  Pulse: 78 74  Resp: 20 18  Temp: 98.4 F (36.9 C)   SpO2: 100% 100%   Last BM Date: 05/31/20  Physical Exam Vitals reviewed.  Constitutional:      General: She is not in acute distress.    Appearance: Normal appearance.     Comments: Sister at bedside  HENT:     Head: Normocephalic and atraumatic.     Mouth/Throat:     Mouth: Mucous membranes are moist.     Pharynx: Oropharynx is clear.  Eyes:     General: No scleral icterus.    Extraocular Movements: Extraocular movements intact.     Conjunctiva/sclera: Conjunctivae normal.  Cardiovascular:     Rate and Rhythm: Normal rate and regular rhythm.     Pulses:  Normal pulses.     Heart sounds: Normal heart sounds.  Pulmonary:     Effort: Pulmonary effort is normal. No respiratory distress.     Breath sounds: Normal breath sounds.  Abdominal:     General: Bowel sounds are normal. There is distension (mild).     Palpations: There is no mass.     Tenderness: There is no abdominal tenderness. There is no guarding or rebound.     Hernia: No hernia is present.     Comments: Abdomen firm but without peritoneal signs  Skin:    General: Skin is warm and dry.  Neurological:     General: No focal deficit present.     Mental Status: She is alert and oriented to person, place, and time.  Psychiatric:        Mood and Affect: Mood normal.        Behavior: Behavior normal.    GI:  Lab Results: Recent Labs    05/30/20 2307 05/31/20 0338 05/31/20 0730  WBC 6.2 6.7 8.6  HGB 9.7* 7.9* 8.8*  HCT 31.0* 25.4* 27.8*  PLT 397 308 369   BMET Recent Labs    05/30/20 2307 05/31/20 0338 05/31/20 0730  NA 136 138  --   K 3.4* 3.4*  --   CL 96* 103  --   CO2 30 27  --   GLUCOSE 113* 106*  --   BUN 14 14  --   CREATININE 0.73 0.66 0.74  CALCIUM 8.8* 8.2*  --    LFT Recent Labs    05/31/20 0338  PROT 4.3*  ALBUMIN 1.5*  AST 29  ALT 21  ALKPHOS 34*  BILITOT 0.5   PT/INR No results for input(s): LABPROT, INR in the last 72 hours.   Studies/Results: CT ABDOMEN PELVIS W CONTRAST  Result Date: 05/31/2020 CLINICAL DATA:  Abdominal abscess/infection suspected Diarrhea. Patient with history of vesicle vaginal fistula closure. Recent bowel obstruction. EXAM: CT ABDOMEN AND PELVIS WITH CONTRAST TECHNIQUE: Multidetector CT imaging of the abdomen and pelvis was performed using the standard protocol following bolus administration of intravenous contrast. CONTRAST:  OMNIPAQUE IOHEXOL 300 MG/ML  SOLN COMPARISON:  Multiple recent radiographs. FINDINGS: Lower chest: Small bilateral pleural effusions with compressive atelectasis. Mild wall thickening  of the distal esophagus. Small amount of contrast in the distal esophagus. Hepatobiliary: No focal liver abnormality is seen. No gallstones, gallbladder wall thickening, or biliary dilatation. Pancreas: No ductal dilatation or inflammation. Spleen: Normal in size without focal abnormality. Splenule at the hilum. Adrenals/Urinary Tract: Mild left adrenal thickening without dominant nodule. Normal right adrenal gland. No hydronephrosis or perinephric edema. Homogeneous renal enhancement with symmetric excretion on delayed phase imaging. There is some early excretion of IV contrast on initial exam. Subcentimeter low-density lesion in the upper left kidney is too small to characterize but likely cyst. Urinary bladder is partially distended. There is excreted IV contrast in the dependent urinary bladder. Stomach/Bowel: There is colonic wall thickening with pericolonic stranding involving the distal descending through sigmoid colon. Small amount of liquid stool in the distal colon. There is a moderate volume of stool in the remainder of the colon. There is mild transverse colonic redundancy. The appendix is not definitively visualized. There is no small bowel dilatation or evidence of obstruction. Stomach is unremarkable. Vascular/Lymphatic: Aortic atherosclerosis. No aortic aneurysm. Patent portal vein. There multiple prominent retroperitoneal nodes are all subcentimeter short axis. Reproductive: Post hysterectomy. Left ovary appears in situ and quiescent. 15 mm fluid density structure in the right adnexa is likely an ovarian cyst. Other: Pericolonic edema and fat stranding from the descending through the sigmoid colon. Small amount of free fluid in both pericolic gutters, left anterior pelvis, presacral region. There is presacral edema. 15 mm fluid density structure in the right pelvis is likely an ovarian cyst, however no prior exams are available to confirm chronicity, and there is mild adjacent stranding. No free  intra-abdominal air. Musculoskeletal: L3-L4 endplate sclerosis with endplate irregularity and erosions. Slight lateral translation of L3 on L4. mild L4 superior endplate compression fracture. Anterior osteophytes that are fragmented at this level. There is severe canal stenosis at this level. Probable remote sacral fracture. Additional endplate spurring and degenerative disc disease with facet hypertrophy throughout the lumbar spine. IMPRESSION: 1. Colonic wall thickening with pericolonic edema and fat stranding involving the distal descending through sigmoid colon consistent with colitis, likely infectious or inflammatory. 2. Small amount of free fluid in both pericolic gutters, left anterior pelvis, and presacral region, likely reactive. There is a 15 mm fluid density structure in the right adnexa, felt to represent a small ovarian cyst, however there is mild adjacent stranding in this area and no prior exams are available to confirm chronicity. Small abscess is difficult to exclude, but felt less likely. 3. Small bilateral pleural effusions with compressive atelectasis. 4. L3-L4 endplate sclerosis with endplate irregularity and erosions. No prior exams are available for comparison to establish chronicity. Given there multiple prominent retroperitoneal lymph nodes and presacral edema, consider further evaluation with lumbar spine MRI. 5. Mild wall thickening of the distal esophagus containing a small amount of enteric contrast, suggesting reflux. Aortic Atherosclerosis (ICD10-I70.0). Electronically Signed   By: Narda Rutherford M.D.   On: 05/31/2020 01:49   DG CHEST PORT 1 VIEW  Result Date: 05/31/2020 CLINICAL DATA:  Hypoxia EXAM: PORTABLE CHEST 1 VIEW COMPARISON:  None. FINDINGS: The heart size and mediastinal contours are within normal limits. Both lungs are clear. The visualized skeletal structures are unremarkable. IMPRESSION: No acute airspace disease. Electronically Signed   By: Deatra Robinson M.D.   On:  05/31/2020 01:29   VAS [place] LOWER EXTREMITY VENOUS (  DVT)  Result Date: 06/01/2020  Lower Venous DVTStudy Indications: Edema.  Limitations: Patient could not tolerate compressions. Patient was combative, threatening, non-compliant. Comparison Study: No prior studies. Performing Technologist: Jean Rosenthal  Examination Guidelines: A complete evaluation includes B-mode imaging, spectral Doppler, color Doppler, and power Doppler as needed of all accessible portions of each vessel. Bilateral testing is considered an integral part of a complete examination. Limited examinations for reoccurring indications may be performed as noted. The reflux portion of the exam is performed with the patient in reverse Trendelenburg.  +---------+---------------+---------+-----------+---------------+--------------+  RIGHT     Compressibility Phasicity Spontaneity Properties      Thrombus Aging  +---------+---------------+---------+-----------+---------------+--------------+  CFV       Full            Yes       Yes                                         +---------+---------------+---------+-----------+---------------+--------------+  SFJ       Full                                                                  +---------+---------------+---------+-----------+---------------+--------------+  FV Prox                             Yes         Patent by color                 +---------+---------------+---------+-----------+---------------+--------------+  FV Mid                              Yes         Patent by color                 +---------+---------------+---------+-----------+---------------+--------------+  FV Distal                           Yes         Patent by color                 +---------+---------------+---------+-----------+---------------+--------------+  PFV                                 Yes         Patent by color                 +---------+---------------+---------+-----------+---------------+--------------+  PTV                                  Yes         Patent by color                 +---------+---------------+---------+-----------+---------------+--------------+  PERO                                Yes  Patent by color                 +---------+---------------+---------+-----------+---------------+--------------+   +---------+---------------+---------+-----------+---------------+--------------+  LEFT      Compressibility Phasicity Spontaneity Properties      Thrombus Aging  +---------+---------------+---------+-----------+---------------+--------------+  CFV                                 Yes         Patent by color                 +---------+---------------+---------+-----------+---------------+--------------+  SFJ                                 Yes         Patent by color                 +---------+---------------+---------+-----------+---------------+--------------+  FV Prox                             Yes         Patent by color                 +---------+---------------+---------+-----------+---------------+--------------+  FV Mid                              Yes         Patent by color                 +---------+---------------+---------+-----------+---------------+--------------+  FV Distal                           Yes         Patent by color                 +---------+---------------+---------+-----------+---------------+--------------+  PFV                                                             Not visualized  +---------+---------------+---------+-----------+---------------+--------------+  POP                                                             Not visualized  +---------+---------------+---------+-----------+---------------+--------------+  PTV                                 Yes         Patent by color                 +---------+---------------+---------+-----------+---------------+--------------+  PERO                                Yes         Patent by color                  +---------+---------------+---------+-----------+---------------+--------------+  Summary: RIGHT: - There is no evidence of deep vein thrombosis in the lower extremity. However, portions of this examination were limited- see technologist comments above.  LEFT: - There is no evidence of deep vein thrombosis in the lower extremity. However, portions of this examination were limited- see technologist comments above.  *See table(s) above for measurements and observations. Electronically signed by Fabienne Bruns MD on 06/01/2020 at 11:31:17 AM.    Final     Impression: Diarrhea x 3 weeks. Ischemia vs. Infectious. -C diff negative and GI pathogen panel from this hospitalization are pending (negative 05/17/20 during prior admission) -CT 6/28: Colonic wall thickening with pericolonic edema and fat stranding involving the distal descending through sigmoid colon consistent with colitis, likely infectious or inflammatory. -No leukocytosis: WBCs 8.6 6/28 -TSH normal 6/28  Hypokalemia: K+ 3.4 as of 6/28  Macrocytic anemia: Hgb 8.8 as of 6/28, no signs of GI bleeding  Plan: Await C. Diff and GI pathogen panel.  If negative and diarrhea persists, consider flexible sigmoidoscopy vs. colonoscopy.  Patient may not drink/tolerate prep so flexible sigmoidoscopy may be the best option.  Continue supportive care.  [facility] will follow.   LOS: 1 day   Edrick Kins  PA-C 06/01/2020, 1:15 PM  Contact #  [phone]

## 2020-06-01 NOTE — Consult Note (Signed)
Referring Provider: Sarah Groce, NP °Primary Care Physician:  Anderson, [name], [name] °Primary Gastroenterologist:  [facility] (saw Dr. Edwards in 2010) ° °Reason for Consultation:  diarrhea ° °HPI: Nicole [name] is a 69 y.o. female with past medical history noted below to include recent small bowel obstruction vs. ileus and diarrhea presenting for consultation of diarrhea. ° °Patient has had diarrhea for at least 3 weeks. She denies any rectal bleeding, melena, or abdominal pain.  She further denies any dysphagia, GERD, changes in appetite, unexplained weight loss, nausea, vomiting, and hematemesis.   ° °Patient's sister and guarding [name] was at bedside.  Reports patient has had diarrhea for the past 3 weeks.  At the beginning, there was one day in which the diarrhea had a small amount of mucus and blood.  Other than that, patient has had brown stools of "soup" consistency.  Patient took imodium one day with some improvement, but after the imodium wore off, she continued to have diarrhea. ° °No prior colonoscopy.  EGD in 2010 for suspected GERD was unremarkable. ° °Patient was recently on Vancomycin for lower extremity wound, last dose yesterday.  Remains on Zosyn. ° °Patient was hospitalized from 6/14 to 05/27/20 with SBO vs. Ileus, resolved with small bowel protocol. 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 Love (should not take anti-histamines)  ° ° °Past Surgical History:  °Procedure Laterality Date  °• VESICOVAGINAL FISTULA CLOSURE W/ TAH    ° ° °Prior to Admission medications   °Medication Sig Start Date End Date Taking? Authorizing Provider  °atorvastatin (LIPITOR) 10 MG tablet Take 10 mg by mouth every Monday, Wednesday, and  Friday.  12/08/19  Yes [provider]  °budesonide (PULMICORT FLEXHALER) 180 MCG/ACT inhaler Inhale 1 puff into the lungs 2 (two) times daily. 09/10/19  Yes [provider]  °busPIRone (BUSPAR) 15 MG tablet Take 15 mg by mouth 2 (two) times daily.     Yes [provider]  °calcium carbonate (OS-CAL) 600 MG TABS tablet Take 600 mg by mouth 3 (three) times daily with meals.    Yes [provider]  °carbamazepine (TEGRETOL XR) 100 MG 12 hr tablet Take 3 tablets (300 mg total) by mouth 2 (two) times daily. Reducing due to low sodium levels recently. °Patient taking differently: Take 300 mg by mouth 2 (two) times daily.  04/10/18  Yes Athar, Saima, MD  °Cholecalciferol (VITAMIN D3) 2000 units TABS Take 2,000 Units by mouth daily.    Yes [provider]  °diclofenac (VOLTAREN) 75 MG EC tablet Take 75 mg by mouth 2 (two) times daily.  07/28/16  Yes [provider]  °gabapentin (NEURONTIN) 300 MG capsule Take 1 capsule (300 mg total) by mouth in the morning, at noon, and at bedtime. Taken tid on weekends, taken qid on weekdays. °Patient taking differently: Take 300 mg by mouth in the morning, at noon, and at bedtime.  05/27/20  Yes Goodrich, Daniel P, MD  °levETIRAcetam (KEPPRA) 500 MG tablet Take 1 tablet (500 mg total) by mouth 2 (two) times daily. 04/10/18  Yes Athar, Saima, MD  °Multiple Vitamins-Minerals (CERTAVITE/ANTIOXIDANTS)   TABS Take 1 tablet by mouth daily. Advance , daily    Yes [provider]  °pantoprazole (PROTONIX) 40 MG tablet Take 40 mg by mouth daily.  08/04/13  Yes [provider]  °Selenium 200 MCG TBCR Take 1 tablet by mouth daily.   Yes [provider]  °Wheat Dextrin (BENEFIBER) CHEW Chew 1 tablet by mouth daily.    Yes [provider]  ° ° °Scheduled Meds: °• atorvastatin  10 mg Oral Q M,W,F  °• budesonide  0.25 mg Inhalation BID  °• calcium carbonate  1 tablet Oral TID WC  °• carbamazepine  300 mg Oral BID  °•  Chlorhexidine Gluconate Cloth  6 each Topical Daily  °• feeding supplement  1 Container Oral TID BM  °• gabapentin  300 mg Oral TID  °• heparin  5,000 Units Subcutaneous Q8H  °• levETIRAcetam  500 mg Oral BID  °• mouth rinse  15 mL Mouth Rinse BID  °• sodium chloride flush  3 mL Intravenous Q12H  ° °Continuous Infusions: °• sodium chloride 10 mL/hr at 06/01/20 0600  °• sodium chloride Stopped (05/31/20 1259)  °• piperacillin-tazobactam (ZOSYN)  IV 3.375 g (06/01/20 0934)  ° °PRN Meds:.acetaminophen **OR** acetaminophen, docusate sodium, ondansetron **OR** ondansetron (ZOFRAN) IV, polyethylene glycol ° °Allergies as of 05/30/2020 - Review Complete 05/30/2020  °Allergen Reaction Noted  °• Seasonal ic [cholestatin]  12/21/2014  ° ° °Family History  °Problem Relation Age of Onset  °• Breast cancer Mother   ° ° °Social History  ° °Socioeconomic History  °• Marital status: Single  °  Spouse name: Not on file  °• Number of children: Not on file  °• Years of education: Not on file  °• Highest education level: Not on file  °Occupational History  °• Occupation: Part-time  °  Employer: DISABLED  °  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)  °Tobacco Use  °• Smoking status: Never Smoker  °• Smokeless tobacco: Never Used  °Substance and Sexual Activity  °• Alcohol use: No  °• Drug use: No  °• Sexual activity: Never  °Other Topics Concern  °• Not on file  °Social History Narrative  ° [facility] [phone]  ° °Social Determinants of Health  ° °Financial Resource Strain:   °• Difficulty of Paying Living Expenses:   °Food Insecurity:   °• Worried About Running Out of Food in the Last Year:   °• Ran Out of Food in the Last Year:   °Transportation Needs:   °• Lack of Transportation (Medical):   °• Lack of Transportation (Non-Medical):   °Physical Activity:   °• Days of Exercise per Week:   °• Minutes of Exercise per Session:   °Stress:   °• Feeling of Stress :   °Social Connections:   °• Frequency of  Communication with Friends and Family:   °• Frequency of Social Gatherings with Friends and Family:   °• Attends Religious Services:   °• Active Member of Clubs or Organizations:   °• Attends Club or Organization Meetings:   °• Marital Status:   °Intimate Partner Violence:   °• Fear of Current or Ex-Partner:   °• Emotionally Abused:   °• Physically Abused:   °• Sexually Abused:   ° ° °Review of Systems: Review of Systems  °Constitutional: Negative for chills and fever.  °HENT: Negative for hearing loss and tinnitus.   °Eyes: Negative for pain and redness.  °Respiratory: Negative for cough and shortness of breath.   °  Cardiovascular: Negative for chest pain and palpitations.  °Gastrointestinal: Positive for diarrhea. Negative for abdominal pain, blood in stool, constipation, heartburn, melena, nausea and vomiting.  °Genitourinary: Negative for flank pain and hematuria.  °Musculoskeletal: Negative for back pain and neck pain.  °Skin: Negative for itching and rash.  °Neurological: Positive for seizures. Negative for dizziness.  °Endo/Heme/Allergies: Negative for polydipsia. Does not bruise/bleed easily.  °Psychiatric/Behavioral: Negative for substance abuse. The patient is not nervous/anxious.   ° ° °Physical Exam: °Vital signs: °Vitals:  ° 06/01/20 1100 06/01/20 1200  °BP: 111/64 112/67  °Pulse: 78 74  °Resp: 20 18  °Temp: 98.4 °F (36.9 °C)   °SpO2: 100% 100%  ° °Last BM Date: 05/31/20 ° °Physical Exam °Vitals reviewed.  °Constitutional:   °   General: She is not in acute distress. °   Appearance: Normal appearance.  °   Comments: Sister at bedside  °HENT:  °   Head: Normocephalic and atraumatic.  °   Mouth/Throat:  °   Mouth: Mucous membranes are moist.  °   Pharynx: Oropharynx is clear.  °Eyes:  °   General: No scleral icterus. °   Extraocular Movements: Extraocular movements intact.  °   Conjunctiva/sclera: Conjunctivae normal.  °Cardiovascular:  °   Rate and Rhythm: Normal rate and regular rhythm.  °   Pulses:  Normal pulses.  °   Heart sounds: Normal heart sounds.  °Pulmonary:  °   Effort: Pulmonary effort is normal. No respiratory distress.  °   Breath sounds: Normal breath sounds.  °Abdominal:  °   General: Bowel sounds are normal. There is distension (mild).  °   Palpations: There is no mass.  °   Tenderness: There is no abdominal tenderness. There is no guarding or rebound.  °   Hernia: No hernia is present.  °   Comments: Abdomen firm but without peritoneal signs  °Skin: °   General: Skin is warm and dry.  °Neurological:  °   General: No focal deficit present.  °   Mental Status: She is alert and oriented to person, place, and time.  °Psychiatric:     °   Mood and Affect: Mood normal.     °   Behavior: Behavior normal.  ° ° °GI:  °Lab Results: °Recent Labs  °  05/30/20 °2307 05/31/20 °0338 05/31/20 °0730  °WBC 6.2 6.7 8.6  °HGB 9.7* 7.9* 8.8*  °HCT 31.0* 25.4* 27.8*  °PLT 397 308 369  ° °BMET °Recent Labs  °  05/30/20 °2307 05/31/20 °0338 05/31/20 °0730  °NA 136 138  --   °K 3.4* 3.4*  --   °CL 96* 103  --   °CO2 30 27  --   °GLUCOSE 113* 106*  --   °BUN 14 14  --   °CREATININE 0.73 0.66 0.74  °CALCIUM 8.8* 8.2*  --   ° °LFT °Recent Labs  °  05/31/20 °0338  °PROT 4.3*  °ALBUMIN 1.5*  °AST 29  °ALT 21  °ALKPHOS 34*  °BILITOT 0.5  ° °PT/INR °No results for input(s): LABPROT, INR in the last 72 hours. ° ° °Studies/Results: °CT ABDOMEN PELVIS W CONTRAST ° °Result Date: 05/31/2020 °CLINICAL DATA:  Abdominal abscess/infection suspected Diarrhea. Patient with history of vesicle vaginal fistula closure. Recent bowel obstruction. EXAM: CT ABDOMEN AND PELVIS WITH CONTRAST TECHNIQUE: Multidetector CT imaging of the abdomen and pelvis was performed using the standard protocol following bolus administration of intravenous contrast. CONTRAST:  100mL OMNIPAQUE IOHEXOL 300 MG/ML    SOLN COMPARISON:  Multiple recent radiographs. FINDINGS: Lower chest: Small bilateral pleural effusions with compressive atelectasis. Mild wall thickening  of the distal esophagus. Small amount of contrast in the distal esophagus. Hepatobiliary: No focal liver abnormality is seen. No gallstones, gallbladder wall thickening, or biliary dilatation. Pancreas: No ductal dilatation or inflammation. Spleen: Normal in size without focal abnormality. Splenule at the hilum. Adrenals/Urinary Tract: Mild left adrenal thickening without dominant nodule. Normal right adrenal gland. No hydronephrosis or perinephric edema. Homogeneous renal enhancement with symmetric excretion on delayed phase imaging. There is some early excretion of IV contrast on initial exam. Subcentimeter low-density lesion in the upper left kidney is too small to characterize but likely cyst. Urinary bladder is partially distended. There is excreted IV contrast in the dependent urinary bladder. Stomach/Bowel: There is colonic wall thickening with pericolonic stranding involving the distal descending through sigmoid colon. Small amount of liquid stool in the distal colon. There is a moderate volume of stool in the remainder of the colon. There is mild transverse colonic redundancy. The appendix is not definitively visualized. There is no small bowel dilatation or evidence of obstruction. Stomach is unremarkable. Vascular/Lymphatic: Aortic atherosclerosis. No aortic aneurysm. Patent portal vein. There multiple prominent retroperitoneal nodes are all subcentimeter short axis. Reproductive: Post hysterectomy. Left ovary appears in situ and quiescent. 15 mm fluid density structure in the right adnexa is likely an ovarian cyst. Other: Pericolonic edema and fat stranding from the descending through the sigmoid colon. Small amount of free fluid in both pericolic gutters, left anterior pelvis, presacral region. There is presacral edema. 15 mm fluid density structure in the right pelvis is likely an ovarian cyst, however no prior exams are available to confirm chronicity, and there is mild adjacent stranding. No free  intra-abdominal air. Musculoskeletal: L3-L4 endplate sclerosis with endplate irregularity and erosions. Slight lateral translation of L3 on L4. mild L4 superior endplate compression fracture. Anterior osteophytes that are fragmented at this level. There is severe canal stenosis at this level. Probable remote sacral fracture. Additional endplate spurring and degenerative disc disease with facet hypertrophy throughout the lumbar spine. IMPRESSION: 1. Colonic wall thickening with pericolonic edema and fat stranding involving the distal descending through sigmoid colon consistent with colitis, likely infectious or inflammatory. 2. Small amount of free fluid in both pericolic gutters, left anterior pelvis, and presacral region, likely reactive. There is a 15 mm fluid density structure in the right adnexa, felt to represent a small ovarian cyst, however there is mild adjacent stranding in this area and no prior exams are available to confirm chronicity. Small abscess is difficult to exclude, but felt less likely. 3. Small bilateral pleural effusions with compressive atelectasis. 4. L3-L4 endplate sclerosis with endplate irregularity and erosions. No prior exams are available for comparison to establish chronicity. Given there multiple prominent retroperitoneal lymph nodes and presacral edema, consider further evaluation with lumbar spine MRI. 5. Mild wall thickening of the distal esophagus containing a small amount of enteric contrast, suggesting reflux. Aortic Atherosclerosis (ICD10-I70.0). Electronically Signed   By: Melanie  Sanford M.D.   On: 05/31/2020 01:49  ° °DG CHEST PORT 1 VIEW ° °Result Date: 05/31/2020 °CLINICAL DATA:  Hypoxia EXAM: PORTABLE CHEST 1 VIEW COMPARISON:  None. FINDINGS: The heart size and mediastinal contours are within normal limits. Both lungs are clear. The visualized skeletal structures are unremarkable. IMPRESSION: No acute airspace disease. Electronically Signed   By: Kevin  Herman M.D.   On:  05/31/2020 01:29  ° °VAS US LOWER EXTREMITY VENOUS (  DVT) ° °Result Date: 06/01/2020 ° Lower Venous DVTStudy Indications: Edema.  Limitations: Patient could not tolerate compressions. Patient was combative, threatening, non-compliant. Comparison Study: No prior studies. Performing Technologist: Rachel Hodge  Examination Guidelines: A complete evaluation includes B-mode imaging, spectral Doppler, color Doppler, and power Doppler as needed of all accessible portions of each vessel. Bilateral testing is considered an integral part of a complete examination. Limited examinations for reoccurring indications may be performed as noted. The reflux portion of the exam is performed with the patient in reverse Trendelenburg.  +---------+---------------+---------+-----------+---------------+--------------+  RIGHT     Compressibility Phasicity Spontaneity Properties      Thrombus Aging  +---------+---------------+---------+-----------+---------------+--------------+  CFV       Full            Yes       Yes                                         +---------+---------------+---------+-----------+---------------+--------------+  SFJ       Full                                                                  +---------+---------------+---------+-----------+---------------+--------------+  FV Prox                             Yes         Patent by color                 +---------+---------------+---------+-----------+---------------+--------------+  FV Mid                              Yes         Patent by color                 +---------+---------------+---------+-----------+---------------+--------------+  FV Distal                           Yes         Patent by color                 +---------+---------------+---------+-----------+---------------+--------------+  PFV                                 Yes         Patent by color                 +---------+---------------+---------+-----------+---------------+--------------+  PTV                                  Yes         Patent by color                 +---------+---------------+---------+-----------+---------------+--------------+  PERO                                Yes           Patent by color                 +---------+---------------+---------+-----------+---------------+--------------+   +---------+---------------+---------+-----------+---------------+--------------+  LEFT      Compressibility Phasicity Spontaneity Properties      Thrombus Aging  +---------+---------------+---------+-----------+---------------+--------------+  CFV                                 Yes         Patent by color                 +---------+---------------+---------+-----------+---------------+--------------+  SFJ                                 Yes         Patent by color                 +---------+---------------+---------+-----------+---------------+--------------+  FV Prox                             Yes         Patent by color                 +---------+---------------+---------+-----------+---------------+--------------+  FV Mid                              Yes         Patent by color                 +---------+---------------+---------+-----------+---------------+--------------+  FV Distal                           Yes         Patent by color                 +---------+---------------+---------+-----------+---------------+--------------+  PFV                                                             Not visualized  +---------+---------------+---------+-----------+---------------+--------------+  POP                                                             Not visualized  +---------+---------------+---------+-----------+---------------+--------------+  PTV                                 Yes         Patent by color                 +---------+---------------+---------+-----------+---------------+--------------+  PERO                                Yes         Patent by color                  +---------+---------------+---------+-----------+---------------+--------------+       Summary: RIGHT: - There is no evidence of deep vein thrombosis in the lower extremity. However, portions of this examination were limited- see technologist comments above.  LEFT: - There is no evidence of deep vein thrombosis in the lower extremity. However, portions of this examination were limited- see technologist comments above.  *See table(s) above for measurements and observations. Electronically signed by Charles Fields MD on 06/01/2020 at 11:31:17 AM.    Final   ° ° °Impression: °Diarrhea x 3 weeks. Ischemia vs. Infectious. °-C diff negative and GI pathogen panel from this hospitalization are pending (negative 05/17/20 during prior admission) °-CT 6/28: Colonic wall thickening with pericolonic edema and fat stranding involving the distal descending through sigmoid colon consistent with colitis, likely infectious or inflammatory. °-No leukocytosis: WBCs 8.6 6/28 °-TSH normal 6/28 ° °Hypokalemia: K+ 3.4 as of 6/28 ° °Macrocytic anemia: Hgb 8.8 as of 6/28, no signs of GI bleeding ° °Plan: °Await C. Diff and GI pathogen panel.  If negative and diarrhea persists, consider flexible sigmoidoscopy vs. colonoscopy.  Patient may not drink/tolerate prep so flexible sigmoidoscopy may be the best option. ° °Continue supportive care. ° °[facility] will follow. ° ° LOS: 1 day  ° °Joachim Carton Baron-Johnson  PA-C °06/01/2020, 1:15 PM ° °Contact #  [phone] °

## 2020-06-01 NOTE — Progress Notes (Signed)
NAME:  Nicole [name], MRN:  [id number], DOB:  1951-10-07, LOS: 1 ADMISSION DATE:  05/30/2020, CONSULTATION DATE:  06/01/20 REFERRING MD:  EDP, CHIEF COMPLAINT:  Diarrhea   Brief History   69 y.o. 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 She was initially admitted to the hospitalist service, but was persistently hypotensive and required vasopressors, so PCCM consulted for admission.  History of present illness   Nicole [name] is a 69 y.o.  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  She was C. Diff negative during last admission.  Family denied any nausea, vomiting or fever and stool has been non-bloody. Pt has also been weaker and more confused.  In the ED, pt was febrile to 101.9 and hypotensive, received 2.5L IVF, albumin, ancef and zosyn.  Labs did not show any leukocytosis, lactic acidosis or significant metabolic derangement.  CT Abd/pelvis showed colitis and LLR noted to have erythema and edema.  She was given Ancef and Zosyn and admitted to internal medicine, however pt was persistently hypotensive so PCCM asked to admit to the ICU for vasopressors  Past Medical History   has a past medical history of Abnormal WBC count, Allergic rhinitis, Alopecia, Discoid lupus, Hyperlipidemia, Hypertension, Menopause, Moderate mental retardation, Obesity, Osteoporosis, Pulmonary nodules, and Seizures (HCC).   Significant Hospital Events   6/28 Admit to PCCM  Consults:  PCCM 6/29>> GI Deboraha Sprang)  Procedures:    Significant Diagnostic Tests:  06/01/20 CXR >>no acute findings 06/01/20 CT abd/pelvis >>Colonic wall thickening with pericolonic edema and fat stranding involving the distal  descending through sigmoid colon consistent with colitis, likely infectious or inflammatory. 06/01/2020>> LE Dop[plers>. Negative but limited exam  Micro Data:  6/28 Sars-Cov-2>>negative 6/28 MRSA screen>>negative 6/28 BCx2>> 6/28 GI panel>>negative 6/28 C. Diff>> 6/28 UA>> Negative for Nitrites , few bacteria     Antimicrobials:  Ancef 6/27 only Zosyn 6/27- Vancomycin 6/26- 6/29  Interim history/subjective:  Awake, answering questions and following commands.  States she feels better Off pressors Lactate 1.2 on 6/28 350 cc's of stool from rectal tube Refused lab work this am   Objective   Blood pressure (!) 100/58, pulse 78, temperature 97.9 F (36.6 C), temperature source Oral, resp. rate 13, height 5\' 7"  (1.702 m), weight 90.5 kg, SpO2 100 %.        Intake/Output Summary (Last 24 hours) at 06/01/2020 0957 Last data filed at 06/01/2020 0900 Gross per 24 hour  Intake 1961.46 ml  Output 700 ml  Net 1261.46 ml   Filed Weights   05/31/20 1145 06/01/20 0500  Weight: 87.2 kg 90.5 kg      General:  Elderly F, no acute distress, awake and alert HEENT: NCAT, MM pink/moist Neuro:Awake and alert, MAE x 4, A&O x 3, following commands CV: s1s2 rrr, no m/r/g PULM:  Bilateral chest excursion, diminished per bases GI: soft, bsx4 active , NT, slightly distended Extremities: warm/dry, 1+ edema , brisk cap refill Skin: no rashes or lesions, RLE edema and ? erethema   Resolved Hospital Problem list     Assessment & Plan:   Severe Sepsis secondary to acute Colitis with RLE cellulitis( ?) Continued diarrhea per rectal tube  Off pressors 6/29 Pt received 30cc/kg ICF with continued hypotension -Follow blood cultures, C. Diff and GI pathogen panels -Levophed to maintain MAP >65, currently on , if requiring >27mcg may need CVC, consent obtained from family - Got  one dose solucortef - Will d/c vanc today, and if she remains stable will d/c zosyn 6/30 - LE doppler to  eval for DVT>> negative but limited study  HTN Holding home medications as BP remains soft off pressors   L3-L4 endplate sclerosis Hospitalist discussed with family, hold off further imaging while hypotensive Can address when more stable and out of ICU  Seizure Disorder -Continue Keppra and Tegretol  -seizure precautions  Will transfer out of ICU and back to [facility] for management  for pick up tomorrow 6/30 am Will consult GI to evaluate acute colitis  Best practice:  Diet: NPO Pain/Anxiety/Delirium protocol (if indicated): n/a VAP protocol (if indicated): n/a DVT prophylaxis: heparin GI prophylaxis: n/a Glucose control: SSI Mobility:bed rest Code Status: Full code,  DNR Family Communication: No family at bedside Disposition: Progressive care  Labs   CBC: Recent Labs  Lab 05/26/20 0418 05/30/20 2307 05/31/20 0338 05/31/20 0730  WBC 6.5 6.2 6.7 8.6  NEUTROABS 4.9 5.1 6.0  --   HGB 9.6* 9.7* 7.9* 8.8*  HCT 29.3* 31.0* 25.4* 27.8*  MCV 99.3 101.3* 102.4* 101.8*  PLT 275 397 308 369    Basic Metabolic Panel: Recent Labs  Lab 05/25/20 1442 05/26/20 0418 05/27/20 0424 05/30/20 2307 05/31/20 0338 05/31/20 0730  NA 138 134*  --  136 138  --   K 3.2* 3.2*  --  3.4* 3.4*  --   CL 100 97*  --  96* 103  --   CO2 28 27  --  30 27  --   GLUCOSE 145* 106*  --  113* 106*  --   BUN 14 14  --  14 14  --   CREATININE 0.75 0.61  --  0.73 0.66 0.74  CALCIUM 8.3* 7.4*  --  8.8* 8.2*  --   MG 1.6* 2.0 1.8  --  1.7  --   PHOS  --   --   --   --  3.2  --    GFR: Estimated Creatinine Clearance: 75.6 mL/min (by C-G formula based on SCr of 0.74 mg/dL). Recent Labs  Lab 05/26/20 0418 05/30/20 2307 05/31/20 0338 05/31/20 0446 05/31/20 0447 05/31/20 0730  PROCALCITON  --   --   --  0.95  --   --   WBC 6.5 6.2 6.7  --   --  8.6  LATICACIDVEN  --  1.9  --   --  1.2  --     Liver Function Tests: Recent Labs  Lab 05/26/20 0418 05/30/20 2307 05/31/20 0338  AST 60* 37  29  ALT 37 27 21  ALKPHOS 45 47 34*  BILITOT 0.8 0.3 0.5  PROT 5.8* 5.3* 4.3*  ALBUMIN 1.8* 1.6* 1.5*   No results for input(s): LIPASE, AMYLASE in the last 168 hours. No results for input(s): AMMONIA in the last 168 hours.  ABG No results found for: PHART, PCO2ART, PO2ART, HCO3, TCO2, ACIDBASEDEF, O2SAT   Coagulation Profile: No results for input(s): INR, PROTIME in the last 168 hours.  Cardiac Enzymes: No results for input(s): CKTOTAL, CKMB, CKMBINDEX, TROPONINI in the last 168 hours.  HbA1C: No results found for: HGBA1C  CBG: Recent Labs  Lab 05/31/20 1146  GLUCAP 144*    Review of  Systems:   Unable to obtain secondary to mental status  Past Medical History  She,  has a past medical history of Abnormal WBC count, Allergic rhinitis, Alopecia, Discoid lupus, Hyperlipidemia, Hypertension, Menopause, Moderate mental retardation, Obesity, Osteoporosis, Pulmonary nodules, and Seizures (HCC).   Surgical History    Past Surgical History:  Procedure Laterality Date  . VESICOVAGINAL FISTULA CLOSURE W/ TAH       Social History   reports that she has never smoked. She has never used smokeless tobacco. She reports that she does not drink alcohol and does not use drugs.   Family History   Her family history includes Breast cancer in her mother.   Allergies Allergies  Allergen Reactions  . Seasonal Ic [Cholestatin]     Runny nose     Home Medications  Prior to Admission medications   Medication Sig Start Date End Date Taking? Authorizing Provider  atorvastatin (LIPITOR) 10 MG tablet Take 10 mg by mouth every Monday, Wednesday, and Friday.  12/08/19  Yes [provider]  budesonide (PULMICORT FLEXHALER) 180 MCG/ACT inhaler Inhale 1 puff into the lungs 2 (two) times daily. 09/10/19  Yes [provider]  busPIRone (BUSPAR) 15 MG tablet Take 15 mg by mouth 2 (two) times daily.     Yes [provider]  calcium carbonate (OS-CAL) 600 MG TABS tablet  Take 600 mg by mouth 3 (three) times daily with meals.    Yes [provider]  carbamazepine (TEGRETOL XR) 100 MG 12 hr tablet Take 3 tablets (300 mg total) by mouth 2 (two) times daily. Reducing due to low sodium levels recently. Patient taking differently: Take 300 mg by mouth 2 (two) times daily.  04/10/18  Yes Huston Foley, MD  Cholecalciferol (VITAMIN D3) 2000 units TABS Take 2,000 Units by mouth daily.    Yes [provider]  diclofenac (VOLTAREN) 75 MG EC tablet Take 75 mg by mouth 2 (two) times daily.  07/28/16  Yes [provider]  gabapentin (NEURONTIN) 300 MG capsule Take 1 capsule (300 mg total) by mouth in the morning, at noon, and at bedtime. Taken tid on weekends, taken qid on weekdays. Patient taking differently: Take 300 mg by mouth in the morning, at noon, and at bedtime.  05/27/20  Yes Standley Brooking, MD  levETIRAcetam (KEPPRA) 500 MG tablet Take 1 tablet (500 mg total) by mouth 2 (two) times daily. 04/10/18  Yes Huston Foley, MD  Multiple Vitamins-Minerals (CERTAVITE/ANTIOXIDANTS) TABS Take 1 tablet by mouth daily. Advance , daily    Yes [provider]  pantoprazole (PROTONIX) 40 MG tablet Take 40 mg by mouth daily.  08/04/13  Yes [provider]  Selenium 200 MCG TBCR Take 1 tablet by mouth daily.   Yes [provider]  Wheat Dextrin (BENEFIBER) CHEW Chew 1 tablet by mouth daily.    Yes [provider]     Critical care time: 53 minutes    Bevelyn Ngo, MSN, AGACNP-BC [facility] Pulmonary/Critical Care Medicine See Amion for personal pager PCCM on call pager [phone] 06/01/2020 10:45 AM

## 2020-06-01 NOTE — Progress Notes (Signed)
Pt refusing all labs this AM, CCM MD aware.

## 2020-06-01 NOTE — Progress Notes (Signed)
Lower extremity venous study completed.  ° °See Cv Proc for preliminary results.  ° °Nicole [name] ° °

## 2020-06-01 NOTE — Progress Notes (Signed)
OT Cancellation Note  Patient Details Name: GABRIELLIA REMPEL MRN: [id number] DOB: 1951/04/27   Cancelled Treatment:    Reason Eval/Treat Not Completed: Patient declined, no reason specified Per PT, pt adamantly declining all therapies this date. OT will re-attempt as time allows and pt is appropriate.   [facility] OTR/L Acute Rehabilitation Services Office: [phone]   Rebeca Alert 06/01/2020, 1:10 PM

## 2020-06-01 NOTE — Progress Notes (Addendum)
CSW received consult for patient for SNF placement. CSW aware that patient resides at [facility] in [place] and that she has a legal guardian. Patient is active with [facility] services.  The patient has not been seen by PT for a formal recommendation at this time - CSW will wait for recommendation then will proceed with discharge planning.  Edwin Dada, MSW, LCSW-A Transitions of Care  Clinical Social Worker  [facility] Emergency Departments  Medical ICU [phone]

## 2020-06-01 NOTE — Progress Notes (Signed)
Phlebotomy tried 3x to draw pts blood, unable to get. Relayed to CCM MD.

## 2020-06-01 NOTE — Progress Notes (Signed)
Patient refused morning labs, stating "I'm tired of getting stuck, I got labs yesterday, and I bruise easy". This RN attempted to explain to patient the importance of the labs but patient still refused. Lab will come by later to attempt labs again. Will continue to monitor.

## 2020-06-01 NOTE — Progress Notes (Signed)
PT Cancellation Note  Patient Details Name: Nicole [name] MRN: [id number] DOB: [birth year]   Cancelled Treatment:    Reason Eval/Treat Not Completed: Patient declined, no reason specified.  Pt adamantly refused to participate. 06/01/2020  Jacinto Halim., PT Acute Rehabilitation Services [phone]  (pager) [phone]  (office)   [name] 06/01/2020, 12:58 PM

## 2020-06-01 NOTE — Progress Notes (Signed)
Pt w/ no urine output yet this shift. Bladder scan only showing 150.  Relayed to CCM MD.  Now that pts sister is here, will retry drawing labs

## 2020-06-02 LAB — GASTROINTESTINAL PANEL BY PCR, STOOL (REPLACES STOOL CULTURE)

## 2020-06-02 MED ORDER — POTASSIUM CHLORIDE CRYS ER 20 MEQ PO TBCR
40.0000 meq | EXTENDED_RELEASE_TABLET | Freq: Once | ORAL | Status: DC
  Filled 2020-06-02: qty 2

## 2020-06-02 MED ORDER — ADULT MULTIVITAMIN W/MINERALS CH
1.0000 | ORAL_TABLET | Freq: Every day | ORAL | Status: DC
  Administered 2020-06-03 – 2020-06-17 (×15): 1 via ORAL
  Filled 2020-06-02 (×15): qty 1

## 2020-06-02 NOTE — Progress Notes (Signed)
Pt refusing Labs. States that her hands already hurt from bruises from needles. Will continue to monitor.

## 2020-06-02 NOTE — Progress Notes (Addendum)
PROGRESS NOTE    Nicole [name]  FFM:[id number] DOB: 1951-09-27 DOA: 05/30/2020 PCP: Elizabeth Palau, [name]   Chief Complaint  Patient presents with  . Diarrhea    Brief Narrative:   69 year old female with history of seizure disorder, stroke with left hemiparesis, hypertension, hyperlipidemia presents with persistent diarrhea and generalized weakness.  Patient reports she had a prolonged hospital stay at [facility] long for small bowel obstruction and was discharged on the 24th June.  And since then she has been having diarrhea.  C. difficile PCR is negative and GI pathogen is pending.  CT of the abdomen and pelvis shows Colonic wall thickening with pericolonic edema and fat stranding involving the distal descending through sigmoid colon consistent with colitis, likely infectious or inflammatory.  Assessment & Plan:   Active Problems:   Hyperlipidemia   Hypertension   Seizures (HCC)   Dehydration   Diarrhea   Hypomagnesemia   Cellulitis   Sepsis (HCC)   Colitis   Colitis infectious versus inflammatory Improving diarrhea, no nausea or vomiting or abdominal pain today.  GI consulted, recommended flex sigmoidoscopy/colonoscopy tomorrow if diarrhea does not improve.. Empirically on IV Zosyn. GI pathogen is pending. Patient is currently on soft diet and is able to tolerate.   Essential hypertension Blood pressure parameters are well controlled.    Seizure No seizures noted this admission. Continue with Tegretol 300 mg twice daily and Keppra 500 mg twice daily    Hyperlipidemia Continue with the Lipitor 10 mg 3 times a week.    L3-L4 endplate sclerosis with endplate irregularity and erosions. Will need further work-up with an MRI of the lumbar spine.  As an outpatient.   DVT prophylaxis: heparin.  Code Status: DNR Family Communication: none at bedside.  Disposition:   Status is: Inpatient  Remains inpatient appropriate because:IV treatments appropriate due to  intensity of illness or inability to take PO   Dispo:  Patient From: Home  Planned Disposition: To be determined  Expected discharge date: 06/04/20  Medically stable for discharge: No        Consultants:   Gastroenterology.   Procedures: none.   Antimicrobials: zosyn for colitis.    Subjective: Diarrhea improving, mildly nauseated.   Objective: Vitals:   06/02/20 1300 06/02/20 1400 06/02/20 1500 06/02/20 1600  BP: 121/71 126/70 132/70 126/76  Pulse: 80 88 89 88  Resp: 19 16 15 19   Temp:      TempSrc:      SpO2: 94% 98% 97% 96%  Weight:      Height:        Intake/Output Summary (Last 24 hours) at 06/02/2020 1650 Last data filed at 06/02/2020 1000 Gross per 24 hour  Intake 379.67 ml  Output 850 ml  Net -470.33 ml   Filed Weights   05/31/20 1145 06/01/20 0500 06/02/20 0316  Weight: 87.2 kg 90.5 kg 88.1 kg    Examination:  General exam: Appears calm and comfortable  Respiratory system: Clear to auscultation. Respiratory effort normal. Cardiovascular system: S1 & S2 heard, RRR. No JVD, . No pedal edema. Gastrointestinal system: Abdomen is soft, non tender, mildly distended, bowel sounds wnl.  Central nervous system: Alert and oriented. No focal neurological deficits. Extremities: Symmetric 5 x 5 power. Skin: No rashes, lesions or ulcers Psychiatry: Mood & affect appropriate.     Data Reviewed: I have personally reviewed following labs and imaging studies  CBC: Recent Labs  Lab 05/30/20 2307 05/31/20 0338 05/31/20 0730  WBC 6.2 6.7 8.6  NEUTROABS  5.1 6.0  --   HGB 9.7* 7.9* 8.8*  HCT 31.0* 25.4* 27.8*  MCV 101.3* 102.4* 101.8*  PLT 397 308 369    Basic Metabolic Panel: Recent Labs  Lab 05/27/20 0424 05/30/20 2307 05/31/20 0338 05/31/20 0730  NA  --  136 138  --   K  --  3.4* 3.4*  --   CL  --  96* 103  --   CO2  --  30 27  --   GLUCOSE  --  113* 106*  --   BUN  --  14 14  --   CREATININE  --  0.73 0.66 0.74  CALCIUM  --  8.8* 8.2*   --   MG 1.8  --  1.7  --   PHOS  --   --  3.2  --     GFR: Estimated Creatinine Clearance: 74.6 mL/min (by C-G formula based on SCr of 0.74 mg/dL).  Liver Function Tests: Recent Labs  Lab 05/30/20 2307 05/31/20 0338  AST 37 29  ALT 27 21  ALKPHOS 47 34*  BILITOT 0.3 0.5  PROT 5.3* 4.3*  ALBUMIN 1.6* 1.5*    CBG: Recent Labs  Lab 05/31/20 1146  GLUCAP 144*     Recent Results (from the past 240 hour(s))  Culture, blood (routine x 2)     Status: None (Preliminary result)   Collection Time: 05/30/20 11:02 PM   Specimen: BLOOD  Result Value Ref Range Status   Specimen Description BLOOD RIGHT ANTECUBITAL  Final   Special Requests   Final    BOTTLES DRAWN AEROBIC AND ANAEROBIC Blood Culture results may not be optimal due to an excessive volume of blood received in culture bottles   Culture   Final    NO GROWTH 3 DAYS Performed at [facility], [address] [name], [place] [address]    Report Status PENDING  Incomplete  Urine culture     Status: None   Collection Time: 05/30/20 11:08 PM   Specimen: Urine, Random  Result Value Ref Range Status   Specimen Description URINE, RANDOM  Final   Special Requests NONE  Final   Culture   Final    NO GROWTH Performed at [facility], [address], [place], [place] [address]    Report Status 06/01/2020 FINAL  Final  Culture, blood (routine x 2)     Status: None (Preliminary result)   Collection Time: 05/30/20 11:47 PM   Specimen: BLOOD  Result Value Ref Range Status   Specimen Description BLOOD SITE NOT SPECIFIED  Final   Special Requests   Final    BOTTLES DRAWN AEROBIC ONLY Blood Culture results may not be optimal due to an inadequate volume of blood received in culture bottles   Culture   Final    NO GROWTH 2 DAYS Performed at [facility], [address] [place], [place] [address]    Report Status PENDING  Incomplete  SARS Coronavirus 2 by RT PCR (hospital order, performed in [facility]  hospital lab) Nasopharyngeal Nasopharyngeal Swab     Status: None   Collection Time: 05/31/20  1:32 AM   Specimen: Nasopharyngeal Swab  Result Value Ref Range Status   SARS Coronavirus 2 NEGATIVE NEGATIVE Final    Comment: (NOTE) SARS-CoV-2 target nucleic acids are NOT DETECTED.  The SARS-CoV-2 RNA is generally detectable in upper and lower respiratory specimens during the acute phase of infection. The lowest concentration of SARS-CoV-2 viral  copies this assay can detect is 250 copies / mL. A negative result does not preclude SARS-CoV-2 infection and should not be used as the sole basis for treatment or other patient management decisions.  A negative result may occur with improper specimen collection / handling, submission of specimen other than nasopharyngeal swab, presence of viral mutation(s) within the areas targeted by this assay, and inadequate number of viral copies (<250 copies / mL). A negative result must be combined with clinical observations, patient history, and epidemiological information.  Fact Sheet for Patients:   BoilerBrush.com.cy  Fact Sheet for Healthcare Providers: https://pope.com/  This test is not yet approved or  cleared by the [place] FDA and has been authorized for detection and/or diagnosis of SARS-CoV-2 by FDA under an Emergency Use Authorization (EUA).  This EUA will remain in effect (meaning this test can be used) for the duration of the COVID-19 declaration under Section 564(b)(1) of the Act, 21 U.S.C. section 360bbb-3(b)(1), unless the authorization is terminated or revoked sooner.  Performed at [facility], [address] [place], [place] [address]   MRSA PCR Screening     Status: Abnormal   Collection Time: 05/31/20  6:15 AM   Specimen: Nasal Mucosa; Nasopharyngeal  Result Value Ref Range Status   MRSA by PCR POSITIVE (A) NEGATIVE Final    Comment:        The GeneXpert MRSA  Assay (FDA approved for NASAL specimens only), is one component of a comprehensive MRSA colonization surveillance program. It is not intended to diagnose MRSA infection nor to guide or monitor treatment for MRSA infections. RESULT CALLED TO, READ BACK BY AND VERIFIED WITH: Truitt Leep RN 9:00 05/31/20 ([email or web address]) Performed at [facility], [address] [place], [place] [address]   MRSA PCR Screening     Status: Abnormal   Collection Time: 05/31/20 11:46 AM   Specimen: Nasopharyngeal  Result Value Ref Range Status   MRSA by PCR POSITIVE (A) NEGATIVE Final    Comment:        The GeneXpert MRSA Assay (FDA approved for NASAL specimens only), is one component of a comprehensive MRSA colonization surveillance program. It is not intended to diagnose MRSA infection nor to guide or monitor treatment for MRSA infections. CRITICAL VALUE NOTED.  VALUE IS CONSISTENT WITH PREVIOUSLY REPORTED AND CALLED VALUE. Performed at [facility], [address], [place], [place] [address]   Gastrointestinal Panel by PCR , Stool     Status: None   Collection Time: 06/01/20 11:45 AM   Specimen: Stool  Result Value Ref Range Status   Campylobacter species NOT DETECTED NOT DETECTED Final   Plesimonas shigelloides NOT DETECTED NOT DETECTED Final   Salmonella species NOT DETECTED NOT DETECTED Final   Yersinia enterocolitica NOT DETECTED NOT DETECTED Final   Vibrio species NOT DETECTED NOT DETECTED Final   Vibrio cholerae NOT DETECTED NOT DETECTED Final   Enteroaggregative E coli (EAEC) NOT DETECTED NOT DETECTED Final   Enteropathogenic E coli (EPEC) NOT DETECTED NOT DETECTED Final   Enterotoxigenic E coli (ETEC) NOT DETECTED NOT DETECTED Final   Shiga like toxin producing E coli (STEC) NOT DETECTED NOT DETECTED Final   Shigella/Enteroinvasive E coli (EIEC) NOT DETECTED NOT DETECTED Final   Cryptosporidium NOT DETECTED NOT DETECTED Final   Cyclospora cayetanensis NOT DETECTED NOT  DETECTED Final   Entamoeba histolytica NOT DETECTED NOT DETECTED Final   Giardia lamblia NOT DETECTED NOT DETECTED Final   Adenovirus F40/41  NOT DETECTED NOT DETECTED Final   Astrovirus NOT DETECTED NOT DETECTED Final   Norovirus GI/GII NOT DETECTED NOT DETECTED Final   Rotavirus A NOT DETECTED NOT DETECTED Final   Sapovirus (I, II, IV, and V) NOT DETECTED NOT DETECTED Final    Comment: Performed at [facility], [address] [place], [place] [address]  C Difficile Quick Screen w PCR reflex     Status: None   Collection Time: 06/01/20 11:45 AM   Specimen: STOOL  Result Value Ref Range Status   C Diff antigen NEGATIVE NEGATIVE Final   C Diff toxin NEGATIVE NEGATIVE Final   C Diff interpretation No C. difficile detected.  Final    Comment: Performed at [facility], [address] [place], [place] [address]         Radiology Studies: VAS [place] LOWER EXTREMITY VENOUS (DVT)  Result Date: 06/01/2020  Lower Venous DVTStudy Indications: Edema.  Limitations: Patient could not tolerate compressions. Patient was combative, threatening, non-compliant. Comparison Study: No prior studies. Performing Technologist: Jean Rosenthal  Examination Guidelines: A complete evaluation includes B-mode imaging, spectral Doppler, color Doppler, and power Doppler as needed of all accessible portions of each vessel. Bilateral testing is considered an integral part of a complete examination. Limited examinations for reoccurring indications may be performed as noted. The reflux portion of the exam is performed with the patient in reverse Trendelenburg.  +---------+---------------+---------+-----------+---------------+--------------+ RIGHT    CompressibilityPhasicitySpontaneityProperties     Thrombus Aging +---------+---------------+---------+-----------+---------------+--------------+ CFV      Full           Yes      Yes                                       +---------+---------------+---------+-----------+---------------+--------------+ SFJ      Full                                                             +---------+---------------+---------+-----------+---------------+--------------+ FV Prox                          Yes        Patent by color               +---------+---------------+---------+-----------+---------------+--------------+ FV Mid                           Yes        Patent by color               +---------+---------------+---------+-----------+---------------+--------------+ FV Distal                        Yes        Patent by color               +---------+---------------+---------+-----------+---------------+--------------+ PFV                              Yes        Patent by color               +---------+---------------+---------+-----------+---------------+--------------+  PTV                              Yes        Patent by color               +---------+---------------+---------+-----------+---------------+--------------+ PERO                             Yes        Patent by color               +---------+---------------+---------+-----------+---------------+--------------+   +---------+---------------+---------+-----------+---------------+--------------+ LEFT     CompressibilityPhasicitySpontaneityProperties     Thrombus Aging +---------+---------------+---------+-----------+---------------+--------------+ CFV                              Yes        Patent by color               +---------+---------------+---------+-----------+---------------+--------------+ SFJ                              Yes        Patent by color               +---------+---------------+---------+-----------+---------------+--------------+ FV Prox                          Yes        Patent by color               +---------+---------------+---------+-----------+---------------+--------------+ FV  Mid                           Yes        Patent by color               +---------+---------------+---------+-----------+---------------+--------------+ FV Distal                        Yes        Patent by color               +---------+---------------+---------+-----------+---------------+--------------+ PFV                                                        Not visualized +---------+---------------+---------+-----------+---------------+--------------+ POP                                                        Not visualized +---------+---------------+---------+-----------+---------------+--------------+ PTV                              Yes        Patent by color               +---------+---------------+---------+-----------+---------------+--------------+ PERO  Yes        Patent by color               +---------+---------------+---------+-----------+---------------+--------------+     Summary: RIGHT: - There is no evidence of deep vein thrombosis in the lower extremity. However, portions of this examination were limited- see technologist comments above.  LEFT: - There is no evidence of deep vein thrombosis in the lower extremity. However, portions of this examination were limited- see technologist comments above.  *See table(s) above for measurements and observations. Electronically signed by Fabienne Brunsharles Fields MD on 06/01/2020 at 11:31:17 AM.    Final         Scheduled Meds: . atorvastatin  10 mg Oral Q M,W,F  . budesonide  0.25 mg Inhalation BID  . calcium carbonate  1 tablet Oral TID WC  . carbamazepine  300 mg Oral BID  . Chlorhexidine Gluconate Cloth  6 each Topical Daily  . feeding supplement  1 Container Oral TID BM  . gabapentin  300 mg Oral TID  . heparin  5,000 Units Subcutaneous Q8H  . levETIRAcetam  500 mg Oral BID  . mouth rinse  15 mL Mouth Rinse BID  . [START ON 06/03/2020] multivitamin with minerals  1 tablet Oral  Daily  . mupirocin ointment   Nasal BID  . sodium chloride flush  3 mL Intravenous Q12H   Continuous Infusions: . sodium chloride Stopped (06/02/20 0556)  . sodium chloride Stopped (05/31/20 1259)  . piperacillin-tazobactam (ZOSYN)  IV 3.375 g (06/02/20 1047)     LOS: 2 days        Kathlen ModyVijaya Mattson Dayal, MD [facility]   To contact the attending provider between 7A-7P or the covering provider during after hours 7P-7A, please log into the web site [email or web address] and access using universal  password for that web site. If you do not have the password, please call the hospital operator.  06/02/2020, 4:50 PM

## 2020-06-02 NOTE — Evaluation (Signed)
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  She was recently admitted with a small bowel obstruction and diarrhea, discharged on 6/24.  She continued to have diarrhea after discharge, poor p.o. intake and was readmitted on 6/27 with shock, confusion.    Clinical Impression   Pt's sister present and providing PLOF information. PTA, pt was living at a group home, and was independent with ADL until about 2 months ago. Sister reports pt has had a decline in the last couple of months and has been requiring assistance with ADL and with +2 for transfer. Prior to this decline, pt was ambulating at RW level. Pt currently requires max encouragement for participation during session, pt's sister present and pt agreeable to sister. Pt currently requires totalA+2 for bed mobility and minguard for grooming. Pt tolerated sitting EOB about moderate-maximum physical assistance. Pt with 3x emesis sitting EOB, RN aware. Due to decline in current level of function, pt would benefit from acute OT to address established goals to facilitate safe D/C to venue listed below. At this time, recommend SNF follow-up. Will continue to follow acutely.     Follow Up Recommendations  SNF    Equipment Recommendations  Other (comment) (defer to next venue)    Recommendations for Other Services       Precautions / Restrictions Precautions Precautions: Fall Restrictions Weight Bearing Restrictions: No      Mobility Bed Mobility Overal bed mobility: Needs Assistance Bed Mobility: Supine to Sit;Sit to Supine     Supine to sit: Total assist;+2 for physical assistance Sit to supine: Max assist;+2 for physical assistance;Total assist   General bed mobility comments: pivoted on the  bed pad to get LE's off the bed.  pt did not attempt to assist up and forward to EOB..  She did want to lie down and helped prop on R UE.  Transfers                 General transfer comment: deferred    Balance Overall balance assessment: Needs assistance Sitting-balance support: Feet supported;Bilateral upper extremity supported Sitting balance-Leahy Scale: Poor Sitting balance - Comments: generally mod assist, listing right and posteriorly,. Postural control: Right lateral lean;Posterior lean                                 ADL either performed or assessed with clinical judgement   ADL Overall ADL's : Needs assistance/impaired Eating/Feeding: Set up;Bed level   Grooming: Min guard;Sitting Grooming Details (indicate cue type and reason): cues for prompting pt to wash face Upper Body Bathing: Moderate assistance;Bed level   Lower Body Bathing: Maximal assistance;Bed level   Upper Body Dressing : Moderate assistance;Sitting   Lower Body Dressing: Total assistance;Sitting/lateral leans     Toilet Transfer Details (indicate cue type and reason): deferred           General ADL Comments: pt tolerated sitting EOB about 75min;pt n/v while sitting EOB;pt required prompting to wipe her mouth;pt with self-limiting behaviors when asked to do something she will quickly respond with "no";pt's sister present and excellent with motivating and encouraging pt to participate     Vision Baseline Vision/History: Wears glasses Wears Glasses: At all times Patient Visual Report: 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)       Perception     Praxis      Pertinent Vitals/Pain Pain Assessment: Faces Faces Pain Scale: Hurts a little bit Pain Location: bottom Pain Descriptors / Indicators: Grimacing;Discomfort Pain Intervention(s): Monitored during session;Limited activity within patient's tolerance     Hand Dominance Right   Extremity/Trunk Assessment Upper  Extremity Assessment Upper Extremity Assessment: Generalized weakness;LUE deficits/detail LUE Deficits / Details: hx of L sided weakness, shoulder ROM <90*;MMT grossly 2/5;sister present reports this is baseline LUE Coordination: decreased fine motor;decreased gross motor   Lower Extremity Assessment Lower Extremity Assessment: Generalized weakness;Defer to PT evaluation RLE Deficits / Details: gross flextion 2/5, gross extension 3 LLE Deficits / Details: gross flexion 2/5, gross extension 3+/5   Cervical / Trunk Assessment Cervical / Trunk Assessment: Kyphotic   Communication Communication Communication: No difficulties   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 Prior to that, pt was ambulatory with a walker and assistance. Has a brace to use for knee hyperextension ADL's / Homemaking Assistance Needed: Needs assistance for ADLs - toileting, dressing and at times feeding.   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        OT Problem List: Decreased strength;Decreased activity tolerance;Impaired balance (sitting and/or standing);Decreased range of motion;Impaired vision/perception;Decreased  cognition;Decreased safety awareness;Decreased knowledge of use of DME or AE;Decreased knowledge of precautions;Pain      OT Treatment/Interventions: Self-care/ADL training;Therapeutic exercise;Neuromuscular education;DME and/or AE instruction;Therapeutic activities;Balance training;Patient/family education    OT Goals(Current goals can be found in the care plan section) Acute Rehab OT Goals Patient Stated Goal: To improve strength and functional abilities OT Goal Formulation: With patient/family Time For Goal Achievement: 06/16/20 Potential to Achieve Goals: Fair ADL Goals Pt Will Perform Eating: with set-up;sitting Pt Will Perform Grooming: with set-up;sitting Pt Will Perform Lower Body Dressing: sit to/from stand;with mod assist Pt Will Transfer to Toilet: with mod assist;stand pivot transfer;with +2 assist  OT Frequency: Min 2X/week   Barriers to D/C: Decreased caregiver support  patient living in group home with a decline in function       Co-evaluation PT/OT/SLP Co-Evaluation/Treatment: Yes Reason for Co-Treatment: For patient/therapist safety;To address functional/ADL transfers;Complexity of the patient's impairments (multi-system involvement)   OT goals addressed during session: ADL's and self-care      AM-PAC OT "6 Clicks" Daily Activity     Outcome Measure Help from another person eating meals?: A Lot Help from another person taking care of  personal grooming?: A Little Help from another person toileting, which includes using toliet, bedpan, or urinal?: Total Help from another person bathing (including washing, rinsing, drying)?: A Lot Help from another person to put on and taking off regular upper body clothing?: A Lot Help from another person to put on and taking off regular lower body clothing?: Total 6 Click Score: 11   End of Session Nurse Communication: Mobility status;Other (comment) (n/v)  Activity Tolerance: Treatment limited secondary to medical  complications (Comment) (n/v) Patient left: in bed;with call bell/phone within reach;with bed alarm set;with family/visitor present  OT Visit Diagnosis: Muscle weakness (generalized) (M62.81);Other symptoms and signs involving the nervous system (R29.898);Other symptoms and signs involving cognitive function;Pain;Hemiplegia and hemiparesis Hemiplegia - Right/Left: Left Hemiplegia - dominant/non-dominant: Non-Dominant Hemiplegia - caused by: Unspecified Pain - Right/Left: Right Pain - part of body: Leg                Time: 2197-5883 OT Time Calculation (min): 34 min Charges:  OT General Charges $OT Visit: 1 Visit OT Evaluation $OT Eval Moderate Complexity: 1 Mod  Virjean Boman OTR/L Acute Rehabilitation Services Office: [phone]   Rebeca Alert 06/02/2020, 2:49 PM

## 2020-06-02 NOTE — Progress Notes (Signed)
[facility] Gastroenterology Progress Note  Nicole [name] 69 y.o. 12/27/1950  CC:  diarrhea  Subjective: Patient denies any abdominal pain, nausea, vomiting.  She has been tolerating clear liquid diet and is interested in advancing her diet to soft solids.  ROS : Review of Systems  Cardiovascular: Negative for chest pain and palpitations.  Gastrointestinal: Positive for diarrhea. Negative for abdominal pain, blood in stool, constipation, heartburn, melena, nausea and vomiting.   Objective: Vital signs in last 24 hours: Vitals:   06/02/20 1100 06/02/20 1123  BP: 123/70   Pulse: 79   Resp: 18   Temp:  98 F (36.7 C)  SpO2: 96%     Physical Exam:  General:  Alert, cooperative, no acute distress, elderly; sister at bedside  Head:  Normocephalic, without obvious abnormality, atraumatic  Eyes:  Anicteric sclera, EOMs intact   Lungs:   Clear to auscultation bilaterally, respirations unlabored  Heart:  Regular rate and rhythm, S1, S2 normal  Abdomen:   Somewhat firm with mild distention but non-tender, bowel sounds active all four quadrants, no guarding or peritoneal signs; rectal tube in place with ~300cc brown output, no blood or melena noted  Extremities: Extremities without edema or tenderness  Pulses: 2+ and symmetric    Lab Results: Recent Labs    05/30/20 2307 05/30/20 2307 05/31/20 0338 05/31/20 0730  NA 136  --  138  --   K 3.4*  --  3.4*  --   CL 96*  --  103  --   CO2 30  --  27  --   GLUCOSE 113*  --  106*  --   BUN 14  --  14  --   CREATININE 0.73   < > 0.66 0.74  CALCIUM 8.8*  --  8.2*  --   MG  --   --  1.7  --   PHOS  --   --  3.2  --    < > = values in this interval not displayed.   Recent Labs    05/30/20 2307 05/31/20 0338  AST 37 29  ALT 27 21  ALKPHOS 47 34*  BILITOT 0.3 0.5  PROT 5.3* 4.3*  ALBUMIN 1.6* 1.5*   Recent Labs    05/30/20 2307 05/30/20 2307 05/31/20 0338 05/31/20 0730  WBC 6.2   < > 6.7 8.6  NEUTROABS 5.1  --  6.0  --    HGB 9.7*   < > 7.9* 8.8*  HCT 31.0*   < > 25.4* 27.8*  MCV 101.3*   < > 102.4* 101.8*  PLT 397   < > 308 369   < > = values in this interval not displayed.   No results for input(s): LABPROT, INR in the last 72 hours.  Assessment: Diarrhea x 3 weeks. Ischemia vs. Infectious. -C diff negative, GI pathogen panel from this hospitalization are pending (negative 05/17/20 during prior admission) -CT 6/28: Colonic wall thickening with pericolonic edema and fat stranding involving the distal descending through sigmoid colon consistent with colitis, likely infectious or inflammatory. -No leukocytosis as of 6/28: WBCs 8.6 -TSH normal 6/28  Hypokalemia: K+ 3.4 as of 6/28  Macrocytic anemia: Hgb 8.8 as of 6/28, no signs of GI bleeding  Plan: Diarrhea seems to be improving.  Await GI pathogen panel.  If negative and diarrhea persists or worsens, we will consider flexible sigmoidoscopy vs. colonoscopy.   Edrick Kins PA-C 06/02/2020, 1:27 PM  Contact #  [phone]

## 2020-06-02 NOTE — Progress Notes (Signed)
Nutrition Follow-up  RD working remotely.  DOCUMENTATION CODES:   Not applicable  INTERVENTION:   - Continue Boost Breeze po TID, each supplement provides 250 kcal and 9 grams of protein  - MVI with minerals daily  NUTRITION DIAGNOSIS:   Increased nutrient needs related to acute illness as evidenced by estimated needs.  Ongoing  GOAL:   Patient will meet greater than or equal to 90% of their needs  Progressing  MONITOR:   PO intake, Supplement acceptance, Diet advancement, Labs, Weight trends, Skin, I & O's  REASON FOR ASSESSMENT:   Consult Malnutrition Eval  ASSESSMENT:   69 year old female who presented from Group Home on 6/27 with diarrhea. 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 Recent admission 6/11-6/24 for diarrhea and hypotension and was found to have SBO vs ileus which resolved with NG tube. Admitted for severe sepsis secondary to acute colitis with RLE cellulitis.  6/30 - diet advanced to GI soft  Per results review, GI pathogen panel andC diff all negative. Per GI notes, pt still having diarrhea of unclear etiology but it is improving. If diarrhea worsens/persists, GI to consider flexible sigmoidoscopy.  Pt accepting ~75% of Boost Breeze supplements per [facility] documentation. Per pt's sister on 6/28, pt does not tolerate milk-like supplements. Will continue with Boost Breeze given good acceptance.  Noted pt refusing lab draws.  Weight stable. Per RN edema assessment this AM, pt with non-pitting edema to RUE and BLE and mild pitting edema to LUE.  Meal Completion: 40-100% (clear liquid diet)  Medications reviewed and include: calcium carbonate, Boost Breeze TID, IV abx  Labs reviewed: hemoglobin 8.8  UOP: 775 ml x 24 hours Stool: 75 ml x 24 hours I/O's: +5.3 L since admit  Diet Order:   Diet Order            DIET SOFT Room service appropriate? Yes; Fluid consistency: Thin   Diet effective now                 EDUCATION NEEDS:   No education needs have been identified at this time  Skin:  Skin Assessment: Reviewed RN Assessment (MASD to perineum and buttocks)  Last BM:  06/02/20 type 7 via rectal tube  Height:   Ht Readings from Last 1 Encounters:  05/31/20 5\' 7"  (1.702 m)    Weight:   Wt Readings from Last 1 Encounters:  06/02/20 88.1 kg    Ideal Body Weight:  61.4 kg  BMI:  Body mass index is 30.42 kg/m.  Estimated Nutritional Needs:   Kcal:  1700-1900  Protein:  85-100 grams  Fluid:  1.7-1.9 L    06/04/20, MS, RD, LDN Inpatient Clinical Dietitian Pager: [phone] Weekend/After Hours: [phone]

## 2020-06-02 NOTE — Evaluation (Signed)
Physical Therapy Evaluation Patient Details Name: Nicole [name] MRN: [id number] DOB: [birth year] Today's Date: 06/02/2020   History of Present Illness  69 year old woman with history of seizures, prior CVA, hypertension, hyperlipidemia.  She was recently admitted with a small bowel obstruction and diarrhea, discharged on 6/24.  She continued to have diarrhea after discharge, poor p.o. intake and was readmitted on 6/27 with shock, confusion.   Clinical Impression  Pt admitted with/for shock and confusion.  Pt presently needs lots of encouragement to participate and needs max to total assist for basic mobility. .  Pt currently limited functionally due to the problems listed. ( See problems list.)   Pt will benefit from PT to maximize function and safety in order to get ready for next venue listed below.     Follow Up Recommendations SNF    Equipment Recommendations  None recommended by PT    Recommendations for Other Services       Precautions / Restrictions Precautions Precautions: Fall      Mobility  Bed Mobility Overal bed mobility: Needs Assistance Bed Mobility: Supine to Sit;Sit to Supine     Supine to sit: Total assist;+2 for physical assistance Sit to supine: Max assist;+2 for physical assistance;Total assist   General bed mobility comments: pivoted on the bed pad to get LE's off the bed.  pt did not attempt to assist up and forward to EOB..  She did want to lie down and helped prop on R UE.  Transfers                 General transfer comment: NT  Ambulation/Gait                Stairs            Wheelchair Mobility    Modified Rankin (Stroke Patients Only)       Balance Overall balance assessment: Needs assistance Sitting-balance support: Feet supported;Bilateral upper extremity supported Sitting balance-Leahy Scale: Poor Sitting balance - Comments: generally mod assist, listing right and posteriorly,.                                      Pertinent Vitals/Pain Pain Assessment: Faces Faces Pain Scale: Hurts a little bit Pain Location: bottom Pain Descriptors / Indicators: Grimacing;Discomfort Pain Intervention(s): Monitored during session    Home Living Family/patient expects to be discharged to:: Group home                      Prior Function Level of Independence: Needs assistance   Gait / Transfers Assistance Needed: WC bound for last 2 months requring +2 assist for transfers (toilet/WC). Prior to that, pt was ambulatory with a walker and assistance. Has a brace to use for knee hyperextension  ADL's / Homemaking Assistance Needed: Needs assistance for ADLs - toileting, dressing and at times feeding.  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     Hand Dominance   Dominant Hand: Right    Extremity/Trunk Assessment   Upper Extremity Assessment Upper Extremity Assessment: Defer to OT evaluation    Lower Extremity Assessment Lower Extremity Assessment: Generalized weakness;Difficult to assess due to impaired cognition RLE Deficits / Details: gross flextion 2/5, gross extension 3 LLE Deficits / Details: gross flexion 2/5, gross extension 3+/5       Communication   Communication:  No difficulties  Cognition Arousal/Alertness: Awake/alert Behavior During Therapy: WFL for tasks assessed/performed Overall Cognitive Status: History of cognitive impairments - at baseline                                        General Comments General comments (skin integrity, edema, etc.): sat EOB for 6-8 min, sister present and encouraging.    Exercises Other Exercises Other Exercises: warm up ROM exercise to BIL LE's.   Assessment/Plan    PT Assessment Patient needs continued PT services  PT Problem List Decreased strength;Decreased activity tolerance;Decreased balance;Decreased mobility;Decreased cognition       PT  Treatment Interventions      PT Goals (Current goals can be found in the Care Plan section)  Acute Rehab PT Goals Patient Stated Goal: To improve strength and functional abilities PT Goal Formulation: With patient/family Time For Goal Achievement: 06/16/20 Potential to Achieve Goals: Fair    Frequency Min 2X/week   Barriers to discharge        Co-evaluation               AM-PAC PT "6 Clicks" Mobility  Outcome Measure Help needed turning from your back to your side while in a flat bed without using bedrails?: Total Help needed moving from lying on your back to sitting on the side of a flat bed without using bedrails?: Total Help needed moving to and from a bed to a chair (including a wheelchair)?: Total Help needed standing up from a chair using your arms (e.g., wheelchair or bedside chair)?: Total Help needed to walk in hospital room?: Total Help needed climbing 3-5 steps with a railing? : Total 6 Click Score: 6    End of Session   Activity Tolerance: Patient tolerated treatment well Patient left: in bed;with call bell/phone within reach;with bed alarm set;with family/visitor present Nurse Communication: Mobility status PT Visit Diagnosis: Other abnormalities of gait and mobility (R26.89);Muscle weakness (generalized) (M62.81)    Time: 9767-3419 PT Time Calculation (min) (ACUTE ONLY): 32 min   Charges:   PT Evaluation $PT Eval Moderate Complexity: 1 Mod          06/02/2020  Jacinto Halim., PT Acute Rehabilitation Services [phone]  (pager) [phone]  (office)  Eliseo Gum Ghislaine Harcum 06/02/2020, 2:27 PM

## 2020-06-03 LAB — BASIC METABOLIC PANEL
Anion gap: 9 (ref 5–15)
BUN: 6 mg/dL — ABNORMAL LOW (ref 8–23)
CO2: 27 mmol/L (ref 22–32)
Calcium: 8.8 mg/dL — ABNORMAL LOW (ref 8.9–10.3)
Chloride: 101 mmol/L (ref 98–111)
Creatinine, Ser: 0.56 mg/dL (ref 0.44–1.00)
GFR calc Af Amer: >60 mL/min (ref >60)
GFR calc non Af Amer: >60 mL/min (ref >60)
Glucose, Bld: 209 mg/dL — ABNORMAL HIGH (ref 70–99)
Potassium: 3.2 mmol/L — ABNORMAL LOW (ref 3.5–5.1)
Sodium: 137 mmol/L (ref 135–145)

## 2020-06-03 MED ORDER — POTASSIUM CHLORIDE CRYS ER 20 MEQ PO TBCR
40.0000 meq | EXTENDED_RELEASE_TABLET | Freq: Every day | ORAL | Status: DC
  Administered 2020-06-03 – 2020-06-10 (×8): 40 meq via ORAL
  Filled 2020-06-03 (×8): qty 2

## 2020-06-03 MED ORDER — FLEET ENEMA 7-19 GM/118ML RE ENEM
1.0000 | ENEMA | Freq: Once | RECTAL | Status: DC
  Filled 2020-06-03: qty 1

## 2020-06-03 MED ORDER — FLEET ENEMA 7-19 GM/118ML RE ENEM
1.0000 | ENEMA | Freq: Once | RECTAL | Status: AC
  Administered 2020-06-04: 1 via RECTAL
  Filled 2020-06-03: qty 1

## 2020-06-03 NOTE — Progress Notes (Addendum)
PROGRESS NOTE    Nicole [name]  RUE:[id number] DOB: 01/08/1951 DOA: 05/30/2020 PCP: Elizabeth Palau, [name]   Chief Complaint  Patient presents with  . Diarrhea    Brief Narrative:   69 year old female with history of seizure disorder, stroke with left hemiparesis, hypertension, hyperlipidemia presents with persistent diarrhea and generalized weakness.  Patient reports she had a prolonged hospital stay at [facility] long for small bowel obstruction and was discharged on the 24th June.  And since then she has been having diarrhea.  C. difficile PCR is negative and GI pathogen is negative.  CT of the abdomen and pelvis shows Colonic wall thickening with pericolonic edema and fat stranding involving the distal descending through sigmoid colon consistent with colitis, likely infectious or inflammatory. Pt seen and examined , her diarrhea is improving still watery. GI pathogen pcr is negative.    Assessment & Plan:   Active Problems:   Hyperlipidemia   Hypertension   Seizures (HCC)   Dehydration   Diarrhea   Hypomagnesemia   Cellulitis   Sepsis (HCC)   Colitis   Colitis infectious versus inflammatory Improving diarrhea, no nausea or vomiting or abdominal pain today.  GI consulted, recommended flex sigmoidoscopy/colonoscopy tomorrow if diarrhea does not improve.. c diff pcr is negative, Gi pathogen pcr is negative.  Patient is currently on soft diet and is able to tolerate. GI recommends getting a flex sigmoidoscopy tomorrow.  Npo after midnight.    Essential hypertension Blood pressure parameters are optimal.     Seizure Continue with Tegretol 300 mg twice daily and Keppra 500 mg twice daily No seizures this admission.     Hyperlipidemia Continue with the Lipitor 10 mg 3 times a week.    L3-L4 endplate sclerosis with endplate irregularity and erosions. Will need further work-up with an MRI of the lumbar spine.  As an outpatient.     Anemia of chronic disease/anemia  of acute illness Baseline hemoglobin appears to be around 12. Continue to drop from 12 to 10.1, 9.6, 8.8.  Anemia panel will be ordered.  Flex sigmoidoscopy ordered.    DVT prophylaxis: heparin.  Code Status: DNR Family Communication: family at bedside.  Disposition:   Status is: Inpatient  Remains inpatient appropriate because:Ongoing diagnostic testing needed not appropriate for outpatient work up and IV treatments appropriate due to intensity of illness or inability to take PO   Dispo:  Patient From: Home  Planned Disposition: SNF.   Expected discharge date: 06/04/20  Medically stable for discharge: No        Consultants:   Gastroenterology.   Procedures: none.  Antimicrobials:  Anti-infectives (From admission, onward)   Start     Dose/Rate Route Frequency Ordered Stop   06/01/20 0000  vancomycin (VANCOCIN) IVPB 1000 mg/200 mL premix  Status:  Discontinued        1,000 mg 200 mL/hr over 60 Minutes Intravenous Every 12 hours 05/31/20 1107 06/01/20 1027   05/31/20 1100  vancomycin (VANCOREADY) IVPB 1750 mg/350 mL        1,750 mg 175 mL/hr over 120 Minutes Intravenous  Once 05/31/20 1058 05/31/20 1501   05/31/20 0800  piperacillin-tazobactam (ZOSYN) IVPB 3.375 g  Status:  Discontinued        3.375 g 12.5 mL/hr over 240 Minutes Intravenous Every 8 hours 05/31/20 0217 06/03/20 0917   05/31/20 0230  piperacillin-tazobactam (ZOSYN) IVPB 3.375 g        3.375 g 100 mL/hr over 30 Minutes Intravenous  Once 05/31/20 0221 05/31/20  5277   05/31/20 0130  ceFAZolin (ANCEF) IVPB 1 g/50 mL premix  Status:  Discontinued        1 g 100 mL/hr over 30 Minutes Intravenous Every 8 hours 05/31/20 0121 05/31/20 0216   05/31/20 0000  ceFAZolin (ANCEF) IVPB 1 g/50 mL premix        1 g 100 mL/hr over 30 Minutes Intravenous  Once 05/30/20 2345 05/31/20 0056          Subjective:  Diarrhea is better, no abdominal pain, able to tolerate soft diet.   Objective: Vitals:   06/03/20  0906 06/03/20 1200 06/03/20 1300 06/03/20 1400  BP:  107/64 119/64 121/69  Pulse:  92 85 93  Resp:  19 16 20   Temp:      TempSrc:      SpO2: 95% 98% 97% 98%  Weight:      Height:        Intake/Output Summary (Last 24 hours) at 06/03/2020 1508 Last data filed at 06/03/2020 1400 Gross per 24 hour  Intake 431.96 ml  Output 1700 ml  Net -1268.04 ml   Filed Weights   06/01/20 0500 06/02/20 0316 06/03/20 0257  Weight: 90.5 kg 88.1 kg 90.5 kg    Examination:  General exam: Alert and comfortable, not in any kind of distress Respiratory system: Clear to auscultation bilaterally, no wheezing or rhonchi Cardiovascular system: S1-S2 heard, regular rate rhythm, no JVD Gastrointestinal system: Abdomen is soft, nontender, mildly distended, bowel sounds normal Central nervous system: Alert and oriented to person only, grossly nonfocal Extremities: No pedal edema, cyanosis Skin: No rashes seen Psychiatry: Mood is appropriate    Data Reviewed: I have personally reviewed following labs and imaging studies  CBC: Recent Labs  Lab 05/30/20 2307 05/31/20 0338 05/31/20 0730  WBC 6.2 6.7 8.6  NEUTROABS 5.1 6.0  --   HGB 9.7* 7.9* 8.8*  HCT 31.0* 25.4* 27.8*  MCV 101.3* 102.4* 101.8*  PLT 397 308 369    Basic Metabolic Panel: Recent Labs  Lab 05/30/20 2307 05/31/20 0338 05/31/20 0730 06/03/20 1210  NA 136 138  --  137  K 3.4* 3.4*  --  3.2*  CL 96* 103  --  101  CO2 30 27  --  27  GLUCOSE 113* 106*  --  209*  BUN 14 14  --  6*  CREATININE 0.73 0.66 0.74 0.56  CALCIUM 8.8* 8.2*  --  8.8*  MG  --  1.7  --   --   PHOS  --  3.2  --   --     GFR: Estimated Creatinine Clearance: 75.6 mL/min (by C-G formula based on SCr of 0.56 mg/dL).  Liver Function Tests: Recent Labs  Lab 05/30/20 2307 05/31/20 0338  AST 37 29  ALT 27 21  ALKPHOS 47 34*  BILITOT 0.3 0.5  PROT 5.3* 4.3*  ALBUMIN 1.6* 1.5*    CBG: Recent Labs  Lab 05/31/20 1146  GLUCAP 144*     Recent  Results (from the past 240 hour(s))  Culture, blood (routine x 2)     Status: None (Preliminary result)   Collection Time: 05/30/20 11:02 PM   Specimen: BLOOD  Result Value Ref Range Status   Specimen Description BLOOD RIGHT ANTECUBITAL  Final   Special Requests   Final    BOTTLES DRAWN AEROBIC AND ANAEROBIC Blood Culture results may not be optimal due to an excessive volume of blood received in culture bottles   Culture   Final  NO GROWTH 4 DAYS Performed at [facility], [address], [place], [place] [address]    Report Status PENDING  Incomplete  Urine culture     Status: None   Collection Time: 05/30/20 11:08 PM   Specimen: Urine, Random  Result Value Ref Range Status   Specimen Description URINE, RANDOM  Final   Special Requests NONE  Final   Culture   Final    NO GROWTH Performed at [facility], [address] [place], [place] [address]    Report Status 06/01/2020 FINAL  Final  Culture, blood (routine x 2)     Status: None (Preliminary result)   Collection Time: 05/30/20 11:47 PM   Specimen: BLOOD  Result Value Ref Range Status   Specimen Description BLOOD SITE NOT SPECIFIED  Final   Special Requests   Final    BOTTLES DRAWN AEROBIC ONLY Blood Culture results may not be optimal due to an inadequate volume of blood received in culture bottles   Culture   Final    NO GROWTH 3 DAYS Performed at [facility], [address] [place], [place] [address]    Report Status PENDING  Incomplete  SARS Coronavirus 2 by RT PCR (hospital order, performed in [facility] hospital lab) Nasopharyngeal Nasopharyngeal Swab     Status: None   Collection Time: 05/31/20  1:32 AM   Specimen: Nasopharyngeal Swab  Result Value Ref Range Status   SARS Coronavirus 2 NEGATIVE NEGATIVE Final    Comment: (NOTE) SARS-CoV-2 target nucleic acids are NOT DETECTED.  The SARS-CoV-2 RNA is generally detectable in upper and lower respiratory specimens during the acute phase of  infection. The lowest concentration of SARS-CoV-2 viral copies this assay can detect is 250 copies / mL. A negative result does not preclude SARS-CoV-2 infection and should not be used as the sole basis for treatment or other patient management decisions.  A negative result may occur with improper specimen collection / handling, submission of specimen other than nasopharyngeal swab, presence of viral mutation(s) within the areas targeted by this assay, and inadequate number of viral copies (<250 copies / mL). A negative result must be combined with clinical observations, patient history, and epidemiological information.  Fact Sheet for Patients:   BoilerBrush.com.cy  Fact Sheet for Healthcare Providers: https://pope.com/  This test is not yet approved or  cleared by the [place] FDA and has been authorized for detection and/or diagnosis of SARS-CoV-2 by FDA under an Emergency Use Authorization (EUA).  This EUA will remain in effect (meaning this test can be used) for the duration of the COVID-19 declaration under Section 564(b)(1) of the Act, 21 U.S.C. section 360bbb-3(b)(1), unless the authorization is terminated or revoked sooner.  Performed at [facility], [address] [place], [place] [address]   MRSA PCR Screening     Status: Abnormal   Collection Time: 05/31/20  6:15 AM   Specimen: Nasal Mucosa; Nasopharyngeal  Result Value Ref Range Status   MRSA by PCR POSITIVE (A) NEGATIVE Final    Comment:        The GeneXpert MRSA Assay (FDA approved for NASAL specimens only), is one component of a comprehensive MRSA colonization surveillance program. It is not intended to diagnose MRSA infection nor to guide or monitor treatment for MRSA infections. RESULT CALLED TO, READ BACK BY AND VERIFIED WITH: Truitt Leep RN 9:00 05/31/20 ([email or web address]) Performed at [facility], [address], [place], [place]  [address]   MRSA PCR Screening     Status: Abnormal   Collection Time: 05/31/20 11:46 AM   Specimen: Nasopharyngeal  Result Value Ref Range Status   MRSA by PCR POSITIVE (A) NEGATIVE Final    Comment:        The GeneXpert MRSA Assay (FDA approved for NASAL specimens only), is one component of a comprehensive MRSA colonization surveillance program. It is not intended to diagnose MRSA infection nor to guide or monitor treatment for MRSA infections. CRITICAL VALUE NOTED.  VALUE IS CONSISTENT WITH PREVIOUSLY REPORTED AND CALLED VALUE. Performed at [facility], [address] [place], [place] [address]   Gastrointestinal Panel by PCR , Stool     Status: None   Collection Time: 06/01/20 11:45 AM   Specimen: Stool  Result Value Ref Range Status   Campylobacter species NOT DETECTED NOT DETECTED Final   Plesimonas shigelloides NOT DETECTED NOT DETECTED Final   Salmonella species NOT DETECTED NOT DETECTED Final   Yersinia enterocolitica NOT DETECTED NOT DETECTED Final   Vibrio species NOT DETECTED NOT DETECTED Final   Vibrio cholerae NOT DETECTED NOT DETECTED Final   Enteroaggregative E coli (EAEC) NOT DETECTED NOT DETECTED Final   Enteropathogenic E coli (EPEC) NOT DETECTED NOT DETECTED Final   Enterotoxigenic E coli (ETEC) NOT DETECTED NOT DETECTED Final   Shiga like toxin producing E coli (STEC) NOT DETECTED NOT DETECTED Final   Shigella/Enteroinvasive E coli (EIEC) NOT DETECTED NOT DETECTED Final   Cryptosporidium NOT DETECTED NOT DETECTED Final   Cyclospora cayetanensis NOT DETECTED NOT DETECTED Final   Entamoeba histolytica NOT DETECTED NOT DETECTED Final   Giardia lamblia NOT DETECTED NOT DETECTED Final   Adenovirus F40/41 NOT DETECTED NOT DETECTED Final   Astrovirus NOT DETECTED NOT DETECTED Final   Norovirus GI/GII NOT DETECTED NOT DETECTED Final   Rotavirus A NOT DETECTED NOT DETECTED Final   Sapovirus (I, II, IV, and V) NOT DETECTED NOT DETECTED Final    Comment:  Performed at [facility], [address] [place], [place] [address]  C Difficile Quick Screen w PCR reflex     Status: None   Collection Time: 06/01/20 11:45 AM   Specimen: STOOL  Result Value Ref Range Status   C Diff antigen NEGATIVE NEGATIVE Final   C Diff toxin NEGATIVE NEGATIVE Final   C Diff interpretation No C. difficile detected.  Final    Comment: Performed at [facility], [address], [place], [place] [address]         Radiology Studies: No results found.      Scheduled Meds: . atorvastatin  10 mg Oral Q M,W,F  . budesonide  0.25 mg Inhalation BID  . calcium carbonate  1 tablet Oral TID WC  . carbamazepine  300 mg Oral BID  . Chlorhexidine Gluconate Cloth  6 each Topical Daily  . feeding supplement  1 Container Oral TID BM  . gabapentin  300 mg Oral TID  . heparin  5,000 Units Subcutaneous Q8H  . levETIRAcetam  500 mg Oral BID  . mouth rinse  15 mL Mouth Rinse BID  . multivitamin with minerals  1 tablet Oral Daily  . mupirocin ointment   Nasal BID  . potassium chloride  40 mEq Oral Once  . sodium chloride flush  3 mL Intravenous Q12H  . [START ON 06/04/2020] sodium phosphate  1 enema Rectal Once   Continuous Infusions: . sodium chloride 10 mL/hr at 06/03/20 0800  .  sodium chloride Stopped (05/31/20 1259)     LOS: 3 days        Kathlen ModyVijaya Timoth Schara, MD [facility]   To contact the attending provider between 7A-7P or the covering provider during after hours 7P-7A, please log into the web site [email or web address] and access using universal Bells password for that web site. If you do not have the password, please call the hospital operator.  06/03/2020, 3:08 PM

## 2020-06-03 NOTE — Progress Notes (Addendum)
[facility] Gastroenterology Progress Note  Nicole [name] 69 y.o. [birth year]  CC:  diarrhea  Subjective: Patient denies any abdominal pain, nausea, vomiting.  She tolerated a soft diet.  Per RN, patient continues to have diarrhea and has had 400 cc of output today thus far.  ROS : Review of Systems  Cardiovascular: Negative for chest pain and palpitations.  Gastrointestinal: Positive for diarrhea. Negative for abdominal pain, blood in stool, constipation, heartburn, melena, nausea and vomiting.   Objective: Vital signs in last 24 hours: Vitals:   06/03/20 0900 06/03/20 0906  BP: 113/66   Pulse: 92   Resp: 20   Temp:    SpO2: 95% 95%    Physical Exam:  General:  Alert, cooperative, no acute distress, elderly; sister at bedside  Head:  Normocephalic, without obvious abnormality, atraumatic  Eyes:  Anicteric sclera, EOMs intact   Lungs:   Clear to auscultation bilaterally, respirations unlabored  Heart:  Regular rate and rhythm, S1, S2 normal  Abdomen:   Somewhat firm with mild distention but non-tender, bowel sounds active all four quadrants, no guarding or peritoneal signs; rectal tube in place with ~400cc brown output, no blood or melena noted  Extremities: Extremities without edema or tenderness  Pulses: 2+ and symmetric    Lab Results: No results for input(s): NA, K, CL, CO2, GLUCOSE, BUN, CREATININE, CALCIUM, MG, PHOS in the last 72 hours. No results for input(s): AST, ALT, ALKPHOS, BILITOT, PROT, ALBUMIN in the last 72 hours. No results for input(s): WBC, NEUTROABS, HGB, HCT, MCV, PLT in the last 72 hours. No results for input(s): LABPROT, INR in the last 72 hours.  Assessment: Diarrhea x 3 weeks. 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   -C diff and GI pathogen panel negative from this hospitalization and prior admission -CT 6/28: Colonic wall thickening with pericolonic edema and fat stranding involving the distal descending through sigmoid colon consistent with colitis,  likely infectious or inflammatory. -No leukocytosis as of 6/28: WBCs 8.6 -TSH normal 6/28  Hypokalemia: K+ 3.4 as of 6/28  Macrocytic anemia: Hgb 8.8 as of 6/28, no signs of GI bleeding  Plan: Discussed options with patient and patient's sister to include colonoscopy with prep, flexible sigmoidoscopy, and observation/medical management.  Patient refuses to drink any prep for colonoscopy, thus we will proceed with flexible sigmoidoscopy tomorrow.  I thoroughly discussed the procedure with the patient and the patient's guardian/sister [name] to include nature, benefits, and risks (including but not limited to bleeding, infection, perforation, anesthesia/cardiac and pulmonary complications).  Patient's guardian [name] expressed understanding and gave verbal consent to proceed with flexible sigmoidoscopy with Dr. Dulce Sellar tomorrow.  Clear liquid diet today.  N.p.o. after midnight.  One fleets enema tomorrow morning.  [facility] will follow.  Edrick Kins PA-C 06/03/2020, 11:05 AM  Contact #  [phone]

## 2020-06-04 ENCOUNTER — Encounter (HOSPITAL_COMMUNITY): Payer: Self-pay | Admitting: Internal Medicine

## 2020-06-04 ENCOUNTER — Inpatient Hospital Stay (HOSPITAL_COMMUNITY): Payer: Medicare Other | Admitting: Certified Registered"

## 2020-06-04 ENCOUNTER — Encounter (HOSPITAL_COMMUNITY)
Admission: AD | Disposition: A | Discharge status: Home Health | Payer: Self-pay | Source: Home / Self Care | Attending: Internal Medicine

## 2020-06-04 HISTORY — PX: BIOPSY: SHX5522

## 2020-06-04 HISTORY — PX: FLEXIBLE SIGMOIDOSCOPY: SHX5431

## 2020-06-04 LAB — CBC WITH DIFFERENTIAL/PLATELET
Abs Immature Granulocytes: 0 10*3/uL (ref 0.00–0.07)
Basophils Absolute: 0 10*3/uL (ref 0.0–0.1)
Basophils Relative: 0 %
Eosinophils Absolute: 0 10*3/uL (ref 0.0–0.5)
Eosinophils Relative: 1 %
HCT: 31.9 % — ABNORMAL LOW (ref 36.0–46.0)
Hemoglobin: 10.1 g/dL — ABNORMAL LOW (ref 12.0–15.0)
Lymphocytes Relative: 14 %
Lymphs Abs: 0.7 10*3/uL (ref 0.7–4.0)
MCH: 31.6 pg (ref 26.0–34.0)
MCHC: 31.7 g/dL (ref 30.0–36.0)
MCV: 99.7 fL (ref 80.0–100.0)
Monocytes Absolute: 0.3 10*3/uL (ref 0.1–1.0)
Monocytes Relative: 6 %
Neutro Abs: 3.9 10*3/uL (ref 1.7–7.7)
Neutrophils Relative %: 79 %
Platelets: 391 10*3/uL (ref 150–400)
RBC: 3.2 MIL/uL — ABNORMAL LOW (ref 3.87–5.11)
RDW: 13.1 % (ref 11.5–15.5)
WBC: 4.9 10*3/uL (ref 4.0–10.5)
nRBC: 0 % (ref 0.0–0.2)
nRBC: 1 /100 WBC — ABNORMAL HIGH

## 2020-06-04 LAB — BASIC METABOLIC PANEL
Anion gap: 8 (ref 5–15)
BUN: 6 mg/dL — ABNORMAL LOW (ref 8–23)
CO2: 29 mmol/L (ref 22–32)
Calcium: 9.5 mg/dL (ref 8.9–10.3)
Chloride: 100 mmol/L (ref 98–111)
Creatinine, Ser: 0.47 mg/dL (ref 0.44–1.00)
GFR calc Af Amer: >60 mL/min (ref >60)
GFR calc non Af Amer: >60 mL/min (ref >60)
Glucose, Bld: 108 mg/dL — ABNORMAL HIGH (ref 70–99)
Potassium: 4 mmol/L (ref 3.5–5.1)
Sodium: 137 mmol/L (ref 135–145)

## 2020-06-04 LAB — IRON AND TIBC
Iron: 54 ug/dL (ref 28–170)
Saturation Ratios: 31 % (ref 10.4–31.8)
TIBC: 172 ug/dL — ABNORMAL LOW (ref 250–450)
UIBC: 118 ug/dL

## 2020-06-04 LAB — CULTURE, BLOOD (ROUTINE X 2): Culture: NO GROWTH

## 2020-06-04 LAB — FERRITIN: Ferritin: 249 ng/mL (ref 11–307)

## 2020-06-04 SURGERY — SIGMOIDOSCOPY, FLEXIBLE
Anesthesia: Monitor Anesthesia Care

## 2020-06-04 MED ORDER — LACTATED RINGERS IV SOLN
INTRAVENOUS | Status: AC | PRN
  Administered 2020-06-04: 10 mL/h via INTRAVENOUS

## 2020-06-04 MED ORDER — PROPOFOL 500 MG/50ML IV EMUL
INTRAVENOUS | Status: DC | PRN
  Administered 2020-06-04: 50 ug/kg/min via INTRAVENOUS

## 2020-06-04 MED ORDER — PHENYLEPHRINE HCL (PRESSORS) 10 MG/ML IV SOLN
INTRAVENOUS | Status: DC | PRN
  Administered 2020-06-04: 80 ug via INTRAVENOUS

## 2020-06-04 MED ORDER — POTASSIUM CHLORIDE CRYS ER 20 MEQ PO TBCR: 40.0000 meq | EXTENDED_RELEASE_TABLET | Freq: Once | ORAL | Status: DC

## 2020-06-04 MED ORDER — SODIUM CHLORIDE 0.9 % IV SOLN: INTRAVENOUS | Status: DC

## 2020-06-04 NOTE — Transfer of Care (Signed)
Immediate Anesthesia Transfer of Care Note  Patient: Nicole [name]  Procedure(s) Performed: FLEXIBLE SIGMOIDOSCOPY (N/A ) BIOPSY  Patient Location: Endoscopy Unit  Anesthesia Type:MAC  Level of Consciousness: sedated  Airway & Oxygen Therapy: Patient connected to nasal cannula oxygen  Post-op Assessment: Post -op Vital signs reviewed and stable  Post vital signs: stable  Last Vitals:  Vitals Value Taken Time  BP    Temp    Pulse    Resp    SpO2      Last Pain:  Vitals:   06/04/20 1340  TempSrc: Oral  PainSc: 0-No pain      Patients Stated Pain Goal: 0 (01/75/10 2585)  Complications: No complications documented.

## 2020-06-04 NOTE — Anesthesia Postprocedure Evaluation (Signed)
Anesthesia Post Note  Patient: JAANA BRODT  Procedure(s) Performed: FLEXIBLE SIGMOIDOSCOPY (N/A ) BIOPSY     Patient location during evaluation: Endoscopy Anesthesia Type: MAC Level of consciousness: awake and alert Pain management: pain level controlled Vital Signs Assessment: post-procedure vital signs reviewed and stable Respiratory status: spontaneous breathing, nonlabored ventilation and respiratory function stable Cardiovascular status: blood pressure returned to baseline and stable Postop Assessment: no apparent nausea or vomiting Anesthetic complications: no   No complications documented.  Last Vitals:  Vitals:   06/04/20 1300 06/04/20 1340  BP: 118/64 117/63  Pulse: 80 84  Resp: (!) 23 19  Temp:  36.9 C  SpO2: 98% 100%    Last Pain:  Vitals:   06/04/20 1340  TempSrc: Oral  PainSc: 0-No pain                 Lynda Rainwater

## 2020-06-04 NOTE — Care Management (Addendum)
See previous TOC note. Called Drew with [facility] awaiting call back.  Will need resumption of care orders and face to face , if patient discharges to group home with home health services.    Spoke to [employer] with [facility] . Patient was active with HHPT, [facility] can add HHOT, they do not have an aide to see the patient right now though.  HHPT will access to see if [facility] needed.    Ronny Flurry RN

## 2020-06-04 NOTE — Op Note (Signed)
[facility] Patient Name: Nicole CornfieldConnie Grenda Procedure Date : 06/04/2020 MRN: [id number] Attending MD: Willis ModenaWilliam Ajanay Farve , MD Date of Birth: 03/25/1951 CSN: [id number] Age: [age] Admit Type: Inpatient Procedure:                Flexible Sigmoidoscopy Indications:              Diarrhea, Abnormal CT of the GI tract Providers:                Willis ModenaWilliam Jaquavius Hudler, MD, [place] BailiffMaggie Chrismon, RN, Arlee Muslimhris                            [place]ler Tech., Technician, Lawson Radararlene Davis,                            Technician, Ginette OttoHolly Quinn, CRNA Referring MD:              Medicines:                Monitored Anesthesia Care Complications:            No immediate complications. Estimated Blood Loss:     Estimated blood loss was minimal. Procedure:                Pre-Anesthesia Assessment:                           - Prior to the procedure, a History and Physical                            was performed, and patient medications and                            allergies were reviewed. The patient's tolerance of                            previous anesthesia was also reviewed. The risks                            and benefits of the procedure and the sedation                            options and risks were discussed with the patient.                            All questions were answered, and informed consent                            was obtained. Prior Anticoagulants: The patient has                            taken no previous anticoagulant or antiplatelet                            agents. ASA Grade Assessment: III - A patient with  severe systemic disease. After reviewing the risks                            and benefits, the patient was deemed in                            satisfactory condition to undergo the procedure.                           After obtaining informed consent, the scope was                            passed under direct vision. The PCF-H190DL                             ([id number] Olympus pediatric colonscope was                            introduced through the anus and advanced to the the                            descending colon. The flexible sigmoidoscopy was                            accomplished without difficulty. The patient                            tolerated the procedure well. The quality of the                            bowel preparation was fair. Scope In: 2:28:28 PM Scope Out: 2:36:23 PM Total Procedure Duration: 0 hours 7 minutes 55 seconds  Findings:      Hemorrhoids were found on perianal exam.      Small rectal prolapse seen.      A patchy area of moderately altered vascular, congested, friable (with       spontaneous bleeding), nodular, plaque covered, ulcerated and       vascular-pattern-decreased mucosa was found in the sigmoid colon and in       the descending colon. The colitis was patchy, with normal regions of       mucosa between abnormal ones. Depth of ulcerations was moderate (not       superficial, not extremely deep). Biopsies were taken with a cold       forceps for histology. Impression:               - Preparation of the colon was fair.                           - Hemorrhoids found on perianal exam.                           - Altered vascular, congested, friable (with                            spontaneous bleeding), nodular, plaque covered,  ulcerated and vascular-pattern-decreased mucosa in                            the sigmoid colon and in the descending colon.                            Biopsied.                           - Overall pattern most consistent with ischemic                            colitis. Moderate Sedation:      None Recommendation:           - Return patient to hospital ward for ongoing care.                           - Mechanical soft diet today.                           - Continue present medications.                           - Await pathology results.                            - Do not use rectal tube.                           [facility] will follow. Procedure Code(s):        --- Professional ---                           [phone], Sigmoidoscopy, flexible; with biopsy, single                            or multiple Diagnosis Code(s):        --- Professional ---                           K64.9, Unspecified hemorrhoids                           K92.2, Gastrointestinal hemorrhage, unspecified                           K63.3, Ulcer of intestine                           K63.89, Other specified diseases of intestine                           R19.7, Diarrhea, unspecified                           R93.3, Abnormal findings on diagnostic imaging of  other parts of digestive tract CPT copyright 2019 [employer]. All rights reserved. The codes documented in this report are preliminary and upon coder review may  be revised to meet current compliance requirements. Willis Modena, MD 06/04/2020 2:50:17 PM This report has been signed electronically. Number of Addenda: 0

## 2020-06-04 NOTE — Anesthesia Preprocedure Evaluation (Signed)
Anesthesia Evaluation  Patient identified by MRN, date of birth, ID band Patient awake    Reviewed: Allergy & Precautions, NPO status , Patient's Chart, lab work & pertinent test results  Airway Mallampati: II  TM Distance: >3 FB Neck ROM: Full    Dental no notable dental hx.    Pulmonary neg pulmonary ROS,    Pulmonary exam normal breath sounds clear to auscultation       Cardiovascular hypertension, Pt. on medications negative cardio ROS Normal cardiovascular exam Rhythm:Regular Rate:Normal     Neuro/Psych Seizures -,  CVA, Residual Symptoms negative psych ROS   GI/Hepatic negative GI ROS, Neg liver ROS,   Endo/Other  negative endocrine ROS  Renal/GU Renal disease  negative genitourinary   Musculoskeletal negative musculoskeletal ROS (+)   Abdominal (+) + obese,   Peds negative pediatric ROS (+)  Hematology negative hematology ROS (+)   Anesthesia Other Findings   Reproductive/Obstetrics negative OB ROS                             Anesthesia Physical Anesthesia Plan  ASA: III  Anesthesia Plan: MAC   Post-op Pain Management:    Induction: Intravenous  PONV Risk Score and Plan: 2 and Ondansetron, Midazolam and Treatment may vary due to age or medical condition  Airway Management Planned: Simple Face Mask  Additional Equipment:   Intra-op Plan:   Post-operative Plan:   Informed Consent: I have reviewed the patients History and Physical, chart, labs and discussed the procedure including the risks, benefits and alternatives for the proposed anesthesia with the patient or authorized representative who has indicated his/her understanding and acceptance.     Dental advisory given  Plan Discussed with: CRNA  Anesthesia Plan Comments:         Anesthesia Quick Evaluation

## 2020-06-04 NOTE — Progress Notes (Signed)
PROGRESS NOTE    Nicole [name]  QIH:[id number]:[id number] DOB: 02/26/1951 DOA: 05/30/2020 PCP: Elizabeth PalauAnderson, [name], [name]   Chief Complaint  Patient presents with  . Diarrhea    Brief Narrative:   69 year old female with history of seizure disorder, stroke with left hemiparesis, hypertension, hyperlipidemia presents with persistent diarrhea and generalized weakness.  Patient reports she had a [facility] for [facility] bowel obstruction and was discharged on the 24th June.  And since then she has been having diarrhea.  C. difficile PCR is negative and GI pathogen is negative.  CT of the abdomen and pelvis shows Colonic wall thickening with pericolonic edema and fat stranding involving the distal descending through sigmoid colon consistent with colitis, likely infectious or inflammatory. Patient seen and examined at bedside, diarrhea is improving plan for flex sigmoidoscopy today.   Assessment & Plan:   Active Problems:   Hyperlipidemia   Hypertension   Seizures (HCC)   Dehydration   Diarrhea   Hypomagnesemia   Cellulitis   Sepsis (HCC)   Colitis   Colitis infectious versus inflammatory Improving diarrhea, no nausea or vomiting or abdominal pain today.  GI consulted, recommended flex sigmoidoscopy/colonoscopy today. Plan sigmoidoscopy shows Altered vascular, congested, friable (with spontaneous bleeding), nodular, plaque covered, ulcerated and vascular-pattern-decreased mucosa in the sigmoid colon and in the descending colon. Biopsied. Overall pattern most consistent with ischemic colitis.. Remove the rectal tube today. c diff pcr is negative, Gi pathogen pcr is negative.  Patient is currently on soft diet and is able to tolerate.    Essential hypertension Blood pressure parameters are well controlled    Seizure Continue with Tegretol 300 mg twice daily and Keppra 500 mg twice daily No seizures this admission    Hyperlipidemia Continue with the Lipitor 10  mg 3 times a week.    L3-L4 endplate sclerosis with endplate irregularity and erosions. Will need further work-up with an MRI of the lumbar spine as an outpatient     Anemia of chronic disease/anemia of acute illness Baseline hemoglobin appears to be around 12. Continue to drop from 12 to 10.1, 9.6, 8.8To 10.1 Anemia panel reviewed showed normal ferritin and iron levels. Flex sigmoidoscopy ordered.    Hypokalemia Replaced   DVT prophylaxis: heparin.  Code Status: DNR Family Communication: family at bedside.  Disposition:   Status is: Inpatient  Remains inpatient appropriate because:Ongoing diagnostic testing needed not appropriate for outpatient work up   Dispo:  Patient From: Home  Planned Disposition: Group home with home PT and OT.  Expected discharge date: 06/05/20  Medically stable for discharge: No        Consultants:   Gastroenterology.   Procedures: none.  Antimicrobials:  Anti-infectives (From admission, onward)   Start     Dose/Rate Route Frequency Ordered Stop   06/01/20 0000  vancomycin (VANCOCIN) IVPB 1000 mg/200 mL premix  Status:  Discontinued        1,000 mg 200 mL/hr over 60 Minutes Intravenous Every 12 hours 05/31/20 1107 06/01/20 1027   05/31/20 1100  vancomycin (VANCOREADY) IVPB 1750 mg/350 mL        1,750 mg 175 mL/hr over 120 Minutes Intravenous  Once 05/31/20 1058 05/31/20 1501   05/31/20 0800  piperacillin-tazobactam (ZOSYN) IVPB 3.375 g  Status:  Discontinued        3.375 g 12.5 mL/hr over 240 Minutes Intravenous Every 8 hours 05/31/20 0217 06/03/20 0917   05/31/20 0230  piperacillin-tazobactam (ZOSYN) IVPB 3.375 g  3.375 g 100 mL/hr over 30 Minutes Intravenous  Once 05/31/20 0221 05/31/20 0357   05/31/20 0130  ceFAZolin (ANCEF) IVPB 1 g/50 mL premix  Status:  Discontinued        1 g 100 mL/hr over 30 Minutes Intravenous Every 8 hours 05/31/20 0121 05/31/20 0216   05/31/20 0000  ceFAZolin (ANCEF) IVPB 1 g/50 mL premix         1 g 100 mL/hr over 30 Minutes Intravenous  Once 05/30/20 2345 05/31/20 0056         Subjective: Diarrhea is improving no nausea vomiting abdominal pain at this time  Objective: Vitals:   06/04/20 0700 06/04/20 0800 06/04/20 0900 06/04/20 1000  BP: 123/65 121/67 125/70 128/72  Pulse: 88 88 84 81  Resp: 20 (!) 27    Temp: 98.7 F (37.1 C)     TempSrc: Oral     SpO2: 98% 96% 97% 99%  Weight:      Height:        Intake/Output Summary (Last 24 hours) at 06/04/2020 1132 Last data filed at 06/04/2020 0800 Gross per 24 hour  Intake 770 ml  Output 6590 ml  Net -5820 ml   Filed Weights   06/02/20 0316 06/03/20 0257 06/04/20 0500  Weight: 88.1 kg 90.5 kg 94.1 kg    Examination:  General exam: Alert, oriented, not in distress Respiratory system: Clear to auscultation bilaterally, no wheezing or rhonchi Cardiovascular system: S1-S2 heard, regular rate rhythm, no JVD Gastrointestinal system: Abdomen is soft, nontender, slightly distended, bowel sounds normal Central nervous system: Not any oriented to person only Extremities: No cyanosis or clubbing Skin: No rashes seen Psychiatry: Mood is appropriate    Data Reviewed: I have personally reviewed following labs and imaging studies  CBC: Recent Labs  Lab 05/30/20 2307 05/31/20 0338 05/31/20 0730  WBC 6.2 6.7 8.6  NEUTROABS 5.1 6.0  --   HGB 9.7* 7.9* 8.8*  HCT 31.0* 25.4* 27.8*  MCV 101.3* 102.4* 101.8*  PLT 397 308 369    Basic Metabolic Panel: Recent Labs  Lab 05/30/20 2307 05/31/20 0338 05/31/20 0730 06/03/20 1210  NA 136 138  --  137  K 3.4* 3.4*  --  3.2*  CL 96* 103  --  101  CO2 30 27  --  27  GLUCOSE 113* 106*  --  209*  BUN 14 14  --  6*  CREATININE 0.73 0.66 0.74 0.56  CALCIUM 8.8* 8.2*  --  8.8*  MG  --  1.7  --   --   PHOS  --  3.2  --   --     GFR: Estimated Creatinine Clearance: 77.1 mL/min (by C-G formula based on SCr of 0.56 mg/dL).  Liver Function Tests: Recent Labs  Lab  05/30/20 2307 05/31/20 0338  AST 37 29  ALT 27 21  ALKPHOS 47 34*  BILITOT 0.3 0.5  PROT 5.3* 4.3*  ALBUMIN 1.6* 1.5*    CBG: Recent Labs  Lab 05/31/20 1146  GLUCAP 144*     Recent Results (from the past 240 hour(s))  Culture, blood (routine x 2)     Status: None   Collection Time: 05/30/20 11:02 PM   Specimen: BLOOD  Result Value Ref Range Status   Specimen Description BLOOD RIGHT ANTECUBITAL  Final   Special Requests   Final    BOTTLES DRAWN AEROBIC AND ANAEROBIC Blood Culture results may not be optimal due to an excessive volume of blood received in culture bottles  Culture   Final    NO GROWTH 5 DAYS Performed at [facility], [address] [place], [place] [address]    Report Status 06/04/2020 FINAL  Final  Urine culture     Status: None   Collection Time: 05/30/20 11:08 PM   Specimen: Urine, Random  Result Value Ref Range Status   Specimen Description URINE, RANDOM  Final   Special Requests NONE  Final   Culture   Final    NO GROWTH Performed at [facility], [address] [place], [place] [address]    Report Status 06/01/2020 FINAL  Final  Culture, blood (routine x 2)     Status: None (Preliminary result)   Collection Time: 05/30/20 11:47 PM   Specimen: BLOOD  Result Value Ref Range Status   Specimen Description BLOOD SITE NOT SPECIFIED  Final   Special Requests   Final    BOTTLES DRAWN AEROBIC ONLY Blood Culture results may not be optimal due to an inadequate volume of blood received in culture bottles   Culture   Final    NO GROWTH 4 DAYS Performed at [facility], [address], [place], [place] [address]    Report Status PENDING  Incomplete  SARS Coronavirus 2 by RT PCR (hospital order, performed in [facility] hospital lab) Nasopharyngeal Nasopharyngeal Swab     Status: None   Collection Time: 05/31/20  1:32 AM   Specimen: Nasopharyngeal Swab  Result Value Ref Range Status   SARS Coronavirus 2 NEGATIVE NEGATIVE Final     Comment: (NOTE) SARS-CoV-2 target nucleic acids are NOT DETECTED.  The SARS-CoV-2 RNA is generally detectable in upper and lower respiratory specimens during the acute phase of infection. The lowest concentration of SARS-CoV-2 viral copies this assay can detect is 250 copies / mL. A negative result does not preclude SARS-CoV-2 infection and should not be used as the sole basis for treatment or other patient management decisions.  A negative result may occur with improper specimen collection / handling, submission of specimen other than nasopharyngeal swab, presence of viral mutation(s) within the areas targeted by this assay, and inadequate number of viral copies (<250 copies / mL). A negative result must be combined with clinical observations, patient history, and epidemiological information.  Fact Sheet for Patients:   BoilerBrush.com.cy  Fact Sheet for Healthcare Providers: https://pope.com/  This test is not yet approved or  cleared by the [place] FDA and has been authorized for detection and/or diagnosis of SARS-CoV-2 by FDA under an Emergency Use Authorization (EUA).  This EUA will remain in effect (meaning this test can be used) for the duration of the COVID-19 declaration under Section 564(b)(1) of the Act, 21 U.S.C. section 360bbb-3(b)(1), unless the authorization is terminated or revoked sooner.  Performed at [facility], [address] [place], [place] [address]   MRSA PCR Screening     Status: Abnormal   Collection Time: 05/31/20  6:15 AM   Specimen: Nasal Mucosa; Nasopharyngeal  Result Value Ref Range Status   MRSA by PCR POSITIVE (A) NEGATIVE Final    Comment:        The GeneXpert MRSA Assay (FDA approved for NASAL specimens only), is one component of a comprehensive MRSA colonization surveillance program. It is not intended to diagnose MRSA infection nor to guide or monitor treatment for MRSA  infections. RESULT CALLED TO, READ BACK BY AND VERIFIED WITH: Truitt Leep RN 9:00 05/31/20 ([email or web address]) Performed at [facility], [address] [place], [place] [address]   MRSA PCR Screening     Status: Abnormal   Collection Time: 05/31/20 11:46 AM   Specimen: Nasopharyngeal  Result Value Ref Range Status   MRSA by PCR POSITIVE (A) NEGATIVE Final    Comment:        The GeneXpert MRSA Assay (FDA approved for NASAL specimens only), is one component of a comprehensive MRSA colonization surveillance program. It is not intended to diagnose MRSA infection nor to guide or monitor treatment for MRSA infections. CRITICAL VALUE NOTED.  VALUE IS CONSISTENT WITH PREVIOUSLY REPORTED AND CALLED VALUE. Performed at [facility], [address], [place], [place] [address]   Gastrointestinal Panel by PCR , Stool     Status: None   Collection Time: 06/01/20 11:45 AM   Specimen: Stool  Result Value Ref Range Status   Campylobacter species NOT DETECTED NOT DETECTED Final   Plesimonas shigelloides NOT DETECTED NOT DETECTED Final   Salmonella species NOT DETECTED NOT DETECTED Final   Yersinia enterocolitica NOT DETECTED NOT DETECTED Final   Vibrio species NOT DETECTED NOT DETECTED Final   Vibrio cholerae NOT DETECTED NOT DETECTED Final   Enteroaggregative E coli (EAEC) NOT DETECTED NOT DETECTED Final   Enteropathogenic E coli (EPEC) NOT DETECTED NOT DETECTED Final   Enterotoxigenic E coli (ETEC) NOT DETECTED NOT DETECTED Final   Shiga like toxin producing E coli (STEC) NOT DETECTED NOT DETECTED Final   Shigella/Enteroinvasive E coli (EIEC) NOT DETECTED NOT DETECTED Final   Cryptosporidium NOT DETECTED NOT DETECTED Final   Cyclospora cayetanensis NOT DETECTED NOT DETECTED Final   Entamoeba histolytica NOT DETECTED NOT DETECTED Final   Giardia lamblia NOT DETECTED NOT DETECTED Final   Adenovirus F40/41 NOT DETECTED NOT DETECTED Final   Astrovirus NOT DETECTED NOT DETECTED Final     Norovirus GI/GII NOT DETECTED NOT DETECTED Final   Rotavirus A NOT DETECTED NOT DETECTED Final   Sapovirus (I, II, IV, and V) NOT DETECTED NOT DETECTED Final    Comment: Performed at [facility], [address] [place], [place] [address]  C Difficile Quick Screen w PCR reflex     Status: None   Collection Time: 06/01/20 11:45 AM   Specimen: STOOL  Result Value Ref Range Status   C Diff antigen NEGATIVE NEGATIVE Final   C Diff toxin NEGATIVE NEGATIVE Final   C Diff interpretation No C. difficile detected.  Final    Comment: Performed at [facility], [address] [place], [place] [address]         Radiology Studies: No results found.      Scheduled Meds: . atorvastatin  10 mg Oral Q M,W,F  . budesonide  0.25 mg Inhalation BID  . calcium carbonate  1 tablet Oral TID WC  . carbamazepine  300 mg Oral BID  . Chlorhexidine Gluconate Cloth  6 each Topical Daily  . feeding supplement  1 Container Oral TID BM  . gabapentin  300 mg Oral TID  . heparin  5,000 Units Subcutaneous Q8H  . levETIRAcetam  500 mg Oral BID  . mouth rinse  15 mL Mouth Rinse BID  . multivitamin with minerals  1 tablet Oral Daily  . mupirocin ointment   Nasal BID  . potassium chloride  40 mEq Oral Daily  . potassium chloride  40 mEq Oral Once  . sodium chloride flush  3 mL Intravenous Q12H   Continuous Infusions: . sodium  chloride 10 mL/hr at 06/03/20 0800  . sodium chloride Stopped (05/31/20 1259)     LOS: 4 days        Kathlen Mody, MD [facility]   To contact the attending provider between 7A-7P or the covering provider during after hours 7P-7A, please log into the web site [email or web address] and access using universal [place] password for that web site. If you do not have the password, please call the hospital operator.  06/04/2020, 11:32 AM

## 2020-06-04 NOTE — Anesthesia Procedure Notes (Signed)
Procedure Name: MAC Date/Time: 06/04/2020 2:30 PM Performed by: Lavell Luster, CRNA Pre-anesthesia Checklist: Patient identified, Emergency Drugs available, Suction available, Patient being monitored and Timeout performed Patient Re-evaluated:Patient Re-evaluated prior to induction Oxygen Delivery Method: Nasal cannula Preoxygenation: Pre-oxygenation with 100% oxygen Induction Type: IV induction Placement Confirmation: breath sounds checked- equal and bilateral and positive ETCO2 Dental Injury: Teeth and Oropharynx as per pre-operative assessment

## 2020-06-04 NOTE — TOC Initial Note (Signed)
Transition of Care [facility]) - Initial/Assessment Note    Patient Details  Name: Nicole [name] MRN: [id number] Date of Birth: 1951/11/01  [facility]) CM/SW Contact:    Doy Hutching, LCSW Phone Number: 06/04/2020, 3:22 PM  Clinical Narrative:                 CSW spoke with pt sister Nicole [name] who is also her legal guardian via telephone, [phone]. Introduced self, role, reason for call. Pt from [facility]. She has recently d/c from hospital and was arranged to get therapy and nurses aide at home through [facility].   We discussed recommendations, pt sister prefers after speaking with MD and April Evans at group home for pt to return to home with home health at this time.   TOC team will continue to follow, pt sister to send this Clinical research associate contact information for [facility] I/DD coordinator and April at group home.   Expected Discharge Plan: Home w Home Health Services Barriers to Discharge: Continued Medical Work up   Patient Goals and CMS Choice Patient states their goals for this hospitalization and ongoing recovery are:: return to group home with home health CMS Medicare.gov Compare Post Acute Care list provided to:: Legal Guardian (pt sister Nicole [name] is her legal guardian) Choice offered to / list presented to : [facility] POA / Guardian, Sibling  Expected Discharge Plan and Services Expected Discharge Plan: Home w Home Health Services In-house Referral: Clinical Social Work Discharge Planning Services: CM Consult Post Acute Care Choice: Resumption of Svcs/PTA Provider, Home Health Living arrangements for the past 2 months: Group Home  Prior Living Arrangements/Services Living arrangements for the past 2 months: Group Home Lives with:: Facility Resident Patient language and need for interpreter reviewed:: Yes (no needs) Do you feel safe going back to the place where you live?: Yes      Need for Family Participation in Patient Care: Yes (Comment) (assistance with cares) Care  giver support system in place?: Yes (comment) (facility staff; sister; home health) Current home services: DME, Home OT, Home PT, Homehealth aide Criminal Activity/Legal Involvement Pertinent to Current Situation/Hospitalization: No - Comment as needed   Permission Sought/Granted Permission sought to share information with : Facility Medical sales representative, Family Supports Permission granted to share information with : No (pt w/ fluctuating orientation)  Share Information with NAME: Herminio Heads  Permission granted to share info w AGENCY: [facility]  Permission granted to share info w Relationship: sister and legal guardian  Permission granted to share info w Contact Information: [phone]  Emotional Assessment Appearance:: Other (Comment Required (telephonic assessment w/ sister) Attitude/Demeanor/Rapport: Other (comment) (telephonic assessment w/ sister) Affect (typically observed): Other (comment) (telephonic assessment w/ sister) Orientation: : Oriented to Self, Oriented to Situation, Oriented to Place, Fluctuating Orientation (Suspected and/or reported Sundowners) Alcohol / Substance Use: Not Applicable Psych Involvement: Outpatient Provider  Admission diagnosis:  Cellulitis [L03.90] Weakness [R53.1] Hypoxia [R09.02] Cellulitis of left lower extremity [L03.116] Sepsis (HCC) [A41.9] Diarrhea, unspecified type [R19.7] Patient Active Problem List   Diagnosis Date Noted  . Hypomagnesemia 05/31/2020  . Cellulitis 05/31/2020  . Sepsis (HCC) 05/31/2020  . Colitis 05/31/2020  . Ileus (HCC) 05/26/2020  . Altered mental status 05/17/2020  . Hyperlipidemia   . Hypertension   . Hypotension   . Seizures (HCC)   . AKI (acute kidney injury) (HCC)   . Dehydration   . Diarrhea   . Oral thrush 02/28/2012  . ALOPECIA 01/04/2010  . Allergic rhinitis 07/17/2008  .  PULMONARY NODULE 07/17/2008  . ENLARGEMENT OF LYMPH NODES 07/17/2008  . Cough 07/17/2008   PCP:  Elizabeth Palau,  [name] Pharmacy:   [facility] [place], [place] - [phone] [address] [place] [address] Phone: [phone] Fax: [phone]  Readmission Risk Interventions Readmission Risk Prevention Plan 06/04/2020  Post Dischage Appt Complete  Medication Screening Complete  Transportation Screening Complete  Some recent data might be hidden

## 2020-06-04 NOTE — Interval H&P Note (Signed)
History and Physical Interval Note:  06/04/2020 2:01 PM  Nicole [name]  has presented today for surgery, with the diagnosis of diarrhea, abnormal CT (sigmoid and descending colon thickening).  The various methods of treatment have been discussed with the patient and family. After consideration of risks, benefits and other options for treatment, the patient has consented to  Procedure(s): FLEXIBLE SIGMOIDOSCOPY (N/A) as a surgical intervention.  The patient's history has been reviewed, patient examined, no change in status, stable for surgery.  I have reviewed the patient's chart and labs.  Questions were answered to the patient's satisfaction.     Freddy Jaksch

## 2020-06-04 NOTE — Progress Notes (Signed)
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  She was recently admitted with a small bowel obstruction and diarrhea, discharged on 6/24.  She continued to have diarrhea after discharge, poor p.o. intake and was readmitted on 6/27 with shock, confusion.     PT Comments    Pt participated with very little encouragement needed.  She was anxious, but maintained focus with cuing and worked on sitting balance and hung on with bear hug to transfer via squat pivot to the recliner to work on sitting tolerance.    Follow Up Recommendations  SNF     Equipment Recommendations  None recommended by PT    Recommendations for Other Services       Precautions / Restrictions Precautions Precautions: Fall    Mobility  Bed Mobility Overal bed mobility: Needs Assistance       Supine to sit: Max assist     General bed mobility comments: total to scoot via pad to EOB,  Transfers Overall transfer level: Needs assistance   Transfers: Squat Pivot Transfers     Squat pivot transfers: Total assist;+2 physical assistance     General transfer comment: squat pivot with pt "bear hugging" therapist and blocked knees.  Pt gave effort with R>L LE, but not enough for bearing weight.  Ambulation/Gait                 Stairs             Wheelchair Mobility    Modified Rankin (Stroke Patients Only)       Balance Overall balance assessment: Needs assistance Sitting-balance support: Feet supported;Bilateral upper extremity supported;Single extremity supported Sitting balance-Leahy Scale: Poor Sitting balance - Comments: improved with time up at EOB, but generally mod assist, mostly biased right, but bobbing around midline.                                     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      Exercises Other Exercises Other Exercises: warm up ROM exercise to BUE and LE's    General Comments General comments (skin integrity, edema, etc.): EOB x 5 min working on balance,  VSS      Pertinent Vitals/Pain Pain Assessment: Faces Faces Pain Scale: Hurts a little bit Pain Location: general Pain Descriptors / Indicators: Grimacing Pain Intervention(s): Monitored during session    Home Living                      Prior Function            PT Goals (current goals can now be found in the care plan section) Acute Rehab PT Goals Patient Stated Goal: To improve strength and functional abilities PT Goal Formulation: With patient/family Time For Goal Achievement: 06/16/20 Potential to Achieve Goals: Fair Progress towards PT goals: Progressing toward goals (in that she participated and assisted)  Frequency    Min 2X/week      PT Plan Current plan remains appropriate    Co-evaluation              AM-PAC PT "6 Clicks" Mobility   Outcome Measure  Help needed turning from your back to your side while in a flat bed without using bedrails?: Total Help needed moving from lying on your back to sitting on the side of a flat bed without using bedrails?: Total Help needed moving to and from a bed to a chair (including a wheelchair)?: Total Help needed standing up from a chair using your arms (e.g., wheelchair or bedside chair)?: Total Help needed to walk in hospital room?: Total Help needed climbing 3-5 steps with a railing? : Total 6 Click Score: 6    End of Session   Activity Tolerance: Patient tolerated treatment well (anxious) Patient left: in chair;with call  bell/phone within reach;with chair alarm set;with family/visitor present (on sky lift pad.) Nurse Communication: Mobility status PT Visit Diagnosis: Other symptoms and signs involving the nervous system (R29.898);Other abnormalities of gait and mobility (R26.89)     Time: 8841-6606 PT Time Calculation (min) (ACUTE ONLY): 18 min  Charges:  $Therapeutic Activity: 8-22 mins                     06/04/2020  Jacinto Halim., PT Acute Rehabilitation Services [phone]  (pager) [phone]  (office)   Eliseo Gum Audris Speaker 06/04/2020, 11:37 AM

## 2020-06-05 LAB — CULTURE, BLOOD (ROUTINE X 2): Culture: NO GROWTH

## 2020-06-05 MED ORDER — CHOLESTYRAMINE 4 G PO PACK
4.0000 g | PACK | Freq: Two times a day (BID) | ORAL | Status: DC
  Administered 2020-06-05 – 2020-06-06 (×3): 4 g via ORAL
  Filled 2020-06-05 (×4): qty 1

## 2020-06-05 NOTE — Progress Notes (Signed)
Subjective: Patient seen and examined at bedside. She states she had [place] sandwich for dinner yesterday. As per her nurse she has had 2 large liquid brown bowel movements, mostly remains incontinent. Patient denies nausea, vomiting or abdominal pain.  Objective: Vital signs in last 24 hours: Temp:  [98.4 F (36.9 C)-99 F (37.2 C)] 98.4 F (36.9 C) (07/03 0605) Pulse Rate:  [77-100] 96 (07/03 0605) Resp:  [14-25] 18 (07/03 0605) BP: (106-135)/(45-82) 134/71 (07/03 0605) SpO2:  [91 %-100 %] 93 % (07/03 0851) Weight:  [88.6 kg-94.1 kg] 88.6 kg (07/03 0500) Weight change: 0 kg Last BM Date: 06/04/20  PE: Elderly, lying comfortably on bed, mild pallor GENERAL: Awake, is aware oriented to time place and person ABDOMEN: Soft, mild distention, normal active bowel sounds, nontender EXTREMITIES: No deformity  Lab Results: Results for orders placed or performed during the hospital encounter of 05/30/20 (from the past 48 hour(s))  Basic metabolic panel     Status: Abnormal   Collection Time: 06/03/20 12:10 PM  Result Value Ref Range   Sodium 137 135 - 145 mmol/L   Potassium 3.2 (L) 3.5 - 5.1 mmol/L   Chloride 101 98 - 111 mmol/L   CO2 27 22 - 32 mmol/L   Glucose, Bld 209 (H) 70 - 99 mg/dL    Comment: Glucose reference range applies only to samples taken after fasting for at least 8 hours.   BUN 6 (L) 8 - 23 mg/dL   Creatinine, Ser 9.38 0.44 - 1.00 mg/dL   Calcium 8.8 (L) 8.9 - 10.3 mg/dL   GFR calc non Af Amer >60 >60 mL/min   GFR calc Af Amer >60 >60 mL/min   Anion gap 9 5 - 15    Comment: Performed at [facility], [address] [place], [place] [address]  Iron and TIBC     Status: Abnormal   Collection Time: 06/04/20 12:41 PM  Result Value Ref Range   Iron 54 28 - 170 ug/dL   TIBC 371 (L) 696 - 789 ug/dL   Saturation Ratios 31 10.4 - 31.8 %   UIBC 118 ug/dL    Comment: Performed at [facility], [address], [place], [place] [address]  Ferritin      Status: None   Collection Time: 06/04/20 12:41 PM  Result Value Ref Range   Ferritin 249 11 - 307 ng/mL    Comment: Performed at [facility], [address], [place], [place] [address]  CBC with Differential/Platelet     Status: Abnormal   Collection Time: 06/04/20 12:41 PM  Result Value Ref Range   WBC 4.9 4.0 - 10.5 K/uL   RBC 3.20 (L) 3.87 - 5.11 MIL/uL   Hemoglobin 10.1 (L) 12.0 - 15.0 g/dL   HCT 58.5 (L) 36 - 46 %   MCV 99.7 80.0 - 100.0 fL   MCH 31.6 26.0 - 34.0 pg   MCHC 31.7 30.0 - 36.0 g/dL   RDW 27.7 82.4 - 23.5 %   Platelets 391 150 - 400 K/uL   nRBC 0.0 0.0 - 0.2 %   Neutrophils Relative % 79 %   Neutro Abs 3.9 1.7 - 7.7 K/uL   Lymphocytes Relative 14 %   Lymphs Abs 0.7 0.7 - 4.0 K/uL   Monocytes Relative 6 %   Monocytes Absolute 0.3 0 - 1 K/uL   Eosinophils Relative 1 %   Eosinophils Absolute 0.0 0 - 0 K/uL   Basophils Relative 0 %  Basophils Absolute 0.0 0 - 0 K/uL   nRBC 1 (H) 0 /100 WBC   Abs Immature Granulocytes 0.00 0.00 - 0.07 K/uL    Comment: Performed at [facility], [address], [place], [place] [address]  Basic metabolic panel     Status: Abnormal   Collection Time: 06/04/20 12:41 PM  Result Value Ref Range   Sodium 137 135 - 145 mmol/L   Potassium 4.0 3.5 - 5.1 mmol/L    Comment: DELTA CHECK NOTED NO VISIBLE HEMOLYSIS    Chloride 100 98 - 111 mmol/L   CO2 29 22 - 32 mmol/L   Glucose, Bld 108 (H) 70 - 99 mg/dL    Comment: Glucose reference range applies only to samples taken after fasting for at least 8 hours.   BUN 6 (L) 8 - 23 mg/dL   Creatinine, Ser 6.04 0.44 - 1.00 mg/dL   Calcium 9.5 8.9 - 54.0 mg/dL   GFR calc non Af Amer >60 >60 mL/min   GFR calc Af Amer >60 >60 mL/min   Anion gap 8 5 - 15    Comment: Performed at [facility], [address] [place], [place] [address]    Studies/Results: No results found.  Medications: I have reviewed the patient's current medications.  Assessment: Diarrhea-C. difficile  and GI pathogen panel negative Status post sigmoidoscopy and biopsies performed yesterday Normal WBC count, mild normocytic anemia, normal electrolytes and renal function  Plan: Recommend low fiber diet We will start patient on cholestyramine 4 g p.o. twice daily.  Kerin Salen, MD 06/05/2020, 9:11 AM

## 2020-06-05 NOTE — Progress Notes (Signed)
PROGRESS NOTE    Nicole [name]  ZOX:[id number] QUANT: August 27, 1951 DOA: 05/30/2020 PCP: Elizabeth Palau, [name]   Chief Complaint  Patient presents with  . Diarrhea    Brief Narrative:   69 year old female with history of seizure disorder, stroke with left hemiparesis, hypertension, hyperlipidemia presents with persistent diarrhea and generalized weakness.  Patient reports she had a prolonged hospital stay at [facility] long for small bowel obstruction and was discharged on the 24th June.  And since then she has been having diarrhea.  C. difficile PCR is negative and GI pathogen is negative.  CT of the abdomen and pelvis shows Colonic wall thickening with pericolonic edema and fat stranding involving the distal descending through sigmoid colon consistent with colitis, likely infectious or inflammatory. Pt underwent flex sigmoidoscopy showing ischemic colitis. Rectal tube was removed. She was started on cholestyramine and low fiber diet.    Assessment & Plan:   Active Problems:   Hyperlipidemia   Hypertension   Seizures (HCC)   Dehydration   Diarrhea   Hypomagnesemia   Cellulitis   Sepsis (HCC)   Colitis   Colitis infectious versus inflammatory Improving diarrhea, no nausea, vomiting or abdominal pain.   GI consulted, underwent flex sigmoidoscopy/colonoscopy showing  Altered vascular, congested, friable (with spontaneous bleeding), nodular, plaque covered, ulcerated and vascular-pattern-decreased mucosa in the sigmoid colon and in the descending colon. Biopsied. Overall pattern most consistent with ischemic colitis.. Rectal tube was removed. And she was started on cholestyramine. c diff pcr is negative, Gi pathogen pcr is negative.  Patient is currently on soft diet and is able to tolerate.    Essential hypertension Well controlled.     Seizure Continue with Tegretol 300 mg twice daily and Keppra 500 mg twice daily No seizures at this time.     Hyperlipidemia Continue with  the Lipitor 10 mg 3 times a week.    L3-L4 endplate sclerosis with endplate irregularity and erosions. Will need further work-up with an MRI of the lumbar spine as an outpatient     Anemia of chronic disease/anemia of acute illness Baseline hemoglobin appears to be around 12. Continue to drop from 12 to 10.1, 9.6, 8.8To 10.1 Anemia panel reviewed showed normal ferritin and iron levels. Flex sigmoidoscopy reviewed.    Hypokalemia Replaced   DVT prophylaxis: heparin.  Code Status: DNR Family Communication: family at bedside.  Disposition:   Status is: Inpatient  Remains inpatient appropriate because:Unsafe d/c plan   Dispo:  Patient From: Home  Planned Disposition: Group home with home PT and OT.  Expected discharge date: 06/06/20  Medically stable for discharge: No        Consultants:   Gastroenterology.   Procedures: none.  Antimicrobials:  Anti-infectives (From admission, onward)   Start     Dose/Rate Route Frequency Ordered Stop   06/01/20 0000  vancomycin (VANCOCIN) IVPB 1000 mg/200 mL premix  Status:  Discontinued        1,000 mg 200 mL/hr over 60 Minutes Intravenous Every 12 hours 05/31/20 1107 06/01/20 1027   05/31/20 1100  vancomycin (VANCOREADY) IVPB 1750 mg/350 mL        1,750 mg 175 mL/hr over 120 Minutes Intravenous  Once 05/31/20 1058 05/31/20 1501   05/31/20 0800  piperacillin-tazobactam (ZOSYN) IVPB 3.375 g  Status:  Discontinued        3.375 g 12.5 mL/hr over 240 Minutes Intravenous Every 8 hours 05/31/20 0217 06/03/20 0917   05/31/20 0230  piperacillin-tazobactam (ZOSYN) IVPB 3.375 g  3.375 g 100 mL/hr over 30 Minutes Intravenous  Once 05/31/20 0221 05/31/20 0357   05/31/20 0130  ceFAZolin (ANCEF) IVPB 1 g/50 mL premix  Status:  Discontinued        1 g 100 mL/hr over 30 Minutes Intravenous Every 8 hours 05/31/20 0121 05/31/20 0216   05/31/20 0000  ceFAZolin (ANCEF) IVPB 1 g/50 mL premix        1 g 100 mL/hr over 30 Minutes  Intravenous  Once 05/30/20 2345 05/31/20 0056         Subjective: No nausea, vomiting or abdominal pain.   Objective: Vitals:   06/05/20 0500 06/05/20 0605 06/05/20 0851 06/05/20 1407  BP:  134/71  (!) 111/56  Pulse:  96  89  Resp:  18  18  Temp:  98.4 F (36.9 C)  98.8 F (37.1 C)  TempSrc:  Oral  Oral  SpO2:  99% 93% 96%  Weight: 88.6 kg     Height:        Intake/Output Summary (Last 24 hours) at 06/05/2020 1740 Last data filed at 06/05/2020 1600 Gross per 24 hour  Intake 1077 ml  Output 1250 ml  Net -173 ml   Filed Weights   06/04/20 0500 06/04/20 1340 06/05/20 0500  Weight: 94.1 kg 94.1 kg 88.6 kg    Examination:  General exam: Alert and comfortable Respiratory system: Clear to auscultation bilaterally, no wheezing or rhonchi Cardiovascular system: S1-S2 heard, regular rate rhythm, no JVD, no pedal edema Gastrointestinal system: Abdomen is soft, nontender, nondistended, bowel sounds normal Central nervous system: Alert and oriented to person and place. Extremities: No cyanosis or clubbing Skin: No rashes seen Psychiatry: Mood is appropriate    Data Reviewed: I have personally reviewed following labs and imaging studies  CBC: Recent Labs  Lab 05/30/20 2307 05/31/20 0338 05/31/20 0730 06/04/20 1241  WBC 6.2 6.7 8.6 4.9  NEUTROABS 5.1 6.0  --  3.9  HGB 9.7* 7.9* 8.8* 10.1*  HCT 31.0* 25.4* 27.8* 31.9*  MCV 101.3* 102.4* 101.8* 99.7  PLT 397 308 369 391    Basic Metabolic Panel: Recent Labs  Lab 05/30/20 2307 05/31/20 0338 05/31/20 0730 06/03/20 1210 06/04/20 1241  NA 136 138  --  137 137  K 3.4* 3.4*  --  3.2* 4.0  CL 96* 103  --  101 100  CO2 30 27  --  27 29  GLUCOSE 113* 106*  --  209* 108*  BUN 14 14  --  6* 6*  CREATININE 0.73 0.66 0.74 0.56 0.47  CALCIUM 8.8* 8.2*  --  8.8* 9.5  MG  --  1.7  --   --   --   PHOS  --  3.2  --   --   --     GFR: Estimated Creatinine Clearance: 74.8 mL/min (by C-G formula based on SCr of 0.47  mg/dL).  Liver Function Tests: Recent Labs  Lab 05/30/20 2307 05/31/20 0338  AST 37 29  ALT 27 21  ALKPHOS 47 34*  BILITOT 0.3 0.5  PROT 5.3* 4.3*  ALBUMIN 1.6* 1.5*    CBG: Recent Labs  Lab 05/31/20 1146  GLUCAP 144*     Recent Results (from the past 240 hour(s))  Culture, blood (routine x 2)     Status: None   Collection Time: 05/30/20 11:02 PM   Specimen: BLOOD  Result Value Ref Range Status   Specimen Description BLOOD RIGHT ANTECUBITAL  Final   Special Requests   Final  BOTTLES DRAWN AEROBIC AND ANAEROBIC Blood Culture results may not be optimal due to an excessive volume of blood received in culture bottles   Culture   Final    NO GROWTH 5 DAYS Performed at [facility], [address], [place], [place] [address]    Report Status 06/04/2020 FINAL  Final  Urine culture     Status: None   Collection Time: 05/30/20 11:08 PM   Specimen: Urine, Random  Result Value Ref Range Status   Specimen Description URINE, RANDOM  Final   Special Requests NONE  Final   Culture   Final    NO GROWTH Performed at [facility], [address], [place], [place] [address]    Report Status 06/01/2020 FINAL  Final  Culture, blood (routine x 2)     Status: None   Collection Time: 05/30/20 11:47 PM   Specimen: BLOOD  Result Value Ref Range Status   Specimen Description BLOOD SITE NOT SPECIFIED  Final   Special Requests   Final    BOTTLES DRAWN AEROBIC ONLY Blood Culture results may not be optimal due to an inadequate volume of blood received in culture bottles   Culture   Final    NO GROWTH 5 DAYS Performed at [facility], [address], [place], [place] [address]    Report Status 06/05/2020 FINAL  Final  SARS Coronavirus 2 by RT PCR (hospital order, performed in [facility] hospital lab) Nasopharyngeal Nasopharyngeal Swab     Status: None   Collection Time: 05/31/20  1:32 AM   Specimen: Nasopharyngeal Swab  Result Value Ref Range Status   SARS  Coronavirus 2 NEGATIVE NEGATIVE Final    Comment: (NOTE) SARS-CoV-2 target nucleic acids are NOT DETECTED.  The SARS-CoV-2 RNA is generally detectable in upper and lower respiratory specimens during the acute phase of infection. The lowest concentration of SARS-CoV-2 viral copies this assay can detect is 250 copies / mL. A negative result does not preclude SARS-CoV-2 infection and should not be used as the sole basis for treatment or other patient management decisions.  A negative result may occur with improper specimen collection / handling, submission of specimen other than nasopharyngeal swab, presence of viral mutation(s) within the areas targeted by this assay, and inadequate number of viral copies (<250 copies / mL). A negative result must be combined with clinical observations, patient history, and epidemiological information.  Fact Sheet for Patients:   BoilerBrush.com.cy  Fact Sheet for Healthcare Providers: https://pope.com/  This test is not yet approved or  cleared by the [place] FDA and has been authorized for detection and/or diagnosis of SARS-CoV-2 by FDA under an Emergency Use Authorization (EUA).  This EUA will remain in effect (meaning this test can be used) for the duration of the COVID-19 declaration under Section 564(b)(1) of the Act, 21 U.S.C. section 360bbb-3(b)(1), unless the authorization is terminated or revoked sooner.  Performed at [facility], [address] [place], [place] [address]   MRSA PCR Screening     Status: Abnormal   Collection Time: 05/31/20  6:15 AM   Specimen: Nasal Mucosa; Nasopharyngeal  Result Value Ref Range Status   MRSA by PCR POSITIVE (A) NEGATIVE Final    Comment:        The GeneXpert MRSA Assay (FDA approved for NASAL specimens only), is one component of a comprehensive MRSA colonization surveillance program. It is not intended to diagnose MRSA infection nor to  guide or  monitor treatment for MRSA infections. RESULT CALLED TO, READ BACK BY AND VERIFIED WITH: Truitt Leep RN 9:00 05/31/20 ([email or web address]) Performed at [facility], [address], [place], [place] [address]   MRSA PCR Screening     Status: Abnormal   Collection Time: 05/31/20 11:46 AM   Specimen: Nasopharyngeal  Result Value Ref Range Status   MRSA by PCR POSITIVE (A) NEGATIVE Final    Comment:        The GeneXpert MRSA Assay (FDA approved for NASAL specimens only), is one component of a comprehensive MRSA colonization surveillance program. It is not intended to diagnose MRSA infection nor to guide or monitor treatment for MRSA infections. CRITICAL VALUE NOTED.  VALUE IS CONSISTENT WITH PREVIOUSLY REPORTED AND CALLED VALUE. Performed at [facility], [address] [place], [place] [address]   Gastrointestinal Panel by PCR , Stool     Status: None   Collection Time: 06/01/20 11:45 AM   Specimen: Stool  Result Value Ref Range Status   Campylobacter species NOT DETECTED NOT DETECTED Final   Plesimonas shigelloides NOT DETECTED NOT DETECTED Final   Salmonella species NOT DETECTED NOT DETECTED Final   Yersinia enterocolitica NOT DETECTED NOT DETECTED Final   Vibrio species NOT DETECTED NOT DETECTED Final   Vibrio cholerae NOT DETECTED NOT DETECTED Final   Enteroaggregative E coli (EAEC) NOT DETECTED NOT DETECTED Final   Enteropathogenic E coli (EPEC) NOT DETECTED NOT DETECTED Final   Enterotoxigenic E coli (ETEC) NOT DETECTED NOT DETECTED Final   Shiga like toxin producing E coli (STEC) NOT DETECTED NOT DETECTED Final   Shigella/Enteroinvasive E coli (EIEC) NOT DETECTED NOT DETECTED Final   Cryptosporidium NOT DETECTED NOT DETECTED Final   Cyclospora cayetanensis NOT DETECTED NOT DETECTED Final   Entamoeba histolytica NOT DETECTED NOT DETECTED Final   Giardia lamblia NOT DETECTED NOT DETECTED Final   Adenovirus F40/41 NOT DETECTED NOT DETECTED Final    Astrovirus NOT DETECTED NOT DETECTED Final   Norovirus GI/GII NOT DETECTED NOT DETECTED Final   Rotavirus A NOT DETECTED NOT DETECTED Final   Sapovirus (I, II, IV, and V) NOT DETECTED NOT DETECTED Final    Comment: Performed at [facility], [address] [place], [place] [address]  C Difficile Quick Screen w PCR reflex     Status: None   Collection Time: 06/01/20 11:45 AM   Specimen: STOOL  Result Value Ref Range Status   C Diff antigen NEGATIVE NEGATIVE Final   C Diff toxin NEGATIVE NEGATIVE Final   C Diff interpretation No C. difficile detected.  Final    Comment: Performed at [facility], [address], [place], [place] [address]         Radiology Studies: No results found.      Scheduled Meds: . atorvastatin  10 mg Oral Q M,W,F  . budesonide  0.25 mg Inhalation BID  . calcium carbonate  1 tablet Oral TID WC  . carbamazepine  300 mg Oral BID  . Chlorhexidine Gluconate Cloth  6 each Topical Daily  . cholestyramine  4 g Oral BID  . feeding supplement  1 Container Oral TID BM  . gabapentin  300 mg Oral TID  . heparin  5,000 Units Subcutaneous Q8H  . levETIRAcetam  500 mg Oral BID  . mouth rinse  15 mL Mouth Rinse BID  . multivitamin with minerals  1 tablet Oral Daily  . mupirocin ointment   Nasal BID  . potassium  chloride  40 mEq Oral Daily  . sodium chloride flush  3 mL Intravenous Q12H   Continuous Infusions: . sodium chloride 10 mL/hr at 06/03/20 0800  . sodium chloride Stopped (05/31/20 1259)     LOS: 5 days        Kathlen ModyVijaya Lenia Housley, MD [facility]   To contact the attending provider between 7A-7P or the covering provider during after hours 7P-7A, please log into the web site [email or web address] and access using universal [place] password for that web site. If you do not have the password, please call the hospital operator.  06/05/2020, 5:40 PM

## 2020-06-06 ENCOUNTER — Inpatient Hospital Stay (HOSPITAL_COMMUNITY): Payer: Medicare Other

## 2020-06-06 MED ORDER — CHOLESTYRAMINE 4 G PO PACK
4.0000 g | PACK | Freq: Three times a day (TID) | ORAL | Status: DC
  Administered 2020-06-06 – 2020-06-17 (×23): 4 g via ORAL
  Filled 2020-06-06 (×35): qty 1

## 2020-06-06 NOTE — Progress Notes (Signed)
Lab staff informs me that patient refused blood/lab work this morning.

## 2020-06-06 NOTE — Progress Notes (Signed)
PROGRESS NOTE    Nicole [name]  IOX:[id number] DOB: [birth year] DOA: 05/30/2020 PCP: Elizabeth Palau, [name]   Chief Complaint  Patient presents with  . Diarrhea    Brief Narrative:   69 year old female with history of seizure disorder, stroke with left hemiparesis, hypertension, hyperlipidemia presents with persistent diarrhea and generalized weakness.  Patient reports she had a prolonged hospital stay at [facility] long for small bowel obstruction and was discharged on the 24th June.  And since then she has been having diarrhea.  C. difficile PCR is negative and GI pathogen is negative.  CT of the abdomen and pelvis shows Colonic wall thickening with pericolonic edema and fat stranding involving the distal descending through sigmoid colon consistent with colitis, likely infectious or inflammatory. Pt underwent flex sigmoidoscopy showing ischemic colitis. Rectal tube was removed. She was started on cholestyramine and low fiber diet.  Patient seen and examined today, reports some nausea vomiting yesterday.  GI ordered diarrhea abdominal film   Assessment & Plan:   Active Problems:   Hyperlipidemia   Hypertension   Seizures (HCC)   Dehydration   Diarrhea   Hypomagnesemia   Cellulitis   Sepsis (HCC)   Colitis   Ischemic colitis Improving diarrhea, nausea persistent 1 episode of vomiting yesterday.    GI consulted, underwent flex sigmoidoscopy/colonoscopy showing  Altered vascular, congested, friable (with spontaneous bleeding), nodular, plaque covered, ulcerated and vascular-pattern-decreased mucosa in the sigmoid colon and in the descending colon. Biopsied. Overall pattern most consistent with ischemic colitis.. Rectal tube was removed. And she was started on cholestyramine. Abdominal film today shows Abundant stool in the ascending colon with suggestion of haustral thickening particularly in the cecum. Haustral thickening likely reflecting colitis in this patient with known colitis.   Increase the dose of cholestyramine to 3 times daily . c diff pcr is negative, Gi pathogen pcr is negative.  Patient is currently on soft diet and is able to tolerate.    Essential hypertension Well-controlled blood pressure parameters    Seizure Continue with Tegretol 300 mg twice daily and Keppra 500 mg twice daily No seizures at this time.     Hyperlipidemia Continue with the Lipitor 10 mg 3 times a week.    L3-L4 endplate sclerosis with endplate irregularity and erosions. Will need further work-up with an MRI of the lumbar spine as an outpatient     Anemia of acute illness Baseline hemoglobin appears to be around 12. Slow drop in hemoglobin  from 12 to 10.1, 9.6, 8.8 to  10.1 Anemia panel reviewed showed normal ferritin and iron levels. Flex sigmoidoscopy reviewed. No bleeding per rectum.    Hypokalemia Replaced, recheck labs today   DVT prophylaxis: heparin.  Code Status: DNR Family Communication: None at bedside this morning Disposition:   Status is: Inpatient  Remains inpatient appropriate because:Unsafe d/c plan   Dispo:  Patient From: Home  Planned Disposition: Group home with home PT and OT.  Expected discharge date: 06/07/20  Medically stable for discharge: No        Consultants:   Gastroenterology.   Procedures: none.  Antimicrobials:  Anti-infectives (From admission, onward)   Start     Dose/Rate Route Frequency Ordered Stop   06/01/20 0000  vancomycin (VANCOCIN) IVPB 1000 mg/200 mL premix  Status:  Discontinued        1,000 mg 200 mL/hr over 60 Minutes Intravenous Every 12 hours 05/31/20 1107 06/01/20 1027   05/31/20 1100  vancomycin (VANCOREADY) IVPB 1750 mg/350 mL  1,750 mg 175 mL/hr over 120 Minutes Intravenous  Once 05/31/20 1058 05/31/20 1501   05/31/20 0800  piperacillin-tazobactam (ZOSYN) IVPB 3.375 g  Status:  Discontinued        3.375 g 12.5 mL/hr over 240 Minutes Intravenous Every 8 hours 05/31/20 0217 06/03/20  0917   05/31/20 0230  piperacillin-tazobactam (ZOSYN) IVPB 3.375 g        3.375 g 100 mL/hr over 30 Minutes Intravenous  Once 05/31/20 0221 05/31/20 0357   05/31/20 0130  ceFAZolin (ANCEF) IVPB 1 g/50 mL premix  Status:  Discontinued        1 g 100 mL/hr over 30 Minutes Intravenous Every 8 hours 05/31/20 0121 05/31/20 0216   05/31/20 0000  ceFAZolin (ANCEF) IVPB 1 g/50 mL premix        1 g 100 mL/hr over 30 Minutes Intravenous  Once 05/30/20 2345 05/31/20 0056         Subjective: Nausea with 1 episode of vomiting yesterday abdominal still distended, no chest pain or shortness of breath.  Objective: Vitals:   06/05/20 2057 06/06/20 0511 06/06/20 0839 06/06/20 1442  BP: 120/62 118/61  124/71  Pulse: 90 91  97  Resp: 18 18  15   Temp: 98.7 F (37.1 C) 98.4 F (36.9 C)  98.7 F (37.1 C)  TempSrc: Oral Oral    SpO2: 96% 97% 94% 95%  Weight:      Height:        Intake/Output Summary (Last 24 hours) at 06/06/2020 1611 Last data filed at 06/06/2020 1230 Gross per 24 hour  Intake 0 ml  Output 602 ml  Net -602 ml   Filed Weights   06/04/20 0500 06/04/20 1340 06/05/20 0500  Weight: 94.1 kg 94.1 kg 88.6 kg    Examination:  General exam: Alert, comfortable not in any kind of distress Respiratory system: Clear to auscultation bilaterally, no wheezing or rhonchi no tachypnea Cardiovascular system: S1-S2 heard, regular rate rhythm, no JVD, no pedal edema Gastrointestinal system: Abdomen is soft, distended, nontender, bowel sounds normal Central nervous system: Alert and oriented to person only at this morning, able to move all extremities. Extremities: No pedal edema, cyanosis Skin: No rashes seen Psychiatry: Mood is appropriate    Data Reviewed: I have personally reviewed following labs and imaging studies  CBC: Recent Labs  Lab 05/30/20 2307 05/31/20 0338 05/31/20 0730 06/04/20 1241  WBC 6.2 6.7 8.6 4.9  NEUTROABS 5.1 6.0  --  3.9  HGB 9.7* 7.9* 8.8* 10.1*  HCT  31.0* 25.4* 27.8* 31.9*  MCV 101.3* 102.4* 101.8* 99.7  PLT 397 308 369 391    Basic Metabolic Panel: Recent Labs  Lab 05/30/20 2307 05/31/20 0338 05/31/20 0730 06/03/20 1210 06/04/20 1241  NA 136 138  --  137 137  K 3.4* 3.4*  --  3.2* 4.0  CL 96* 103  --  101 100  CO2 30 27  --  27 29  GLUCOSE 113* 106*  --  209* 108*  BUN 14 14  --  6* 6*  CREATININE 0.73 0.66 0.74 0.56 0.47  CALCIUM 8.8* 8.2*  --  8.8* 9.5  MG  --  1.7  --   --   --   PHOS  --  3.2  --   --   --     GFR: Estimated Creatinine Clearance: 74.8 mL/min (by C-G formula based on SCr of 0.47 mg/dL).  Liver Function Tests: Recent Labs  Lab 05/30/20 2307 05/31/20 0338  AST  37 29  ALT 27 21  ALKPHOS 47 34*  BILITOT 0.3 0.5  PROT 5.3* 4.3*  ALBUMIN 1.6* 1.5*    CBG: Recent Labs  Lab 05/31/20 1146  GLUCAP 144*     Recent Results (from the past 240 hour(s))  Culture, blood (routine x 2)     Status: None   Collection Time: 05/30/20 11:02 PM   Specimen: BLOOD  Result Value Ref Range Status   Specimen Description BLOOD RIGHT ANTECUBITAL  Final   Special Requests   Final    BOTTLES DRAWN AEROBIC AND ANAEROBIC Blood Culture results may not be optimal due to an excessive volume of blood received in culture bottles   Culture   Final    NO GROWTH 5 DAYS Performed at [facility], [address], [place], [place] [address]    Report Status 06/04/2020 FINAL  Final  Urine culture     Status: None   Collection Time: 05/30/20 11:08 PM   Specimen: Urine, Random  Result Value Ref Range Status   Specimen Description URINE, RANDOM  Final   Special Requests NONE  Final   Culture   Final    NO GROWTH Performed at [facility], [address] [place], [place] [address]    Report Status 06/01/2020 FINAL  Final  Culture, blood (routine x 2)     Status: None   Collection Time: 05/30/20 11:47 PM   Specimen: BLOOD  Result Value Ref Range Status   Specimen Description BLOOD SITE NOT SPECIFIED   Final   Special Requests   Final    BOTTLES DRAWN AEROBIC ONLY Blood Culture results may not be optimal due to an inadequate volume of blood received in culture bottles   Culture   Final    NO GROWTH 5 DAYS Performed at [facility], [address] [place], [place] [address]    Report Status 06/05/2020 FINAL  Final  SARS Coronavirus 2 by RT PCR (hospital order, performed in [facility] hospital lab) Nasopharyngeal Nasopharyngeal Swab     Status: None   Collection Time: 05/31/20  1:32 AM   Specimen: Nasopharyngeal Swab  Result Value Ref Range Status   SARS Coronavirus 2 NEGATIVE NEGATIVE Final    Comment: (NOTE) SARS-CoV-2 target nucleic acids are NOT DETECTED.  The SARS-CoV-2 RNA is generally detectable in upper and lower respiratory specimens during the acute phase of infection. The lowest concentration of SARS-CoV-2 viral copies this assay can detect is 250 copies / mL. A negative result does not preclude SARS-CoV-2 infection and should not be used as the sole basis for treatment or other patient management decisions.  A negative result may occur with improper specimen collection / handling, submission of specimen other than nasopharyngeal swab, presence of viral mutation(s) within the areas targeted by this assay, and inadequate number of viral copies (<250 copies / mL). A negative result must be combined with clinical observations, patient history, and epidemiological information.  Fact Sheet for Patients:   BoilerBrush.com.cy  Fact Sheet for Healthcare Providers: https://pope.com/  This test is not yet approved or  cleared by the [place] FDA and has been authorized for detection and/or diagnosis of SARS-CoV-2 by FDA under an Emergency Use Authorization (EUA).  This EUA will remain in effect (meaning this test can be used) for the duration of the COVID-19 declaration under Section 564(b)(1) of the Act, 21  U.S.C. section 360bbb-3(b)(1), unless the authorization is terminated or revoked sooner.  Performed at  [facility], [address] [place], [place] [address]   MRSA PCR Screening     Status: Abnormal   Collection Time: 05/31/20  6:15 AM   Specimen: Nasal Mucosa; Nasopharyngeal  Result Value Ref Range Status   MRSA by PCR POSITIVE (A) NEGATIVE Final    Comment:        The GeneXpert MRSA Assay (FDA approved for NASAL specimens only), is one component of a comprehensive MRSA colonization surveillance program. It is not intended to diagnose MRSA infection nor to guide or monitor treatment for MRSA infections. RESULT CALLED TO, READ BACK BY AND VERIFIED WITH: Truitt Leep RN 9:00 05/31/20 ([email or web address]) Performed at [facility], [address], [place], [place] [address]   MRSA PCR Screening     Status: Abnormal   Collection Time: 05/31/20 11:46 AM   Specimen: Nasopharyngeal  Result Value Ref Range Status   MRSA by PCR POSITIVE (A) NEGATIVE Final    Comment:        The GeneXpert MRSA Assay (FDA approved for NASAL specimens only), is one component of a comprehensive MRSA colonization surveillance program. It is not intended to diagnose MRSA infection nor to guide or monitor treatment for MRSA infections. CRITICAL VALUE NOTED.  VALUE IS CONSISTENT WITH PREVIOUSLY REPORTED AND CALLED VALUE. Performed at [facility], [address], [place], [place] [address]   Gastrointestinal Panel by PCR , Stool     Status: None   Collection Time: 06/01/20 11:45 AM   Specimen: Stool  Result Value Ref Range Status   Campylobacter species NOT DETECTED NOT DETECTED Final   Plesimonas shigelloides NOT DETECTED NOT DETECTED Final   Salmonella species NOT DETECTED NOT DETECTED Final   Yersinia enterocolitica NOT DETECTED NOT DETECTED Final   Vibrio species NOT DETECTED NOT DETECTED Final   Vibrio cholerae NOT DETECTED NOT DETECTED Final   Enteroaggregative E coli (EAEC)  NOT DETECTED NOT DETECTED Final   Enteropathogenic E coli (EPEC) NOT DETECTED NOT DETECTED Final   Enterotoxigenic E coli (ETEC) NOT DETECTED NOT DETECTED Final   Shiga like toxin producing E coli (STEC) NOT DETECTED NOT DETECTED Final   Shigella/Enteroinvasive E coli (EIEC) NOT DETECTED NOT DETECTED Final   Cryptosporidium NOT DETECTED NOT DETECTED Final   Cyclospora cayetanensis NOT DETECTED NOT DETECTED Final   Entamoeba histolytica NOT DETECTED NOT DETECTED Final   Giardia lamblia NOT DETECTED NOT DETECTED Final   Adenovirus F40/41 NOT DETECTED NOT DETECTED Final   Astrovirus NOT DETECTED NOT DETECTED Final   Norovirus GI/GII NOT DETECTED NOT DETECTED Final   Rotavirus A NOT DETECTED NOT DETECTED Final   Sapovirus (I, II, IV, and V) NOT DETECTED NOT DETECTED Final    Comment: Performed at [facility], [address] [place], [place] [address]  C Difficile Quick Screen w PCR reflex     Status: None   Collection Time: 06/01/20 11:45 AM   Specimen: STOOL  Result Value Ref Range Status   C Diff antigen NEGATIVE NEGATIVE Final   C Diff toxin NEGATIVE NEGATIVE Final   C Diff interpretation No C. difficile detected.  Final    Comment: Performed at [facility], [address] [place], [place] [address]         Radiology Studies: DG Abd 1 View  Result Date: 06/06/2020 CLINICAL DATA:  Nausea vomiting. EXAM: ABDOMEN - 1 VIEW COMPARISON:  05/31/2020 CT evaluation and prior plain film evaluation FINDINGS: Abundant stool is present  in the ascending colon. Some suggestion of haustral thickening particularly in the cecum. Pattern of distension is similar to previous imaging. Spinal degenerative changes with similar appearance to prior studies greatest in the mid lumbar spine. IMPRESSION: Abundant stool in the ascending colon with suggestion of haustral thickening particularly in the cecum. Haustral thickening likely reflecting colitis in this patient with known colitis. No  change in colonic distension compared to recent imaging. Electronically Signed   By: Donzetta Kohut M.D.   On: 06/06/2020 13:24        Scheduled Meds: . atorvastatin  10 mg Oral Q M,W,F  . budesonide  0.25 mg Inhalation BID  . calcium carbonate  1 tablet Oral TID WC  . carbamazepine  300 mg Oral BID  . Chlorhexidine Gluconate Cloth  6 each Topical Daily  . cholestyramine  4 g Oral TID  . feeding supplement  1 Container Oral TID BM  . gabapentin  300 mg Oral TID  . heparin  5,000 Units Subcutaneous Q8H  . levETIRAcetam  500 mg Oral BID  . mouth rinse  15 mL Mouth Rinse BID  . multivitamin with minerals  1 tablet Oral Daily  . mupirocin ointment   Nasal BID  . potassium chloride  40 mEq Oral Daily  . sodium chloride flush  3 mL Intravenous Q12H   Continuous Infusions: . sodium chloride 10 mL/hr at 06/03/20 0800  . sodium chloride Stopped (05/31/20 1259)     LOS: 6 days        Kathlen Mody, MD [facility]   To contact the attending provider between 7A-7P or the covering provider during after hours 7P-7A, please log into the web site [email or web address] and access using universal [place] password for that web site. If you do not have the password, please call the hospital operator.  06/06/2020, 4:11 PM

## 2020-06-06 NOTE — Progress Notes (Signed)
Subjective: Patient seen and examined at bedside in presence of her sister. She reports an episode of vomiting yesterday evening. As per the nurse the frequency of bowel movement has decreased but continues to be loose but not watery.  Objective: Vital signs in last 24 hours: Temp:  [98.4 F (36.9 C)-98.8 F (37.1 C)] 98.4 F (36.9 C) (07/04 0511) Pulse Rate:  [89-91] 91 (07/04 0511) Resp:  [18] 18 (07/04 0511) BP: (111-120)/(56-62) 118/61 (07/04 0511) SpO2:  [93 %-97 %] 94 % (07/04 0839) Weight change:  Last BM Date: 06/06/20  PE: Lying comfortably on bed GENERAL: Mild pallor, no icterus ABDOMEN: Slightly distended abdomen but nontender, normoactive bowel sounds EXTREMITIES: No deformity  Lab Results: Results for orders placed or performed during the hospital encounter of 05/30/20 (from the past 48 hour(s))  Iron and TIBC     Status: Abnormal   Collection Time: 06/04/20 12:41 PM  Result Value Ref Range   Iron 54 28 - 170 ug/dL   TIBC 563 (L) 875 - 643 ug/dL   Saturation Ratios 31 10.4 - 31.8 %   UIBC 118 ug/dL    Comment: Performed at [facility], [address] [place], [place] [address]  Ferritin     Status: None   Collection Time: 06/04/20 12:41 PM  Result Value Ref Range   Ferritin 249 11 - 307 ng/mL    Comment: Performed at [facility], [address], [place], [place] [address]  CBC with Differential/Platelet     Status: Abnormal   Collection Time: 06/04/20 12:41 PM  Result Value Ref Range   WBC 4.9 4.0 - 10.5 K/uL   RBC 3.20 (L) 3.87 - 5.11 MIL/uL   Hemoglobin 10.1 (L) 12.0 - 15.0 g/dL   HCT 60.6 (L) 36 - 46 %   MCV 99.7 80.0 - 100.0 fL   MCH 31.6 26.0 - 34.0 pg   MCHC 31.7 30.0 - 36.0 g/dL   RDW 30.1 60.1 - 09.3 %   Platelets 391 150 - 400 K/uL   nRBC 0.0 0.0 - 0.2 %   Neutrophils Relative % 79 %   Neutro Abs 3.9 1.7 - 7.7 K/uL   Lymphocytes Relative 14 %   Lymphs Abs 0.7 0.7 - 4.0 K/uL   Monocytes Relative 6 %   Monocytes Absolute 0.3 0  - 1 K/uL   Eosinophils Relative 1 %   Eosinophils Absolute 0.0 0 - 0 K/uL   Basophils Relative 0 %   Basophils Absolute 0.0 0 - 0 K/uL   nRBC 1 (H) 0 /100 WBC   Abs Immature Granulocytes 0.00 0.00 - 0.07 K/uL    Comment: Performed at [facility], [address], [place], [place] [address]  Basic metabolic panel     Status: Abnormal   Collection Time: 06/04/20 12:41 PM  Result Value Ref Range   Sodium 137 135 - 145 mmol/L   Potassium 4.0 3.5 - 5.1 mmol/L    Comment: DELTA CHECK NOTED NO VISIBLE HEMOLYSIS    Chloride 100 98 - 111 mmol/L   CO2 29 22 - 32 mmol/L   Glucose, Bld 108 (H) 70 - 99 mg/dL    Comment: Glucose reference range applies only to samples taken after fasting for at least 8 hours.   BUN 6 (L) 8 - 23 mg/dL   Creatinine, Ser 3.22 0.44 - 1.00 mg/dL   Calcium 9.5 8.9 - 02.5 mg/dL   GFR calc non Af Amer >60 >60  mL/min   GFR calc Af Amer >60 >60 mL/min   Anion gap 8 5 - 15    Comment: Performed at [facility], [address] [place], [place] [address]    Studies/Results: No results found.  Medications: I have reviewed the patient's current medications.  Assessment: Changes of ischemic colitis noted on flexible sigmoidoscopy, biopsies pending Diarrhea, minimal improvement 1 episode of nausea and vomiting yesterday  Plan: Increase cholestyramine to 3 times daily Abdominal x-ray today  Kerin Salen, MD 06/06/2020, 12:24 PM

## 2020-06-06 NOTE — Progress Notes (Signed)
Lab staff informs that pt refused blood/lab work. Educated pt regarding importance of getting blood work and tried to convince but pt adamantly refused it.

## 2020-06-07 ENCOUNTER — Inpatient Hospital Stay (HOSPITAL_COMMUNITY): Payer: Medicare Other

## 2020-06-07 ENCOUNTER — Encounter (HOSPITAL_COMMUNITY): Payer: Self-pay | Admitting: Gastroenterology

## 2020-06-07 DIAGNOSIS — L8990 Pressure ulcer of unspecified site, unspecified stage: Secondary | ICD-10-CM | POA: Clinically undetermined

## 2020-06-07 MED ORDER — ENOXAPARIN SODIUM 100 MG/ML ~~LOC~~ SOLN
90.0000 mg | Freq: Once | SUBCUTANEOUS | Status: AC
  Administered 2020-06-07: 90 mg via SUBCUTANEOUS
  Filled 2020-06-07: qty 0.9

## 2020-06-07 MED ORDER — GADOBUTROL 1 MMOL/ML IV SOLN
8.5000 mL | Freq: Once | INTRAVENOUS | Status: AC | PRN
  Administered 2020-06-07: 8.5 mL via INTRAVENOUS

## 2020-06-07 MED ORDER — IOHEXOL 350 MG/ML SOLN
100.0000 mL | Freq: Once | INTRAVENOUS | Status: AC | PRN
  Administered 2020-06-07: 100 mL via INTRAVENOUS

## 2020-06-07 NOTE — Progress Notes (Signed)
ANTICOAGULATION CONSULT NOTE - Initial Consult  Pharmacy Consult for IV Heparin Indication: pulmonary embolus  Allergies  Allergen Reactions  . Seasonal Ic [Cholestatin]     Runny nose    Patient Measurements: Height: 5\' 7"  (170.2 cm) Weight: 88.6 kg (195 lb 5.2 oz) IBW/kg (Calculated) : 61.6 Heparin Dosing Weight: 82.1 kg  Vital Signs: Temp: 98.6 F (37 C) (07/05 1435) BP: 135/60 (07/05 1435) Pulse Rate: 99 (07/05 1435)  Labs: No results for input(s): HGB, HCT, PLT, APTT, LABPROT, INR, HEPARINUNFRC, HEPRLOWMOCWT, CREATININE, CKTOTAL, CKMB, TROPONINIHS in the last 72 hours.  Estimated Creatinine Clearance: 74.8 mL/min (by C-G formula based on SCr of 0.47 mg/dL).   Medical History: Past Medical History:  Diagnosis Date  . 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 Allergic rhinitis   . Alopecia   . Discoid lupus   . Hyperlipidemia   . Hypertension   . Menopause   . Moderate mental retardation   . Obesity   . Osteoporosis   . Pulmonary nodules   . Seizures (HCC)    focal-being by Dr. 06-19-1970 (should not take anti-histamines)    Assessment: 69 yr old female admitted on 05/30/20 with persistent diarrhea/generalized weakness, diagnosed with ischemic colitis. Pt underwent flex sig/colonoscopy on 06/04/20 showing altered vascular, congested, friable (with spontaneous bleeding), nodular, plaque covered, ulcerated and vascular-pattern-decreased mucosa in the sigmoid colon and in the descending colon.   Today, CT of abdomen and chest revealed thrombus in RLL pulmonary artery and branches. Vascular U/S on 6/29 revealed no evidence of DVT in lower extremities. Pharmacy is consulted to dose heparin for PE. Per med rec, pt was not on anticoagulation PTA.   Pt has been receiving heparin 5000 units SQ Q 8 hrs for VTE prophylaxis (last dose given on 06/06/20 at 0632 AM; of note, pt has refused the last 4 doses of SQ heparin).  H/H (last on 06/04/20): 10.1/31.9, platelets 391. No  bleeding per rectum.   Provider note (and confirmed with RN) states that pt is refusing blood draws. Discussed with Dr. 10-28-1998 the issue of not being able to monitor heparin levels in this pt since she is refusing blood draws.  Goal of Therapy:  Heparin level: 0.3-0.5 units/ml (would use lower goal, due to recent findings of bleeding in colon) Monitor platelets by anticoagulation protocol: Yes   Plan:  Due to pt's refusing blood draws (making monitoring IV heparin therapy difficult), discussed with Dr. Antionette Char and will give 1 dose of therapeutic Lovenox tonight and have primary team (with GI input) determine best option for pt's anticoagulation tomorrow (including transition to oral agent for  discharge) Lovenox 90 mg Payette X 1 this evening Monitor CBC as able (if pt consents to blood draw) Monitor for signs/symptoms of bleeding F/U transition to oral anticoagulant  Antionette Char, PharmD, BCPS, [facility] Clinical Pharmacist 06/07/2020,7:15 PM

## 2020-06-07 NOTE — TOC Progression Note (Addendum)
Transition of Care [facility]) - Progression Note    Patient Details  Name: Nicole [name] MRN: [id number] Date of Birth: [birth year]  [facility] CM/SW Contact  Erastus Bartolomei, Adria Devon, RN Phone Number: 06/07/2020, 10:16 AM  Clinical Narrative:     Sherron Monday to [employer] Rosenthal legal [phone] and confirmed her preference is for Kyung to return to group home with [facility] Spoke to [employer] with [facility] on Friday they can accept patient back for HHPT.   [name] will send this Engineer, building services information for [facility] I/DD coordinator and April at group home via text.   April Evans at Group Home [phone] NCM called no answer voicemail full.   Called [place] [facility] [phone] , she will reach April and ask her to call NCM back. Awaiting call back.  April Evans from Group Home returned call. She will accept patient back with [facility]. Patient already has hospital bed, hoyer lift, and wheel chair. At discharge April prefers patient to transport by [facility]. Confirmed face sheet address. Also at discharge bedside nurse can call April to go over AVS and send hard copy of AVS with patient.  Expected Discharge Plan: Home w Home Health Services Barriers to Discharge: Continued Medical Work up  Expected Discharge Plan and Services Expected Discharge Plan: Home w Home Health Services In-house Referral: Clinical Social Work Discharge Planning Services: CM Consult Post Acute Care Choice: Resumption of Svcs/PTA Provider, Home Health Living arrangements for the past 2 months: Group Home                                       Social Determinants of Health (SDOH) Interventions    Readmission Risk Interventions Readmission Risk Prevention Plan 06/04/2020  Post Dischage Appt Complete  Medication Screening Complete  Transportation Screening Complete  Some recent data might be hidden

## 2020-06-07 NOTE — Progress Notes (Signed)
Patient was off the floor for an abdominal x-ray. She was not seen and examined today. As per her nurse her bowel movements have become less frequent and there is some form to stool. We will follow up tomorrow.

## 2020-06-07 NOTE — Progress Notes (Addendum)
PROGRESS NOTE    Nicole [name]  HRC:[id number] DOB: May 31, 1951 DOA: 05/30/2020 PCP: Elizabeth Palau, [name]   Chief Complaint  Patient presents with  . Diarrhea    Brief Narrative:   69 year old female with history of seizure disorder, stroke with left hemiparesis, hypertension, hyperlipidemia presents with persistent diarrhea and generalized weakness.  Patient reports she had a prolonged hospital stay at [facility] long for small bowel obstruction and was discharged on the 24th June.  And since then she has been having diarrhea.  C. difficile PCR is negative and GI pathogen is negative.  CT of the abdomen and pelvis shows Colonic wall thickening with pericolonic edema and fat stranding involving the distal descending through sigmoid colon consistent with colitis, likely infectious or inflammatory. Pt underwent flex sigmoidoscopy showing ischemic colitis. Rectal tube was removed. She was started on cholestyramine and low fiber diet.  Patient seen and examined today, reports persistent nausea, no vomiting. abd distention slightly worse than yesterday.   Assessment & Plan:   Active Problems:   Hyperlipidemia   Hypertension   Seizures (HCC)   Dehydration   Diarrhea   Hypomagnesemia   Cellulitis   Sepsis (HCC)   Colitis   Ischemic colitis Improving diarrhea, nausea persistent 1 episode of vomiting yesterday.    GI consulted, underwent flex sigmoidoscopy/colonoscopy showing  Altered vascular, congested, friable (with spontaneous bleeding), nodular, plaque covered, ulcerated and vascular-pattern-decreased mucosa in the sigmoid colon and in the descending colon. Biopsied. Overall pattern most consistent with ischemic colitis.. Rectal tube was removed. And she was started on cholestyramine. Abdominal film today shows Abundant stool in the ascending colon with suggestion of haustral thickening particularly in the cecum. Haustral thickening likely reflecting colitis in this patient with known  colitis.  Increase the dose of cholestyramine to 3 times daily . c diff pcr is negative, Gi pathogen pcr is negative.  On exam today, pt had worsening abdomen distention, ordered abdominal film showing translucency over the liver, possibly free air under the diaphragm vs artifact. Ordered CT abdomen and pelvis for further evaluation,. Made her NPO. Discussed with the patient's daughter.      Essential hypertension Well controlled BP parameters.     Seizure Continue with Tegretol 300 mg twice daily and Keppra 500 mg twice daily No seizures this admission.     Hyperlipidemia Continue with the Lipitor 10 mg 3 times a week.    L3-L4 endplate sclerosis with endplate irregularity and erosions. Will need further work-up with an MRI of the lumbar spine as an outpatient     Anemia of acute illness Baseline hemoglobin appears to be around 12. Slow drop in hemoglobin  from 12 to 10.1, 9.6, 8.8 to  10.1 Anemia panel reviewed showed normal ferritin and iron levels. Flex sigmoidoscopy reviewed. No bleeding per rectum.  Pt refusing further blood work.    Hypokalemia Replaced, .   DVT prophylaxis: heparin.  Code Status: DNR Family Communication: discussed with daughter over the phone. Disposition:   Status is: Inpatient  Remains inpatient appropriate because:Unsafe d/c plan   Dispo:  Patient From: Home  Planned Disposition: Group home with home PT and OT.  Expected discharge date: 06/08/20  Medically stable for discharge: No        Consultants:   Gastroenterology.   Procedures: none.  Antimicrobials:  Anti-infectives (From admission, onward)   Start     Dose/Rate Route Frequency Ordered Stop   06/01/20 0000  vancomycin (VANCOCIN) IVPB 1000 mg/200 mL premix  Status:  Discontinued  1,000 mg 200 mL/hr over 60 Minutes Intravenous Every 12 hours 05/31/20 1107 06/01/20 1027   05/31/20 1100  vancomycin (VANCOREADY) IVPB 1750 mg/350 mL        1,750 mg 175 mL/hr  over 120 Minutes Intravenous  Once 05/31/20 1058 05/31/20 1501   05/31/20 0800  piperacillin-tazobactam (ZOSYN) IVPB 3.375 g  Status:  Discontinued        3.375 g 12.5 mL/hr over 240 Minutes Intravenous Every 8 hours 05/31/20 0217 06/03/20 0917   05/31/20 0230  piperacillin-tazobactam (ZOSYN) IVPB 3.375 g        3.375 g 100 mL/hr over 30 Minutes Intravenous  Once 05/31/20 0221 05/31/20 0357   05/31/20 0130  ceFAZolin (ANCEF) IVPB 1 g/50 mL premix  Status:  Discontinued        1 g 100 mL/hr over 30 Minutes Intravenous Every 8 hours 05/31/20 0121 05/31/20 0216   05/31/20 0000  ceFAZolin (ANCEF) IVPB 1 g/50 mL premix        1 g 100 mL/hr over 30 Minutes Intravenous  Once 05/30/20 2345 05/31/20 0056         Subjective: Persistently nauseated, no vomiting, diarrhea improving.   Objective: Vitals:   06/06/20 2035 06/07/20 0230 06/07/20 0436 06/07/20 1435  BP: 123/70 124/69 125/71 135/60  Pulse: 98 97 98 99  Resp: Temp: 98.4 F (36.9 C) 98.1 F (36.7 C) 98.2 F (36.8 C) 98.6 F (37 C)  TempSrc: Oral Oral Oral   SpO2: 96% 96% 97% 94%  Weight:      Height:        Intake/Output Summary (Last 24 hours) at 06/07/2020 1438 Last data filed at 06/07/2020 1000 Gross per 24 hour  Intake 240 ml  Output 2050 ml  Net -1810 ml   Filed Weights   06/04/20 0500 06/04/20 1340 06/05/20 0500  Weight: 94.1 kg 94.1 kg 88.6 kg    Examination:  General exam: Alert and comfortable Respiratory system: Diminished air entry at bases, no tachypnea no wheezing or rhonchi Cardiovascular system: S1-S2 heard, regular rate rhythm, no JVD, no pedal edema Gastrointestinal system: Abdomen is soft, more distended, nontender, bowel sounds normal Central nervous system patient is alert, oriented to person only, confused Extremities: No cyanosis or clubbing Skin: No rashes seen Psychiatry: Mood is appropriate    Data Reviewed: I have personally reviewed following labs and imaging  studies  CBC: Recent Labs  Lab 06/04/20 1241  WBC 4.9  NEUTROABS 3.9  HGB 10.1*  HCT 31.9*  MCV 99.7  PLT 391    Basic Metabolic Panel: Recent Labs  Lab 06/03/20 1210 06/04/20 1241  NA 137 137  K 3.2* 4.0  CL 101 100  CO2 27 29  GLUCOSE 209* 108*  BUN 6* 6*  CREATININE 0.56 0.47  CALCIUM 8.8* 9.5    GFR: Estimated Creatinine Clearance: 74.8 mL/min (by C-G formula based on SCr of 0.47 mg/dL).  Liver Function Tests: No results for input(s): AST, ALT, ALKPHOS, BILITOT, PROT, ALBUMIN in the last 168 hours.  CBG: No results for input(s): GLUCAP in the last 168 hours.   Recent Results (from the past 240 hour(s))  Culture, blood (routine x 2)     Status: None   Collection Time: 05/30/20 11:02 PM   Specimen: BLOOD  Result Value Ref Range Status   Specimen Description BLOOD RIGHT ANTECUBITAL  Final   Special Requests   Final    BOTTLES DRAWN AEROBIC AND ANAEROBIC Blood Culture results may  not be optimal due to an excessive volume of blood received in culture bottles   Culture   Final    NO GROWTH 5 DAYS Performed at [facility], [address], [place], [place] [address]    Report Status 06/04/2020 FINAL  Final  Urine culture     Status: None   Collection Time: 05/30/20 11:08 PM   Specimen: Urine, Random  Result Value Ref Range Status   Specimen Description URINE, RANDOM  Final   Special Requests NONE  Final   Culture   Final    NO GROWTH Performed at [facility], [address] [place], [place] [address]    Report Status 06/01/2020 FINAL  Final  Culture, blood (routine x 2)     Status: None   Collection Time: 05/30/20 11:47 PM   Specimen: BLOOD  Result Value Ref Range Status   Specimen Description BLOOD SITE NOT SPECIFIED  Final   Special Requests   Final    BOTTLES DRAWN AEROBIC ONLY Blood Culture results may not be optimal due to an inadequate volume of blood received in culture bottles   Culture   Final    NO GROWTH 5 DAYS Performed at  [facility], [address] [place], [place] [address]    Report Status 06/05/2020 FINAL  Final  SARS Coronavirus 2 by RT PCR (hospital order, performed in [name] hospital lab) Nasopharyngeal Nasopharyngeal Swab     Status: None   Collection Time: 05/31/20  1:32 AM   Specimen: Nasopharyngeal Swab  Result Value Ref Range Status   SARS Coronavirus 2 NEGATIVE NEGATIVE Final    Comment: (NOTE) SARS-CoV-2 target nucleic acids are NOT DETECTED.  The SARS-CoV-2 RNA is generally detectable in upper and lower respiratory specimens during the acute phase of infection. The lowest concentration of SARS-CoV-2 viral copies this assay can detect is 250 copies / mL. A negative result does not preclude SARS-CoV-2 infection and should not be used as the sole basis for treatment or other patient management decisions.  A negative result may occur with improper specimen collection / handling, submission of specimen other than nasopharyngeal swab, presence of viral mutation(s) within the areas targeted by this assay, and inadequate number of viral copies (<250 copies / mL). A negative result must be combined with clinical observations, patient history, and epidemiological information.  Fact Sheet for Patients:   BoilerBrush.com.cy  Fact Sheet for Healthcare Providers: https://pope.com/  This test is not yet approved or  cleared by the [place] FDA and has been authorized for detection and/or diagnosis of SARS-CoV-2 by FDA under an Emergency Use Authorization (EUA).  This EUA will remain in effect (meaning this test can be used) for the duration of the COVID-19 declaration under Section 564(b)(1) of the Act, 21 U.S.C. section 360bbb-3(b)(1), unless the authorization is terminated or revoked sooner.  Performed at [facility], [address], [place], [place] [address]   MRSA PCR Screening     Status: Abnormal   Collection Time:  05/31/20  6:15 AM   Specimen: Nasal Mucosa; Nasopharyngeal  Result Value Ref Range Status   MRSA by PCR POSITIVE (A) NEGATIVE Final    Comment:        The GeneXpert MRSA Assay (FDA approved for NASAL specimens only), is one component of a comprehensive MRSA colonization surveillance program. It is not intended to diagnose MRSA infection nor to guide or monitor treatment for MRSA infections. RESULT CALLED TO, READ  BACK BY AND VERIFIED WITH: Truitt Leep RN 9:00 05/31/20 ([email or web address]) Performed at [facility], [address], [place], [place] [address]   MRSA PCR Screening     Status: Abnormal   Collection Time: 05/31/20 11:46 AM   Specimen: Nasopharyngeal  Result Value Ref Range Status   MRSA by PCR POSITIVE (A) NEGATIVE Final    Comment:        The GeneXpert MRSA Assay (FDA approved for NASAL specimens only), is one component of a comprehensive MRSA colonization surveillance program. It is not intended to diagnose MRSA infection nor to guide or monitor treatment for MRSA infections. CRITICAL VALUE NOTED.  VALUE IS CONSISTENT WITH PREVIOUSLY REPORTED AND CALLED VALUE. Performed at [facility], [address], [place], [place] [address]   Gastrointestinal Panel by PCR , Stool     Status: None   Collection Time: 06/01/20 11:45 AM   Specimen: Stool  Result Value Ref Range Status   Campylobacter species NOT DETECTED NOT DETECTED Final   Plesimonas shigelloides NOT DETECTED NOT DETECTED Final   Salmonella species NOT DETECTED NOT DETECTED Final   Yersinia enterocolitica NOT DETECTED NOT DETECTED Final   Vibrio species NOT DETECTED NOT DETECTED Final   Vibrio cholerae NOT DETECTED NOT DETECTED Final   Enteroaggregative E coli (EAEC) NOT DETECTED NOT DETECTED Final   Enteropathogenic E coli (EPEC) NOT DETECTED NOT DETECTED Final   Enterotoxigenic E coli (ETEC) NOT DETECTED NOT DETECTED Final   Shiga like toxin producing E coli (STEC) NOT DETECTED NOT DETECTED  Final   Shigella/Enteroinvasive E coli (EIEC) NOT DETECTED NOT DETECTED Final   Cryptosporidium NOT DETECTED NOT DETECTED Final   Cyclospora cayetanensis NOT DETECTED NOT DETECTED Final   Entamoeba histolytica NOT DETECTED NOT DETECTED Final   Giardia lamblia NOT DETECTED NOT DETECTED Final   Adenovirus F40/41 NOT DETECTED NOT DETECTED Final   Astrovirus NOT DETECTED NOT DETECTED Final   Norovirus GI/GII NOT DETECTED NOT DETECTED Final   Rotavirus A NOT DETECTED NOT DETECTED Final   Sapovirus (I, II, IV, and V) NOT DETECTED NOT DETECTED Final    Comment: Performed at [facility], [address] [place], [place] [address]  C Difficile Quick Screen w PCR reflex     Status: None   Collection Time: 06/01/20 11:45 AM   Specimen: STOOL  Result Value Ref Range Status   C Diff antigen NEGATIVE NEGATIVE Final   C Diff toxin NEGATIVE NEGATIVE Final   C Diff interpretation No C. difficile detected.  Final    Comment: Performed at [facility], [address] [place], [place] [address]         Radiology Studies: DG Abd 1 View  Result Date: 06/06/2020 CLINICAL DATA:  Nausea vomiting. EXAM: ABDOMEN - 1 VIEW COMPARISON:  05/31/2020 CT evaluation and prior plain film evaluation FINDINGS: Abundant stool is present in the ascending colon. Some suggestion of haustral thickening particularly in the cecum. Pattern of distension is similar to previous imaging. Spinal degenerative changes with similar appearance to prior studies greatest in the mid lumbar spine. IMPRESSION: Abundant stool in the ascending colon with suggestion of haustral thickening particularly in the cecum. Haustral thickening likely reflecting colitis in this patient with known colitis. No change in colonic distension compared to recent imaging. Electronically Signed   By: Donzetta Kohut M.D.   On: 06/06/2020 13:24   DG Abd 2 Views  Result Date: 06/07/2020 CLINICAL DATA:  Vomiting EXAM: ABDOMEN -  2 VIEW COMPARISON:   Prior abdominal radiograph from 06/06/2020 FINDINGS: Colonic distension with stool in the ascending colon. Relatively under distended distal colon. Increasing gastric distension. On the upright or semi upright radiograph there is more lucency over the liver than expected, potentially related artifact though of uncertain significance. LEFT lower lobe airspace disease in the retrocardiac region. Marked spinal degenerative changes. IMPRESSION: 1. Increasing gastric distension. 2. Colonic distension with stool in the ascending colon. 3. Lucency over the liver on the upright or upright radiograph, potentially related artifact though cannot exclude pneumoperitoneum. CT may be helpful for further assessment 4. LEFT lower lobe airspace disease. These results were called by telephone at the time of interpretation on 06/07/2020 at 1:54 pm to provider Kathlen ModyVIJAYA Fran Mcree , who verbally acknowledged these results. Electronically Signed   By: Donzetta KohutGeoffrey  Wile M.D.   On: 06/07/2020 13:55        Scheduled Meds: . atorvastatin  10 mg Oral Q M,W,F  . budesonide  0.25 mg Inhalation BID  . calcium carbonate  1 tablet Oral TID WC  . carbamazepine  300 mg Oral BID  . Chlorhexidine Gluconate Cloth  6 each Topical Daily  . cholestyramine  4 g Oral TID  . feeding supplement  1 Container Oral TID BM  . gabapentin  300 mg Oral TID  . heparin  5,000 Units Subcutaneous Q8H  . levETIRAcetam  500 mg Oral BID  . mouth rinse  15 mL Mouth Rinse BID  . multivitamin with minerals  1 tablet Oral Daily  . mupirocin ointment   Nasal BID  . potassium chloride  40 mEq Oral Daily  . sodium chloride flush  3 mL Intravenous Q12H   Continuous Infusions: . sodium chloride 10 mL/hr at 06/03/20 0800  . sodium chloride Stopped (05/31/20 1259)     LOS: 7 days        Kathlen ModyVijaya Kiandra Sanguinetti, MD [facility]   To contact the attending provider between 7A-7P or the covering provider during after hours 7P-7A, please log into the web site  [email or web address] and access using universal [place] password for that web site. If you do not have the password, please call the hospital operator.  06/07/2020, 2:38 PM

## 2020-06-07 NOTE — Progress Notes (Signed)
Addendum: Late entry.   Notified by Radiologist on call that patient has Thrombus in the right lower lobe of the lung, No free air in the abdomen.  Discussed the details of the CT abd and pelvis with the patient's HCPOA and started her on IV heparin which was later changed to lovenox as pt is refusing blood draws.    In view of multiple medical problems, pt refusal to take heparin shots for DVT prophylaxis, refusing blood draws, refusing to eat, deconditioning, after extensive discussion with HCPOA, palliative care consulted for goals of care.   Meanwhile, further work up with venous duplex of the lower extremities ordered to evaluate for DVT, . MRI of the lumbar spine with and without contrast ordered to evaluate the L3L4 irregularity to rule out discitis after discussing with the family.    Cecily Lawhorne,MD

## 2020-06-08 ENCOUNTER — Inpatient Hospital Stay (HOSPITAL_COMMUNITY): Payer: Medicare Other

## 2020-06-08 DIAGNOSIS — R609 Edema, unspecified: Secondary | ICD-10-CM

## 2020-06-08 DIAGNOSIS — M4640 Discitis, unspecified, site unspecified: Secondary | ICD-10-CM | POA: Clinically undetermined

## 2020-06-08 DIAGNOSIS — I2699 Other pulmonary embolism without acute cor pulmonale: Secondary | ICD-10-CM

## 2020-06-08 DIAGNOSIS — M4646 Discitis, unspecified, lumbar region: Secondary | ICD-10-CM

## 2020-06-08 LAB — CBC
HCT: 29.7 % — ABNORMAL LOW (ref 36.0–46.0)
Hemoglobin: 9.5 g/dL — ABNORMAL LOW (ref 12.0–15.0)
MCH: 31.5 pg (ref 26.0–34.0)
MCHC: 32 g/dL (ref 30.0–36.0)
MCV: 98.3 fL (ref 80.0–100.0)
Platelets: 355 10*3/uL (ref 150–400)
RBC: 3.02 MIL/uL — ABNORMAL LOW (ref 3.87–5.11)
RDW: 13.4 % (ref 11.5–15.5)
WBC: 5 10*3/uL (ref 4.0–10.5)
nRBC: 0 % (ref 0.0–0.2)

## 2020-06-08 LAB — SURGICAL PATHOLOGY

## 2020-06-08 MED ORDER — ENOXAPARIN SODIUM 100 MG/ML ~~LOC~~ SOLN
90.0000 mg | Freq: Two times a day (BID) | SUBCUTANEOUS | Status: DC
  Administered 2020-06-08 – 2020-06-10 (×5): 90 mg via SUBCUTANEOUS
  Filled 2020-06-08 (×6): qty 0.9

## 2020-06-08 MED ORDER — PRO-STAT SUGAR FREE PO LIQD
30.0000 mL | Freq: Three times a day (TID) | ORAL | Status: DC
  Administered 2020-06-08 – 2020-06-15 (×15): 30 mL via ORAL
  Filled 2020-06-08 (×25): qty 30

## 2020-06-08 NOTE — Consult Note (Signed)
[facility]    Date of Admission:  05/30/2020     Total days of antibiotics 4               Reason for Consult: Discitis    Referring Provider: Blake Divine Primary Care Provider: Elizabeth Palau, [name]   ASSESSMENT:  Nicole [name] is a 69 y/o female admitted with diarrhea and weakness found to have ischemic colitis and subsequently found to have right lower lobe lung thrombus and suspicious findings for L3-L4 discitis/osteomyelitis. Agree with holding antibiotics at this time with IR consult for aspiration as she is clinically stable which will help to narrow antibiotic therapy. Discussed plan of care to includes 6 weeks of antibiotics with route to be determined depending upon findings and disposition.    PLAN:  1. Agree with IR aspiration if able to obtain cultures. 2. Hold antibiotics until after aspiration and recommend starting broad spectrum coverage with vancomycin and ceftriaxone.  3. Diarrhea management per GI  4. Thrombus management per primary team.    Principal Problem:   Discitis Active Problems:   Hyperlipidemia   Hypertension   Seizures (HCC)   Dehydration   Diarrhea   Hypomagnesemia   Cellulitis   Sepsis (HCC)   Colitis   Pressure injury of skin   . atorvastatin  10 mg Oral Q M,W,F  . budesonide  0.25 mg Inhalation BID  . calcium carbonate  1 tablet Oral TID WC  . carbamazepine  300 mg Oral BID  . Chlorhexidine Gluconate Cloth  6 each Topical Daily  . cholestyramine  4 g Oral TID  . enoxaparin (LOVENOX) injection  90 mg Subcutaneous Q12H  . feeding supplement  1 Container Oral TID BM  . feeding supplement (PRO-STAT SUGAR FREE 64)  30 mL Oral TID  . gabapentin  300 mg Oral TID  . levETIRAcetam  500 mg Oral BID  . mouth rinse  15 mL Mouth Rinse BID  . multivitamin with minerals  1 tablet Oral Daily  . mupirocin ointment   Nasal BID  . potassium chloride  40 mEq Oral Daily  . sodium chloride flush  3 mL Intravenous Q12H      HPI: Nicole [name] is a 69 y.o. 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   Nicole [name] was recently hospitalized from 6/14-6/24 with npn-infectious diarrhea and small bowel obstruction vs ilieus. Treated medically and toelrated diet prior to discharge. C. Diff and GI pathogen panel were negative.  Nicole [name] has had continued diarrhea over the past 3 weeks. Stool testing once again negative for C. Diff with a negative GI pathogen panel. GI performed sigmoidoscopy with biopsied obtained with changes of ischemic colitis noted. Cholestyramine has been titrated and diarrhea has improved. CT abdomen performed with concern for abdominal distention and possible bowel obstruction. A thrombus was noted in the right lower lung pulmonary artery and branches. There was also concern for the findings of possible discitis. Follow up MRI lumbar spine with enhancement along L3 and L4 disc margins concerning for osteomyelitis/discitis.   Nicole [name] has been afebrile since admission with no significant leukocytosis. Previously received 4 days of antibiotic therapy with pip/tazo and vancomycin. Blood cultures from admission were finalized without growth. Nicole [name] is Nicole [name]'s legal guardian and provides most of the history. Nicole [name] currently lives in a group home prior  to her recent hospitalization. She was in her normal state of health until the diarrhea started several weeks ago. Unsure of any symptoms although has complained of left hip pain at times which improved with previous antibiotics.   Review of Systems: Review of Systems  Unable to perform ROS: Mental acuity     Past Medical History:  Diagnosis Date  . 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 Allergic rhinitis   . Alopecia   . Discoid lupus   . Hyperlipidemia   .  Hypertension   . Menopause   . Moderate mental retardation   . Obesity   . Osteoporosis   . Pulmonary nodules   . Seizures (HCC)    focal-being by Dr. Sandria Manly (should not take anti-histamines)    Social History   Tobacco Use  . Smoking status: Never Smoker  . Smokeless tobacco: Never Used  Substance Use Topics  . Alcohol use: No  . Drug use: No    Family History  Problem Relation Age of Onset  . Breast cancer Mother     Allergies  Allergen Reactions  . Seasonal Ic [Cholestatin]     Runny nose    OBJECTIVE: Blood pressure 132/69, pulse 88, temperature 98.6 F (37 C), resp. rate 18, height 5\' 7"  (1.702 m), weight 88.6 kg, SpO2 99 %.  Physical Exam Constitutional:      General: She is not in acute distress.    Appearance: She is well-developed.  Cardiovascular:     Rate and Rhythm: Normal rate and regular rhythm.     Heart sounds: Normal heart sounds.  Pulmonary:     Effort: Pulmonary effort is normal.     Breath sounds: Normal breath sounds.  Skin:    General: Skin is warm and dry.  Neurological:     Mental Status: She is alert.     Lab Results Lab Results  Component Value Date   WBC 5.0 06/08/2020   HGB 9.5 (L) 06/08/2020   HCT 29.7 (L) 06/08/2020   MCV 98.3 06/08/2020   PLT 355 06/08/2020    Lab Results  Component Value Date   CREATININE 0.47 06/04/2020   BUN 6 (L) 06/04/2020   NA 137 06/04/2020   K 4.0 06/04/2020   CL 100 06/04/2020   CO2 29 06/04/2020    Lab Results  Component Value Date   ALT 21 05/31/2020   AST 29 05/31/2020   ALKPHOS 34 (L) 05/31/2020   BILITOT 0.5 05/31/2020     Microbiology: Recent Results (from the past 240 hour(s))  Culture, blood (routine x 2)     Status: None   Collection Time: 05/30/20 11:02 PM   Specimen: BLOOD  Result Value Ref Range Status   Specimen Description BLOOD RIGHT ANTECUBITAL  Final   Special Requests   Final    BOTTLES DRAWN AEROBIC AND ANAEROBIC Blood Culture results may not be optimal due  to an excessive volume of blood received in culture bottles   Culture   Final    NO GROWTH 5 DAYS Performed at [facility], [address], [place], [place]    Report Status 06/04/2020 FINAL  Final  Urine culture     Status: None   Collection Time: 05/30/20 11:08 PM   Specimen: Urine, Random  Result Value Ref Range Status   Specimen Description URINE, RANDOM  Final   Special Requests NONE  Final   Culture   Final  NO GROWTH Performed at [facility], [address], [place], [place] [address]    Report Status 06/01/2020 FINAL  Final  Culture, blood (routine x 2)     Status: None   Collection Time: 05/30/20 11:47 PM   Specimen: BLOOD  Result Value Ref Range Status   Specimen Description BLOOD SITE NOT SPECIFIED  Final   Special Requests   Final    BOTTLES DRAWN AEROBIC ONLY Blood Culture results may not be optimal due to an inadequate volume of blood received in culture bottles   Culture   Final    NO GROWTH 5 DAYS Performed at [facility], [address] [place], [place] [address]    Report Status 06/05/2020 FINAL  Final  SARS Coronavirus 2 by RT PCR (hospital order, performed in [facility] hospital lab) Nasopharyngeal Nasopharyngeal Swab     Status: None   Collection Time: 05/31/20  1:32 AM   Specimen: Nasopharyngeal Swab  Result Value Ref Range Status   SARS Coronavirus 2 NEGATIVE NEGATIVE Final    Comment: (NOTE) SARS-CoV-2 target nucleic acids are NOT DETECTED.  The SARS-CoV-2 RNA is generally detectable in upper and lower respiratory specimens during the acute phase of infection. The lowest concentration of SARS-CoV-2 viral copies this assay can detect is 250 copies / mL. A negative result does not preclude SARS-CoV-2 infection and should not be used as the sole basis for treatment or other patient management decisions.  A negative result may occur with improper specimen collection / handling, submission of specimen other than  nasopharyngeal swab, presence of viral mutation(s) within the areas targeted by this assay, and inadequate number of viral copies (<250 copies / mL). A negative result must be combined with clinical observations, patient history, and epidemiological information.  Fact Sheet for Patients:   BoilerBrush.com.cy  Fact Sheet for Healthcare Providers: https://pope.com/  This test is not yet approved or  cleared by the [place] FDA and has been authorized for detection and/or diagnosis of SARS-CoV-2 by FDA under an Emergency Use Authorization (EUA).  This EUA will remain in effect (meaning this test can be used) for the duration of the COVID-19 declaration under Section 564(b)(1) of the Act, 21 U.S.C. section 360bbb-3(b)(1), unless the authorization is terminated or revoked sooner.  Performed at [facility], [address] [place], [place] [address]   MRSA PCR Screening     Status: Abnormal   Collection Time: 05/31/20  6:15 AM   Specimen: Nasal Mucosa; Nasopharyngeal  Result Value Ref Range Status   MRSA by PCR POSITIVE (A) NEGATIVE Final    Comment:        The GeneXpert MRSA Assay (FDA approved for NASAL specimens only), is one component of a comprehensive MRSA colonization surveillance program. It is not intended to diagnose MRSA infection nor to guide or monitor treatment for MRSA infections. RESULT CALLED TO, READ BACK BY AND VERIFIED WITH: Truitt Leep RN 9:00 05/31/20 ([email or web address]) Performed at [facility], [address], [place], [place] [address]   MRSA PCR Screening     Status: Abnormal   Collection Time: 05/31/20 11:46 AM   Specimen: Nasopharyngeal  Result Value Ref Range Status   MRSA by PCR POSITIVE (A) NEGATIVE Final    Comment:        The GeneXpert MRSA Assay (FDA approved for NASAL specimens only), is one component of a comprehensive MRSA colonization surveillance program. It is  not intended to diagnose MRSA  infection nor to guide or monitor treatment for MRSA infections. CRITICAL VALUE NOTED.  VALUE IS CONSISTENT WITH PREVIOUSLY REPORTED AND CALLED VALUE. Performed at [facility], [address] [place], [place] [address]   Gastrointestinal Panel by PCR , Stool     Status: None   Collection Time: 06/01/20 11:45 AM   Specimen: Stool  Result Value Ref Range Status   Campylobacter species NOT DETECTED NOT DETECTED Final   Plesimonas shigelloides NOT DETECTED NOT DETECTED Final   Salmonella species NOT DETECTED NOT DETECTED Final   Yersinia enterocolitica NOT DETECTED NOT DETECTED Final   Vibrio species NOT DETECTED NOT DETECTED Final   Vibrio cholerae NOT DETECTED NOT DETECTED Final   Enteroaggregative E coli (EAEC) NOT DETECTED NOT DETECTED Final   Enteropathogenic E coli (EPEC) NOT DETECTED NOT DETECTED Final   Enterotoxigenic E coli (ETEC) NOT DETECTED NOT DETECTED Final   Shiga like toxin producing E coli (STEC) NOT DETECTED NOT DETECTED Final   Shigella/Enteroinvasive E coli (EIEC) NOT DETECTED NOT DETECTED Final   Cryptosporidium NOT DETECTED NOT DETECTED Final   Cyclospora cayetanensis NOT DETECTED NOT DETECTED Final   Entamoeba histolytica NOT DETECTED NOT DETECTED Final   Giardia lamblia NOT DETECTED NOT DETECTED Final   Adenovirus F40/41 NOT DETECTED NOT DETECTED Final   Astrovirus NOT DETECTED NOT DETECTED Final   Norovirus GI/GII NOT DETECTED NOT DETECTED Final   Rotavirus A NOT DETECTED NOT DETECTED Final   Sapovirus (I, II, IV, and V) NOT DETECTED NOT DETECTED Final    Comment: Performed at [facility], [address] [place], [place] [address]  C Difficile Quick Screen w PCR reflex     Status: None   Collection Time: 06/01/20 11:45 AM   Specimen: STOOL  Result Value Ref Range Status   C Diff antigen NEGATIVE NEGATIVE Final   C Diff toxin NEGATIVE NEGATIVE Final   C Diff interpretation No C. difficile detected.  Final     Comment: Performed at [facility], [address] [place], [place] [address]     Marcos EkeGreg Shaun Runyon, NP [facility]  06/08/2020  3:33 PM

## 2020-06-08 NOTE — Progress Notes (Signed)
[facility] Gastroenterology Progress Note  Nicole [name] 69 y.o. [birth year]   Subjective: Feels ok. Denies further diarrhea though chart reports large amount of loose stool this morning. Denies abdominal pain.  Objective: Vital signs: Vitals:   06/07/20 2042 06/08/20 0647  BP: 132/64 123/70  Pulse: 90 81  Resp: 18 18  Temp: 98.5 F (36.9 C) 98.6 F (37 C)  SpO2: 94% 95%    Physical Exam: Gen: lethargic, no acute distress  HEENT: anicteric sclera CV: RRR Chest: CTA B Abd: LLQ tenderness with guarding, soft, nondistended, +BS Ext: +LE edema  Lab Results: No results for input(s): NA, K, CL, CO2, GLUCOSE, BUN, CREATININE, CALCIUM, MG, PHOS in the last 72 hours. No results for input(s): AST, ALT, ALKPHOS, BILITOT, PROT, ALBUMIN in the last 72 hours. Recent Labs    06/08/20 0721  WBC 5.0  HGB 9.5*  HCT 29.7*  MCV 98.3  PLT 355      Assessment/Plan: Ischemic colitis - supportive care. CT shows persistent LLQ colitis. Thrombus in right lung and other findings per report. From a GI standpoint continue Cholestyramine and supportive care. No new recs. Pathology pending.  Shirley Friar 06/08/2020, 10:33 AM  Questions please call [phone] ID: Nicole [name], female   DOB: 04/09/1951, 69 y.o.   MRN: [id number]

## 2020-06-08 NOTE — Progress Notes (Signed)
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  She was recently admitted with a small bowel obstruction and diarrhea, discharged on 6/24.  She continued to have diarrhea after discharge, poor p.o. intake and was readmitted on 6/27 with shock, confusion.     PT Comments    Pt somewhat resistant to mobilization, needs encouragement, responds well to her sister. Tot A +2 for rolling for bowel clean up. Tot A +2 for supine to sit, pt expressed R knee discomfort with flexion. Attempted transfers with arm in arm tot A as well as use of stedy but pt not participating in pushing through LE's, expressed B foot tenderness with any WB'ing. Pt with wounds on B feet. Pt became very fatigued after sitting EOB >10 mins and sit>stand attempt and began saying "no" to all further mobility. Return to supine with +2 tot A and bed placed in chair position. PT will continue to follow.    Follow Up Recommendations  SNF     Equipment Recommendations  None recommended by PT    Recommendations for Other Services OT consult     Precautions / Restrictions Precautions Precautions: Fall Restrictions Weight Bearing Restrictions: No    Mobility  Bed Mobility Overal bed mobility: Needs Assistance Bed Mobility: Supine to Sit;Sit to Supine Rolling: Total assist;+2 for physical assistance   Supine to sit: Total assist;+2 for physical assistance Sit to supine: +2 for physical assistance;Total assist   General bed mobility comments: total A to roll to each side for bowel cleanup and tot A +2 for SL to sit, at some points pt resisting assist from therapist because she did not really want to sit up. Pt c/o R knee pain with flexion  Transfers Overall transfer level: Needs assistance    Transfers: Sit to/from Stand Sit to Stand: Total assist;+2 physical assistance         General transfer comment: attempted sit>stand 3x with tot A +2, pt not assisting with push up with arm in arm assist or when attempted to use stedy. Pt's sister very encouraging but pt began to say "no" and started leaning bkwds in sitting after attempts. Pt c/o B foot tenderness with WB'ing  Ambulation/Gait             General Gait Details: unable   Stairs             Wheelchair Mobility    Modified Rankin (Stroke Patients Only)       Balance Overall balance assessment: Needs assistance Sitting-balance support: Feet supported;Bilateral upper extremity supported;Single extremity supported Sitting balance-Leahy Scale: Poor Sitting balance - Comments: maintains slumped posture unless trunk extension manually facilitated. 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 with L sided  weakness      Exercises General Exercises - Lower Extremity Ankle Circles/Pumps: Supine;AROM;10 reps;Both Short Arc Quad: Supine;Right;10 reps;Left Heel Slides: PROM;Right;5 reps;AROM;Left    General Comments        Pertinent Vitals/Pain Pain Assessment: Faces Faces Pain Scale: Hurts even more Pain Location: R knee, feet Pain Descriptors / Indicators: Grimacing;Sharp;Moaning;Guarding Pain Intervention(s): Limited activity within patient's tolerance;Monitored during session    Home Living                      Prior Function            PT Goals (current goals can now be found in the care  plan section) Acute Rehab PT Goals Patient Stated Goal: none stated PT Goal Formulation: With patient/family Time For Goal Achievement: 06/16/20 Potential to Achieve Goals: Fair Progress towards PT goals: Not progressing toward goals - comment (fatigue)    Frequency    Min 2X/week      PT Plan Current plan remains appropriate    Co-evaluation              AM-PAC PT "6 Clicks" Mobility   Outcome Measure  Help needed turning from your back to your side while in a flat bed without using bedrails?: Total Help needed moving from lying on your back to sitting on the side of a flat bed without using bedrails?: Total Help needed moving to and from a bed to a chair (including a wheelchair)?: Total Help needed standing up from a chair using your arms (e.g., wheelchair or bedside chair)?: Total Help needed to walk in hospital room?: Total Help needed climbing 3-5 steps with a railing? : Total 6 Click Score: 6    End of Session Equipment Utilized During Treatment: Gait belt Activity Tolerance: Patient limited by fatigue (anxious) Patient left: with call bell/phone within reach;with family/visitor present;in bed;Other (comment) (with bed in chair position) Nurse Communication: Mobility status PT Visit Diagnosis: Other symptoms and signs involving the nervous system (R29.898);Other abnormalities of gait and mobility (R26.89)     Time: 0814-4818 PT Time Calculation (min) (ACUTE ONLY): 37 min  Charges:  $Therapeutic Activity: 23-37 mins                     [name], PT  Acute Rehab Services  Pager [phone] Office [phone]    [name] 06/08/2020, 1:31 PM

## 2020-06-08 NOTE — Progress Notes (Addendum)
Nutrition Follow-up  DOCUMENTATION CODES:   Not applicable  INTERVENTION:   -Continue Boost Breeze po TID, each supplement provides 250 kcal and 9 grams of protein -MVI with minerals daily -30 ml Prostat TID, each supplement provides 100 kcals and 15 grams protein  NUTRITION DIAGNOSIS:   Increased nutrient needs related to acute illness as evidenced by estimated needs.  Ongoing  GOAL:   Patient will meet greater than or equal to 90% of their needs  Progressing   MONITOR:   PO intake, Supplement acceptance, Diet advancement, Labs, Weight trends, Skin, I & O's  REASON FOR ASSESSMENT:   Consult Malnutrition Eval  ASSESSMENT:   69 year old female who presented from Group Home on 6/27 with diarrhea. 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 Recent admission 6/11-6/24 for diarrhea and hypotension and was found to have SBO vs ileus which resolved with NG tube. Admitted for severe sepsis secondary to acute colitis with RLE cellulitis.  6/30 - diet advanced to GI soft 7/2- s/p flex sig- revealed hemorrhoids, small rectal prolapse, mucosa in sigmoid colon (biopsied); rectal tube d/c 7/5- s/p CT of abdomen and pelvis revealed thrombus in rt lower lobe of lung, no free air in abdomen  Reviewed I/O's:-880 ml x 24 hours and -4 L since admission  UOP: 1 L x 24 hours  Attempted to speak with pt via phone, however, no answer.   Palliative care has been consulted for goals of care. Per chart review, pt has been refusing lab draws and care (per MD notes, has refused heparin as well). Intake has decreased over the past 3 days noted 0-5%. Pt is accepting Boost Breeze supplements per MAR.   Per TOC notes, group home will accept pt back with home health services once medically stable.   Labs reviewed.   Diet Order:   Diet Order            DIET SOFT Room service appropriate? Yes; Fluid consistency: Thin  Diet  effective now                 EDUCATION NEEDS:   No education needs have been identified at this time  Skin:  Skin Assessment: Skin Integrity Issues: Skin Integrity Issues:: Stage I, Unstageable, Other (Comment) Stage I: rt heel Unstageable: lt heel Other: MASD to coccyx  Last BM:  06/08/20  Height:   Ht Readings from Last 1 Encounters:  06/04/20 5\' 7"  (1.702 m)    Weight:   Wt Readings from Last 1 Encounters:  06/05/20 88.6 kg    Ideal Body Weight:  61.4 kg  BMI:  Body mass index is 30.59 kg/m.  Estimated Nutritional Needs:   Kcal:  1950-2150  Protein:  95-110 grams  Fluid:  > 1.9 L    08/06/20, RD, LDN, CDCES Registered Dietitian II Certified Diabetes Care and Education Specialist Please refer to [facility] for RD and/or RD on-call/weekend/after hours pager

## 2020-06-08 NOTE — Progress Notes (Signed)
Bilateral lower extremity venous duplex complete.  Critical findings reported to Dr. Blake Divine @ 17:20. Please see CV proc tab for preliminary results. Levin Bacon- RDMS, RVT 5:23 PM  06/08/2020

## 2020-06-08 NOTE — Consult Note (Signed)
WOC Nurse Consult Note: Patient receiving care in [facility] [phone].  Patient's sister at bedside during my assessment, as well as the primary RN. Reason for Consult: "pressure injury sacral area" Wound type: NO pressure injury present to sacrum.  Patient has some early fungal rash around anus and inner/upper thighs.  She also has a DTPI on the left heel. Pressure Injury POA: No Measurement: left heel DTPI measures 2 cm x 4 cm. Wound bed: dry brown Drainage (amount, consistency, odor) none Periwound: intact Dressing procedure/placement/frequency: Place Criticaid clear (purple and white tube) around anus and inner/upper thighs. Then sprinkle a heavy layer of antifungal powder (green and white bottle in clean utility) over these same areas. Iodine swabsticks/swab pads to left heel each shift, allow to air dry; place foot back into Prevalon boot. Monitor the wound area(s) for worsening of condition such as: Signs/symptoms of infection,  Increase in size,  Development of or worsening of odor, Development of pain, or increased pain at the affected locations.  Notify the medical team if any of these develop.  Helmut Muster, RN, MSN, CWOCN, CNS-BC, pager [phone]

## 2020-06-08 NOTE — Progress Notes (Signed)
ANTICOAGULATION CONSULT NOTE - Initial Consult  Pharmacy Consult for IV Heparin>>Lovenox Indication: pulmonary embolus  Allergies  Allergen Reactions   Seasonal Ic [Cholestatin]     Runny nose    Patient Measurements: Height: 5\' 7"  (170.2 cm) Weight: 88.6 kg (195 lb 5.2 oz) IBW/kg (Calculated) : 61.6 Heparin Dosing Weight: 82.1 kg  Vital Signs: Temp: 98.6 F (37 C) (07/06 0647) Temp Source: Oral (07/06 0647) BP: 123/70 (07/06 0647) Pulse Rate: 81 (07/06 0647)  Labs: No results for input(s): HGB, HCT, PLT, APTT, LABPROT, INR, HEPARINUNFRC, HEPRLOWMOCWT, CREATININE, CKTOTAL, CKMB, TROPONINIHS in the last 72 hours.  Estimated Creatinine Clearance: 74.8 mL/min (by C-G formula based on SCr of 0.47 mg/dL).   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 03-25-1994 (should not take anti-histamines)    Assessment: 69 yr old female admitted on 05/30/20 with persistent diarrhea/generalized weakness, diagnosed with ischemic colitis. Pt underwent flex sig/colonoscopy on 06/04/20 showing altered vascular, congested, friable (with spontaneous bleeding), nodular, plaque covered, ulcerated and vascular-pattern-decreased mucosa in the sigmoid colon and in the descending colon.   Today, CT of abdomen and chest revealed thrombus in RLL pulmonary artery and branches. Vascular U/S on 6/29 revealed no evidence of DVT in lower extremities. Pharmacy is consulted to dose heparin for PE. Per med rec, pt was not on anticoagulation PTA.   Pt has been receiving heparin 5000 units SQ Q 8 hrs for VTE prophylaxis (last dose given on 06/06/20 at 0632 AM; of note, pt has refused the last 4 doses of SQ heparin).  H/H (last on  06/04/20): 10.1/31.9, platelets 391. No bleeding per rectum.   Provider note (and confirmed with RN) states that pt is refusing blood draws. Discussed with Dr. 10-28-1998, will continue therapeutic lovenox (patient accepted dose last PM) at this time.  Goal of Therapy:  Monitor platelets by anticoagulation protocol: Yes   Plan:  Lovenox 90 mg [place] q12h Monitor CBC as able (if pt consents to blood draw) Monitor for signs/symptoms of bleeding F/U transition to oral anticoagulant  Akyia Borelli A. Blake Divine, PharmD, BCPS, FNKF Clinical Pharmacist [place] Please utilize Amion for appropriate phone number to reach the unit pharmacist [facility])

## 2020-06-08 NOTE — Progress Notes (Addendum)
PROGRESS NOTE    Nicole [name]  VEL:[id number] DOB: November 03, 1951 DOA: 05/30/2020 PCP: Elizabeth Palau, [name]   Chief Complaint  Patient presents with  . Diarrhea    Brief Narrative:   69 year old female with history of seizure disorder, stroke with left hemiparesis, hypertension, hyperlipidemia presents with persistent diarrhea and generalized weakness.  Patient reports she had a prolonged hospital stay at [facility] long for small bowel obstruction and was discharged on the 24th June.  And since then she has been having diarrhea.  C. difficile PCR is negative and GI pathogen is negative.  CT of the abdomen and pelvis shows Colonic wall thickening with pericolonic edema and fat stranding involving the distal descending through sigmoid colon consistent with colitis, likely infectious or inflammatory. Pt underwent flex sigmoidoscopy showing ischemic colitis. Rectal tube was removed. She was started on cholestyramine and low fiber diet.  Repeat abdomen and pelvis on 06/07/2020 shows thrombus in the right lower lobe on the lung, no bowel perforation.  She was started on Lovenox injection and venous duplex of the lower extremities ordered for further evaluation of DVT.  In view of multiple medical problems, pt refusal to take heparin shots for DVT prophylaxis, refusing blood draws, refusing to eat, deconditioning, after extensive discussion with HCPOA, palliative care consulted for goals of care.   Assessment & Plan:   Active Problems:   Hyperlipidemia   Hypertension   Seizures (HCC)   Dehydration   Diarrhea   Hypomagnesemia   Cellulitis   Sepsis (HCC)   Colitis   Pressure injury of skin   Ischemic colitis Improving diarrhea, nausea persistent 1 episode of vomiting yesterday.    GI consulted, underwent flex sigmoidoscopy/colonoscopy showing  Altered vascular, congested, friable (with spontaneous bleeding), nodular, plaque covered, ulcerated and vascular-pattern-decreased mucosa in the sigmoid  colon and in the descending colon. Biopsied. Overall pattern most consistent with ischemic colitis.pathology still pending.  Rectal tube was removed. And she was started on cholestyramine. c diff pcr is negative, Gi pathogen pcr is negative.  Patient continues to have persistent abdominal distention and a repeat CT of the abdomen and pelvis was done did not show any obstruction or worsening colitis but showed  thrombus in the right lower lobe of the lung . Patient is currently on soft diet and able to tolerate without nausea and vomiting and abdominal pain    Essential hypertension Well-controlled blood pressure parameters    Seizure Continue with Tegretol 300 mg twice daily and Keppra 500 mg twice daily No seizures this admission.     Hyperlipidemia Continue with the Lipitor 10 mg 3 times a week.    L3-L4 endplate sclerosis with endplate irregularity and erosions. MRI lumbar spine shows At L3-L4, there is advanced disc space narrowing with fluid signal in the disc space. Prominent endplate irregularity with mild L3 and L4 vertebral body height loss. There is enhancement along the disc margins and adjacent endplates. Findings are suspicious for possible Discitis/osteomyelitis.  IR consulted for aspiration. ID consulted for initiation of IV antibiotics.  Discussed with patient's sister at bedside.    Deep tissue pressure injury of the heels Pressure Injury 06/07/20 Heel Left Unstageable - Full thickness tissue loss in which the base of the injury is covered by slough (yellow, tan, gray, green or brown) and/or eschar (tan, brown or black) in the wound bed. (Active)  06/07/20 0800  Location: Heel  Location Orientation: Left  Staging: Unstageable - Full thickness tissue loss in which the base of the injury  is covered by slough (yellow, tan, gray, green or brown) and/or eschar (tan, brown or black) in the wound bed.  Wound Description (Comments):   Present on Admission:        Pressure Injury 06/07/20 Heel Anterior;Right;Left Stage 1 -  Intact skin with non-blanchable redness of a localized area usually over a bony prominence. (Active)  06/07/20 0800  Location: Heel  Location Orientation: Anterior;Right;Left  Staging: Stage 1 -  Intact skin with non-blanchable redness of a localized area usually over a bony prominence.  Wound Description (Comments):   Present on Admission:   Wound care consulted and recommendations given.   Pulmonary embolus in the right lower lobe of the lung,(incidental finding on the CT of the abdomen pelvis) Started the patient on Lovenox injection as patient is refusing blood draws and would not be ideal to being on IV heparin. Venous duplex of the lower extremities ordered to check for DVT.   Anemia of acute illness Baseline hemoglobin appears to be around 12. Slow drop in hemoglobin  from 12 to 10.1, 9.6, 8.8 to  10.1 Anemia panel reviewed showed normal ferritin and iron levels. Flex sigmoidoscopy reviewed. No bleeding per rectum.  Pt refusing further blood work.    Hypokalemia Replaced, .   DVT prophylaxis: Lovenox Code Status: DNR Family Communication: discussed with daughter at bedside Disposition:   Status is: Inpatient  Remains inpatient appropriate because:Ongoing diagnostic testing needed not appropriate for outpatient work up, Unsafe d/c plan, IV treatments appropriate due to intensity of illness or inability to take PO and Inpatient level of care appropriate due to severity of illness   Dispo:  Patient From: Home  Planned Disposition: SNF  Expected discharge date: 06/12/20  Medically stable for discharge: No        Consultants:   Gastroenterology.   ID  IR  Procedures: none.  Antimicrobials:  Anti-infectives (From admission, onward)   Start     Dose/Rate Route Frequency Ordered Stop   06/01/20 0000  vancomycin (VANCOCIN) IVPB 1000 mg/200 mL premix  Status:  Discontinued        1,000 mg 200  mL/hr over 60 Minutes Intravenous Every 12 hours 05/31/20 1107 06/01/20 1027   05/31/20 1100  vancomycin (VANCOREADY) IVPB 1750 mg/350 mL        1,750 mg 175 mL/hr over 120 Minutes Intravenous  Once 05/31/20 1058 05/31/20 1501   05/31/20 0800  piperacillin-tazobactam (ZOSYN) IVPB 3.375 g  Status:  Discontinued        3.375 g 12.5 mL/hr over 240 Minutes Intravenous Every 8 hours 05/31/20 0217 06/03/20 0917   05/31/20 0230  piperacillin-tazobactam (ZOSYN) IVPB 3.375 g        3.375 g 100 mL/hr over 30 Minutes Intravenous  Once 05/31/20 0221 05/31/20 0357   05/31/20 0130  ceFAZolin (ANCEF) IVPB 1 g/50 mL premix  Status:  Discontinued        1 g 100 mL/hr over 30 Minutes Intravenous Every 8 hours 05/31/20 0121 05/31/20 0216   05/31/20 0000  ceFAZolin (ANCEF) IVPB 1 g/50 mL premix        1 g 100 mL/hr over 30 Minutes Intravenous  Once 05/30/20 2345 05/31/20 0056         Subjective: No Nausea, vomiting and abd pain.   Objective: Vitals:   06/07/20 1435 06/07/20 2042 06/08/20 0647 06/08/20 1339  BP: 135/60 132/64 123/70 132/69  Pulse: 99 90 81 88  Resp: 16 18 18 18   Temp: 98.6 F (37 C) 98.5  F (36.9 C) 98.6 F (37 C) 98.6 F (37 C)  TempSrc:  Oral Oral   SpO2: 94% 94% 95% 99%  Weight:      Height:        Intake/Output Summary (Last 24 hours) at 06/08/2020 1442 Last data filed at 06/08/2020 1300 Gross per 24 hour  Intake 220 ml  Output 500 ml  Net -280 ml   Filed Weights   06/04/20 0500 06/04/20 1340 06/05/20 0500  Weight: 94.1 kg 94.1 kg 88.6 kg    Examination:  General exam: Alert and comfortable. Not in distress.  Respiratory system: clear to ausculation bilaterally, no wheezing or rhonchi.  Cardiovascular system: S1S2, RRR, no JVD, pedal edema Gastrointestinal system:  Abdomen is soft, non tender non distended, bowel sounds good.  Central nervous system patient is alert, oriented to person only, confused Extremities: No cyanosis or clubbing, trace pedal edema    Skin: stage 1 heel DTPI Psychiatry: mood is appropriate.     Data Reviewed: I have personally reviewed following labs and imaging studies  CBC: Recent Labs  Lab 06/04/20 1241 06/08/20 0721  WBC 4.9 5.0  NEUTROABS 3.9  --   HGB 10.1* 9.5*  HCT 31.9* 29.7*  MCV 99.7 98.3  PLT 391 355    Basic Metabolic Panel: Recent Labs  Lab 06/03/20 1210 06/04/20 1241  NA 137 137  K 3.2* 4.0  CL 101 100  CO2 27 29  GLUCOSE 209* 108*  BUN 6* 6*  CREATININE 0.56 0.47  CALCIUM 8.8* 9.5    GFR: Estimated Creatinine Clearance: 74.8 mL/min (by C-G formula based on SCr of 0.47 mg/dL).  Liver Function Tests: No results for input(s): AST, ALT, ALKPHOS, BILITOT, PROT, ALBUMIN in the last 168 hours.  CBG: No results for input(s): GLUCAP in the last 168 hours.   Recent Results (from the past 240 hour(s))  Culture, blood (routine x 2)     Status: None   Collection Time: 05/30/20 11:02 PM   Specimen: BLOOD  Result Value Ref Range Status   Specimen Description BLOOD RIGHT ANTECUBITAL  Final   Special Requests   Final    BOTTLES DRAWN AEROBIC AND ANAEROBIC Blood Culture results may not be optimal due to an excessive volume of blood received in culture bottles   Culture   Final    NO GROWTH 5 DAYS Performed at [facility], [address] [place], [place] [address]    Report Status 06/04/2020 FINAL  Final  Urine culture     Status: None   Collection Time: 05/30/20 11:08 PM   Specimen: Urine, Random  Result Value Ref Range Status   Specimen Description URINE, RANDOM  Final   Special Requests NONE  Final   Culture   Final    NO GROWTH Performed at [facility], [address], [place], [place] [address]    Report Status 06/01/2020 FINAL  Final  Culture, blood (routine x 2)     Status: None   Collection Time: 05/30/20 11:47 PM   Specimen: BLOOD  Result Value Ref Range Status   Specimen Description BLOOD SITE NOT SPECIFIED  Final   Special Requests   Final     BOTTLES DRAWN AEROBIC ONLY Blood Culture results may not be optimal due to an inadequate volume of blood received in culture bottles   Culture   Final    NO GROWTH 5 DAYS Performed at [facility], [address] [place], [place] [address]  Report Status 06/05/2020 FINAL  Final  SARS Coronavirus 2 by RT PCR (hospital order, performed in [facility] hospital lab) Nasopharyngeal Nasopharyngeal Swab     Status: None   Collection Time: 05/31/20  1:32 AM   Specimen: Nasopharyngeal Swab  Result Value Ref Range Status   SARS Coronavirus 2 NEGATIVE NEGATIVE Final    Comment: (NOTE) SARS-CoV-2 target nucleic acids are NOT DETECTED.  The SARS-CoV-2 RNA is generally detectable in upper and lower respiratory specimens during the acute phase of infection. The lowest concentration of SARS-CoV-2 viral copies this assay can detect is 250 copies / mL. A negative result does not preclude SARS-CoV-2 infection and should not be used as the sole basis for treatment or other patient management decisions.  A negative result may occur with improper specimen collection / handling, submission of specimen other than nasopharyngeal swab, presence of viral mutation(s) within the areas targeted by this assay, and inadequate number of viral copies (<250 copies / mL). A negative result must be combined with clinical observations, patient history, and epidemiological information.  Fact Sheet for Patients:   BoilerBrush.com.cy  Fact Sheet for Healthcare Providers: https://pope.com/  This test is not yet approved or  cleared by the [place] FDA and has been authorized for detection and/or diagnosis of SARS-CoV-2 by FDA under an Emergency Use Authorization (EUA).  This EUA will remain in effect (meaning this test can be used) for the duration of the COVID-19 declaration under Section 564(b)(1) of the Act, 21 U.S.C. section 360bbb-3(b)(1), unless the  authorization is terminated or revoked sooner.  Performed at [facility], [address] [place], [place] [address]   MRSA PCR Screening     Status: Abnormal   Collection Time: 05/31/20  6:15 AM   Specimen: Nasal Mucosa; Nasopharyngeal  Result Value Ref Range Status   MRSA by PCR POSITIVE (A) NEGATIVE Final    Comment:        The GeneXpert MRSA Assay (FDA approved for NASAL specimens only), is one component of a comprehensive MRSA colonization surveillance program. It is not intended to diagnose MRSA infection nor to guide or monitor treatment for MRSA infections. RESULT CALLED TO, READ BACK BY AND VERIFIED WITH: Truitt Leep RN 9:00 05/31/20 ([email or web address]) Performed at [facility], [name] [place], [place] [address]   MRSA PCR Screening     Status: Abnormal   Collection Time: 05/31/20 11:46 AM   Specimen: Nasopharyngeal  Result Value Ref Range Status   MRSA by PCR POSITIVE (A) NEGATIVE Final    Comment:        The GeneXpert MRSA Assay (FDA approved for NASAL specimens only), is one component of a comprehensive MRSA colonization surveillance program. It is not intended to diagnose MRSA infection nor to guide or monitor treatment for MRSA infections. CRITICAL VALUE NOTED.  VALUE IS CONSISTENT WITH PREVIOUSLY REPORTED AND CALLED VALUE. Performed at [facility], [address] [place], [place] [address]   Gastrointestinal Panel by PCR , Stool     Status: None   Collection Time: 06/01/20 11:45 AM   Specimen: Stool  Result Value Ref Range Status   Campylobacter species NOT DETECTED NOT DETECTED Final   Plesimonas shigelloides NOT DETECTED NOT DETECTED Final   Salmonella species NOT DETECTED NOT DETECTED Final   Yersinia enterocolitica NOT DETECTED NOT DETECTED Final   Vibrio species NOT DETECTED NOT DETECTED Final   Vibrio cholerae NOT DETECTED NOT DETECTED Final   Enteroaggregative E  coli (EAEC) NOT DETECTED NOT DETECTED Final    Enteropathogenic E coli (EPEC) NOT DETECTED NOT DETECTED Final   Enterotoxigenic E coli (ETEC) NOT DETECTED NOT DETECTED Final   Shiga like toxin producing E coli (STEC) NOT DETECTED NOT DETECTED Final   Shigella/Enteroinvasive E coli (EIEC) NOT DETECTED NOT DETECTED Final   Cryptosporidium NOT DETECTED NOT DETECTED Final   Cyclospora cayetanensis NOT DETECTED NOT DETECTED Final   Entamoeba histolytica NOT DETECTED NOT DETECTED Final   Giardia lamblia NOT DETECTED NOT DETECTED Final   Adenovirus F40/41 NOT DETECTED NOT DETECTED Final   Astrovirus NOT DETECTED NOT DETECTED Final   Norovirus GI/GII NOT DETECTED NOT DETECTED Final   Rotavirus A NOT DETECTED NOT DETECTED Final   Sapovirus (I, II, IV, and V) NOT DETECTED NOT DETECTED Final    Comment: Performed at [facility], [address] [place], [place] [address]  C Difficile Quick Screen w PCR reflex     Status: None   Collection Time: 06/01/20 11:45 AM   Specimen: STOOL  Result Value Ref Range Status   C Diff antigen NEGATIVE NEGATIVE Final   C Diff toxin NEGATIVE NEGATIVE Final   C Diff interpretation No C. difficile detected.  Final    Comment: Performed at [facility], [address], [place], [place] [address]         Radiology Studies: DG Chest 2 View  Result Date: 06/07/2020 CLINICAL DATA:  Follow-up. EXAM: CHEST - 2 VIEW COMPARISON:  06/01/2019 FINDINGS: Lungs are adequately inflated demonstrate subtle hazy prominence of the central bronchovascular markings. Mild hazy density over the left retrocardiac region which may be due to layering effusion/atelectasis. Cardiomediastinal silhouette and remainder of the exam is unchanged. IMPRESSION: Minimal opacification in the left retrocardiac region which may be due to layering effusion/atelectasis. Mild stable prominence of the central bronchovascular markings which may be due to mild vascular congestion. Electronically Signed   By: Elberta Fortis M.D.   On:  06/07/2020 17:19   MR Lumbar Spine W Wo Contrast  Result Date: 06/07/2020 CLINICAL DATA:  Low back pain, no red flags. EXAM: MRI LUMBAR SPINE WITHOUT AND WITH CONTRAST TECHNIQUE: Multiplanar and multiecho pulse sequences of the lumbar spine were obtained without and with intravenous contrast. CONTRAST:  8.107mL GADAVIST GADOBUTROL 1 MMOL/ML IV SOLN COMPARISON:  CT abdomen/pelvis 06/07/2020, CT abdomen/pelvis 05/31/2020 FINDINGS: There is mild motion degradation of multiple sequences. Segmentation: 5 lumbar vertebrae are assumed. The caudal most well-formed intervertebral disc is designated L5-S1. Alignment: Mild L3-L4 right lateral listhesis was better appreciated on CT abdomen/pelvis performed earlier the same day. Vertebrae: Redemonstrated prominent endplate irregularity at L3-L4 with mild L3 and L4 vertebral body height loss. Vertebral body height is otherwise maintained. There is diffuse nonspecific heterogeneous marrow signal. Multilevel degenerative endplate irregularity and predominantly fatty degenerative endplate marrow signal. Conus medullaris and cauda equina: Conus extends to the L1-L2 level. No signal abnormality within the visualized distal spinal cord. No definite abnormal enhancement within the spinal canal. Paraspinal and other soft tissues: No abnormality identified within included portions of the abdomen/retroperitoneum. Redemonstrated nonspecific presacral edema. Disc levels: Multilevel disc degeneration. Most notably there is advanced L3-L4 disc degeneration and moderate disc degeneration at L4-L5 and L5-S1. T12-L1: Facet arthrosis. No significant disc herniation or stenosis. L1-L2: Minimal disc bulge. Mild facet arthrosis. No significant disc herniation or stenosis. L2-L3: Minimal disc bulge with endplate osteophytes. Moderate facet arthrosis. No significant spinal canal stenosis. Mild/moderate left neural foraminal narrowing. L3-L4: Advanced disc space  narrowing with fluid signal in the disc  space. Prominent endplate irregularity. There is enhancement along the disc margins and endplates. Disc bulge with osteophyte ridge. Disc osteophyte ridge is most prominent within the central through left subarticular zones. Advanced facet arthrosis with ligamentum flavum hypertrophy. Severe central and left subarticular stenosis. Moderate right with severe left neural foraminal narrowing. L4-L5: Disc bulge. Superimposed broad-based right center to right foraminal disc protrusion. Osteophyte ridge greatest along the right lateral aspect of the disc space. Advanced facet arthrosis (greater on the right) with ligamentum flavum hypertrophy. Severe right subarticular narrowing with potential to affect the descending right L5 nerve root. Moderate central canal stenosis. Severe right with moderate left neural foraminal narrowing. L5-S1: Disc bulge asymmetric to the right. Associated osteophyte ridge greatest along the right lateral aspect of the disc space. Moderate/advanced facet arthrosis with ligamentum flavum hypertrophy. Moderate right subarticular narrowing with crowding of the descending right S1 nerve root. Central canal patent. Bilateral neural foraminal narrowing (severe right, mild left). Impression #1 will be called to the ordering clinician or representative by the Radiologist Assistant, and communication documented in the PACS or [employer] Dashboard. IMPRESSION: At L3-L4, there is advanced disc space narrowing with fluid signal in the disc space. Prominent endplate irregularity with mild L3 and L4 vertebral body height loss. There is enhancement along the disc margins and adjacent endplates. Findings are suspicious for possible discitis/osteomyelitis. Also at this level, there is a disc bulge. Disc osteophyte ridge which is most prominent within the central through left subarticular zones. Advanced facet arthrosis with ligamentum flavum hypertrophy. Severe central and left subarticular stenosis. Bilateral  neural foraminal narrowing (moderate right, severe left). Additional levels of lumbar spondylosis, most notably as follows. At L4-L5, there is multifactorial severe right subarticular narrowing with potential to affect the descending right L5 nerve root. Moderate central canal stenosis. Bilateral neural foraminal narrowing (severe right, moderate left). At L5-S1, there is multifactorial moderate right subarticular narrowing with crowding of the descending right S1 nerve root. Bilateral neural foraminal narrowing (severe right, mild left). Diffuse nonspecific heterogeneous marrow signal. Redemonstrated nonspecific mild presacral edema. Electronically Signed   By: Jackey LogeKyle  Golden DO   On: 06/07/2020 20:46   CT ABDOMEN PELVIS W CONTRAST  Result Date: 06/07/2020 CLINICAL DATA:  Abdominal distention. Nausea, vomiting. Bowel obstruction suspected. EXAM: CT ABDOMEN AND PELVIS WITH CONTRAST TECHNIQUE: Multidetector CT imaging of the abdomen and pelvis was performed using the standard protocol following bolus administration of intravenous contrast. CONTRAST:  100mL OMNIPAQUE IOHEXOL 350 MG/ML SOLN COMPARISON:  Two-view abdomen on 06/07/2020, CT of the abdomen and pelvis on 05/31/2020 FINDINGS: Lower chest: Thrombus is identified within the RIGHT LOWER lobe pulmonary artery and branches. The pulmonary arteries are not fully evaluated on this study of the abdomen. There are small bilateral pleural effusions associated with bibasilar atelectasis. A 4 millimeter nodule in the RIGHT middle lobe is adjacent to the minor fissure and likely represents intrafissural lymph node, not previously imaged. Coronary artery calcifications and coronary stent. Heart size is normal. No pericardial effusion. Hepatobiliary: Cholecystectomy.  Liver is unremarkable. Pancreas: Unremarkable. No pancreatic ductal dilatation or surrounding inflammatory changes. Spleen: Normal in size without focal abnormality. Adrenals/Urinary Tract: Adrenal glands are  normal in appearance. A subcentimeter low-attenuation lesion within the midpole region of the LEFT kidney likely represents a cyst but is too small fully characterize. No hydronephrosis. The ureters are unremarkable. Stomach/Bowel: Stomach is normal in appearance. Small bowel loops are normal in caliber and wall thickness. Appendix is normal.  There is colonic wall thickening, mucosal enhancement of the descending and sigmoid colon. The ascending and transverse colon have normal wall thickness. There is significant stool burden. Vascular/Lymphatic: There is minimal atherosclerotic calcification of the abdominal aorta. No aneurysm. Multiple small retroperitoneal lymph nodes appear stable. Reproductive: Hysterectomy. Low-attenuation in the RIGHT adnexal region is 1.4 centimeters, stable in appearance. LEFT adnexal region is unremarkable. Other: There is persistent presacral edema. Musculoskeletal: Significant sclerosis and endplate irregularity at L3-4, similar to the previous exam. These changes are new since the PET scan performed in 2010 and although chronic, is difficult to entirely exclude discitis. There are significant degenerative changes throughout the lumbar spine with disc height loss and facet hypertrophy at multiple levels. Remote sacral fracture. No acute fracture. IMPRESSION: 1. 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 The pulmonary arteries are not fully evaluated on this study of the abdomen. 2. Small bilateral pleural effusions associated with bibasilar atelectasis. 3. Coronary artery disease. 4. Persistent changes of colitis involving the descending and sigmoid colon, slightly improved since prior study. Significant stool burden. 5. Persistent sclerosis and endplate irregularity at L3-4, similar to the previous exam. These changes are new since the PET scan performed in 2010 and although chronic, is difficult to entirely exclude discitis. Recommend further evaluation with  MRI of the lumbar spine with and without contrast. 6. Stable appearance of presacral edema, of uncertain significance. MRI of the lumbar spine may help characterize further. 7. Stable appearance of 1.4 centimeter RIGHT ovarian cyst. Recommend follow-up pelvic ultrasound in 6 months. 8. Remote sacral fracture. 9. Aortic Atherosclerosis (ICD10-I70.0). Critical Value/emergent results were called by telephone at the time of interpretation on 06/07/2020 at 6:18 pm to provider Kathlen Mody , who verbally acknowledged these results. Electronically Signed   By: Norva Pavlov M.D.   On: 06/07/2020 18:19   DG Abd 2 Views  Result Date: 06/07/2020 CLINICAL DATA:  Vomiting EXAM: ABDOMEN - 2 VIEW COMPARISON:  Prior abdominal radiograph from 06/06/2020 FINDINGS: Colonic distension with stool in the ascending colon. Relatively under distended distal colon. Increasing gastric distension. On the upright or semi upright radiograph there is more lucency over the liver than expected, potentially related artifact though of uncertain significance. LEFT lower lobe airspace disease in the retrocardiac region. Marked spinal degenerative changes. IMPRESSION: 1. Increasing gastric distension. 2. Colonic distension with stool in the ascending colon. 3. Lucency over the liver on the upright or upright radiograph, potentially related artifact though cannot exclude pneumoperitoneum. CT may be helpful for further assessment 4. LEFT lower lobe airspace disease. These results were called by telephone at the time of interpretation on 06/07/2020 at 1:54 pm to provider Kathlen Mody , who verbally acknowledged these results. Electronically Signed   By: Donzetta Kohut M.D.   On: 06/07/2020 13:55        Scheduled Meds: . atorvastatin  10 mg Oral Q M,W,F  . budesonide  0.25 mg Inhalation BID  . calcium carbonate  1 tablet Oral TID WC  . carbamazepine  300 mg Oral BID  . Chlorhexidine Gluconate Cloth  6 each Topical Daily  . cholestyramine  4  g Oral TID  . enoxaparin (LOVENOX) injection  90 mg Subcutaneous Q12H  . feeding supplement  1 Container Oral TID BM  . feeding supplement (PRO-STAT SUGAR FREE 64)  30 mL Oral TID  . gabapentin  300 mg Oral TID  . levETIRAcetam  500 mg Oral BID  . mouth rinse  15 mL Mouth Rinse  BID  . multivitamin with minerals  1 tablet Oral Daily  . mupirocin ointment   Nasal BID  . potassium chloride  40 mEq Oral Daily  . sodium chloride flush  3 mL Intravenous Q12H   Continuous Infusions: . sodium chloride 10 mL/hr at 06/03/20 0800  . sodium chloride Stopped (05/31/20 1259)     LOS: 8 days        Kathlen Mody, MD [facility]   To contact the attending provider between 7A-7P or the covering provider during after hours 7P-7A, please log into the web site [email or web address] and access using universal [place] password for that web site. If you do not have the password, please call the hospital operator.  06/08/2020, 2:42 PM    Addendum: Notified by vascular studies RVT that patient has DVT in the right lower extremity. Pt is already on IV lovenox.  Family concerned about dark spots on the scalp area posteriorly, .  On exam she has small multiple dark papules on the posterior scalp area, non itchy, non erythematous.  For now continue to monitor.    Kathlen Mody, MD

## 2020-06-08 NOTE — Progress Notes (Signed)
IR requested by Dr. Blake Divine for possible image-guided L3-4 disc aspiration.  Images/case has been reviewed by Dr. Tommie Sams who approves procedure. However, chart check reveals patient refusing all interventions at this time (heparin shots for DVT prophylaxis, refusing blood draws, refusing to eat, ect.). TRH discussed with patient's sister/HCPOA and at this time recommends palliative care consult for GOC discussion. Will reassess for possible procedure following palliative consult- no plans for IR intervention at this time. Dr. Blake Divine aware.  IR to follow.   Waylan Boga Marcee Jacobs, PA-C 06/08/2020, 2:57 PM

## 2020-06-09 DIAGNOSIS — I700 Atherosclerosis of aorta: Secondary | ICD-10-CM | POA: Diagnosis present

## 2020-06-09 DIAGNOSIS — Z515 Encounter for palliative care: Secondary | ICD-10-CM

## 2020-06-09 DIAGNOSIS — K5900 Constipation, unspecified: Secondary | ICD-10-CM | POA: Diagnosis present

## 2020-06-09 DIAGNOSIS — I2510 Atherosclerotic heart disease of native coronary artery without angina pectoris: Secondary | ICD-10-CM | POA: Diagnosis present

## 2020-06-09 DIAGNOSIS — I2699 Other pulmonary embolism without acute cor pulmonale: Secondary | ICD-10-CM | POA: Clinically undetermined

## 2020-06-09 DIAGNOSIS — Z8669 Personal history of other diseases of the nervous system and sense organs: Secondary | ICD-10-CM

## 2020-06-09 DIAGNOSIS — Z66 Do not resuscitate: Secondary | ICD-10-CM

## 2020-06-09 DIAGNOSIS — I6930 Unspecified sequelae of cerebral infarction: Secondary | ICD-10-CM

## 2020-06-09 DIAGNOSIS — E871 Hypo-osmolality and hyponatremia: Secondary | ICD-10-CM | POA: Diagnosis not present

## 2020-06-09 DIAGNOSIS — N83201 Unspecified ovarian cyst, right side: Secondary | ICD-10-CM | POA: Diagnosis present

## 2020-06-09 DIAGNOSIS — E875 Hyperkalemia: Secondary | ICD-10-CM | POA: Diagnosis present

## 2020-06-09 DIAGNOSIS — K559 Vascular disorder of intestine, unspecified: Secondary | ICD-10-CM | POA: Diagnosis present

## 2020-06-09 DIAGNOSIS — M4800 Spinal stenosis, site unspecified: Secondary | ICD-10-CM | POA: Diagnosis present

## 2020-06-09 DIAGNOSIS — Z22322 Carrier or suspected carrier of Methicillin resistant Staphylococcus aureus: Secondary | ICD-10-CM

## 2020-06-09 DIAGNOSIS — E876 Hypokalemia: Secondary | ICD-10-CM | POA: Diagnosis not present

## 2020-06-09 DIAGNOSIS — J90 Pleural effusion, not elsewhere classified: Secondary | ICD-10-CM | POA: Clinically undetermined

## 2020-06-09 DIAGNOSIS — D638 Anemia in other chronic diseases classified elsewhere: Secondary | ICD-10-CM | POA: Diagnosis not present

## 2020-06-09 DIAGNOSIS — R739 Hyperglycemia, unspecified: Secondary | ICD-10-CM | POA: Diagnosis present

## 2020-06-09 DIAGNOSIS — Z9189 Other specified personal risk factors, not elsewhere classified: Secondary | ICD-10-CM

## 2020-06-09 DIAGNOSIS — I82401 Acute embolism and thrombosis of unspecified deep veins of right lower extremity: Secondary | ICD-10-CM | POA: Clinically undetermined

## 2020-06-09 DIAGNOSIS — Z91199 Patient's noncompliance with other medical treatment and regimen due to unspecified reason: Secondary | ICD-10-CM

## 2020-06-09 MED ORDER — CIPROFLOXACIN IN D5W 400 MG/200ML IV SOLN
400.0000 mg | Freq: Two times a day (BID) | INTRAVENOUS | Status: DC
  Administered 2020-06-09 – 2020-06-10 (×2): 400 mg via INTRAVENOUS
  Filled 2020-06-09 (×2): qty 200

## 2020-06-09 MED ORDER — PANTOPRAZOLE SODIUM 40 MG PO TBEC
40.0000 mg | DELAYED_RELEASE_TABLET | Freq: Every day | ORAL | Status: DC
  Administered 2020-06-09 – 2020-06-17 (×9): 40 mg via ORAL
  Filled 2020-06-09 (×9): qty 1

## 2020-06-09 MED ORDER — DICLOFENAC SODIUM 75 MG PO TBEC
75.0000 mg | DELAYED_RELEASE_TABLET | Freq: Two times a day (BID) | ORAL | Status: DC
  Administered 2020-06-09 – 2020-06-10 (×3): 75 mg via ORAL
  Filled 2020-06-09 (×4): qty 1

## 2020-06-09 MED ORDER — SODIUM CHLORIDE 0.9 % IV SOLN: INTRAVENOUS | Status: DC

## 2020-06-09 MED ORDER — METRONIDAZOLE IN NACL 5-0.79 MG/ML-% IV SOLN
500.0000 mg | Freq: Three times a day (TID) | INTRAVENOUS | Status: DC
  Administered 2020-06-09 – 2020-06-10 (×3): 500 mg via INTRAVENOUS
  Filled 2020-06-09 (×3): qty 100

## 2020-06-09 MED ORDER — SODIUM CHLORIDE 0.9 % IV SOLN: INTRAVENOUS | Status: AC

## 2020-06-09 NOTE — Progress Notes (Signed)
 PROGRESS NOTE  Nicole [name] MRN:[id number] DOB: 10/15/1951 DOA: 05/30/2020 PCP: Anderson, [name], [name]  HPI/Recap of past 24 hours: 68-year-old female with history of seizure disorder, stroke with left hemiparesis, hypertension, hyperlipidemia presents from group home with persistent diarrhea and generalized weakness.  Patient reports she had a prolonged hospital stay at [facility] for small bowel obstruction and was discharged on the 24th June.  And since then she has been having diarrhea.  C. difficile PCR and GI pathogen negative.  CT of the abdomen and pelvis shows Colonic wall thickening with pericolonic edema and fat stranding involving the distal descending through sigmoid colon consistent with colitis, likely infectious or inflammatory. Pt underwent flex sigmoidoscopy showing ischemic colitis. Rectal tube was removed. She was started on cholestyramine and low fiber diet.  Repeat abdomen and pelvis on 06/07/2020 shows thrombus in the right lower lobe on the lung, no bowel perforation.  She was started on Lovenox injection and venous duplex of the lower extremities revealed age indeterminate DVT involving the right common femoral vein and right proximal profunda vein.  In view of multiple medical problems, pt refusal to take heparin shots for DVT prophylaxis, refusing blood draws, refusing to eat, deconditioning, after extensive discussion with HCPOA, palliative care consulted for goals of care.   Hospital course complicated by CT findings of persistent sclerosis and endplate irregularity at L3-4, no changes since the PET scan done in 2010, difficult to entirely exclude discitis, confirmed by MRI lumbar spine.  ID has been consulted and following.  06/09/20: Seen and examined with her sister at her bedside.  She denies any abdominal pain at the time of this exam.  Improving diarrhea and no vomiting.  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   History of stroke with residual deficit (left hemiparesis)   Hypermagnesemia   Hypercalcemia   Hyperkalemia   Hyperglycemia   MRSA carrier   Hyponatremia   Hypokalemia   Anemia of chronic disease   Constipation in female   At risk for aspiration related to tube feeding   Pleural effusion, bilateral   Acute pulmonary embolism (HCC)   Coronary artery disease   Right ovarian cyst   Aortic atherosclerosis (HCC)   Spinal stenosis   Ischemic colitis (HCC)   Current non-adherence to medical treatment (refusing blood draws) while in hospital   Right leg DVT (HCC)   DNR (do not resuscitate)   Improving diarrhea secondary to ischemic colitis Seen by GI, post flex sigmoidoscopy/colonoscopy Path report from colonoscopy done on 06/04/2020 showed colonic mucosa and inflamed granulation tissue consistent with ulcer.  The differential diagnosis includes segmental colitis associated with diverticulitis.  Clinical correlation is essential. No diverticulitis seen on CT scan or colonoscopy.  Per GI, unlikely this is related to diverticulitis. C. difficile PCR and GI panel negative Continue supportive care, cholestyramine, gentle IV fluid hydration, as recommended by GI  Acute pulmonary embolus in the right lower lobe of the lung,(incidental finding on the CT of   the abdomen pelvis)/right lower extremity DVT Started the patient on Lovenox injection as patient is refusing blood draws and would not be ideal to being on IV heparin.          Venous duplex of the lower extremities  revealed age indeterminate DVT involving            the right common femoral vein and right proximal profunda vein. Continue full dose Lovenox twice daily Monitor CBC  L3-4 discitis on CT scan, confirmed by MRI of lumbar spine 06/07/20 ID recommended aspiration by IR for cultures prior to initiating IV antibiotics Appreciate IDs assistance MRSA screen positive on 05/31/2020 Blood cultures drawn on 05/30/2020 - final. No significant pain at the time of this exam Pain management in place as needed  Seizure disorder Stable, no reported seizure activity Continue home Keppra and carbamazepine  Hyperlipidemia Continue Lipitor  History of CVA Continue statin  GERD Resume PPI  Physical debility PT assessment recommended SNF TOC assisting with placement and disposition Continue PT OT with assistance and fall precautions   Chronic normocytic anemia likely secondary to chronic illness  Baseline hemoglobin appears to be around 12. Slow drop in hemoglobin  from 12 to 10.1, 9.6, 8.8 to  10.1 Anemia panel reviewed showed normal ferritin and iron levels. Flex sigmoidoscopy reviewed. No bleeding per rectum.  Pt refusing further blood work.  Continue to monitor H&H Maintain hemoglobin above 7.0  Resolved hypokalemia  Post repletion Serum potassium 4.0   Deep tissue pressure injury of the heels, POA Pressure Injury 06/07/20 Heel Left Unstageable - Full thickness tissue loss in which the base of the injury is covered by slough (yellow, tan, gray, green or brown) and/or eschar (tan, brown or black) in the wound bed. (Active)  06/07/20 0800  Location: Heel  Location Orientation: Left  Staging: Unstageable - Full thickness tissue loss in which the base of the injury is covered by slough (yellow, tan, gray, green or brown) and/or eschar (tan, brown or black) in the wound bed.  Wound Description (Comments):   Present on Admission:      Pressure Injury 06/07/20 Heel Anterior;Right;Left Stage  1 -  Intact skin with non-blanchable redness of a localized area usually over a bony prominence. (Active)  06/07/20 0800  Location: Heel  Location Orientation: Anterior;Right;Left  Staging: Stage 1 -  Intact skin with non-blanchable redness of a localized area usually over a bony prominence.  Wound Description (Comments):   Present on Admission:   Wound care consulted and recommendations given.   DVT prophylaxis: Lovenox subcu, full dose, twice daily Code Status: DNR Family Communication:  Updated sister at bedside. Disposition:   Status is: Inpatient  Remains inpatient appropriate because:Ongoing diagnostic testing needed, pending this aspiration by IR and initiation of IV antibiotics   Dispo:             Patient From: Home             Planned Disposition: SNF             Expected discharge date: 06/12/20             Medically stable for discharge: No        Consultants:   Gastroenterology.   ID  IR  Procedures: none.   Antimicrobials:  Pending.     Objective: Vitals:   06/08/20 1339 06/08/20 2053 06/09/20 0440 06/09/20 0724  BP: 132/69 126/70 126/64   Pulse: 88 (!) 105 96   Resp: 18 16   17   Temp: 98.6 F (37 C) 99.3 F (37.4 C) 98.4 F (36.9 C)   TempSrc: Oral Oral Oral   SpO2: 99% 99% 99% 93%  Weight:      Height:        Intake/Output Summary (Last 24 hours) at 06/09/2020 1346 Last data filed at 06/09/2020 1300 Gross per 24 hour  Intake 600 ml  Output 850 ml  Net -250 ml   Filed Weights   06/04/20 0500 06/04/20 1340 06/05/20 0500  Weight: 94.1 kg 94.1 kg 88.6 kg    Exam:  . General: 68 y.o. year-old female well developed well nourished in no acute distress.  Alert and interactive. . Cardiovascular: Regular rate and rhythm with no rubs or gallops.  No thyromegaly or JVD noted.   . Respiratory: Clear to auscultation with no wheezes or rales. Good inspiratory effort. . Abdomen: Soft nontender nondistended with normal bowel sounds  x4 quadrants. . Musculoskeletal: Trace lower extremity edema bilaterally . Psychiatry: Mood is appropriate for condition and setting   Data Reviewed: CBC: Recent Labs  Lab 06/04/20 1241 06/08/20 0721  WBC 4.9 5.0  NEUTROABS 3.9  --   HGB 10.1* 9.5*  HCT 31.9* 29.7*  MCV 99.7 98.3  PLT 391 355   Basic Metabolic Panel: Recent Labs  Lab 06/03/20 1210 06/04/20 1241  NA 137 137  K 3.2* 4.0  CL 101 100  CO2 27 29  GLUCOSE 209* 108*  BUN 6* 6*  CREATININE 0.56 0.47  CALCIUM 8.8* 9.5   GFR: Estimated Creatinine Clearance: 74.8 mL/min (by C-G formula based on SCr of 0.47 mg/dL). Liver Function Tests: No results for input(s): AST, ALT, ALKPHOS, BILITOT, PROT, ALBUMIN in the last 168 hours. No results for input(s): LIPASE, AMYLASE in the last 168 hours. No results for input(s): AMMONIA in the last 168 hours. Coagulation Profile: No results for input(s): INR, PROTIME in the last 168 hours. Cardiac Enzymes: No results for input(s): CKTOTAL, CKMB, CKMBINDEX, TROPONINI in the last 168 hours. BNP (last 3 results) No results for input(s): PROBNP in the last 8760 hours. HbA1C: No results for input(s): HGBA1C in the last 72 hours. CBG: No results for input(s): GLUCAP in the last 168 hours. Lipid Profile: No results for input(s): CHOL, HDL, LDLCALC, TRIG, CHOLHDL, LDLDIRECT in the last 72 hours. Thyroid Function Tests: No results for input(s): TSH, T4TOTAL, FREET4, T3FREE, THYROIDAB in the last 72 hours. Anemia Panel: No results for input(s): VITAMINB12, FOLATE, FERRITIN, TIBC, IRON, RETICCTPCT in the last 72 hours. Urine analysis:    Component Value Date/Time   COLORURINE AMBER (A) 05/31/2020 1005   APPEARANCEUR HAZY (A) 05/31/2020 1005   LABSPEC 1.045 (H) 05/31/2020 1005   PHURINE 5.0 05/31/2020 1005   GLUCOSEU NEGATIVE 05/31/2020 1005   HGBUR MODERATE (A) 05/31/2020 1005   BILIRUBINUR NEGATIVE 05/31/2020 1005   KETONESUR NEGATIVE 05/31/2020 1005   PROTEINUR NEGATIVE  05/31/2020 1005   NITRITE NEGATIVE 05/31/2020 1005   LEUKOCYTESUR MODERATE (A) 05/31/2020 1005   Sepsis Labs: @LABRCNTIP(procalcitonin:4,lacticidven:4)  ) Recent Results (from the past 240 hour(s))  Culture, blood (routine x 2)     Status: None   Collection Time: 05/30/20 11:02 PM   Specimen: BLOOD  Result Value Ref Range Status   Specimen Description BLOOD RIGHT ANTECUBITAL  Final   Special Requests   Final    BOTTLES DRAWN AEROBIC AND ANAEROBIC Blood Culture results may not be optimal due to an excessive volume of blood received in culture bottles   Culture     Final    NO GROWTH 5 DAYS Performed at [facility], [address] [place], [place] [address]    Report Status 06/04/2020 FINAL  Final  Urine culture     Status: None   Collection Time: 05/30/20 11:08 PM   Specimen: Urine, Random  Result Value Ref Range Status   Specimen Description URINE, RANDOM  Final   Special Requests NONE  Final   Culture   Final    NO GROWTH Performed at [facility], [address] [place], [place] [address]    Report Status 06/01/2020 FINAL  Final  Culture, blood (routine x 2)     Status: None   Collection Time: 05/30/20 11:47 PM   Specimen: BLOOD  Result Value Ref Range Status   Specimen Description BLOOD SITE NOT SPECIFIED  Final   Special Requests   Final    BOTTLES DRAWN AEROBIC ONLY Blood Culture results may not be optimal due to an inadequate volume of blood received in culture bottles   Culture   Final    NO GROWTH 5 DAYS Performed at [facility], [address] [place], [place] [address]    Report Status 06/05/2020 FINAL  Final  SARS Coronavirus 2 by RT PCR (hospital order, performed in [facility] hospital lab) Nasopharyngeal Nasopharyngeal Swab     Status: None   Collection Time: 05/31/20  1:32 AM   Specimen: Nasopharyngeal Swab  Result Value Ref Range Status   SARS Coronavirus 2 NEGATIVE NEGATIVE Final    Comment: (NOTE) SARS-CoV-2 target nucleic acids  are NOT DETECTED.  The SARS-CoV-2 RNA is generally detectable in upper and lower respiratory specimens during the acute phase of infection. The lowest concentration of SARS-CoV-2 viral copies this assay can detect is 250 copies / mL. A negative result does not preclude SARS-CoV-2 infection and should not be used as the sole basis for treatment or other patient management decisions.  A negative result may occur with improper specimen collection / handling, submission of specimen other than nasopharyngeal swab, presence of viral mutation(s) within the areas targeted by this assay, and inadequate number of viral copies (<250 copies / mL). A negative result must be combined with clinical observations, patient history, and epidemiological information.  Fact Sheet for Patients:   https://www.fda.gov/media/136312/download  Fact Sheet for Healthcare Providers: https://www.fda.gov/media/136313/download  This test is not yet approved or  cleared by the [place] FDA and has been authorized for detection and/or diagnosis of SARS-CoV-2 by FDA under an Emergency Use Authorization (EUA).  This EUA will remain in effect (meaning this test can be used) for the duration of the COVID-19 declaration under Section 564(b)(1) of the Act, 21 U.S.C. section 360bbb-3(b)(1), unless the authorization is terminated or revoked sooner.  Performed at [facility], [address] [place], [place] [address]   MRSA PCR Screening     Status: Abnormal   Collection Time: 05/31/20  6:15 AM   Specimen: Nasal Mucosa; Nasopharyngeal  Result Value Ref Range Status   MRSA by PCR POSITIVE (A) NEGATIVE Final    Comment:        The GeneXpert MRSA Assay (FDA approved for NASAL specimens only), is one component of a comprehensive MRSA colonization surveillance program. It is not intended to diagnose MRSA infection nor to guide or monitor treatment for MRSA infections. RESULT CALLED TO, READ BACK BY AND  VERIFIED WITH: H. Van Kretschmar RN 9:00 05/31/20 ([email or web address]) Performed at [facility], [address] [place], [place] [address]   MRSA PCR Screening     Status: Abnormal   Collection Time: 05/31/20 11:46 AM   Specimen: Nasopharyngeal  Result Value Ref Range Status   MRSA by PCR POSITIVE (A) NEGATIVE Final    Comment:        The GeneXpert MRSA Assay (FDA approved for NASAL specimens only), is one component of a comprehensive MRSA colonization surveillance program. It is not intended to diagnose MRSA infection nor to guide or monitor treatment for MRSA infections. CRITICAL VALUE NOTED.  VALUE IS CONSISTENT WITH PREVIOUSLY REPORTED AND CALLED VALUE. Performed at [facility], [address] [place], [place] [address]   Gastrointestinal Panel by PCR , Stool     Status: None   Collection Time: 06/01/20 11:45 AM   Specimen: Stool  Result Value Ref Range Status   Campylobacter species NOT DETECTED NOT DETECTED Final   Plesimonas shigelloides NOT DETECTED NOT DETECTED Final   Salmonella species NOT DETECTED NOT DETECTED Final   Yersinia enterocolitica NOT DETECTED NOT DETECTED Final   Vibrio species NOT DETECTED NOT DETECTED Final   Vibrio cholerae NOT DETECTED NOT DETECTED Final   Enteroaggregative E coli (EAEC) NOT DETECTED NOT DETECTED Final   Enteropathogenic E coli (EPEC) NOT DETECTED NOT DETECTED Final   Enterotoxigenic E coli (ETEC) NOT DETECTED NOT DETECTED Final   Shiga like toxin producing E coli (STEC) NOT DETECTED NOT DETECTED Final   Shigella/Enteroinvasive E coli (EIEC) NOT DETECTED NOT DETECTED Final   Cryptosporidium NOT DETECTED NOT DETECTED Final   Cyclospora cayetanensis NOT DETECTED NOT DETECTED Final   Entamoeba histolytica NOT DETECTED NOT DETECTED Final   Giardia lamblia NOT DETECTED NOT DETECTED Final   Adenovirus F40/41 NOT DETECTED NOT DETECTED Final   Astrovirus NOT DETECTED NOT DETECTED Final   Norovirus GI/GII NOT DETECTED NOT DETECTED  Final   Rotavirus A NOT DETECTED NOT DETECTED Final   Sapovirus (I, II, IV, and V) NOT DETECTED NOT DETECTED Final    Comment: Performed at [facility], [address] [place], [place] [address]  C Difficile Quick Screen w PCR reflex     Status: None   Collection Time: 06/01/20 11:45 AM   Specimen: STOOL  Result Value Ref Range Status   C Diff antigen NEGATIVE NEGATIVE Final   C Diff toxin NEGATIVE NEGATIVE Final   C Diff interpretation No C. difficile detected.  Final    Comment: Performed at [facility], [address] [place], [place] [address]      Studies: VAS US LOWER EXTREMITY VENOUS (DVT)  Result Date: 06/08/2020  Lower Venous DVTStudy Indications: Edema.  Limitations: Patient cooperation. Comparison Study: 06/01/20 Performing Technologist: Charlotte Bynum RDMS, RVT  Examination Guidelines: A complete evaluation includes B-mode imaging, spectral Doppler, color Doppler, and power Doppler as needed of all accessible portions of each vessel. Bilateral testing is considered an integral part of a complete examination. Limited examinations for reoccurring indications may be performed as noted. The reflux portion of the exam is performed with the patient in reverse Trendelenburg.  +---------+---------------+---------+-----------+-----------+------------------+ RIGHT    CompressibilityPhasicitySpontaneityProperties Thrombus Aging     +---------+---------------+---------+-----------+-----------+------------------+ CFV      Partial        Yes      Yes        brightly   Age Indeterminate                                                echogenic                     +---------+---------------+---------+-----------+-----------+------------------+ SFJ      Full                                                             +---------+---------------+---------+-----------+-----------+------------------+ FV Prox  Full                                                              +---------+---------------+---------+-----------+-----------+------------------+ FV Mid   Full                                                             +---------+---------------+---------+-----------+-----------+------------------+ FV DistalFull                                                             +---------+---------------+---------+-----------+-----------+------------------+ PFV      None                               softly                                                                    echogenic                     +---------+---------------+---------+-----------+-----------+------------------+ POP                     Yes      Yes                   unable to compress +---------+---------------+---------+-----------+-----------+------------------+ PTV      Full                                                             +---------+---------------+---------+-----------+-----------+------------------+ PERO     Full                                                             +---------+---------------+---------+-----------+-----------+------------------+ GSV  Full                                                             +---------+---------------+---------+-----------+-----------+------------------+ Non-occlusive, age indeterminate thrombus seen in the right distal CFV extending from the [facility].  +---------+---------------+---------+-----------+----------+------------------+ LEFT     CompressibilityPhasicitySpontaneityPropertiesThrombus Aging     +---------+---------------+---------+-----------+----------+------------------+ CFV      Full           Yes      Yes                                     +---------+---------------+---------+-----------+----------+------------------+ SFJ      Full                                                             +---------+---------------+---------+-----------+----------+------------------+ FV Prox  Full                                                            +---------+---------------+---------+-----------+----------+------------------+ FV Mid   Full                                                            +---------+---------------+---------+-----------+----------+------------------+ FV DistalFull                                                            +---------+---------------+---------+-----------+----------+------------------+ PFV      Full                                                            +---------+---------------+---------+-----------+----------+------------------+ POP                     Yes      Yes                  unable to compress +---------+---------------+---------+-----------+----------+------------------+ PTV      Full                                                            +---------+---------------+---------+-----------+----------+------------------+ PERO     Full                                                            +---------+---------------+---------+-----------+----------+------------------+  GSV      Full                                                            +---------+---------------+---------+-----------+----------+------------------+    Summary: RIGHT: - Findings consistent with age indeterminate deep vein thrombosis involving the right common femoral vein, and right proximal profunda vein. - No cystic structure found in the popliteal fossa.  LEFT: - There is no evidence of deep vein thrombosis in the lower extremity. However, portions of this examination were limited- see technologist comments above.  - No cystic structure found in the popliteal fossa.  *See table(s) above for measurements and observations. Electronically signed by Christopher Dickson MD on 06/08/2020 at 10:57:28 PM.    Final     Scheduled  Meds: . atorvastatin  10 mg Oral Q M,W,F  . budesonide  0.25 mg Inhalation BID  . calcium carbonate  1 tablet Oral TID WC  . carbamazepine  300 mg Oral BID  . cholestyramine  4 g Oral TID  . enoxaparin (LOVENOX) injection  90 mg Subcutaneous Q12H  . feeding supplement  1 Container Oral TID BM  . feeding supplement (PRO-STAT SUGAR FREE 64)  30 mL Oral TID  . gabapentin  300 mg Oral TID  . levETIRAcetam  500 mg Oral BID  . mouth rinse  15 mL Mouth Rinse BID  . multivitamin with minerals  1 tablet Oral Daily  . mupirocin ointment   Nasal BID  . potassium chloride  40 mEq Oral Daily  . sodium chloride flush  3 mL Intravenous Q12H    Continuous Infusions: . sodium chloride 10 mL/hr at 06/03/20 0800  . sodium chloride Stopped (05/31/20 1259)     LOS: 9 days     [name] N Hall, MD [facility] Pager [phone]  If 7PM-7AM, please contact night-coverage [email or web address] Password [email or web address] 06/09/2020, 1:46 PM   

## 2020-06-09 NOTE — Progress Notes (Signed)
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  She was recently admitted with a small bowel obstruction and diarrhea, discharged on 6/24.  She continued to have diarrhea after discharge, poor p.o. intake and was readmitted on 6/27 with shock, confusion.    OT comments  Pt seen for OT follow up with focus on increased OOB Mobility and BADL participation. Pt completed supine <> sit with total A +2. Once EOB pt required mod-max postural support to correct posterior lean. Family/friend present to offer max encouragement, pt will not fully participate without their support. Pt sat EOB ~5 mins with mod-max truncal assist. Pt then tried stedy for sit <> stands, ultimately requiring total A +2 and not able to clear hips from bed. Pt sister asking OT to assist with shower in bathroom, educated pt sister on safety and pt current level of function. D/c recs remain appropriate. Will continue to follow.    Follow Up Recommendations  SNF;Supervision/Assistance - 24 hour    Equipment Recommendations  Other (comment);Wheelchair (measurements OT);Wheelchair cushion (measurements OT) (hoyer lift/hoyer lift pads)    Recommendations for Other Services      Precautions / Restrictions Precautions Precautions: Fall Restrictions Weight Bearing Restrictions: No       Mobility Bed Mobility Overal bed mobility: Needs Assistance Bed Mobility: Supine to Sit;Sit to Supine Rolling: Total assist;+2 for physical assistance   Supine to sit: Total assist;+2 for physical assistance Sit to supine: +2 for physical assistance;Total assist   General bed mobility comments: total A +2 to roll and to perform supine <> sit. Needing max encouragement from family  Transfers Overall transfer level:  Needs assistance Equipment used: Ambulation equipment used Transfers: Sit to/from Stand Sit to Stand: Total assist;+2 physical assistance         General transfer comment: attempted with stedy, pt not able to clear hips after x2 attempts of total A +2 and use of bed pad for additional support    Balance Overall balance assessment: Needs assistance Sitting-balance support: Feet supported;Bilateral upper extremity supported;Single extremity supported Sitting balance-Leahy Scale: Poor Sitting balance - Comments: needs cues for appropriate sitting posture, intermittent mod-max A Postural control: Posterior lean                                 ADL either performed or assessed with clinical judgement   ADL Overall ADL's : Needs assistance/impaired                     Lower Body Dressing: Total assistance;+2 for physical assistance;+2 for safety/equipment Lower Body Dressing Details (indicate cue type and reason): attempted to simulate with sit <> stand and stedy. Not able to lift hips off of bed with total A +2               General ADL Comments: session focused on sitting tolerance in preparation for OOB activity. 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 with L sided weakness        Exercises     Shoulder Instructions       General Comments      Pertinent Vitals/ Pain       Pain Assessment: Faces Faces Pain Scale: Hurts little more Pain Location: generalized Pain Descriptors  / Indicators: Grimacing;Sharp;Moaning;Guarding Pain Intervention(s): Monitored during session;Limited activity within patient's tolerance  Home Living                                          Prior Functioning/Environment              Frequency  Min 2X/week        Progress Toward Goals  OT Goals(current goals can now be found in the care plan section)  Progress towards OT goals: Progressing toward goals  Acute Rehab OT Goals Patient Stated Goal: none stated OT Goal Formulation: With patient/family Time For Goal Achievement: 06/16/20 Potential to Achieve Goals: Fair  Plan Discharge plan remains appropriate    Co-evaluation                 AM-PAC OT "6 Clicks" Daily Activity     Outcome Measure   Help from another person eating meals?: A Lot Help from another person taking care of personal grooming?: A Little Help from another person toileting, which includes using toliet, bedpan, or urinal?: Total Help from another person bathing (including washing, rinsing, drying)?: A Lot Help from another person to put on and taking off regular upper body clothing?: A Lot Help from another person to put on and taking off regular lower body clothing?: Total 6 Click Score: 11    End of Session    OT Visit Diagnosis: Muscle weakness (generalized) (M62.81);Other symptoms and signs involving the nervous system (R29.898);Other symptoms and signs involving cognitive function;Pain;Hemiplegia and hemiparesis Hemiplegia - Right/Left: Left Hemiplegia - dominant/non-dominant: Non-Dominant Hemiplegia - caused by: Unspecified Pain - part of body:  (generalized)   Activity Tolerance Patient tolerated treatment well   Patient Left in bed;with call bell/phone within reach;with bed alarm set;with family/visitor present   Nurse Communication Mobility status        Time: 3016-0109 OT Time Calculation (min): 38 min  Charges: OT General Charges $OT Visit: 1  Visit OT Treatments $Self Care/Home Management : 38-52 mins  Dalphine Handing, MSOT, OTR/L Acute Rehabilitation Services [facility] Office Number: [phone] Pager: [phone]  Dalphine Handing 06/09/2020, 4:08 PM

## 2020-06-09 NOTE — Progress Notes (Signed)
Pharmacy Antibiotic Note  Nicole [name] is a 69 y.o. 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  She was recently admitted with a small bowel obstruction and diarrhea, discharged on 6/24.  She continued to have diarrhea after discharge, poor p.o. intake and was readmitted on 6/27 with shock, confusion.  S/p colonoscopy 7/2. GI noted Ischemic colitis slow to resolve. Diarrhea persisting. Starting  IV Cipro/Flagyl.    Pharmacy has been consulted for Cipro dosing for segmental colitis.  Plan: Cipro 400 mg IV q12 hours Monitor clinical status, renal function and culture results daily.    Height: 5\' 7"  (170.2 cm) Weight: 88.6 kg (195 lb 5.2 oz) IBW/kg (Calculated) : 61.6  Temp (24hrs), Avg:98.7 F (37.1 C), Min:98.3 F (36.8 C), Max:99.3 F (37.4 C)  Recent Labs  Lab 06/03/20 1210 06/04/20 1241 06/08/20 0721  WBC  --  4.9 5.0  CREATININE 0.56 0.47  --     Estimated Creatinine Clearance: 74.8 mL/min (by C-G formula based on SCr of 0.47 mg/dL).    Allergies  Allergen Reactions  . Seasonal Ic [Cholestatin]     Runny nose    Antimicrobials this admission: Vanc 6/28>>6/29 Zosyn 6/28>>7/1 Ancef 6/27 x 1 Cipro 7/7>> Flagyl 7/7>>  Dose adjustments this admission:   Microbiology results: 6/28 UCx: negative 6/28 GI panel: negative 6/28 Cdiff: negative 6/28 MRSA PCR: positive 6/27 BCx: negative   Thank you for allowing pharmacy to be a part of this patient's care. 7/27, RPh Clinical Pharmacist  Please check AMION for all [facility] phone numbers After 10:00 PM, call Main Pharmacy [phone]   06/09/2020 5:28 PM

## 2020-06-09 NOTE — Progress Notes (Signed)
[facility] Gastroenterology Progress Note  Nicole [name] 69 y.o. Jun 14, 1951  CC:  Diarrhea  Subjective: Patient is somnolent and not interactive today.   Sister is at bedside and states patient appears weaker and more tired.  She states patient has not eaten much lately or had much of an appetite.  She believes patient's diarrhea is improving.  Per flow sheet, two liquid bowel movements this morning.  ROS : Limited due to lethargy. Denies abdominal pain.  Objective: Vital signs in last 24 hours: Vitals:   06/09/20 0440 06/09/20 0724  BP: 126/64   Pulse: 96   Resp: 17   Temp: 98.4 F (36.9 C)   SpO2: 99% 93%    Physical Exam:  General:  Somnolent, elderly, no acute distress  Head:  Normocephalic, without obvious abnormality, atraumatic  Eyes:  Anicteric sclera, EOMs intact  Lungs:   Clear to auscultation bilaterally, mild tachypnea   Heart:  Regular rate and rhythm, S1, S2 normal  Abdomen:   Soft, non-tender, non-distended, bowel sounds active all four quadrants, no guarding or peritoneal signs  Extremities: SCDs in place  Pulses: 2+ and symmetric    Lab Results: No results for input(s): NA, K, CL, CO2, GLUCOSE, BUN, CREATININE, CALCIUM, MG, PHOS in the last 72 hours. No results for input(s): AST, ALT, ALKPHOS, BILITOT, PROT, ALBUMIN in the last 72 hours. Recent Labs    06/08/20 0721  WBC 5.0  HGB 9.5*  HCT 29.7*  MCV 98.3  PLT 355   No results for input(s): LABPROT, INR in the last 72 hours.  Assessment: Diarrhea secondary to ischemic colitis, improving. -Path report from 7/2 colonoscopy: Colonic mucosa and inflamed granulation tissue consistent with ulcer.  The differential diagnosis includes segmental colitis associated with  diverticulitis. Clinical correlation is essential.   Plan: No diverticulitis on colonoscopic evaluation or CT.  Unlikely this is related to diverticulitis.    Recommend continued supportive care for ischemic colitis.    We will defer  antibiotic therapy at this time, as diarrhea continues to improve.  Continue cholestyramine.  We will follow at a distance.  Please contact us sooner if urgent evaluation is required.  Edrick Kins PA-C 06/09/2020, 9:56 AM  Contact #  [phone]

## 2020-06-10 DIAGNOSIS — M4640 Discitis, unspecified, site unspecified: Secondary | ICD-10-CM

## 2020-06-10 LAB — BASIC METABOLIC PANEL
Anion gap: 8 (ref 5–15)
BUN: 10 mg/dL (ref 8–23)
CO2: 25 mmol/L (ref 22–32)
Calcium: 8.6 mg/dL — ABNORMAL LOW (ref 8.9–10.3)
Chloride: 99 mmol/L (ref 98–111)
Creatinine, Ser: 0.45 mg/dL (ref 0.44–1.00)
GFR calc Af Amer: >60 mL/min (ref >60)
GFR calc non Af Amer: >60 mL/min (ref >60)
Glucose, Bld: 124 mg/dL — ABNORMAL HIGH (ref 70–99)
Potassium: 4 mmol/L (ref 3.5–5.1)
Sodium: 132 mmol/L — ABNORMAL LOW (ref 135–145)

## 2020-06-10 LAB — CBC WITH DIFFERENTIAL/PLATELET
Abs Immature Granulocytes: 0.06 10*3/uL (ref 0.00–0.07)
Basophils Absolute: 0 10*3/uL (ref 0.0–0.1)
Basophils Relative: 0 %
Eosinophils Absolute: 0.1 10*3/uL (ref 0.0–0.5)
Eosinophils Relative: 1 %
HCT: 26.5 % — ABNORMAL LOW (ref 36.0–46.0)
Hemoglobin: 8.5 g/dL — ABNORMAL LOW (ref 12.0–15.0)
Immature Granulocytes: 1 %
Lymphocytes Relative: 19 %
Lymphs Abs: 1.3 10*3/uL (ref 0.7–4.0)
MCH: 31.7 pg (ref 26.0–34.0)
MCHC: 32.1 g/dL (ref 30.0–36.0)
MCV: 98.9 fL (ref 80.0–100.0)
Monocytes Absolute: 0.7 10*3/uL (ref 0.1–1.0)
Monocytes Relative: 10 %
Neutro Abs: 4.7 10*3/uL (ref 1.7–7.7)
Neutrophils Relative %: 69 %
Platelets: 368 10*3/uL (ref 150–400)
RBC: 2.68 MIL/uL — ABNORMAL LOW (ref 3.87–5.11)
RDW: 14.1 % (ref 11.5–15.5)
WBC: 6.8 10*3/uL (ref 4.0–10.5)
nRBC: 0 % (ref 0.0–0.2)

## 2020-06-10 LAB — PHOSPHORUS: Phosphorus: 2.7 mg/dL (ref 2.5–4.6)

## 2020-06-10 LAB — MAGNESIUM: Magnesium: 1.8 mg/dL (ref 1.7–2.4)

## 2020-06-10 MED ORDER — ACETAMINOPHEN 500 MG PO TABS
1000.0000 mg | ORAL_TABLET | Freq: Three times a day (TID) | ORAL | Status: DC
  Administered 2020-06-10 – 2020-06-17 (×22): 1000 mg via ORAL
  Filled 2020-06-10 (×23): qty 2

## 2020-06-10 MED ORDER — ENOXAPARIN SODIUM 100 MG/ML ~~LOC~~ SOLN: 90.0000 mg | Freq: Two times a day (BID) | SUBCUTANEOUS | Status: DC

## 2020-06-10 MED ORDER — METRONIDAZOLE IN NACL 5-0.79 MG/ML-% IV SOLN
500.0000 mg | Freq: Three times a day (TID) | INTRAVENOUS | Status: DC
  Administered 2020-06-10 – 2020-06-12 (×6): 500 mg via INTRAVENOUS
  Filled 2020-06-10 (×6): qty 100

## 2020-06-10 MED ORDER — CIPROFLOXACIN IN D5W 400 MG/200ML IV SOLN
400.0000 mg | Freq: Two times a day (BID) | INTRAVENOUS | Status: DC
  Administered 2020-06-10 – 2020-06-12 (×4): 400 mg via INTRAVENOUS
  Filled 2020-06-10 (×6): qty 200

## 2020-06-10 MED ORDER — GABAPENTIN 300 MG PO CAPS
600.0000 mg | ORAL_CAPSULE | Freq: Every day | ORAL | Status: DC
  Administered 2020-06-10 – 2020-06-16 (×7): 600 mg via ORAL
  Filled 2020-06-10 (×7): qty 2

## 2020-06-10 MED ORDER — OXYCODONE HCL 5 MG PO TABS: 5.0000 mg | ORAL_TABLET | ORAL | Status: DC | PRN

## 2020-06-10 NOTE — Progress Notes (Signed)
[facility] Gastroenterology Progress Note  LORANDA MASTEL 69 y.o. 12-13-1950  CC:  Diarrhea  Subjective: Patient denies abdominal pain, nausea, or vomiting.  She feels tired.  Per flow sheet, patient had a liquid bowel movement last night but no bowel movements thus far today.  ROS : Review of Systems  Constitutional: Positive for malaise/fatigue. Negative for chills.  Gastrointestinal: Positive for diarrhea. Negative for abdominal pain, blood in stool, constipation, heartburn, melena, nausea and vomiting.   Objective: Vital signs in last 24 hours: Vitals:   06/10/20 0534 06/10/20 0814  BP: 133/84   Pulse: (!) 101 97  Resp: 16 16  Temp: 98.2 F (36.8 C)   SpO2: 95% 95%    Physical Exam:  General:  Lethargic, elderly, cooperative, no acute distress  Head:  Normocephalic, without obvious abnormality, atraumatic  Eyes:  Anicteric sclera, EOMs intact  Lungs:   Decreased right-sided aeration, no respiratory distress   Heart:  Regular rate and rhythm, S1, S2 normal  Abdomen:   Soft, non-tender, non-distended, bowel sounds active all four quadrants, no guarding or peritoneal signs  Extremities: SCDs in place  Pulses: 2+ and symmetric    Lab Results: Recent Labs    06/10/20 0030  NA 132*  K 4.0  CL 99  CO2 25  GLUCOSE 124*  BUN 10  CREATININE 0.45  CALCIUM 8.6*  MG 1.8  PHOS 2.7   No results for input(s): AST, ALT, ALKPHOS, BILITOT, PROT, ALBUMIN in the last 72 hours. Recent Labs    06/08/20 0721 06/10/20 0030  WBC 5.0 6.8  NEUTROABS  --  4.7  HGB 9.5* 8.5*  HCT 29.7* 26.5*  MCV 98.3 98.9  PLT 355 368   No results for input(s): LABPROT, INR in the last 72 hours.  Assessment: Diarrhea secondary to ischemic colitis -Path report from 7/2 colonoscopy: Colonic mucosa and inflamed granulation tissue consistent with ulcer.  The differential diagnosis includes segmental colitis associated with  diverticulitis. Clinical correlation is essential.  -Started  Cipro/Flagyl yesterday; diarrhea seems to be improving, no stools thus far today  Acute PE  Discitis   Plan: Continue Cipro/Flagyl for persistent ischemic colitis.  Continue cholestyramine.  Continue supportive care.  Edrick Kins PA-C 06/10/2020, 10:39 AM  Contact #  [phone]

## 2020-06-10 NOTE — Consult Note (Addendum)
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 At baseline she was ambulatory with a walker and could communicate her basic needs and follow directions.  She is now hospitalized with an unusual set of conditions- Lumbar discitis without known bacteremia, persistent intermittent diarrhea alternating with severe constipation with segmental colitis thought to be ischemic colitis by GI but with some clinical uncertainty and a newly diagnosed DVT and PE. Palliative care was consulted because patient is refusing treatment and has a legal guardian (her sister).  I evaluated Nicole [name] today at bedside. She was awake, made good eye contact and she was very conversant with me after I earned her trust and talked about her stuffed bear. Following my visit I had a detailed conversation with her sister and legal guardian.  Anaysia's sister reports a long history of being cared for in different care facilities and group homes she is extremely sensitive to approach and often childlike but with a very stubborn and occasionally difficult personality if forced to do something she doesn't want to do or if she does not trust a provider or caregiver.  During my conversation with Nicole [name]'s sister Nicole [name] she raised several concerns and issues with the care Nicole [name] has and is receiving as follows:  1. She does not feel like providers or staff have communicated well with her about Evolette's situation:  Nicole [name] clearly understands the course of events over the last few months and is a good source of history and information, but does not know many details  from this hospitalization and expresses her frustration over this.  Nicole [name] did not know Nicole [name] had a PE and a Blood clot diagnosed on this admission- I explained these findings and the possibility of transient bacteremia- interestingly enough Nicole [name] recently had major dental work with several tooth extractions- possible transient bacteremia as a source.  She had questions about how Nicole [name] got the discitis and was very confused about when they would be taking her for the image guided aspiration and why there had been a delay and is upset that this has delayed antibiotics. She has been "waiting for two days" to day to sign the consent. She feels [name] is having under reported pain from this.   She asks for clarity on why Nicole [name] is having such a difficult time with diarrhea and the the underlying cause.  She reports that no one told her that Nicole [name] was refusing her lovenox shots or medications- she says that Nicole [name] will do anything that Nicole [name] asks her to do and is in general mistrusting of other caregivers- she does not have the mental capacity to refuse medications or understand the repercussions of this- Nicole [name] as her legal guardian request to be contacted when her sister refuses meds- she feels staff have been to quick to assume Nicole [name] is non-adherent or non compliant when really she just doesn't have cognitive ability or insight to make good decisions for her self.  Recommendations:  1. Goals of Care  to restore as much of her baseline health and QOL as possible   If restoring health is not possible to focus on comfort and dignity and always  reduce suffering as much as possible, her sister knows that she may have decline to a place where she cannot recover and that she may not be able to overcome this illness but she wants to make sure Nicole [name] has received the standard of medical care and has every chance to get better if she can and if not she is open to hospice and palliative care options at that  point.  DNR-will sign MOST form  2. Symptom Management  Schedule Tylenol  Oxycodone PRN, use nonverbal pain scale modified-she will likely not endorse pain  She had two loose stools while I was in the room-her diarrhea is a major care challenge and has been going on for several weeks now, CT shows heavy stool burden.  3. Care Plan   IF PATIENT REFUSES MEDICATIONS OR ANY MEDICAL CARE WE MUST CONTACT HER LEGAL GUARDIAN AND MAKE HER AWARE OF THIS  Proceed with IR image guided Disc Aspiration and start IV antibiotics to treat discitis.  Trial of treatment to see if symptoms improve including treating pain and other symptoms which may improve appetite.  Prognostication: At the current time, with available information it is difficult to accurately prognosticate- she has related conditions (discitis, DVT/PE and Colitis) and the clinical information is inconclusive as to etiology of all of these conditions- but it seems possible that she got discitis from transient bacteremia following dental procedure, was more immobile from lumbar symptoms and developed DVT then PE and ischemic colitis which may have also been related to a transient bacteremia. Proceed with a time limited trial of treatment and address goals of care regularly and determine based on goals at what point, if any, a transition to comfort care would be appropriate.  Will continue to follow and communicate with patient's sister regularly.  [name], DO Palliative Medicine [phone]  Time: 70 minutes Greater than 50%  of this time was spent counseling and coordinating care related to the above assessment and plan.

## 2020-06-10 NOTE — Progress Notes (Signed)
PROGRESS NOTE  Nicole [name] KZL:[id number] DOB: 13-Jan-1951 DOA: 05/30/2020 PCP: Vicenta Aly, [name]  HPI/Recap of past 42 hours: 69 year old female with history of seizure disorder, stroke with left hemiparesis, hypertension, hyperlipidemia presents from group home with persistent diarrhea and generalized weakness.  Patient reports she had a prolonged hospital stay at [facility] long for small bowel obstruction and was discharged on the 24th June.  And since then she has been having diarrhea.  C. difficile PCR and GI pathogen negative.  CT of the abdomen and pelvis shows Colonic wall thickening with pericolonic edema and fat stranding involving the distal descending through sigmoid colon consistent with colitis, likely infectious or inflammatory. Pt underwent flex sigmoidoscopy showing ischemic colitis. Rectal tube was removed. She was started on cholestyramine and low fiber diet.  Repeat abdomen and pelvis on 06/07/2020 shows thrombus in the right lower lobe on the lung, no bowel perforation.  She was started on Lovenox injection and venous duplex of the lower extremities revealed age indeterminate DVT involving the right common femoral vein and right proximal profunda vein.  In view of multiple medical problems, pt refusal to take heparin shots for DVT prophylaxis, refusing blood draws, refusing to eat, deconditioning, after extensive discussion with HCPOA, palliative care consulted for goals of care.   Hospital course complicated by CT findings of persistent sclerosis and endplate irregularity at L3-4, no changes since the PET scan done in 2010, difficult to entirely exclude discitis, confirmed by MRI lumbar spine.  ID has been consulted and following.  06/10/20: Seen and examined at her bedside.  She is more alert and interactive this morning.  She denies any abdominal pain, nausea or diarrhea.  States she had 1 solid bowel movement today.  She was started on Cipro Flagyl on 06/09/2020 by GI.  She is  also on cholestyramine.  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   History of stroke with residual deficit (left hemiparesis)   Hypermagnesemia   Hypercalcemia   Hyperkalemia   Hyperglycemia   MRSA carrier   Hyponatremia   Hypokalemia   Anemia of chronic disease   Constipation in female   At risk for aspiration related to tube feeding   Pleural effusion, bilateral   Acute pulmonary embolism (HCC)   Coronary artery disease   Right ovarian cyst   Aortic atherosclerosis (HCC)   Spinal stenosis   Ischemic colitis ([place])   Current non-adherence to medical treatment (refusing blood draws) while in hospital   Right leg DVT ([place])   DNR (do not resuscitate)   Palliative care by specialist   Improving diarrhea secondary to ischemic colitis Seen by GI, post flex sigmoidoscopy/colonoscopy Path report from colonoscopy done on 06/04/2020 showed colonic mucosa and inflamed granulation tissue consistent with ulcer.  The differential diagnosis includes segmental colitis associated with diverticulitis.  Clinical correlation is essential. No diverticulitis seen on CT scan or colonoscopy.  Per GI, unlikely this is related to diverticulitis. C. difficile PCR and GI panel negative  Started on IV Cipro, IV Flagyl, cholestyramine, and gentle IV fluid hydration on 06/09/2020 No reported diarrhea on 7/8.  Acute blood loss likely from GI losses in the setting of chronic normocytic anemia Drop in hemoglobin 8.5 from 9.5K Baseline  hemoglobin 12 Sigmoidoscopy done on 06/04/2020: Altered vascular, congested, friable (with spontaneous bleeding), nodular, plaque covered, ulcerated and vascular-pattern-decreased mucosa in the sigmoid colon and in the descending colon. Biopsied.  Acute pulmonary embolus in the right lower lobe of the lung,(incidental finding on the CT of the abdomen pelvis)/right lower extremity DVT Started the patient on Lovenox injection as patient is refusing blood draws and would not be ideal to being on IV heparin.          Venous duplex of the lower extremities revealed age indeterminate DVT involving            the right common femoral vein and right proximal profunda vein. Continue full dose Lovenox twice daily Continue to monitor CBC  L3-4 discitis on CT scan, confirmed by MRI of lumbar spine 06/07/20 ID recommended aspiration by IR for cultures prior to initiating IV antibiotics Appreciate IDs assistance MRSA screen positive on 05/31/2020 Blood cultures drawn on 05/30/2020 - final. Pain management in place as needed  Seizure disorder Stable, no reported seizure activity Continue home Keppra and carbamazepine  Hyperlipidemia Continue Lipitor  History of CVA Continue statin  GERD Resume PPI  Physical debility PT OT assessment recommended SNF Sister prefers return to group home with home health PT OT TOC assisting with disposition Continue PT OT with assistance and fall precautions   Resolved hypokalemia  Post repletion Serum potassium 4.0   Deep tissue pressure injury of the heels, POA Pressure Injury 06/07/20 Heel Left Unstageable - Full thickness tissue loss in which the base of the injury is covered by slough (yellow, tan, gray, green or brown) and/or eschar (tan, brown or black) in the wound bed. (Active)  06/07/20 0800  Location: Heel  Location Orientation: Left  Staging: Unstageable - Full thickness tissue loss in which the base of the injury is covered by slough (yellow,  tan, gray, green or brown) and/or eschar (tan, brown or black) in the wound bed.  Wound Description (Comments):   Present on Admission:      Pressure Injury 06/07/20 Heel Anterior;Right;Left Stage 1 -  Intact skin with non-blanchable redness of a localized area usually over a bony prominence. (Active)  06/07/20 0800  Location: Heel  Location Orientation: Anterior;Right;Left  Staging: Stage 1 -  Intact skin with non-blanchable redness of a localized area usually over a bony prominence.  Wound Description (Comments):   Present on Admission:   Wound care consulted and recommendations given.   DVT prophylaxis: Lovenox subcu, full dose, twice daily Code Status: DNR Family Communication:  Updated sister at bedside. Disposition:   Status is: Inpatient  Remains inpatient appropriate because:Ongoing diagnostic testing needed, pending this aspiration by IR and initiation of IV antibiotics   Dispo:             Patient From: Home             Planned Disposition: SNF             Expected discharge date: 06/12/20             Medically stable for discharge: No        Consultants:   Gastroenterology.   ID  IR  Procedures: none.   Antimicrobials:  Pending.  Objective: Vitals:   06/09/20 1437 06/09/20 2021 06/10/20 0534 06/10/20 0814  BP: 114/60 (!) 109/49 133/84   Pulse: 96 98 (!) 101 97  Resp: '16 14 16 16  '$ Temp: 98.3 F (36.8 C) 100.3 F (37.9 C) 98.2 F (36.8 C)   TempSrc: Oral Oral Oral   SpO2: 100% 98% 95% 95%  Weight:      Height:        Intake/Output Summary (Last 24 hours) at 06/10/2020 1355 Last data filed at 06/10/2020 1016 Gross per 24 hour  Intake 841.86 ml  Output 2150 ml  Net -1308.14 ml   Filed Weights   06/04/20 0500 06/04/20 1340 06/05/20 0500  Weight: 94.1 kg 94.1 kg 88.6 kg    Exam:  . General: 69 y.o. year-old female frail-appearing no acute distress.  Alert and interactive.   . Cardiovascular: Regular rate and rhythm no  rubs or gallops.   Marland Kitchen Respiratory: Clear to auscultation with no wheezes or rales.  Poor inspiratory effort. . Abdomen: Obese nontender normal bowel sounds present.   . Musculoskeletal: Trace lower extremity edema bilaterally.   Marland Kitchen Psychiatry: Mood is appropriate for condition and setting.  Data Reviewed: CBC: Recent Labs  Lab 06/04/20 1241 06/08/20 0721 06/10/20 0030  WBC 4.9 5.0 6.8  NEUTROABS 3.9  --  4.7  HGB 10.1* 9.5* 8.5*  HCT 31.9* 29.7* 26.5*  MCV 99.7 98.3 98.9  PLT 391 355 518   Basic Metabolic Panel: Recent Labs  Lab 06/04/20 1241 06/10/20 0030  NA 137 132*  K 4.0 4.0  CL 100 99  CO2 29 25  GLUCOSE 108* 124*  BUN 6* 10  CREATININE 0.47 0.45  CALCIUM 9.5 8.6*  MG  --  1.8  PHOS  --  2.7   GFR: Estimated Creatinine Clearance: 74.8 mL/min (by C-G formula based on SCr of 0.45 mg/dL). Liver Function Tests: No results for input(s): AST, ALT, ALKPHOS, BILITOT, PROT, ALBUMIN in the last 168 hours. No results for input(s): LIPASE, AMYLASE in the last 168 hours. No results for input(s): AMMONIA in the last 168 hours. Coagulation Profile: No results for input(s): INR, PROTIME in the last 168 hours. Cardiac Enzymes: No results for input(s): CKTOTAL, CKMB, CKMBINDEX, TROPONINI in the last 168 hours. BNP (last 3 results) No results for input(s): PROBNP in the last 8760 hours. HbA1C: No results for input(s): HGBA1C in the last 72 hours. CBG: No results for input(s): GLUCAP in the last 168 hours. Lipid Profile: No results for input(s): CHOL, HDL, LDLCALC, TRIG, CHOLHDL, LDLDIRECT in the last 72 hours. Thyroid Function Tests: No results for input(s): TSH, T4TOTAL, FREET4, T3FREE, THYROIDAB in the last 72 hours. Anemia Panel: No results for input(s): VITAMINB12, FOLATE, FERRITIN, TIBC, IRON, RETICCTPCT in the last 72 hours. Urine analysis:    Component Value Date/Time   COLORURINE AMBER (A) 05/31/2020 1005   APPEARANCEUR HAZY (A) 05/31/2020 1005   LABSPEC 1.045  (H) 05/31/2020 1005   PHURINE 5.0 05/31/2020 1005   GLUCOSEU NEGATIVE 05/31/2020 1005   HGBUR MODERATE (A) 05/31/2020 1005   BILIRUBINUR NEGATIVE 05/31/2020 1005   KETONESUR NEGATIVE 05/31/2020 1005   PROTEINUR NEGATIVE 05/31/2020 1005   NITRITE NEGATIVE 05/31/2020 1005   LEUKOCYTESUR MODERATE (A) 05/31/2020 1005   Sepsis Labs: '@LABRCNTIP'$ (procalcitonin:4,lacticidven:4)  ) Recent Results (from the past 240 hour(s))  Gastrointestinal Panel by PCR , Stool     Status: None   Collection Time: 06/01/20 11:45 AM   Specimen: Stool  Result Value Ref Range Status  Campylobacter species NOT DETECTED NOT DETECTED Final   Plesimonas shigelloides NOT DETECTED NOT DETECTED Final   Salmonella species NOT DETECTED NOT DETECTED Final   Yersinia enterocolitica NOT DETECTED NOT DETECTED Final   Vibrio species NOT DETECTED NOT DETECTED Final   Vibrio cholerae NOT DETECTED NOT DETECTED Final   Enteroaggregative E coli (EAEC) NOT DETECTED NOT DETECTED Final   Enteropathogenic E coli (EPEC) NOT DETECTED NOT DETECTED Final   Enterotoxigenic E coli (ETEC) NOT DETECTED NOT DETECTED Final   Shiga like toxin producing E coli (STEC) NOT DETECTED NOT DETECTED Final   Shigella/Enteroinvasive E coli (EIEC) NOT DETECTED NOT DETECTED Final   Cryptosporidium NOT DETECTED NOT DETECTED Final   Cyclospora cayetanensis NOT DETECTED NOT DETECTED Final   Entamoeba histolytica NOT DETECTED NOT DETECTED Final   Giardia lamblia NOT DETECTED NOT DETECTED Final   Adenovirus F40/41 NOT DETECTED NOT DETECTED Final   Astrovirus NOT DETECTED NOT DETECTED Final   Norovirus GI/GII NOT DETECTED NOT DETECTED Final   Rotavirus A NOT DETECTED NOT DETECTED Final   Sapovirus (I, II, IV, and V) NOT DETECTED NOT DETECTED Final    Comment: Performed at [facility], [address] [place], [place] [address]  C Difficile Quick Screen w PCR reflex     Status: None   Collection Time: 06/01/20 11:45 AM   Specimen: STOOL    Result Value Ref Range Status   C Diff antigen NEGATIVE NEGATIVE Final   C Diff toxin NEGATIVE NEGATIVE Final   C Diff interpretation No C. difficile detected.  Final    Comment: Performed at [facility], [place] [address], [place], [place] [address]      Studies: No results found.  Scheduled Meds: . acetaminophen  1,000 mg Oral TID  . atorvastatin  10 mg Oral Q M,W,F  . budesonide  0.25 mg Inhalation BID  . calcium carbonate  1 tablet Oral TID WC  . carbamazepine  300 mg Oral BID  . cholestyramine  4 g Oral TID  . diclofenac  75 mg Oral BID  . enoxaparin (LOVENOX) injection  90 mg Subcutaneous Q12H  . feeding supplement  1 Container Oral TID BM  . feeding supplement (PRO-STAT SUGAR FREE 64)  30 mL Oral TID  . gabapentin  600 mg Oral QHS  . levETIRAcetam  500 mg Oral BID  . mouth rinse  15 mL Mouth Rinse BID  . multivitamin with minerals  1 tablet Oral Daily  . mupirocin ointment   Nasal BID  . pantoprazole  40 mg Oral Daily  . potassium chloride  40 mEq Oral Daily  . sodium chloride flush  3 mL Intravenous Q12H    Continuous Infusions: . sodium chloride 10 mL/hr at 06/03/20 0800  . sodium chloride Stopped (05/31/20 1259)  . sodium chloride    . ciprofloxacin    . metronidazole       LOS: 10 days     Kayleen Memos, MD [facility] Pager [phone]  If 7PM-7AM, please contact night-coverage [email or web address] Password [email or web address] 06/10/2020, 1:55 PM

## 2020-06-10 NOTE — Progress Notes (Signed)
[facility]  Date of Admission:  05/30/2020     Total days of antibiotics 5         ASSESSMENT:  Ms. [place] is awaiting IR aspiration to obtain cultures to direct antimicrobial therapy for lumbar discitis/osteomyelitis. Will continue with ciprofloxacin/metronidazole for ischemic colitis. This will however likely reduce the chances of obtaining any organisms for culture.  Discussed plan of care with sister who was present in the room. Await IR aspiration.   PLAN:  1. Continue ciprofloxacin and metronidazole for ischemic colitis. 2. Aspiration per IR to obtain cultures to direct antibiotic therapy.   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   History of stroke with residual deficit (left hemiparesis)   Hypermagnesemia   Hypercalcemia   Hyperkalemia   Hyperglycemia   MRSA carrier   Hyponatremia   Hypokalemia   Anemia of chronic disease   Constipation in female   At risk for aspiration related to tube feeding   Pleural effusion, bilateral   Acute pulmonary embolism (HCC)   Coronary artery disease   Right ovarian cyst   Aortic atherosclerosis ([place])   Spinal stenosis   Ischemic colitis ([place])   Current non-adherence to medical treatment (refusing blood draws) while in hospital   Right leg DVT ([place])   DNR (do not resuscitate)   Palliative care by specialist   . acetaminophen  1,000 mg Oral TID  . atorvastatin  10 mg Oral Q M,W,F  . budesonide  0.25 mg Inhalation BID  . calcium carbonate  1  tablet Oral TID WC  . carbamazepine  300 mg Oral BID  . cholestyramine  4 g Oral TID  . diclofenac  75 mg Oral BID  . enoxaparin (LOVENOX) injection  90 mg Subcutaneous Q12H  . feeding supplement  1 Container Oral TID BM  . feeding supplement (PRO-STAT SUGAR FREE 64)  30 mL Oral TID  . gabapentin  600 mg Oral QHS  . levETIRAcetam  500 mg Oral BID  . mouth rinse  15 mL Mouth Rinse BID  . multivitamin with minerals  1 tablet Oral Daily  . mupirocin ointment   Nasal BID  . pantoprazole  40 mg Oral Daily  . potassium chloride  40 mEq Oral Daily  . sodium chloride flush  3 mL Intravenous Q12H    SUBJECTIVE:  Elevated temperature of 100.3 yesterday. Sister says she is looking and feeling better today compared to yesterday. Continues to have diarrhea.  Allergies  Allergen Reactions  . Seasonal Ic [Cholestatin]     Runny nose     Review of Systems: Review of Systems  Constitutional: Negative for chills, fever and weight loss.  Respiratory: Negative for cough, shortness of breath and wheezing.   Cardiovascular: Negative for chest pain and leg swelling.  Gastrointestinal: Positive for diarrhea. Negative for abdominal pain, constipation, nausea and vomiting.  Skin: Negative for rash.      OBJECTIVE: Vitals:   06/09/20 1437 06/09/20 2021 06/10/20  0534 06/10/20 0814  BP: 114/60 (!) 109/49 133/84   Pulse: 96 98 (!) 101 97  Resp: _0 Temp: 98.3 F (36.8 C) 100.3 F (37.9 C) 98.2 F (36.8 C)   TempSrc: Oral Oral Oral   SpO2: 100% 98% 95% 95%  Weight:      Height:       Body mass index is 30.59 kg/m.  Physical Exam Constitutional:      General: She is not in acute distress.    Appearance: She is well-developed.  Cardiovascular:     Rate and Rhythm: Normal rate and regular rhythm.     Heart sounds: Normal heart sounds.  Pulmonary:     Effort: Pulmonary effort is normal.     Breath sounds: Normal breath sounds.  Skin:    General: Skin is warm and dry.    Neurological:     Mental Status: She is alert.     Lab Results Lab Results  Component Value Date   WBC 6.8 06/10/2020   HGB 8.5 (L) 06/10/2020   HCT 26.5 (L) 06/10/2020   MCV 98.9 06/10/2020   PLT 368 06/10/2020    Lab Results  Component Value Date   CREATININE 0.45 06/10/2020   BUN 10 06/10/2020   NA 132 (L) 06/10/2020   K 4.0 06/10/2020   CL 99 06/10/2020   CO2 25 06/10/2020    Lab Results  Component Value Date   ALT 21 05/31/2020   AST 29 05/31/2020   ALKPHOS 34 (L) 05/31/2020   BILITOT 0.5 05/31/2020     Microbiology: Recent Results (from the past 240 hour(s))  MRSA PCR Screening     Status: Abnormal   Collection Time: 05/31/20 11:46 AM   Specimen: Nasopharyngeal  Result Value Ref Range Status   MRSA by PCR POSITIVE (A) NEGATIVE Final    Comment:        The GeneXpert MRSA Assay (FDA approved for NASAL specimens only), is one component of a comprehensive MRSA colonization surveillance program. It is not intended to diagnose MRSA infection nor to guide or monitor treatment for MRSA infections. CRITICAL VALUE NOTED.  VALUE IS CONSISTENT WITH PREVIOUSLY REPORTED AND CALLED VALUE. Performed at [facility], [place] [address], [place], [place] [address]   Gastrointestinal Panel by PCR , Stool     Status: None   Collection Time: 06/01/20 11:45 AM   Specimen: Stool  Result Value Ref Range Status   Campylobacter species NOT DETECTED NOT DETECTED Final   Plesimonas shigelloides NOT DETECTED NOT DETECTED Final   Salmonella species NOT DETECTED NOT DETECTED Final   Yersinia enterocolitica NOT DETECTED NOT DETECTED Final   Vibrio species NOT DETECTED NOT DETECTED Final   Vibrio cholerae NOT DETECTED NOT DETECTED Final   Enteroaggregative E coli (EAEC) NOT DETECTED NOT DETECTED Final   Enteropathogenic E coli (EPEC) NOT DETECTED NOT DETECTED Final   Enterotoxigenic E coli (ETEC) NOT DETECTED NOT DETECTED Final   Shiga like toxin producing E coli (STEC) NOT  DETECTED NOT DETECTED Final   Shigella/Enteroinvasive E coli (EIEC) NOT DETECTED NOT DETECTED Final   Cryptosporidium NOT DETECTED NOT DETECTED Final   Cyclospora cayetanensis NOT DETECTED NOT DETECTED Final   Entamoeba histolytica NOT DETECTED NOT DETECTED Final   Giardia lamblia NOT DETECTED NOT DETECTED Final   Adenovirus F40/41 NOT DETECTED NOT DETECTED Final   Astrovirus NOT DETECTED NOT DETECTED Final   Norovirus GI/GII NOT DETECTED NOT DETECTED Final   Rotavirus A  NOT DETECTED NOT DETECTED Final   Sapovirus (I, II, IV, and V) NOT DETECTED NOT DETECTED Final    Comment: Performed at [facility], [place], [place] [address]  C Difficile Quick Screen w PCR reflex     Status: None   Collection Time: 06/01/20 11:45 AM   Specimen: STOOL  Result Value Ref Range Status   C Diff antigen NEGATIVE NEGATIVE Final   C Diff toxin NEGATIVE NEGATIVE Final   C Diff interpretation No C. difficile detected.  Final    Comment: Performed at [facility], [place] [address] [place], [place] [address]     Terri Piedra, [facility]  06/10/2020  11:22 AM

## 2020-06-10 NOTE — Consult Note (Signed)
Chief Complaint: Patient was seen in consultation today for discitis/osteomyelitis  Referring Physician(s): Dr. Luciana Axe  Supervising Physician: Baldemar Lenis  Patient Status: [facility] - In-pt  History of Present Illness: Nicole [name] is a 69 y.o. female with diarrhea and weakness found to have ischemic colitis identified by CT Abdomen Pelvis 05/31/20. CT imaging also showed L3-L4 end plate irregularity and outpatient work-up was planned. Repeat imaging 06/07/20 obtained due to increased abdominal distention. Repeat CT Abdomen Pelvis 7/5 showed new RLL and ongoing L3-L4 abnormality.  An MR Lumbar Spine was also obtained and showed: At L3-L4, there is advanced disc space narrowing with fluid signal in the disc space. Prominent endplate irregularity with mild L3 and L4 vertebral body height loss. There is enhancement along the disc margins and adjacent endplates. Findings are suspicious for possible discitis/osteomyelitis. Also at this level, there is a disc bulge. Disc osteophyte ridge which is most prominent within the central through left subarticular zones. Advanced facet arthrosis with ligamentum flavum hypertrophy. Severe central and left subarticular stenosis. Bilateral neural foraminal narrowing (moderate right, severe left).  ID was consulted and recommended aspiration for possible discitis/osteomyelitis.  Complicating the patient's treatment course is her history of cognitive/developmental delay manifesting in refusal of care, labs, and treatment.  A Palliative Care consult was requested 7/5 by primary team and IR on stand-by pending outcome prior to proceeding with procedure.  After meeting 06/10/20, patient's sister would like to move forward with disc aspiration.  Case reviewed and approved by Dr. Tommie Sams.  Patient has not been NPO today.  She has been placed on lovenox.   Past Medical History:  Diagnosis Date   Abnormal WBC count    low chronic  WBC (2-3K)   Allergic rhinitis    Alopecia    Discoid lupus    Hyperlipidemia    Hypertension    Menopause    Moderate mental retardation    Obesity    Osteoporosis    Pulmonary nodules    Seizures (HCC)    focal-being by Dr. Sandria Manly (should not take anti-histamines)    Past Surgical History:  Procedure Laterality Date   BIOPSY  06/04/2020   Procedure: BIOPSY;  Surgeon: Willis Modena, MD;  Location: [facility] ENDOSCOPY;  Service: Endoscopy;;   FLEXIBLE SIGMOIDOSCOPY N/A 06/04/2020   Procedure: Arnell Sieving;  Surgeon: Willis Modena, MD;  Location: [facility] ENDOSCOPY;  Service: Endoscopy;  Laterality: N/A;   VESICOVAGINAL FISTULA CLOSURE W/ TAH      Allergies: Seasonal ic [cholestatin]  Medications: Prior to Admission medications   Medication Sig Start Date End Date Taking? Authorizing Provider  atorvastatin (LIPITOR) 10 MG tablet Take 10 mg by mouth every Monday, Wednesday, and Friday.  12/08/19  Yes [provider]  budesonide (PULMICORT FLEXHALER) 180 MCG/ACT inhaler Inhale 1 puff into the lungs 2 (two) times daily. 09/10/19  Yes [provider]  busPIRone (BUSPAR) 15 MG tablet Take 15 mg by mouth 2 (two) times daily.     Yes [provider]  calcium carbonate (OS-CAL) 600 MG TABS tablet Take 600 mg by mouth 3 (three) times daily with meals.    Yes [provider]  carbamazepine (TEGRETOL XR) 100 MG 12 hr tablet Take 3 tablets (300 mg total) by mouth 2 (two) times daily. Reducing due to low sodium levels recently. Patient taking differently: Take 300 mg by mouth 2 (two) times daily.  04/10/18  Yes Huston Foley, MD  Cholecalciferol (VITAMIN D3) 2000 units TABS Take 2,000  Units by mouth daily.    Yes [provider]  diclofenac (VOLTAREN) 75 MG EC tablet Take 75 mg by mouth 2 (two) times daily.  07/28/16  Yes [provider]  gabapentin (NEURONTIN) 300 MG capsule Take 1 capsule (300 mg total) by mouth in the morning, at  noon, and at bedtime. Taken tid on weekends, taken qid on weekdays. Patient taking differently: Take 300 mg by mouth in the morning, at noon, and at bedtime.  05/27/20  Yes Standley Brooking, MD  levETIRAcetam (KEPPRA) 500 MG tablet Take 1 tablet (500 mg total) by mouth 2 (two) times daily. 04/10/18  Yes Huston Foley, MD  Multiple Vitamins-Minerals (CERTAVITE/ANTIOXIDANTS) TABS Take 1 tablet by mouth daily. Advance , daily    Yes [provider]  pantoprazole (PROTONIX) 40 MG tablet Take 40 mg by mouth daily.  08/04/13  Yes [provider]  Selenium 200 MCG TBCR Take 1 tablet by mouth daily.   Yes [provider]  Wheat Dextrin (BENEFIBER) CHEW Chew 1 tablet by mouth daily.    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)  Tobacco Use   Smoking status: Never Smoker   Smokeless tobacco: Never Used  Substance and Sexual Activity   Alcohol use: No   Drug use: No   Sexual activity: Never  Other Topics Concern   Not on file  Social History Narrative   [employer] [phone]   Social Determinants of Health   Financial Resource Strain:    Difficulty of Paying Living Expenses:   Food Insecurity:    Worried About Programme researcher, broadcasting/film/video in the Last Year:    Barista in the Last Year:   Transportation Needs:    Freight forwarder (Medical):    Lack of Transportation (Non-Medical):   Physical Activity:    Days of Exercise per Week:    Minutes of Exercise per Session:   Stress:    Feeling of Stress :   Social Connections:    Frequency  of Communication with Friends and Family:    Frequency of Social Gatherings with Friends and Family:    Attends Religious Services:    Active Member of Clubs or Organizations:    Attends Banker Meetings:    Marital Status:      Review of Systems: A 12 point ROS discussed and pertinent positives are indicated in the HPI above.  All other systems are negative.  Review of Systems  Constitutional: Negative for fatigue and fever.  Respiratory: Negative for cough and shortness of breath.   Cardiovascular: Negative for chest pain.  Gastrointestinal: Negative for abdominal pain.  Genitourinary: Negative for dysuria.  Musculoskeletal: Negative for back pain.  Psychiatric/Behavioral: Negative for behavioral problems and confusion.    Vital Signs: BP 133/84 (BP Location: Right Arm)    Pulse 97    Temp 98.2 F (36.8 C) (Oral)    Resp 16    Ht 5\' 7"  (1.702 m)    Wt  195 lb 5.2 oz (88.6 kg)    SpO2 95%    BMI 30.59 kg/m   Physical Exam Vitals and nursing note reviewed.  Constitutional:      General: She is not in acute distress.    Appearance: Normal appearance.  HENT:     Mouth/Throat:     Mouth: Mucous membranes are moist.     Pharynx: Oropharynx is clear.  Cardiovascular:     Rate and Rhythm: Normal rate and regular rhythm.  Pulmonary:     Effort: Pulmonary effort is normal. No respiratory distress.     Breath sounds: Normal breath sounds.  Skin:    General: Skin is warm and dry.  Neurological:     General: No focal deficit present.     Mental Status: She is alert and oriented to person, place, and time. Mental status is at baseline.  Psychiatric:        Mood and Affect: Mood normal.        Behavior: Behavior normal.        Thought Content: Thought content normal.        Judgment: Judgment normal.      MD Evaluation Airway: WNL Heart: WNL Abdomen: WNL (mild distention, non tender, no peritonitis) Chest/ Lungs: WNL ASA  Classification: Per MD or  Designee Mallampati/Airway Score: Two   Imaging: DG Chest 2 View  Result Date: 06/07/2020 CLINICAL DATA:  Follow-up. EXAM: CHEST - 2 VIEW COMPARISON:  06/01/2019 FINDINGS: Lungs are adequately inflated demonstrate subtle hazy prominence of the central bronchovascular markings. Mild hazy density over the left retrocardiac region which may be due to layering effusion/atelectasis. Cardiomediastinal silhouette and remainder of the exam is unchanged. IMPRESSION: Minimal opacification in the left retrocardiac region which may be due to layering effusion/atelectasis. Mild stable prominence of the central bronchovascular markings which may be due to mild vascular congestion. Electronically Signed   By: Elberta Fortis M.D.   On: 06/07/2020 17:19   DG Abd 1 View  Result Date: 06/06/2020 CLINICAL DATA:  Nausea vomiting. EXAM: ABDOMEN - 1 VIEW COMPARISON:  05/31/2020 CT evaluation and prior plain film evaluation FINDINGS: Abundant stool is present in the ascending colon. Some suggestion of haustral thickening particularly in the cecum. Pattern of distension is similar to previous imaging. Spinal degenerative changes with similar appearance to prior studies greatest in the mid lumbar spine. IMPRESSION: Abundant stool in the ascending colon with suggestion of haustral thickening particularly in the cecum. Haustral thickening likely reflecting colitis in this patient with known colitis. No change in colonic distension compared to recent imaging. Electronically Signed   By: Donzetta Kohut M.D.   On: 06/06/2020 13:24   MR Lumbar Spine W Wo Contrast  Result Date: 06/07/2020 CLINICAL DATA:  Low back pain, no red flags. EXAM: MRI LUMBAR SPINE WITHOUT AND WITH CONTRAST TECHNIQUE: Multiplanar and multiecho pulse sequences of the lumbar spine were obtained without and with intravenous contrast. CONTRAST:  8.46mL GADAVIST GADOBUTROL 1 MMOL/ML IV SOLN COMPARISON:  CT abdomen/pelvis 06/07/2020, CT abdomen/pelvis 05/31/2020  FINDINGS: There is mild motion degradation of multiple sequences. Segmentation: 5 lumbar vertebrae are assumed. The caudal most well-formed intervertebral disc is designated L5-S1. Alignment: Mild L3-L4 right lateral listhesis was better appreciated on CT abdomen/pelvis performed earlier the same day. Vertebrae: Redemonstrated prominent endplate irregularity at L3-L4 with mild L3 and L4 vertebral body height loss. Vertebral body height is otherwise maintained. There is diffuse nonspecific heterogeneous marrow signal. Multilevel degenerative endplate irregularity and predominantly fatty degenerative endplate marrow  signal. Conus medullaris and cauda equina: Conus extends to the L1-L2 level. No signal abnormality within the visualized distal spinal cord. No definite abnormal enhancement within the spinal canal. Paraspinal and other soft tissues: No abnormality identified within included portions of the abdomen/retroperitoneum. Redemonstrated nonspecific presacral edema. Disc levels: Multilevel disc degeneration. Most notably there is advanced L3-L4 disc degeneration and moderate disc degeneration at L4-L5 and L5-S1. T12-L1: Facet arthrosis. No significant disc herniation or stenosis. L1-L2: Minimal disc bulge. Mild facet arthrosis. No significant disc herniation or stenosis. L2-L3: Minimal disc bulge with endplate osteophytes. Moderate facet arthrosis. No significant spinal canal stenosis. Mild/moderate left neural foraminal narrowing. L3-L4: Advanced disc space narrowing with fluid signal in the disc space. Prominent endplate irregularity. There is enhancement along the disc margins and endplates. Disc bulge with osteophyte ridge. Disc osteophyte ridge is most prominent within the central through left subarticular zones. Advanced facet arthrosis with ligamentum flavum hypertrophy. Severe central and left subarticular stenosis. Moderate right with severe left neural foraminal narrowing. L4-L5: Disc bulge. Superimposed  broad-based right center to right foraminal disc protrusion. Osteophyte ridge greatest along the right lateral aspect of the disc space. Advanced facet arthrosis (greater on the right) with ligamentum flavum hypertrophy. Severe right subarticular narrowing with potential to affect the descending right L5 nerve root. Moderate central canal stenosis. Severe right with moderate left neural foraminal narrowing. L5-S1: Disc bulge asymmetric to the right. Associated osteophyte ridge greatest along the right lateral aspect of the disc space. Moderate/advanced facet arthrosis with ligamentum flavum hypertrophy. Moderate right subarticular narrowing with crowding of the descending right S1 nerve root. Central canal patent. Bilateral neural foraminal narrowing (severe right, mild left). Impression #1 will be called to the ordering clinician or representative by the Radiologist Assistant, and communication documented in the PACS or [employer]. IMPRESSION: At L3-L4, there is advanced disc space narrowing with fluid signal in the disc space. Prominent endplate irregularity with mild L3 and L4 vertebral body height loss. There is enhancement along the disc margins and adjacent endplates. Findings are suspicious for possible discitis/osteomyelitis. Also at this level, there is a disc bulge. Disc osteophyte ridge which is most prominent within the central through left subarticular zones. Advanced facet arthrosis with ligamentum flavum hypertrophy. Severe central and left subarticular stenosis. Bilateral neural foraminal narrowing (moderate right, severe left). Additional levels of lumbar spondylosis, most notably as follows. At L4-L5, there is multifactorial severe right subarticular narrowing with potential to affect the descending right L5 nerve root. Moderate central canal stenosis. Bilateral neural foraminal narrowing (severe right, moderate left). At L5-S1, there is multifactorial moderate right subarticular narrowing  with crowding of the descending right S1 nerve root. Bilateral neural foraminal narrowing (severe right, mild left). Diffuse nonspecific heterogeneous marrow signal. Redemonstrated nonspecific mild presacral edema. Electronically Signed   By: Jackey Loge DO   On: 06/07/2020 20:46   CT ABDOMEN PELVIS W CONTRAST  Result Date: 06/07/2020 CLINICAL DATA:  Abdominal distention. Nausea, vomiting. Bowel obstruction suspected. EXAM: CT ABDOMEN AND PELVIS WITH CONTRAST TECHNIQUE: Multidetector CT imaging of the abdomen and pelvis was performed using the standard protocol following bolus administration of intravenous contrast. CONTRAST:  OMNIPAQUE IOHEXOL 350 MG/ML SOLN COMPARISON:  Two-view abdomen on 06/07/2020, CT of the abdomen and pelvis on 05/31/2020 FINDINGS: Lower chest: Thrombus is identified within the RIGHT LOWER lobe pulmonary artery and branches. The pulmonary arteries are not fully evaluated on this study of the abdomen. There are small bilateral pleural effusions associated with bibasilar atelectasis. A 4  millimeter nodule in the RIGHT middle lobe is adjacent to the minor fissure and likely represents intrafissural lymph node, not previously imaged. Coronary artery calcifications and coronary stent. Heart size is normal. No pericardial effusion. Hepatobiliary: Cholecystectomy.  Liver is unremarkable. Pancreas: Unremarkable. No pancreatic ductal dilatation or surrounding inflammatory changes. Spleen: Normal in size without focal abnormality. Adrenals/Urinary Tract: Adrenal glands are normal in appearance. A subcentimeter low-attenuation lesion within the midpole region of the LEFT kidney likely represents a cyst but is too small fully characterize. No hydronephrosis. The ureters are unremarkable. Stomach/Bowel: Stomach is normal in appearance. Small bowel loops are normal in caliber and wall thickness. Appendix is normal. There is colonic wall thickening, mucosal enhancement of the descending and  sigmoid colon. The ascending and transverse colon have normal wall thickness. There is significant stool burden. Vascular/Lymphatic: There is minimal atherosclerotic calcification of the abdominal aorta. No aneurysm. Multiple small retroperitoneal lymph nodes appear stable. Reproductive: Hysterectomy. Low-attenuation in the RIGHT adnexal region is 1.4 centimeters, stable in appearance. LEFT adnexal region is unremarkable. Other: There is persistent presacral edema. Musculoskeletal: Significant sclerosis and endplate irregularity at L3-4, similar to the previous exam. These changes are new since the PET scan performed in 2010 and although chronic, is difficult to entirely exclude discitis. There are significant degenerative changes throughout the lumbar spine with disc height loss and facet hypertrophy at multiple levels. Remote sacral fracture. No acute fracture. IMPRESSION: 1. 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 The pulmonary arteries are not fully evaluated on this study of the abdomen. 2. Small bilateral pleural effusions associated with bibasilar atelectasis. 3. Coronary artery disease. 4. Persistent changes of colitis involving the descending and sigmoid colon, slightly improved since prior study. Significant stool burden. 5. Persistent sclerosis and endplate irregularity at L3-4, similar to the previous exam. These changes are new since the PET scan performed in 2010 and although chronic, is difficult to entirely exclude discitis. Recommend further evaluation with MRI of the lumbar spine with and without contrast. 6. Stable appearance of presacral edema, of uncertain significance. MRI of the lumbar spine may help characterize further. 7. Stable appearance of 1.4 centimeter RIGHT ovarian cyst. Recommend follow-up pelvic ultrasound in 6 months. 8. Remote sacral fracture. 9. Aortic Atherosclerosis (ICD10-I70.0). Critical Value/emergent results were called by telephone at the time  of interpretation on 06/07/2020 at 6:18 pm to provider Kathlen Mody , who verbally acknowledged these results. Electronically Signed   By: Norva Pavlov M.D.   On: 06/07/2020 18:19   CT ABDOMEN PELVIS W CONTRAST  Result Date: 05/31/2020 CLINICAL DATA:  Abdominal abscess/infection suspected Diarrhea. Patient with history of vesicle vaginal fistula closure. Recent bowel obstruction. EXAM: CT ABDOMEN AND PELVIS WITH CONTRAST TECHNIQUE: Multidetector CT imaging of the abdomen and pelvis was performed using the standard protocol following bolus administration of intravenous contrast. CONTRAST:  OMNIPAQUE IOHEXOL 300 MG/ML  SOLN COMPARISON:  Multiple recent radiographs. FINDINGS: Lower chest: Small bilateral pleural effusions with compressive atelectasis. Mild wall thickening of the distal esophagus. Small amount of contrast in the distal esophagus. Hepatobiliary: No focal liver abnormality is seen. No gallstones, gallbladder wall thickening, or biliary dilatation. Pancreas: No ductal dilatation or inflammation. Spleen: Normal in size without focal abnormality. Splenule at the hilum. Adrenals/Urinary Tract: Mild left adrenal thickening without dominant nodule. Normal right adrenal gland. No hydronephrosis or perinephric edema. Homogeneous renal enhancement with symmetric excretion on delayed phase imaging. There is some early excretion of IV contrast on initial exam. Subcentimeter low-density lesion  in the upper left kidney is too small to characterize but likely cyst. Urinary bladder is partially distended. There is excreted IV contrast in the dependent urinary bladder. Stomach/Bowel: There is colonic wall thickening with pericolonic stranding involving the distal descending through sigmoid colon. Small amount of liquid stool in the distal colon. There is a moderate volume of stool in the remainder of the colon. There is mild transverse colonic redundancy. The appendix is not definitively visualized. There is  no small bowel dilatation or evidence of obstruction. Stomach is unremarkable. Vascular/Lymphatic: Aortic atherosclerosis. No aortic aneurysm. Patent portal vein. There multiple prominent retroperitoneal nodes are all subcentimeter short axis. Reproductive: Post hysterectomy. Left ovary appears in situ and quiescent. 15 mm fluid density structure in the right adnexa is likely an ovarian cyst. Other: Pericolonic edema and fat stranding from the descending through the sigmoid colon. Small amount of free fluid in both pericolic gutters, left anterior pelvis, presacral region. There is presacral edema. 15 mm fluid density structure in the right pelvis is likely an ovarian cyst, however no prior exams are available to confirm chronicity, and there is mild adjacent stranding. No free intra-abdominal air. Musculoskeletal: L3-L4 endplate sclerosis with endplate irregularity and erosions. Slight lateral translation of L3 on L4. mild L4 superior endplate compression fracture. Anterior osteophytes that are fragmented at this level. There is severe canal stenosis at this level. Probable remote sacral fracture. Additional endplate spurring and degenerative disc disease with facet hypertrophy throughout the lumbar spine. IMPRESSION: 1. Colonic wall thickening with pericolonic edema and fat stranding involving the distal descending through sigmoid colon consistent with colitis, likely infectious or inflammatory. 2. Small amount of free fluid in both pericolic gutters, left anterior pelvis, and presacral region, likely reactive. There is a 15 mm fluid density structure in the right adnexa, felt to represent a small ovarian cyst, however there is mild adjacent stranding in this area and no prior exams are available to confirm chronicity. Small abscess is difficult to exclude, but felt less likely. 3. Small bilateral pleural effusions with compressive atelectasis. 4. L3-L4 endplate sclerosis with endplate irregularity and erosions.  No prior exams are available for comparison to establish chronicity. Given there multiple prominent retroperitoneal lymph nodes and presacral edema, consider further evaluation with lumbar spine MRI. 5. Mild wall thickening of the distal esophagus containing a small amount of enteric contrast, suggesting reflux. Aortic Atherosclerosis (ICD10-I70.0). Electronically Signed   By: Narda Rutherford M.D.   On: 05/31/2020 01:49   DG CHEST PORT 1 VIEW  Result Date: 05/31/2020 CLINICAL DATA:  Hypoxia EXAM: PORTABLE CHEST 1 VIEW COMPARISON:  None. FINDINGS: The heart size and mediastinal contours are within normal limits. Both lungs are clear. The visualized skeletal structures are unremarkable. IMPRESSION: No acute airspace disease. Electronically Signed   By: Deatra Robinson M.D.   On: 05/31/2020 01:29   DG CHEST PORT 1 VIEW  Result Date: 05/19/2020 CLINICAL DATA:  Nasogastric tube placement. EXAM: PORTABLE CHEST 1 VIEW COMPARISON:  Same day. FINDINGS: The heart size and mediastinal contours are within normal limits. Stable bibasilar atelectasis or infiltrates are noted. Nasogastric tube tip is seen in the expected position of the proximal stomach. The visualized skeletal structures are unremarkable. IMPRESSION: Nasogastric tube tip seen in expected position of the proximal stomach. Electronically Signed   By: Lupita Raider M.D.   On: 05/19/2020 14:11   DG CHEST PORT 1 VIEW  Result Date: 05/19/2020 CLINICAL DATA:  Cough and shortness of breath EXAM: PORTABLE CHEST 1  VIEW COMPARISON:  February 06, 2013 FINDINGS: There is a shallow degree of inspiration. There is ill-defined airspace opacity in the lung bases, concerning for a degree of pneumonia in the bases. Lungs elsewhere clear. Heart size and pulmonary vascular normal. No adenopathy. There is arthropathy in the left shoulder. IMPRESSION: Shallow degree of inspiration with ill-defined airspace opacity concerning for pneumonia in the bases. The degree of shallow  inspiration raises concern for a degree of underlying restrictive disease. Lungs elsewhere clear.  Cardiac silhouette normal.  No adenopathy. Electronically Signed   By: Bretta Bang III M.D.   On: 05/19/2020 12:00   DG Abd 2 Views  Result Date: 06/07/2020 CLINICAL DATA:  Vomiting EXAM: ABDOMEN - 2 VIEW COMPARISON:  Prior abdominal radiograph from 06/06/2020 FINDINGS: Colonic distension with stool in the ascending colon. Relatively under distended distal colon. Increasing gastric distension. On the upright or semi upright radiograph there is more lucency over the liver than expected, potentially related artifact though of uncertain significance. LEFT lower lobe airspace disease in the retrocardiac region. Marked spinal degenerative changes. IMPRESSION: 1. Increasing gastric distension. 2. Colonic distension with stool in the ascending colon. 3. Lucency over the liver on the upright or upright radiograph, potentially related artifact though cannot exclude pneumoperitoneum. CT may be helpful for further assessment 4. LEFT lower lobe airspace disease. These results were called by telephone at the time of interpretation on 06/07/2020 at 1:54 pm to provider Kathlen Mody , who verbally acknowledged these results. Electronically Signed   By: Donzetta Kohut M.D.   On: 06/07/2020 13:55   DG Abd Portable 1V  Result Date: 05/26/2020 CLINICAL DATA:  Evaluate stool burden EXAM: PORTABLE ABDOMEN - 1 VIEW COMPARISON:  05/21/2020 FINDINGS: Contrast material is noted within the colon but to a lesser degree than that seen on the prior exam. Mild retained fecal material is noted. No obstructive changes are seen. Stomach is distended with air. IMPRESSION: Mild retained fecal material.  No obstructive changes are noted. Electronically Signed   By: Alcide Clever M.D.   On: 05/26/2020 15:26   DG Abd Portable 1V-Small Bowel Obstruction Protocol-initial, 8 hr delay  Result Date: 05/21/2020 CLINICAL DATA:  69 year old female  small-bowel obstruction protocol, 8 hours post contrast. EXAM: PORTABLE ABDOMEN - 1 VIEW COMPARISON:  05/20/2020 and earlier. FINDINGS: AP view at 0232 hours. Stable enteric tube in the left upper quadrant. Administered oral contrast is predominantly in large bowel, from the cecum to the descending colon. Gas-filled bowel loop now projecting over the mid lower abdomen is probably redundant sigmoid. Visible small bowel loops appear within normal limits. IMPRESSION: Administered oral contrast is primarily throughout the large intestine. No convincing small bowel obstruction. Electronically Signed   By: Odessa Fleming M.D.   On: 05/21/2020 04:38   DG Abd Portable 1V  Result Date: 05/20/2020 CLINICAL DATA:  Abdominal distension several days. EXAM: PORTABLE ABDOMEN - 1 VIEW COMPARISON:  05/19/2020 FINDINGS: Interval improvement and bowel gas pattern with no significantly dilated small bowel loops on the current exam. No free peritoneal air. Air is present throughout the colon. Mild fecal retention over the rectum. Nasogastric tube has tip over the stomach in the left upper quadrant. Remainder of the exam is unchanged. IMPRESSION: Interval improvement with no significant dilated small bowel loops on the current exam. Nasogastric tube with tip over the stomach in the left upper quadrant. Electronically Signed   By: Elberta Fortis M.D.   On: 05/20/2020 10:27   DG Abd Portable 1V  Result Date: 05/19/2020 CLINICAL DATA:  Diarrhea. EXAM: PORTABLE ABDOMEN - 1 VIEW COMPARISON:  None. FINDINGS: Stomach is nondistended. There are multiple dilated loops of small bowel identified within the left abdomen measuring up to 3.1 cm. A large stool burden is noted involving the right colon. IMPRESSION: 1. Dilated loops of small bowel within the left abdomen compatible which may reflect partial small bowel obstruction or small-bowel ileus. 2. Large stool burden within the right colon. Electronically Signed   By: Signa Kell M.D.   On:  05/19/2020 11:41   VAS [place] LOWER EXTREMITY VENOUS (DVT)  Result Date: 06/08/2020  Lower Venous DVTStudy Indications: Edema.  Limitations: Patient cooperation. Comparison Study: 06/01/20 Performing Technologist: Levada Schilling RDMS, RVT  Examination Guidelines: A complete evaluation includes B-mode imaging, spectral Doppler, color Doppler, and power Doppler as needed of all accessible portions of each vessel. Bilateral testing is considered an integral part of a complete examination. Limited examinations for reoccurring indications may be performed as noted. The reflux portion of the exam is performed with the patient in reverse Trendelenburg.  +---------+---------------+---------+-----------+-----------+------------------+  RIGHT     Compressibility Phasicity Spontaneity Properties  Thrombus Aging      +---------+---------------+---------+-----------+-----------+------------------+  CFV       Partial         Yes       Yes         brightly    Age Indeterminate                                                    echogenic                       +---------+---------------+---------+-----------+-----------+------------------+  SFJ       Full                                                                  +---------+---------------+---------+-----------+-----------+------------------+  FV Prox   Full                                                                  +---------+---------------+---------+-----------+-----------+------------------+  FV Mid    Full                                                                  +---------+---------------+---------+-----------+-----------+------------------+  FV Distal Full                                                                  +---------+---------------+---------+-----------+-----------+------------------+  PFV       None                                  softly                                                                           echogenic                        +---------+---------------+---------+-----------+-----------+------------------+  POP                       Yes       Yes                     unable to compress  +---------+---------------+---------+-----------+-----------+------------------+  PTV       Full                                                                  +---------+---------------+---------+-----------+-----------+------------------+  PERO      Full                                                                  +---------+---------------+---------+-----------+-----------+------------------+  GSV       Full                                                                  +---------+---------------+---------+-----------+-----------+------------------+ Non-occlusive, age indeterminate thrombus seen in the right distal CFV extending from the [facility].  +---------+---------------+---------+-----------+----------+------------------+  LEFT      Compressibility Phasicity Spontaneity Properties Thrombus Aging      +---------+---------------+---------+-----------+----------+------------------+  CFV       Full            Yes       Yes                                        +---------+---------------+---------+-----------+----------+------------------+  SFJ       Full                                                                 +---------+---------------+---------+-----------+----------+------------------+  FV Prox   Full                                                                 +---------+---------------+---------+-----------+----------+------------------+  FV Mid    Full                                                                 +---------+---------------+---------+-----------+----------+------------------+  FV Distal Full                                                                 +---------+---------------+---------+-----------+----------+------------------+  PFV       Full                                                                  +---------+---------------+---------+-----------+----------+------------------+  POP                       Yes       Yes                    unable to compress  +---------+---------------+---------+-----------+----------+------------------+  PTV       Full                                                                 +---------+---------------+---------+-----------+----------+------------------+  PERO      Full                                                                 +---------+---------------+---------+-----------+----------+------------------+  GSV       Full                                                                 +---------+---------------+---------+-----------+----------+------------------+    Summary: RIGHT: - Findings consistent with age indeterminate deep vein thrombosis involving the right common femoral vein, and right proximal profunda vein. - No cystic structure found in the popliteal fossa.  LEFT: -  There is no evidence of deep vein thrombosis in the lower extremity. However, portions of this examination were limited- see technologist comments above.  - No cystic structure found in the popliteal fossa.  *See table(s) above for measurements and observations. Electronically signed by Waverly Ferrari MD on 06/08/2020 at 10:57:28 PM.    Final    VAS [place] LOWER EXTREMITY VENOUS (DVT)  Result Date: 06/01/2020  Lower Venous DVTStudy Indications: Edema.  Limitations: Patient could not tolerate compressions. Patient was combative, threatening, non-compliant. Comparison Study: No prior studies. Performing Technologist: Jean Rosenthal  Examination Guidelines: A complete evaluation includes B-mode imaging, spectral Doppler, color Doppler, and power Doppler as needed of all accessible portions of each vessel. Bilateral testing is considered an integral part of a complete examination. Limited examinations for reoccurring indications may be performed as noted. The reflux portion of the exam is  performed with the patient in reverse Trendelenburg.  +---------+---------------+---------+-----------+---------------+--------------+  RIGHT     Compressibility Phasicity Spontaneity Properties      Thrombus Aging  +---------+---------------+---------+-----------+---------------+--------------+  CFV       Full            Yes       Yes                                         +---------+---------------+---------+-----------+---------------+--------------+  SFJ       Full                                                                  +---------+---------------+---------+-----------+---------------+--------------+  FV Prox                             Yes         Patent by color                 +---------+---------------+---------+-----------+---------------+--------------+  FV Mid                              Yes         Patent by color                 +---------+---------------+---------+-----------+---------------+--------------+  FV Distal                           Yes         Patent by color                 +---------+---------------+---------+-----------+---------------+--------------+  PFV                                 Yes         Patent by color                 +---------+---------------+---------+-----------+---------------+--------------+  PTV                                 Yes  Patent by color                 +---------+---------------+---------+-----------+---------------+--------------+  PERO                                Yes         Patent by color                 +---------+---------------+---------+-----------+---------------+--------------+   +---------+---------------+---------+-----------+---------------+--------------+  LEFT      Compressibility Phasicity Spontaneity Properties      Thrombus Aging  +---------+---------------+---------+-----------+---------------+--------------+  CFV                                 Yes         Patent by color                  +---------+---------------+---------+-----------+---------------+--------------+  SFJ                                 Yes         Patent by color                 +---------+---------------+---------+-----------+---------------+--------------+  FV Prox                             Yes         Patent by color                 +---------+---------------+---------+-----------+---------------+--------------+  FV Mid                              Yes         Patent by color                 +---------+---------------+---------+-----------+---------------+--------------+  FV Distal                           Yes         Patent by color                 +---------+---------------+---------+-----------+---------------+--------------+  PFV                                                             Not visualized  +---------+---------------+---------+-----------+---------------+--------------+  POP                                                             Not visualized  +---------+---------------+---------+-----------+---------------+--------------+  PTV                                 Yes         Patent by color                 +---------+---------------+---------+-----------+---------------+--------------+  PERO                                Yes         Patent by color                 +---------+---------------+---------+-----------+---------------+--------------+     Summary: RIGHT: - There is no evidence of deep vein thrombosis in the lower extremity. However, portions of this examination were limited- see technologist comments above.  LEFT: - There is no evidence of deep vein thrombosis in the lower extremity. However, portions of this examination were limited- see technologist comments above.  *See table(s) above for measurements and observations. Electronically signed by Fabienne Brunsharles Fields MD on 06/01/2020 at 11:31:17 AM.    Final     Labs:  CBC: Recent Labs    05/31/20 0730 06/04/20 1241 06/08/20 0721  06/10/20 0030  WBC 8.6 4.9 5.0 6.8  HGB 8.8* 10.1* 9.5* 8.5*  HCT 27.8* 31.9* 29.7* 26.5*  PLT 369 391 355 368    COAGS: No results for input(s): INR, APTT in the last 8760 hours.  BMP: Recent Labs    05/31/20 0338 05/31/20 0338 05/31/20 0730 06/03/20 1210 06/04/20 1241 06/10/20 0030  NA 138  --   --  137 137 132*  K 3.4*  --   --  3.2* 4.0 4.0  CL 103  --   --  101 100 99  CO2 27  --   --  27 29 25   GLUCOSE 106*  --   --  209* 108* 124*  BUN 14  --   --  6* 6* 10  CALCIUM 8.2*  --   --  8.8* 9.5 8.6*  CREATININE 0.66   < > 0.74 0.56 0.47 0.45  GFRNONAA >60   < > >60 >60 >60 >60  GFRAA >60   < > >60 >60 >60 >60   < > = values in this interval not displayed.    LIVER FUNCTION TESTS: Recent Labs    05/17/20 1339 05/26/20 0418 05/30/20 2307 05/31/20 0338  BILITOT 1.0 0.8 0.3 0.5  AST 29 60* 37 29  ALT 19 37 27 21  ALKPHOS 88 45 47 34*  PROT 6.8 5.8* 5.3* 4.3*  ALBUMIN 3.5 1.8* 1.6* 1.5*    TUMOR MARKERS: No results for input(s): AFPTM, CEA, CA199, CHROMGRNA in the last 8760 hours.  Assessment and Plan: L3-L4 Discitis/osteomyelitis After GOC discussion with family, plan made to proceed with disc aspiration.  She did receive a dose of Cipro and Flagyl overnight.  PA to bedside to discuss with family.  Explained that patient will be made NPO p MN tonight for procedure with sedation tomorrow.  Despite sedation use, patient will still need to be cooperative and agreeable during procedure tomorrow.  Discussed nature of procedure including positioning, duration, and sampling.  Sister and patient voice understanding.  Sister gives consent.  Patient voices she is willing to undergo procedure tomorrow.   Risks and benefits was discussed with the patient and/or patient's family including, but not limited to bleeding, infection, damage to adjacent structures or low yield requiring additional tests.  All of the questions were answered and there is agreement to  proceed.  Consent signed and in chart.  NPO p MN Hold AM lovenox dose.   Thank you for this interesting consult.  I greatly enjoyed meeting Nicole [name] and look forward to participating in their  care.  A copy of this report was sent to the requesting provider on this date.  Electronically Signed: Hoyt Koch, PA 06/10/2020, 1:53 PM   I spent a total of 40 Minutes    in face to face in clinical consultation, greater than 50% of which was counseling/coordinating care for discitis/osteomyelitis.

## 2020-06-11 ENCOUNTER — Inpatient Hospital Stay (HOSPITAL_COMMUNITY): Payer: Medicare Other

## 2020-06-11 HISTORY — PX: IR LUMBAR DISC ASPIRATION W/IMG GUIDE: IMG5306

## 2020-06-11 LAB — SEDIMENTATION RATE: Sed Rate: 51 mm/hr — ABNORMAL HIGH (ref 0–22)

## 2020-06-11 LAB — CBC WITH DIFFERENTIAL/PLATELET
Abs Immature Granulocytes: 0.04 10*3/uL (ref 0.00–0.07)
Basophils Absolute: 0 10*3/uL (ref 0.0–0.1)
Basophils Relative: 0 %
Eosinophils Absolute: 0.1 10*3/uL (ref 0.0–0.5)
Eosinophils Relative: 2 %
HCT: 24.9 % — ABNORMAL LOW (ref 36.0–46.0)
Hemoglobin: 7.9 g/dL — ABNORMAL LOW (ref 12.0–15.0)
Immature Granulocytes: 1 %
Lymphocytes Relative: 17 %
Lymphs Abs: 0.8 10*3/uL (ref 0.7–4.0)
MCH: 31.5 pg (ref 26.0–34.0)
MCHC: 31.7 g/dL (ref 30.0–36.0)
MCV: 99.2 fL (ref 80.0–100.0)
Monocytes Absolute: 0.5 10*3/uL (ref 0.1–1.0)
Monocytes Relative: 11 %
Neutro Abs: 3.5 10*3/uL (ref 1.7–7.7)
Neutrophils Relative %: 69 %
Platelets: 421 10*3/uL — ABNORMAL HIGH (ref 150–400)
RBC: 2.51 MIL/uL — ABNORMAL LOW (ref 3.87–5.11)
RDW: 13.8 % (ref 11.5–15.5)
WBC: 5 10*3/uL (ref 4.0–10.5)
nRBC: 0 % (ref 0.0–0.2)

## 2020-06-11 LAB — C-REACTIVE PROTEIN: CRP: 8 mg/dL — ABNORMAL HIGH (ref <1.0)

## 2020-06-11 LAB — BASIC METABOLIC PANEL
Anion gap: 7 (ref 5–15)
BUN: 8 mg/dL (ref 8–23)
CO2: 25 mmol/L (ref 22–32)
Calcium: 8.6 mg/dL — ABNORMAL LOW (ref 8.9–10.3)
Chloride: 102 mmol/L (ref 98–111)
Creatinine, Ser: 0.47 mg/dL (ref 0.44–1.00)
GFR calc Af Amer: >60 mL/min (ref >60)
GFR calc non Af Amer: >60 mL/min (ref >60)
Glucose, Bld: 109 mg/dL — ABNORMAL HIGH (ref 70–99)
Potassium: 3.8 mmol/L (ref 3.5–5.1)
Sodium: 134 mmol/L — ABNORMAL LOW (ref 135–145)

## 2020-06-11 MED ORDER — SODIUM CHLORIDE 0.9 % IV SOLN: INTRAVENOUS | Status: AC

## 2020-06-11 MED ORDER — LIDOCAINE HCL 1 % IJ SOLN
INTRAMUSCULAR | Status: AC
  Filled 2020-06-11: qty 20

## 2020-06-11 MED ORDER — ENOXAPARIN SODIUM 80 MG/0.8ML ~~LOC~~ SOLN
80.0000 mg | Freq: Two times a day (BID) | SUBCUTANEOUS | Status: DC
  Administered 2020-06-12 – 2020-06-17 (×11): 80 mg via SUBCUTANEOUS
  Filled 2020-06-11 (×13): qty 0.8

## 2020-06-11 MED ORDER — MIDAZOLAM HCL 2 MG/2ML IJ SOLN
INTRAMUSCULAR | Status: AC
  Filled 2020-06-11: qty 2

## 2020-06-11 MED ORDER — ENOXAPARIN SODIUM 80 MG/0.8ML ~~LOC~~ SOLN
80.0000 mg | Freq: Once | SUBCUTANEOUS | Status: AC
  Administered 2020-06-11: 80 mg via SUBCUTANEOUS
  Filled 2020-06-11: qty 0.8

## 2020-06-11 MED ORDER — FENTANYL CITRATE (PF) 100 MCG/2ML IJ SOLN
INTRAMUSCULAR | Status: AC
  Filled 2020-06-11: qty 2

## 2020-06-11 MED ORDER — OXYCODONE HCL 5 MG PO TABS: 5.0000 mg | ORAL_TABLET | ORAL | Status: DC | PRN

## 2020-06-11 MED ORDER — MIDAZOLAM HCL 2 MG/2ML IJ SOLN
INTRAMUSCULAR | Status: AC | PRN
  Administered 2020-06-11: 1 mg via INTRAVENOUS
  Administered 2020-06-11 (×2): 0.5 mg via INTRAVENOUS

## 2020-06-11 MED ORDER — LIDOCAINE HCL (PF) 1 % IJ SOLN
INTRAMUSCULAR | Status: AC | PRN
  Administered 2020-06-11: 10 mL

## 2020-06-11 MED ORDER — FENTANYL CITRATE (PF) 100 MCG/2ML IJ SOLN
INTRAMUSCULAR | Status: AC | PRN
  Administered 2020-06-11 (×2): 25 ug via INTRAVENOUS

## 2020-06-11 MED ORDER — POTASSIUM CHLORIDE CRYS ER 20 MEQ PO TBCR
40.0000 meq | EXTENDED_RELEASE_TABLET | Freq: Every day | ORAL | Status: DC
  Administered 2020-06-12 – 2020-06-13 (×2): 40 meq via ORAL
  Filled 2020-06-11 (×2): qty 2

## 2020-06-11 MED ORDER — TRAMADOL HCL 50 MG PO TABS: 50.0000 mg | ORAL_TABLET | Freq: Two times a day (BID) | ORAL | Status: DC | PRN

## 2020-06-11 NOTE — Progress Notes (Signed)
PT Cancellation Note  Patient Details Name: Nicole [name] MRN: [id number] DOB: February 28, 1951   Cancelled Treatment:    Reason Eval/Treat Not Completed: Patient declined, no reason specified.  Pt refused PT and OT today, with no pain complaints, stating her family will be here later.  Nursing was made aware of her refusal.  Retry at another time.   [name] 06/11/2020, 2:17 PM  Samul Dada, PT MS Acute Rehab Dept. Number: [facility] [id number] and [facility] [phone]

## 2020-06-11 NOTE — Progress Notes (Addendum)
ANTICOAGULATION CONSULT NOTE - Follow Up Consult  Pharmacy Consult for IV Heparin>>Lovenox Indication: pulmonary embolus  Allergies  Allergen Reactions  . Seasonal Ic [Cholestatin]     Runny nose    Patient Measurements: Height: 5\' 7"  (170.2 cm) Weight: 82.5 kg (181 lb 14.1 oz) IBW/kg (Calculated) : 61.6 Heparin Dosing Weight: 82.1 kg  Vital Signs: Temp: 98 F (36.7 C) (07/09 1436) Temp Source: Oral (07/09 1436) BP: 116/65 (07/09 1436) Pulse Rate: 80 (07/09 1436)  Labs: Recent Labs    06/10/20 0030 06/11/20 1113  HGB 8.5* 7.9*  HCT 26.5* 24.9*  PLT 368 421*  CREATININE 0.45 0.47    Estimated Creatinine Clearance: 72.3 mL/min (by C-G formula based on SCr of 0.47 mg/dL).   Medical History: Past Medical History:  Diagnosis Date  . 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 Allergic rhinitis   . Alopecia   . Discoid lupus   . Hyperlipidemia   . Hypertension   . Menopause   . Moderate mental retardation   . Obesity   . Osteoporosis   . Pulmonary nodules   . Seizures (HCC)    focal-being by Dr. 08/12/20 (should not take anti-histamines)    Assessment: 69 yr old female admitted on 05/30/20 with persistent diarrhea/generalized weakness, diagnosed with ischemic colitis. Pt underwent flex sig/colonoscopy on 06/04/20 showing altered vascular, congested, friable (with spontaneous bleeding), nodular, plaque covered, ulcerated and vascular-pattern-decreased mucosa in the sigmoid colon and in the descending colon.   Oncology 06/07/20, CT of abdomen and chest revealed thrombus in RLL pulmonary artery and branches. Vascular U/S on 6/29 revealed no evidence of DVT in lower extremities. Pharmacy was consulted to dose heparin for PE. Per med rec, pt was not on anticoagulation PTA.   As pt had been refusing blood draws, she was started on Lovenox 1 mg/kg (90 mg) SQ Q 12 hrs for treatment of PE.   H/H 7.9/24.9, platelets 421, Scr 0.47, TBW CrCl >100 ml/min. Pt's current wt is  82.5 kg.  Today, pt underwent IR procedure (lumbar disc aspiration) for discitis of lumbar region (low bleeding risk procedure, per consult information); procedure completed ~11 AM. Lovenox was on hold prior to procedure (last dose ~24 hrs prior to procedure). Pharmacy is consulted to restart anticoagulants/antiplatelet drugs at the appropriate time, per [facility] protocol. Per RN, no bleeding observed post-IR procedure.  Goal of Therapy:  Monitor platelets by anticoagulation protocol: Yes   Plan:  Reduce Lovenox dose to 80 mg (1 mg/kg) SQ 12 hrs, with first dose post-IR procedure at 1600 today  Monitor CBC as able (if pt consents to blood draws) Monitor for signs/symptoms of bleeding F/U transition to oral anticoagulant  [facility], PharmD, BCPS, [facility] Clinical Pharmacist

## 2020-06-11 NOTE — Progress Notes (Signed)
OT Cancellation Note  Patient Details Name: Nicole [name] MRN: [id number] DOB: 08-27-1951   Cancelled Treatment:    Reason Eval/Treat Not Completed: Patient declined, no reason specified. Patient stating had procedure earlier on her back but denies pain. Cleared by RN no precautions/bed rest orders. Patient continues to decline "I just don't feel good."  Marlyce Huge OT OT office: [phone]   Carmelia Roller 06/11/2020, 2:02 PM

## 2020-06-11 NOTE — Procedures (Signed)
INTERVENTIONAL NEURORADIOLOGY BRIEF POSTPROCEDURE NOTE  FLUOROSCOPY GUIDED DISC ASPIRATION   Attending: Dr. Baldemar Lenis  Assistant: None   Diagnosis: Discitis/osteomyelitis L3-4   Access site: Percutaneous   Anesthesia: Moderate sedation   Medication used: 1.5 mg Versed IV; 50 mcg Fentanyl IV.  Complications: None   Estimated blood loss: None   Specimen: Fine-needle aspiration of L3-4 disc   Findings: L3-4 endplate erosion.  Fine-needle aspiration collected with a 22 gauge (x2) and an 18 gauge.  Sample sent for microbiology analysis.   The patient tolerated the procedure well without incident or complication and is in stable condition.

## 2020-06-11 NOTE — Progress Notes (Signed)
PROGRESS NOTE  Nicole [name] BUL:[id number] DOB: [birth year] DOA: 05/30/2020 PCP: Vicenta Aly, [name]  HPI/Recap of past 74 hours: 69 year old female with history of seizure disorder, stroke with left hemiparesis, hypertension, hyperlipidemia presents from group home with persistent diarrhea and generalized weakness.  Patient reports she had a prolonged hospital stay at [facility] long for small bowel obstruction and was discharged on the 24th June.  And since then she has been having diarrhea.  C. difficile PCR and GI pathogen negative.  CT of the abdomen and pelvis shows Colonic wall thickening with pericolonic edema and fat stranding involving the distal descending through sigmoid colon consistent with colitis, likely infectious or inflammatory. Pt underwent flex sigmoidoscopy showing ischemic colitis. Rectal tube was removed. She was started on cholestyramine and low fiber diet.  Repeat abdomen and pelvis on 06/07/2020 shows thrombus in the right lower lobe on the lung, no bowel perforation.  She was started on Lovenox injection and venous duplex of the lower extremities revealed age indeterminate DVT involving the right common femoral vein and right proximal profunda vein.  In view of multiple medical problems, pt refusal to take heparin shots for DVT prophylaxis, refusing blood draws, refusing to eat, deconditioning, after extensive discussion with HCPOA, palliative care consulted for goals of care.   Hospital course complicated by CT findings of persistent sclerosis and endplate irregularity at L3-4, no changes since the PET scan done in 2010, difficult to entirely exclude discitis, confirmed by MRI lumbar spine.  ID has been consulted and following.  06/11/20: Seen and examined at her bedside.  She is more alert and interactive this morning.  She denies any abdominal pain or nausea, no back pain.  She does not think she had a bowel movement this morning.  Diarrhea is improved.  She was started on  Cipro and Flagyl on 06/09/2020 by GI for ischemic colitis thought to be due to diverticulitis with possible colonic ulcer.   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)   Current non-adherence to medical treatment (refusing blood draws) while in hospital   Right leg DVT ([place])   DNR (do not resuscitate)   Palliative care by specialist   Improving diarrhea secondary to ischemic colitis, thought to be due to diverticulitis with possible colonic ulcer.  Seen by GI, post flex sigmoidoscopy/colonoscopy Path report from colonoscopy done on 06/04/2020 showed colonic mucosa and inflamed granulation tissue consistent with ulcer.  The differential diagnosis includes segmental colitis associated with diverticulitis.  Clinical correlation is essential. No diverticulitis seen on CT scan or colonoscopy.   C. difficile PCR and GI panel negative Started on IV Cipro, IV Flagyl, cholestyramine, and gentle IV fluid hydration on 06/09/2020 Diarrhea is improving. Currently on  cholestyramine 3 times daily as recommended by GI.  Acute blood loss likely from GI losses in the setting of chronic normocytic anemia Drop in hemoglobin 7.9K from 8.5K from 9.5K Baseline hemoglobin 12 Sigmoidoscopy done on 06/04/2020: Altered vascular, congested, friable (with spontaneous bleeding), nodular, plaque covered, ulcerated and vascular-pattern-decreased mucosa in the sigmoid colon and in the descending colon. Biopsied. Maintain hemoglobin greater than 7  Acute pulmonary embolus in the right lower lobe of the lung,(incidental finding on the CT of the abdomen pelvis)/right lower extremity DVT Started the patient on Lovenox injection as patient is refusing blood draws and would not be ideal to being on IV heparin.          Venous duplex of the lower extremities revealed age indeterminate DVT involving            the right common femoral vein and right proximal profunda vein. Lovenox on hold due to planned procedure today Continue to monitor CBC  L3-4 discitis on CT scan, confirmed by MRI of lumbar spine 06/07/20 Independently viewed CT scan and MRI of lumbar spine showing evidence of L3-4 discitis. ID recommended aspiration by IR for cultures prior to initiating IV antibiotics Appreciate IDs assistance MRSA screen positive on 05/31/2020 Blood cultures drawn on 05/30/2020 - final. Pain management in place as needed  Seizure disorder Stable, no reported seizure activity Continue home Keppra and carbamazepine  Hyperlipidemia Continue Lipitor  History of CVA Continue statin  GERD Continue PPI  Physical debility PT OT assessment recommended SNF Nicole [name] prefers return to group home with home health PT OT TOC assisting with disposition Continue PT OT with assistance and fall precautions   Resolved hypokalemia  Post repletion Serum potassium 4.0   Deep tissue pressure injury of the heels, POA Pressure Injury 06/07/20 Heel Left Unstageable - Full thickness tissue loss in  which the base of the injury is covered by slough (yellow, tan, gray, green or brown) and/or eschar (tan, brown or black) in the wound bed. (Active)  06/07/20 0800  Location: Heel  Location Orientation: Left  Staging: Unstageable - Full thickness tissue loss in which the base of the injury is covered by slough (yellow, tan, gray, green or brown) and/or eschar (tan, brown or black) in the wound bed.  Wound Description (Comments):   Present on Admission:      Pressure Injury 06/07/20 Heel Anterior;Right;Left Stage 1 -  Intact skin with non-blanchable redness of a localized area usually over a bony prominence. (Active)  06/07/20 0800  Location: Heel  Location Orientation: Anterior;Right;Left  Staging: Stage 1 -  Intact skin with non-blanchable redness of a localized area usually over a bony prominence.  Wound Description (Comments):   Present on Admission:   Wound care consulted and recommendations given.   DVT prophylaxis: Lovenox subcu, full dose, twice daily Code Status: DNR Family Communication:  Updated Nicole [name] at bedside. Disposition:   Status is: Inpatient  Remains inpatient appropriate because:Ongoing diagnostic testing needed, pending this aspiration by IR and initiation of IV antibiotics   Dispo:             Patient From: Home             Planned Disposition: SNF  Expected discharge date: 06/14/20             Medically stable for discharge: No due to ongoing evaluation and management of L3-4 discitis.        Consultants:   Gastroenterology.   ID  IR  Procedures:  Aspiration of L3-4 by interventional radiology.  Antimicrobials:  Pending.     Objective: Vitals:   06/11/20 1017 06/11/20 1022 06/11/20 1027 06/11/20 1031  BP: (!) 143/64  (!) 129/53 (!) 136/57  Pulse: 85 85 86 87  Resp: _0 Temp:      TempSrc:      SpO2: 100% 100% 100% 97%  Weight:      Height:        Intake/Output Summary (Last 24 hours) at 06/11/2020  1308 Last data filed at 06/11/2020 0912 Gross per 24 hour  Intake 410.67 ml  Output 1450 ml  Net -1039.33 ml   Filed Weights   06/04/20 1340 06/05/20 0500 06/11/20 0638  Weight: 94.1 kg 88.6 kg 82.5 kg    Exam:  . General: 69 y.o. year-old female pleasant, frail-appearing in no acute distress.  Alert and interactive.   . Cardiovascular: Regular rate and rhythm no rubs or gallops.   Marland Kitchen Respiratory: Clear to auscultation no wheezes or rales. . Abdomen: Obese nontender normal bowel sounds present.   . Musculoskeletal: Lower extremity edema bilaterally.   Marland Kitchen Psychiatry: Mood is appropriate for condition and setting.   Data Reviewed: CBC: Recent Labs  Lab 06/08/20 0721 06/10/20 0030 06/11/20 1113  WBC 5.0 6.8 5.0  NEUTROABS  --  4.7 3.5  HGB 9.5* 8.5* 7.9*  HCT 29.7* 26.5* 24.9*  MCV 98.3 98.9 99.2  PLT 355 368 093*   Basic Metabolic Panel: Recent Labs  Lab 06/10/20 0030 06/11/20 1113  NA 132* 134*  K 4.0 3.8  CL 99 102  CO2 25 25  GLUCOSE 124* 109*  BUN 10 8  CREATININE 0.45 0.47  CALCIUM 8.6* 8.6*  MG 1.8  --   PHOS 2.7  --    GFR: Estimated Creatinine Clearance: 72.3 mL/min (by C-G formula based on SCr of 0.47 mg/dL). Liver Function Tests: No results for input(s): AST, ALT, ALKPHOS, BILITOT, PROT, ALBUMIN in the last 168 hours. No results for input(s): LIPASE, AMYLASE in the last 168 hours. No results for input(s): AMMONIA in the last 168 hours. Coagulation Profile: No results for input(s): INR, PROTIME in the last 168 hours. Cardiac Enzymes: No results for input(s): CKTOTAL, CKMB, CKMBINDEX, TROPONINI in the last 168 hours. BNP (last 3 results) No results for input(s): PROBNP in the last 8760 hours. HbA1C: No results for input(s): HGBA1C in the last 72 hours. CBG: No results for input(s): GLUCAP in the last 168 hours. Lipid Profile: No results for input(s): CHOL, HDL, LDLCALC, TRIG, CHOLHDL, LDLDIRECT in the last 72 hours. Thyroid Function Tests: No  results for input(s): TSH, T4TOTAL, FREET4, T3FREE, THYROIDAB in the last 72 hours. Anemia Panel: No results for input(s): VITAMINB12, FOLATE, FERRITIN, TIBC, IRON, RETICCTPCT in the last 72 hours. Urine analysis:    Component Value Date/Time   COLORURINE AMBER (A) 05/31/2020 1005   APPEARANCEUR HAZY (A) 05/31/2020 1005   LABSPEC 1.045 (H) 05/31/2020 1005   PHURINE 5.0 05/31/2020 1005   GLUCOSEU NEGATIVE 05/31/2020 1005   HGBUR MODERATE (A) 05/31/2020 1005   BILIRUBINUR NEGATIVE 05/31/2020 1005   KETONESUR NEGATIVE 05/31/2020 1005   PROTEINUR NEGATIVE 05/31/2020 1005   NITRITE NEGATIVE 05/31/2020 1005  LEUKOCYTESUR MODERATE (A) 05/31/2020 1005   Sepsis Labs: _0 (procalcitonin:4,lacticidven:4)  ) No results found for this or any previous visit (from the past 240 hour(s)).    Studies: No results found.  Scheduled Meds: . acetaminophen  1,000 mg Oral TID  . atorvastatin  10 mg Oral Q M,W,F  . budesonide  0.25 mg Inhalation BID  . calcium carbonate  1 tablet Oral TID WC  . carbamazepine  300 mg Oral BID  . cholestyramine  4 g Oral TID  . [START ON 06/12/2020] enoxaparin (LOVENOX) injection  90 mg Subcutaneous Q12H  . feeding supplement  1 Container Oral TID BM  . feeding supplement (PRO-STAT SUGAR FREE 64)  30 mL Oral TID  . fentaNYL      . gabapentin  600 mg Oral QHS  . levETIRAcetam  500 mg Oral BID  . lidocaine      . mouth rinse  15 mL Mouth Rinse BID  . midazolam      . multivitamin with minerals  1 tablet Oral Daily  . mupirocin ointment   Nasal BID  . pantoprazole  40 mg Oral Daily  . [START ON 06/12/2020] potassium chloride  40 mEq Oral Daily  . sodium chloride flush  3 mL Intravenous Q12H    Continuous Infusions: . sodium chloride 10 mL/hr at 06/03/20 0800  . sodium chloride Stopped (05/31/20 1259)  . sodium chloride 50 mL/hr at 06/10/20 1849  . ciprofloxacin 400 mg (06/11/20 0528)  . metronidazole 500 mg (06/11/20 1052)     LOS: 11 days      Kayleen Memos, MD [facility] Pager [phone]  If 7PM-7AM, please contact night-coverage [email or web address] Password [email or web address] 06/11/2020, 1:08 PM

## 2020-06-11 NOTE — Sedation Documentation (Signed)
Patient is resting comfortably. 

## 2020-06-12 ENCOUNTER — Inpatient Hospital Stay: Payer: Self-pay

## 2020-06-12 LAB — CBC WITH DIFFERENTIAL/PLATELET
Abs Immature Granulocytes: 0.06 10*3/uL (ref 0.00–0.07)
Basophils Absolute: 0 10*3/uL (ref 0.0–0.1)
Basophils Relative: 1 %
Eosinophils Absolute: 0.2 10*3/uL (ref 0.0–0.5)
Eosinophils Relative: 3 %
HCT: 32 % — ABNORMAL LOW (ref 36.0–46.0)
Hemoglobin: 10 g/dL — ABNORMAL LOW (ref 12.0–15.0)
Immature Granulocytes: 1 %
Lymphocytes Relative: 22 %
Lymphs Abs: 1.2 10*3/uL (ref 0.7–4.0)
MCH: 31.5 pg (ref 26.0–34.0)
MCHC: 31.3 g/dL (ref 30.0–36.0)
MCV: 100.9 fL — ABNORMAL HIGH (ref 80.0–100.0)
Monocytes Absolute: 0.7 10*3/uL (ref 0.1–1.0)
Monocytes Relative: 14 %
Neutro Abs: 3.2 10*3/uL (ref 1.7–7.7)
Neutrophils Relative %: 59 %
Platelets: 399 10*3/uL (ref 150–400)
RBC: 3.17 MIL/uL — ABNORMAL LOW (ref 3.87–5.11)
RDW: 14.3 % (ref 11.5–15.5)
WBC: 5.4 10*3/uL (ref 4.0–10.5)
nRBC: 0 % (ref 0.0–0.2)

## 2020-06-12 LAB — BASIC METABOLIC PANEL
Anion gap: 8 (ref 5–15)
BUN: 10 mg/dL (ref 8–23)
CO2: 21 mmol/L — ABNORMAL LOW (ref 22–32)
Calcium: 8.4 mg/dL — ABNORMAL LOW (ref 8.9–10.3)
Chloride: 103 mmol/L (ref 98–111)
Creatinine, Ser: 0.44 mg/dL (ref 0.44–1.00)
GFR calc Af Amer: >60 mL/min (ref >60)
GFR calc non Af Amer: >60 mL/min (ref >60)
Glucose, Bld: 107 mg/dL — ABNORMAL HIGH (ref 70–99)
Potassium: 4.2 mmol/L (ref 3.5–5.1)
Sodium: 132 mmol/L — ABNORMAL LOW (ref 135–145)

## 2020-06-12 MED ORDER — VANCOMYCIN HCL IN DEXTROSE 1-5 GM/200ML-% IV SOLN
1000.0000 mg | Freq: Two times a day (BID) | INTRAVENOUS | Status: DC
  Administered 2020-06-13 – 2020-06-17 (×10): 1000 mg via INTRAVENOUS
  Filled 2020-06-12 (×11): qty 200

## 2020-06-12 MED ORDER — SODIUM CHLORIDE 0.9 % IV SOLN
2.0000 g | INTRAVENOUS | Status: DC
  Administered 2020-06-12 – 2020-06-17 (×6): 2 g via INTRAVENOUS
  Filled 2020-06-12 (×4): qty 20
  Filled 2020-06-12: qty 2
  Filled 2020-06-12: qty 20

## 2020-06-12 MED ORDER — VANCOMYCIN HCL 1500 MG/300ML IV SOLN
1500.0000 mg | Freq: Once | INTRAVENOUS | Status: AC
  Administered 2020-06-12: 1500 mg via INTRAVENOUS
  Filled 2020-06-12 (×2): qty 300

## 2020-06-12 NOTE — Progress Notes (Signed)
Pt refused to have labs drawn at this time. Lab will return at 6:00 a.m.Marland Kitchen

## 2020-06-12 NOTE — Progress Notes (Signed)
Patient refusing lab drawn for the second time.

## 2020-06-12 NOTE — Plan of Care (Signed)
  Problem: Education: Goal: Knowledge of General Education information will improve Description: Including pain rating scale, medication(s)/side effects and non-pharmacologic comfort measures Outcome: Progressing   Problem: Clinical Measurements: Goal: Ability to maintain clinical measurements within normal limits will improve Outcome: Progressing Goal: Will remain free from infection Outcome: Progressing Goal: Diagnostic test results will improve Outcome: Progressing Goal: Respiratory complications will improve Outcome: Progressing Goal: Cardiovascular complication will be avoided Outcome: Progressing   Problem: Coping: Goal: Level of anxiety will decrease Outcome: Progressing   Problem: Elimination: Goal: Will not experience complications related to bowel motility Outcome: Progressing Goal: Will not experience complications related to urinary retention Outcome: Progressing   Problem: Pain Managment: Goal: General experience of comfort will improve Outcome: Progressing   Problem: Safety: Goal: Ability to remain free from injury will improve Outcome: Progressing   

## 2020-06-12 NOTE — Progress Notes (Signed)
Spoke with RN re PICC placement.  PICC not to be placed today.  Aware that sister is to give consent.  No discharge date per RN.

## 2020-06-12 NOTE — Progress Notes (Addendum)
PROGRESS NOTE  EULINE KIMBLER WCH:[id number] DOB: [birth year] DOA: 05/30/2020 PCP: Vicenta Aly, [name]  HPI/Recap of past 11 hours: 69 year old female with history of seizure disorder, stroke with left hemiparesis, hypertension, hyperlipidemia presents from group home with persistent diarrhea and generalized weakness.  C. difficile PCR and GI pathogen negative.  CT of the abdomen and pelvis shows Colonic wall thickening with pericolonic edema and fat stranding involving the distal descending through sigmoid colon consistent with colitis, likely infectious or inflammatory.  Flex sigmoidoscopy showing ischemic colitis. Rectal tube was removed. She was started on cholestyramine and low fiber diet.  Repeat abdomen and pelvis on 06/07/2020 shows thrombus in the right lower lobe on the lung, no bowel perforation.  She was started on full dose Lovenox.  Venous duplex of the lower extremities revealed age indeterminate DVT involving the right common femoral vein and right proximal profunda vein.  In view of multiple medical problems, pt refusal to take heparin shots for DVT prophylaxis, refusing blood draws, refusing to eat, deconditioning, after extensive discussion with HCPOA, palliative care was consulted to assist with establishing goals of care.   Was started on IV antibiotics Cipro and Flagyl on 7/7 by GI for ischemic colitis with suspected diverticulitis and possible colonic ulcer as an etiology.  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  ID consulted.  Aspiration of L3-L4 completed on 06/11/2020 by interventional radiology.  Now on IV Vancomycin and rocephin empirically by ID.  06/12/20: Seen and examined.  Reports nausea and vomiting early this morning.  Declines blood work this morning.   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   History of stroke with residual deficit (left hemiparesis)   Hypermagnesemia   Hypercalcemia   Hyperkalemia   Hyperglycemia   MRSA carrier   Hyponatremia   Hypokalemia   Anemia of chronic disease   Constipation in female   At risk for aspiration related to tube feeding   Pleural effusion, bilateral   Acute pulmonary embolism (HCC)   Coronary artery disease   Right ovarian cyst   Aortic atherosclerosis (HCC)   Spinal stenosis   Ischemic colitis ([place])   Current non-adherence to medical treatment (refusing blood draws) while in hospital   Right leg DVT ([place])   DNR (do not resuscitate)   Palliative care by specialist   Diarrhea secondary to ischemic colitis, thought to be due to diverticulitis with possible colonic ulcer.  Seen by GI, post flex sigmoidoscopy/colonoscopy Path report from colonoscopy done on 06/04/2020 showed colonic mucosa and inflamed granulation tissue consistent with ulcer.  The differential diagnosis includes segmental colitis associated with diverticulitis.  Clinical correlation is essential. No diverticulitis seen on CT scan or colonoscopy.   C. difficile PCR and GI panel negative Started on IV Cipro, IV Flagyl, cholestyramine, and gentle IV fluid hydration on 06/09/2020 Continue cholestyramine 3 times daily as recommended by GI.  Acute blood  loss likely from GI losses in the setting of chronic normocytic anemia Drop in hemoglobin 7.9K from 8.5K from 9.5K Baseline hemoglobin 12 Sigmoidoscopy done on 06/04/2020: Altered vascular, congested,  friable (with spontaneous bleeding), nodular, plaque covered, ulcerated and vascular-pattern-decreased mucosa in the sigmoid colon and in the descending colon. Biopsied. Maintain hemoglobin greater than 7  Nausea/vomiting No abdominal pain on exam, hypoactive bowel sounds present Continue supportive care IV antiemetics as needed Patient declines lab work  Acute pulmonary embolus in the right lower lobe of the lung,(incidental finding on the CT of the abdomen pelvis)/right lower extremity DVT Started the patient on Lovenox injection as patient is refusing blood draws and would not be ideal to being on IV heparin.          Venous duplex of the lower extremities revealed age indeterminate DVT involving            the right common femoral vein and right proximal profunda vein. Lovenox on hold due to planned procedure today Continue to monitor CBC  L3-4 discitis on CT scan, confirmed by MRI of lumbar spine 06/07/20 post intervertebral L3-4 disc aspirate on 06/11/2020 by interventional radiology Independently viewed CT scan and MRI of lumbar spine showing evidence of L3-4 discitis. ID recommended aspiration by IR for cultures prior to initiating IV antibiotics Appreciate IDs assistance MRSA screen positive on 05/31/2020 Blood cultures drawn on 05/30/2020 - final. Pain management in place as needed Follow cultures from L3-4 disc aspirate  Seizure disorder Stable, no reported seizure activity Continue home Keppra and carbamazepine  Hyperlipidemia Continue Lipitor  History of CVA Continue statin  GERD Continue PPI  Physical debility PT OT assessment recommended SNF Sister prefers return to group home with home health PT OT TOC assisting with disposition Continue PT OT with assistance and fall precautions   Resolved hypokalemia  Post repletion Serum potassium 4.0   Deep tissue pressure injury of the heels, POA Pressure Injury 06/07/20 Heel Left Unstageable - Full thickness tissue  loss in which the base of the injury is covered by slough (yellow, tan, gray, green or brown) and/or eschar (tan, brown or black) in the wound bed. (Active)  06/07/20 0800  Location: Heel  Location Orientation: Left  Staging: Unstageable - Full thickness tissue loss in which the base of the injury is covered by slough (yellow, tan, gray, green or brown) and/or eschar (tan, brown or black) in the wound bed.  Wound Description (Comments):   Present on Admission:      Pressure Injury 06/07/20 Heel Anterior;Right;Left Stage 1 -  Intact skin with non-blanchable redness of a localized area usually over a bony prominence. (Active)  06/07/20 0800  Location: Heel  Location Orientation: Anterior;Right;Left  Staging: Stage 1 -  Intact skin with non-blanchable redness of a localized area usually over a bony prominence.  Wound Description (Comments):   Present on Admission:   Wound care consulted and recommendations given.   DVT prophylaxis: Lovenox subcu, full dose, twice daily Code Status: DNR Family Communication:  Updated sister at bedside. Disposition:   Status is: Inpatient  Remains inpatient appropriate because:Ongoing diagnostic testing needed, pending this aspiration by IR and initiation of IV antibiotics   Dispo:             Patient From: Home             Planned Disposition: SNF             Expected discharge date: 06/14/20  Medically stable for discharge: No due to ongoing evaluation and management of L3-4 discitis.        Consultants:   Gastroenterology.   ID  IR  Procedures:  Aspiration of L3-4 by interventional radiology.  Antimicrobials:  Pending.     Objective: Vitals:   06/11/20 1436 06/11/20 2014 06/12/20 0528 06/12/20 0554  BP: 116/65 (!) 104/59 110/72   Pulse: 80 91 70   Resp: '18 16 16   '$ Temp: 98 F (36.7 C) 98 F (36.7 C) 97.9 F (36.6 C)   TempSrc: Oral Oral Oral   SpO2: 98% 98% 100%   Weight:    81.6 kg  Height:          Intake/Output Summary (Last 24 hours) at 06/12/2020 1343 Last data filed at 06/12/2020 4259 Gross per 24 hour  Intake 784.54 ml  Output 851 ml  Net -66.46 ml   Filed Weights   06/05/20 0500 06/11/20 0638 06/12/20 0554  Weight: 88.6 kg 82.5 kg 81.6 kg    Exam:  . General: 69 y.o. year-old female chronically ill-appearing in no acute distress.  Alert and interactive.   . Cardiovascular: Regular rate and rhythm no rubs or gallop. Marland Kitchen Respiratory: Clear to auscultation no wheezes or rales.   . Abdomen: Obese nontender normal bowel sounds present.   . Musculoskeletal: Lower extremity edema bilaterally.   Marland Kitchen Psychiatry: Mood is appropriate for condition and setting.  Data Reviewed: CBC: Recent Labs  Lab 06/08/20 0721 06/10/20 0030 06/11/20 1113  WBC 5.0 6.8 5.0  NEUTROABS  --  4.7 3.5  HGB 9.5* 8.5* 7.9*  HCT 29.7* 26.5* 24.9*  MCV 98.3 98.9 99.2  PLT 355 368 563*   Basic Metabolic Panel: Recent Labs  Lab 06/10/20 0030 06/11/20 1113  NA 132* 134*  K 4.0 3.8  CL 99 102  CO2 25 25  GLUCOSE 124* 109*  BUN 10 8  CREATININE 0.45 0.47  CALCIUM 8.6* 8.6*  MG 1.8  --   PHOS 2.7  --    GFR: Estimated Creatinine Clearance: 71.9 mL/min (by C-G formula based on SCr of 0.47 mg/dL). Liver Function Tests: No results for input(s): AST, ALT, ALKPHOS, BILITOT, PROT, ALBUMIN in the last 168 hours. No results for input(s): LIPASE, AMYLASE in the last 168 hours. No results for input(s): AMMONIA in the last 168 hours. Coagulation Profile: No results for input(s): INR, PROTIME in the last 168 hours. Cardiac Enzymes: No results for input(s): CKTOTAL, CKMB, CKMBINDEX, TROPONINI in the last 168 hours. BNP (last 3 results) No results for input(s): PROBNP in the last 8760 hours. HbA1C: No results for input(s): HGBA1C in the last 72 hours. CBG: No results for input(s): GLUCAP in the last 168 hours. Lipid Profile: No results for input(s): CHOL, HDL, LDLCALC, TRIG, CHOLHDL, LDLDIRECT  in the last 72 hours. Thyroid Function Tests: No results for input(s): TSH, T4TOTAL, FREET4, T3FREE, THYROIDAB in the last 72 hours. Anemia Panel: No results for input(s): VITAMINB12, FOLATE, FERRITIN, TIBC, IRON, RETICCTPCT in the last 72 hours. Urine analysis:    Component Value Date/Time   COLORURINE AMBER (A) 05/31/2020 1005   APPEARANCEUR HAZY (A) 05/31/2020 1005   LABSPEC 1.045 (H) 05/31/2020 1005   PHURINE 5.0 05/31/2020 1005   GLUCOSEU NEGATIVE 05/31/2020 1005   HGBUR MODERATE (A) 05/31/2020 1005   BILIRUBINUR NEGATIVE 05/31/2020 1005   KETONESUR NEGATIVE 05/31/2020 1005   PROTEINUR NEGATIVE 05/31/2020 1005   NITRITE NEGATIVE 05/31/2020 1005   LEUKOCYTESUR MODERATE (A) 05/31/2020 1005  Sepsis Labs: '@LABRCNTIP'$ (procalcitonin:4,lacticidven:4)  ) Recent Results (from the past 240 hour(s))  Aerobic/Anaerobic Culture (surgical/deep wound)     Status: None (Preliminary result)   Collection Time: 06/11/20 10:42 AM   Specimen: Fine Needle Aspirate  Result Value Ref Range Status   Specimen Description NEEDLE ASPIRATE  Final   Special Requests INERVERTEBRAL DISC L3 L4 ASPIRATE  Final   Gram Stain PENDING  Incomplete   Culture   Final    NO GROWTH < 24 HOURS Performed at [facility], [address], [place], [place] [address]    Report Status PENDING  Incomplete  Culture, fungus without smear     Status: None (Preliminary result)   Collection Time: 06/11/20 10:42 AM   Specimen: Fine Needle Aspirate  Result Value Ref Range Status   Specimen Description NEEDLE ASPIRATE  Final   Special Requests INTERVERTEBRAL DISC L3 L4 ASPIRATE  Final   Culture   Final    NO FUNGUS ISOLATED AFTER 1 DAY Performed at [facility], [place] [address] [place],  [address]    Report Status PENDING  Incomplete      Studies: No results found.  Scheduled Meds: . acetaminophen  1,000 mg Oral TID  . atorvastatin  10 mg Oral Q M,W,F  . budesonide  0.25 mg Inhalation BID  .  calcium carbonate  1 tablet Oral TID WC  . carbamazepine  300 mg Oral BID  . cholestyramine  4 g Oral TID  . enoxaparin (LOVENOX) injection  80 mg Subcutaneous Q12H  . feeding supplement  1 Container Oral TID BM  . feeding supplement (PRO-STAT SUGAR FREE 64)  30 mL Oral TID  . gabapentin  600 mg Oral QHS  . levETIRAcetam  500 mg Oral BID  . mouth rinse  15 mL Mouth Rinse BID  . multivitamin with minerals  1 tablet Oral Daily  . mupirocin ointment   Nasal BID  . pantoprazole  40 mg Oral Daily  . potassium chloride  40 mEq Oral Daily  . sodium chloride flush  3 mL Intravenous Q12H    Continuous Infusions: . sodium chloride 10 mL/hr at 06/03/20 0800  . sodium chloride Stopped (05/31/20 1259)  . sodium chloride 50 mL/hr at 06/11/20 1901  . cefTRIAXone (ROCEPHIN)  IV    . [START ON 06/13/2020] vancomycin    . vancomycin       LOS: 12 days     Kayleen Memos, MD [facility] Pager [phone]  If 7PM-7AM, please contact night-coverage [email or web address] Password [facility] 06/12/2020, 1:43 PM

## 2020-06-12 NOTE — Progress Notes (Signed)
    [facility]   Reason for visit: Follow up on discitis  Interval History: had IR aspiration, no growth to date. Sister not at bedside.  No complaints from the patient.    Physical Exam: Constitutional:  Vitals:   06/11/20 2014 06/12/20 0528  BP: (!) 104/59 110/72  Pulse: 91 70  Resp: 16 16  Temp: 98 F (36.7 C) 97.9 F (36.6 C)  SpO2: 98% 100%   patient appears in NAD Eyes: anicteric HENT: no thrush Skin: no rash  Review of Systems: Constitutional: negative for fevers and chills Gastrointestinal: negative for diarrhea  Lab Results  Component Value Date   WBC 5.0 06/11/2020   HGB 7.9 (L) 06/11/2020   HCT 24.9 (L) 06/11/2020   MCV 99.2 06/11/2020   PLT 421 (H) 06/11/2020    Lab Results  Component Value Date   CREATININE 0.47 06/11/2020   BUN 8 06/11/2020   NA 134 (L) 06/11/2020   K 3.8 06/11/2020   CL 102 06/11/2020   CO2 25 06/11/2020    Lab Results  Component Value Date   ALT 21 05/31/2020   AST 29 05/31/2020   ALKPHOS 34 (L) 05/31/2020     Microbiology: Recent Results (from the past 240 hour(s))  Aerobic/Anaerobic Culture (surgical/deep wound)     Status: None (Preliminary result)   Collection Time: 06/11/20 10:42 AM   Specimen: Fine Needle Aspirate  Result Value Ref Range Status   Specimen Description NEEDLE ASPIRATE  Final   Special Requests INERVERTEBRAL DISC L3 L4 ASPIRATE  Final   Gram Stain PENDING  Incomplete   Culture   Final    NO GROWTH < 24 HOURS Performed at [facility], [address] [place], [place] [address]    Report Status PENDING  Incomplete    Impression/Plan:  1. Discitis/osteomyelitis - s/p aspiration and no growth to date.  Will start vancomycin and ceftriaxone.  She will need antibiotics for 6 weeks.    2.  Access - will need picc line.  Likely need to coordinate for when her sister is present.   3.  Ischemic colitis - has been on antibiotics and diarrhea improved.  GI has signed  off and I have stopped Cipro/flagyl.

## 2020-06-12 NOTE — Progress Notes (Addendum)
Pharmacy Antibiotic Note  Nicole [name] is a 69 y.o. 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  She was recently admitted with a small bowel obstruction and diarrhea, discharged on 6/24.  She continued to have diarrhea after discharge, poor p.o. intake and was readmitted on 6/27 with shock, confusion.  S/p colonoscopy 7/2. GI noted Ischemic colitis slow to resolve. Diarrhea persisting. Starting  IV Cipro/Flagyl. Pharmacy has been consulted for Cipro dosing for segmental colitis.  Patient has been afebrile, WBC wnl. Renal function stable with SCr <0.5. Patient underwent L3-4 disc aspiration by IR on 7/9 with cultures pending.   Plan: Continue cipro 400 mg IV q12 hours Continue metronidazole 500 mg IV q8h F/u ID recommendations Pharmacy will sign off and monitor peripherally  Addendum 06/12/2020  12:40 PM : Pharmacy consulted to dose vancomycin for osteomyelitis. Pt has also been started on ceftriaxone per ID. Cipro and Flagyl have been discontinued.   Plan: Start vancomycin 1500 mg IV once for loading dose, then vancomycin 1 gm IV q12h per nomogram dosing Ceftriaxone 2 gm IV q24h per MD Monitor renal function, cultures, and clinical improvement Vanc trough at steady state   Height: 5\' 7"  (170.2 cm) Weight: 81.6 kg (179 lb 14.3 oz) IBW/kg (Calculated) : 61.6  Temp (24hrs), Avg:98 F (36.7 C), Min:97.9 F (36.6 C), Max:98 F (36.7 C)  Recent Labs  Lab 06/08/20 0721 06/10/20 0030 06/11/20 1113  WBC 5.0 6.8 5.0  CREATININE  --  0.45 0.47    Estimated Creatinine Clearance: 71.9 mL/min (by C-G formula based on SCr of 0.47 mg/dL).    Allergies  Allergen Reactions  . Seasonal Ic [Cholestatin]     Runny nose    Antimicrobials this admission: Vanc 6/28>>6/29, 7/10>> CTX 7/10>> Zosyn 6/28>>7/1 Ancef 6/27 x 1 Cipro 7/7>>7/10 Flagyl 7/7>>7/10  Microbiology results: 6/28 UCx: negative 6/28 GI panel:  negative 6/28 Cdiff: negative 6/28 MRSA PCR: positive 6/27 BCx: negative 7/9 Disc aspirate cx: pending   Thank you for allowing pharmacy to be a part of this patient's care.  9/9, PharmD PGY1 Pharmacy Resident  Please check AMION for all [facility] Pharmacy phone numbers After 10:00 PM, call Main Pharmacy [phone]  06/12/2020 8:12 AM

## 2020-06-13 LAB — CBC WITH DIFFERENTIAL/PLATELET
Abs Immature Granulocytes: 0.05 10*3/uL (ref 0.00–0.07)
Basophils Absolute: 0 10*3/uL (ref 0.0–0.1)
Basophils Relative: 1 %
Eosinophils Absolute: 0.1 10*3/uL (ref 0.0–0.5)
Eosinophils Relative: 3 %
HCT: 29.1 % — ABNORMAL LOW (ref 36.0–46.0)
Hemoglobin: 9.1 g/dL — ABNORMAL LOW (ref 12.0–15.0)
Immature Granulocytes: 1 %
Lymphocytes Relative: 21 %
Lymphs Abs: 1.1 10*3/uL (ref 0.7–4.0)
MCH: 31.7 pg (ref 26.0–34.0)
MCHC: 31.3 g/dL (ref 30.0–36.0)
MCV: 101.4 fL — ABNORMAL HIGH (ref 80.0–100.0)
Monocytes Absolute: 0.6 10*3/uL (ref 0.1–1.0)
Monocytes Relative: 11 %
Neutro Abs: 3.3 10*3/uL (ref 1.7–7.7)
Neutrophils Relative %: 63 %
Platelets: 375 10*3/uL (ref 150–400)
RBC: 2.87 MIL/uL — ABNORMAL LOW (ref 3.87–5.11)
RDW: 14.4 % (ref 11.5–15.5)
WBC: 5.2 10*3/uL (ref 4.0–10.5)
nRBC: 0 % (ref 0.0–0.2)

## 2020-06-13 LAB — BASIC METABOLIC PANEL
Anion gap: 7 (ref 5–15)
BUN: 14 mg/dL (ref 8–23)
CO2: 21 mmol/L — ABNORMAL LOW (ref 22–32)
Calcium: 8.5 mg/dL — ABNORMAL LOW (ref 8.9–10.3)
Chloride: 105 mmol/L (ref 98–111)
Creatinine, Ser: 0.45 mg/dL (ref 0.44–1.00)
GFR calc Af Amer: >60 mL/min (ref >60)
GFR calc non Af Amer: >60 mL/min (ref >60)
Glucose, Bld: 98 mg/dL (ref 70–99)
Potassium: 4.2 mmol/L (ref 3.5–5.1)
Sodium: 133 mmol/L — ABNORMAL LOW (ref 135–145)

## 2020-06-13 MED ORDER — CHLORHEXIDINE GLUCONATE CLOTH 2 % EX PADS
6.0000 | MEDICATED_PAD | Freq: Every day | CUTANEOUS | Status: DC
  Administered 2020-06-13 – 2020-06-17 (×5): 6 via TOPICAL

## 2020-06-13 MED ORDER — SODIUM CHLORIDE 0.9% FLUSH
10.0000 mL | Freq: Two times a day (BID) | INTRAVENOUS | Status: DC
  Administered 2020-06-13 – 2020-06-16 (×2): 10 mL

## 2020-06-13 MED ORDER — CEFTRIAXONE IV (FOR PTA / DISCHARGE USE ONLY): 2.0000 g | INTRAVENOUS | 0 refills | Status: AC

## 2020-06-13 MED ORDER — SODIUM CHLORIDE 0.9% FLUSH: 10.0000 mL | INTRAVENOUS | Status: DC | PRN

## 2020-06-13 MED ORDER — VANCOMYCIN IV (FOR PTA / DISCHARGE USE ONLY): 1000.0000 mg | Freq: Two times a day (BID) | INTRAVENOUS | 0 refills | Status: AC

## 2020-06-13 NOTE — Progress Notes (Signed)
PROGRESS NOTE  Nicole [name] MBW:[id number] DOB: [birth year] DOA: 05/30/2020 PCP: Vicenta Aly, [name]  HPI/Recap of past 43 hours: 69 year old female with history of seizure disorder, stroke with left hemiparesis, hypertension, hyperlipidemia presents from group home with persistent diarrhea and generalized weakness.  C. difficile PCR and GI pathogen negative.  CT of the abdomen and pelvis shows Colonic wall thickening with pericolonic edema and fat stranding involving the distal descending through sigmoid colon consistent with colitis, likely infectious or inflammatory.  Flex sigmoidoscopy showing ischemic colitis. Rectal tube was removed. She was started on cholestyramine and low fiber diet.  Repeat abdomen and pelvis on 06/07/2020 shows thrombus in the right lower lobe on the lung, no bowel perforation.  She was started on full dose Lovenox.  Venous duplex of the lower extremities revealed age indeterminate DVT involving the right common femoral vein and right proximal profunda vein.  In view of multiple medical problems, pt refusal to take heparin shots for DVT prophylaxis, refusing blood draws, refusing to eat, deconditioning, after extensive discussion with HCPOA, palliative care was consulted to assist with establishing goals of care.   Was started on IV antibiotics Cipro and Flagyl on 7/7 by GI for ischemic colitis with suspected diverticulitis and possible colonic ulcer as an etiology.  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  ID consulted.  Aspiration of L3-L4 completed on 06/11/2020 by interventional radiology.  Now on IV Vancomycin and rocephin empirically by ID.  06/13/20: Seen and examined.  Has no new complaints.  Updated patient's guardian, her sister, via phone on 06/12/2020.  All questions answered.  Patient will need 6 weeks of IV antibiotics.  Orders signed for  OPAT.  TOC assisting with disposition, likely discharge to group home on 06/14/2020.  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   History of stroke with residual deficit (left hemiparesis)   Hypermagnesemia   Hypercalcemia   Hyperkalemia   Hyperglycemia   MRSA carrier   Hyponatremia   Hypokalemia   Anemia of chronic disease   Constipation in female   At risk for aspiration related to tube feeding   Pleural effusion, bilateral   Acute pulmonary embolism (HCC)   Coronary artery disease   Right ovarian cyst   Aortic atherosclerosis (HCC)   Spinal stenosis   Ischemic colitis ([place])   Current non-adherence to medical treatment (refusing blood draws) while in hospital   Right leg DVT ([place])   DNR (do not resuscitate)   Palliative care by specialist   Diarrhea secondary to ischemic colitis, thought to be due to diverticulitis with possible colonic ulcer.  Seen by GI, post flex sigmoidoscopy/colonoscopy Path report from colonoscopy done on 06/04/2020 showed colonic mucosa and inflamed granulation tissue consistent with ulcer.  The differential diagnosis includes segmental colitis associated with diverticulitis.  Clinical correlation is essential. No diverticulitis seen on CT scan or colonoscopy.   C. difficile PCR  and GI panel negative Started on IV Cipro, IV Flagyl, cholestyramine, and gentle IV fluid hydration on 06/09/2020.  IV Flagyl and IV Cipro stopped on 06/12/2020 by ID. Continue cholestyramine 3 times daily as  recommended by GI.  Improving acute blood loss likely from GI losses in the setting of chronic normocytic anemia Hemoglobin stable 9.1 Sigmoidoscopy done on 06/04/2020: Altered vascular, congested, friable (with spontaneous bleeding), nodular, plaque covered, ulcerated and vascular-pattern-decreased mucosa in the sigmoid colon and in the descending colon. Biopsied. Continue to maintain hemoglobin greater than 7  Resolved nausea/vomiting No abdominal pain on exam, hypoactive bowel sounds present Continue supportive care as needed IV antiemetics as needed  Acute pulmonary embolus in the right lower lobe of the lung,(incidental finding on the CT of the abdomen pelvis)/right lower extremity DVT Started the patient on Lovenox injection as patient is refusing blood draws and would not be ideal to being on IV heparin.          Venous duplex of the lower extremities revealed age indeterminate DVT involving            the right common femoral vein and right proximal profunda vein.          Continue full dose Lovenox subcu twice daily Will need to follow-up with your PCP  L3-4 discitis on CT scan, confirmed by MRI of lumbar spine 06/07/20 post intervertebral L3-4 disc aspirate on 06/11/2020 by interventional radiology Independently viewed CT scan and MRI of lumbar spine showing evidence of L3-4 discitis. ID recommended aspiration by IR for cultures prior to initiating IV antibiotics Appreciate IDs assistance MRSA screen positive on 05/31/2020 Blood cultures drawn on 05/30/2020 - final. Pain management in place as needed Follow cultures from L3-4 disc aspirate Per ID will need 6 weeks of IV antibiotics IV vancomycin and Rocephin PICC line ordered, OPAT orders signed. TOC assisting with home health services Will need to follow-up with infectious disease  Seizure disorder Stable, no reported seizure activity Continue home Keppra and carbamazepine Follow-up with PCP or your  neurologist  Hyperlipidemia Continue Lipitor  History of CVA Continue statin  GERD Continue PPI  Physical debility PT OT assessment recommended SNF Sister prefers return to group home with home health PT OT TOC assisting with disposition Continue PT OT with assistance and fall precautions   Resolved hypokalemia  Post repletion Serum potassium 4.0> 4.2.   Deep tissue pressure injury of the heels, POA Pressure Injury 06/07/20 Heel Left Unstageable - Full thickness tissue loss in which the base of the injury is covered by slough (yellow, tan, gray, green or brown) and/or eschar (tan, brown or black) in the wound bed. (Active)  06/07/20 0800  Location: Heel  Location Orientation: Left  Staging: Unstageable - Full thickness tissue loss in which the base of the injury is covered by slough (yellow, tan, gray, green or brown) and/or eschar (tan, brown or black) in the wound bed.  Wound Description (Comments):   Present on Admission:      Pressure Injury 06/07/20 Heel Anterior;Right;Left Stage 1 -  Intact skin with non-blanchable redness of a localized area usually over a bony prominence. (Active)  06/07/20 0800  Location: Heel  Location Orientation: Anterior;Right;Left  Staging: Stage 1 -  Intact skin with non-blanchable redness of a localized area usually over a bony prominence.  Wound Description (Comments):   Present on Admission:   Wound care consulted and recommendations given.   DVT prophylaxis: Lovenox subcu, full dose, twice daily Code Status: DNR Family  Communication:  Updated sister via phone on 06/12/2020 Disposition:   Status is: Inpatient  Remains inpatient appropriate because:Ongoing diagnostic testing needed, pending this aspiration by IR and initiation of IV antibiotics   Dispo:             Patient From: Home             Planned Disposition: SNF             Expected discharge date: 06/14/20             Medically stable for discharge: No due to  ongoing evaluation and management of L3-4 discitis.        Consultants:   Gastroenterology.   ID  IR  Procedures:  Aspiration of L3-4 by interventional radiology.  Antimicrobials:  Pending.     Objective: Vitals:   06/12/20 0554 06/12/20 1546 06/12/20 2207 06/13/20 0523  BP:  134/71 112/62 121/76  Pulse:  78 84 77  Resp:  '17 14 16  '$ Temp:  98 F (36.7 C) 98.1 F (36.7 C) 97.8 F (36.6 C)  TempSrc:  Oral Oral Oral  SpO2:  100% 98% 99%  Weight: 81.6 kg     Height:        Intake/Output Summary (Last 24 hours) at 06/13/2020 1310 Last data filed at 06/13/2020 0829 Gross per 24 hour  Intake 970 ml  Output 2000 ml  Net -1030 ml   Filed Weights   06/05/20 0500 06/11/20 0638 06/12/20 0554  Weight: 88.6 kg 82.5 kg 81.6 kg    Exam:  . General: 69 y.o. year-old female chronically ill-appearing no acute distress.  Alert and interactive.   . Cardiovascular: Regular rate and rhythm no rubs or gallops.   Marland Kitchen Respiratory: Clear to auscultation no wheezes or rales.   . Abdomen: Obese Nontender Normal Bowel Sounds Present.   . Musculoskeletal: Trace lower extremity edema bilaterally.   Marland Kitchen Psychiatry: Mood is appropriate for condition and setting.   Data Reviewed: CBC: Recent Labs  Lab 06/08/20 0721 06/10/20 0030 06/11/20 1113 06/12/20 1656 06/13/20 0209  WBC 5.0 6.8 5.0 5.4 5.2  NEUTROABS  --  4.7 3.5 3.2 3.3  HGB 9.5* 8.5* 7.9* 10.0* 9.1*  HCT 29.7* 26.5* 24.9* 32.0* 29.1*  MCV 98.3 98.9 99.2 100.9* 101.4*  PLT 355 368 421* 399 948   Basic Metabolic Panel: Recent Labs  Lab 06/10/20 0030 06/11/20 1113 06/12/20 1656 06/13/20 0209  NA 132* 134* 132* 133*  K 4.0 3.8 4.2 4.2  CL 99 102 103 105  CO2 25 25 21* 21*  GLUCOSE 124* 109* 107* 98  BUN '10 8 10 14  '$ CREATININE 0.45 0.47 0.44 0.45  CALCIUM 8.6* 8.6* 8.4* 8.5*  MG 1.8  --   --   --   PHOS 2.7  --   --   --    GFR: Estimated Creatinine Clearance: 71.9 mL/min (by C-G formula based on SCr of  0.45 mg/dL). Liver Function Tests: No results for input(s): AST, ALT, ALKPHOS, BILITOT, PROT, ALBUMIN in the last 168 hours. No results for input(s): LIPASE, AMYLASE in the last 168 hours. No results for input(s): AMMONIA in the last 168 hours. Coagulation Profile: No results for input(s): INR, PROTIME in the last 168 hours. Cardiac Enzymes: No results for input(s): CKTOTAL, CKMB, CKMBINDEX, TROPONINI in the last 168 hours. BNP (last 3 results) No results for input(s): PROBNP in the last 8760 hours. HbA1C: No results for input(s): HGBA1C in the last 72 hours. CBG:  No results for input(s): GLUCAP in the last 168 hours. Lipid Profile: No results for input(s): CHOL, HDL, LDLCALC, TRIG, CHOLHDL, LDLDIRECT in the last 72 hours. Thyroid Function Tests: No results for input(s): TSH, T4TOTAL, FREET4, T3FREE, THYROIDAB in the last 72 hours. Anemia Panel: No results for input(s): VITAMINB12, FOLATE, FERRITIN, TIBC, IRON, RETICCTPCT in the last 72 hours. Urine analysis:    Component Value Date/Time   COLORURINE AMBER (A) 05/31/2020 1005   APPEARANCEUR HAZY (A) 05/31/2020 1005   LABSPEC 1.045 (H) 05/31/2020 1005   PHURINE 5.0 05/31/2020 1005   GLUCOSEU NEGATIVE 05/31/2020 1005   HGBUR MODERATE (A) 05/31/2020 1005   BILIRUBINUR NEGATIVE 05/31/2020 1005   KETONESUR NEGATIVE 05/31/2020 1005   PROTEINUR NEGATIVE 05/31/2020 1005   NITRITE NEGATIVE 05/31/2020 1005   LEUKOCYTESUR MODERATE (A) 05/31/2020 1005   Sepsis Labs: '@LABRCNTIP'$ (procalcitonin:4,lacticidven:4)  ) Recent Results (from the past 240 hour(s))  Aerobic/Anaerobic Culture (surgical/deep wound)     Status: None (Preliminary result)   Collection Time: 06/11/20 10:42 AM   Specimen: Fine Needle Aspirate  Result Value Ref Range Status   Specimen Description NEEDLE ASPIRATE  Final   Special Requests INERVERTEBRAL DISC L3 L4 ASPIRATE  Final   Gram Stain PENDING  Incomplete   Culture   Final    NO GROWTH 2 DAYS NO ANAEROBES  ISOLATED; CULTURE IN PROGRESS FOR 5 DAYS Performed at [facility], [place] [address] [place], [place] [address]    Report Status PENDING  Incomplete  Culture, fungus without smear     Status: None (Preliminary result)   Collection Time: 06/11/20 10:42 AM   Specimen: Fine Needle Aspirate  Result Value Ref Range Status   Specimen Description NEEDLE ASPIRATE  Final   Special Requests INTERVERTEBRAL DISC L3 L4 ASPIRATE  Final   Culture   Final    NO FUNGUS ISOLATED AFTER 2 DAYS Performed at [facility], [address], [place], [place] [address]    Report Status PENDING  Incomplete      Studies: [place] EKG SITE RITE  Result Date: 06/12/2020 If Site Rite image not attached, placement could not be confirmed due to current cardiac rhythm.   Scheduled Meds: . acetaminophen  1,000 mg Oral TID  . atorvastatin  10 mg Oral Q M,W,F  . budesonide  0.25 mg Inhalation BID  . calcium carbonate  1 tablet Oral TID WC  . carbamazepine  300 mg Oral BID  . Chlorhexidine Gluconate Cloth  6 each Topical Daily  . cholestyramine  4 g Oral TID  . enoxaparin (LOVENOX) injection  80 mg Subcutaneous Q12H  . feeding supplement  1 Container Oral TID BM  . feeding supplement (PRO-STAT SUGAR FREE 64)  30 mL Oral TID  . gabapentin  600 mg Oral QHS  . levETIRAcetam  500 mg Oral BID  . mouth rinse  15 mL Mouth Rinse BID  . multivitamin with minerals  1 tablet Oral Daily  . mupirocin ointment   Nasal BID  . pantoprazole  40 mg Oral Daily  . potassium chloride  40 mEq Oral Daily  . sodium chloride flush  10-40 mL Intracatheter Q12H  . sodium chloride flush  3 mL Intravenous Q12H    Continuous Infusions: . sodium chloride 10 mL/hr at 06/03/20 0800  . sodium chloride Stopped (05/31/20 1259)  . sodium chloride 50 mL/hr at 06/13/20 1309  . cefTRIAXone (ROCEPHIN)  IV 2 g (06/12/20 1402)  . vancomycin 1,000 mg (06/13/20 0146)  LOS: 13 days     Kayleen Memos, MD [facility] Pager  [phone]  If 7PM-7AM, please contact night-coverage [email or web address] Password [email or web address] 06/13/2020, 1:10 PM

## 2020-06-13 NOTE — TOC Transition Note (Signed)
Transition of Care [facility]) - CM/SW Discharge Note   Patient Details  Name: Nicole [name] MRN: [id number] Date of Birth: 05/26/1951  Transition of Care [facility]) CM/SW Contact:  Deveron Furlong, RN 06/13/2020, 10:55 AM   Clinical Narrative:    Patient to d/c back to group home.  Dr. Margo Aye requested arrangement be made for d/c today, but now will delay until tomorrow as sister, Nicole [name], and Group Home Caretaker, Nicole [name] were not ready for patient to d/c today.  Per sister, Nicole [name] is a CNA and will be the person to learn/assist with IV antibiotics.  Nicole [name] and Nicole [name] inquired about getting a [place]- they are advised to f/u with insurance company and [facility] agency to assist as we do not arrange this from the hospital. The patient received all other necessary DME when d/c last time.  Patient will need to be transported via [facility]. The group home address is [address]    Jeri Modena with [facility] is aware of patient and will f/u for d/c tomorrow.  Patient can likely d/c after 2pm doses of Vancomycin and Ceftriaxone to start home infusions Tuesday morning.  Angela Adam with [facility] advised of patient need for IV antibiotics.  He will arrange RN visits.  Nicole [name] requests that a different therapist visit from agency.  This is relayed to [facility].  The patient may d/c on Lovenox injections per Dr. Margo Aye.  This will require further discussion with Nicole [name] and Nicole [name].     Final next level of care: Group Home (w/ HH) Barriers to Discharge:  (Home health needs)   Patient Goals and CMS Choice Patient states their goals for this hospitalization and ongoing recovery are:: return to group home with home health CMS Medicare.gov Compare Post Acute Care list provided to:: Patient Represenative (must comment) Choice offered to / list presented to : [facility] POA / Guardian, Sibling   Discharge Plan and Services In-house Referral: Clinical Social Work Discharge Planning Services: CM Consult Post Acute  Care Choice: Resumption of Paediatric nurse, Home Health                    HH Arranged: PT, OT, RN [facility] Agency: [facility], [facility] Date [facility] Agency Contacted: 06/13/20 Time HH Agency Contacted: 0900 Representative spoke with at [facility] Agency: Angela Adam, Jeri Modena  Social Determinants of Health (SDOH) Interventions     Readmission Risk Interventions Readmission Risk Prevention Plan 06/04/2020  Post Dischage Appt Complete  Medication Screening Complete  Transportation Screening Complete  Some recent data might be hidden

## 2020-06-13 NOTE — Progress Notes (Signed)
PHARMACY CONSULT NOTE FOR:  OUTPATIENT  PARENTERAL ANTIBIOTIC THERAPY (OPAT)  Indication: discitis Regimen: Vancomycin 1 gm IV q12h, Ceftriaxone 2 gm IV q24h End date: 07/25/2020  IV antibiotic discharge orders are pended. To discharging provider:  please sign these orders via discharge navigator,  Select New Orders & click on the button choice - Manage This Unsigned Work.     Thank you for allowing pharmacy to be a part of this patient's care.  Lulu Riding, PharmD PGY2 Pharmacy Resident Phone between 7 am - 3:30 pm: [phone]  Please check AMION for all [facility] Pharmacy phone numbers After 10:00 PM, call Main Pharmacy [phone]  06/13/2020, 7:11 AM

## 2020-06-13 NOTE — Progress Notes (Signed)
  Peripherally Inserted Central Catheter Placement  The IV Nurse has discussed with the patient and/or persons authorized to consent for the patient, the purpose of this procedure and the potential benefits and risks involved with this procedure.  The benefits include less needle sticks, lab draws from the catheter, and the patient may be discharged home with the catheter. Risks include, but not limited to, infection, bleeding, blood clot (thrombus formation), and puncture of an artery; nerve damage and irregular heartbeat and possibility to perform a PICC exchange if needed/ordered by physician.  Alternatives to this procedure were also discussed.  Bard Power PICC patient education guide, fact sheet on infection prevention and patient information card has been provided to patient /or left at bedside. Consent given to patient and legal guardian, Channing Mutters.   PICC Placement Documentation  PICC Single Lumen 06/13/20 PICC Right Basilic 38 cm 0 cm (Active)  Indication for Insertion or Continuance of Line Home intravenous therapies (PICC only) 06/13/20 1200  Exposed Catheter (cm) 0 cm 06/13/20 1200  Site Assessment Clean;Dry;Intact 06/13/20 1200  Line Status Flushed;Blood return noted;Saline locked 06/13/20 1200  Dressing Type Transparent 06/13/20 1200  Dressing Status Clean;Dry;Intact;Antimicrobial disc in place 06/13/20 1200  Safety Lock Not Applicable 06/13/20 1200  Line Care Connections checked and tightened 06/13/20 1200  Line Adjustment (NICU/IV Team Only) No 06/13/20 1200  Dressing Intervention New dressing 06/13/20 1200  Dressing Change Due 06/20/20 06/13/20 1200       Edwin Cap 06/13/2020, 12:38 PM

## 2020-06-13 NOTE — Discharge Instructions (Signed)
Ischemic Colitis  Ischemic colitis is damage to the large intestine due to reduced blood flow (ischemia) to the colon. The colon is the last section of the large intestine, where stool is formed. The reduced blood flow may lead to the death of cells (necrosis) in the lining of the colon, damaging the colon and often causing bleeding. Most cases of ischemic colitis clear up in a few days with treatment. In other cases, blood flow does not improve, and parts of the colon start to die. This is extremely serious and even life-threatening. If this happens, surgery may be required. In some cases, parts of the colon may need to be removed. What are the causes? Ischemic colitis results from a decrease in the blood supply to the colon. Many conditions can cause this, such as:  Heart problems that reduce blood flow to the arteries that supply the colon. These include problems such as coronary heart disease, peripheral vascular disease, atrial fibrillation, and congestive heart failure.  Low blood pressure from: ? An infection that spreads to the blood (sepsis). ? Dehydration or bleeding (shock).  Drugs that narrow blood vessels (vasoconstrictors). Sometimes the cause is not known. What increases the risk? You are more likely to develop this condition if:  You are 72 years of age or older.  You are female.  You have another medical condition, such as: ? Heart disease. ? Diabetes. ? Kidney disease that requires you to be on dialysis. ? A disease that causes blood clots.  You are frequently constipated.  You have had surgery on the heart, blood vessels (such as the aorta), or colon.  You take certain medicines or drugs, such as: ? Medicines that suppress your immune system (immunomodulators). ? Medicines that cause constipation. ? Illegal drugs, such as cocaine or methamphetamines.  You get an extreme amount of exercise from long-distance bike riding or running. What are the signs or  symptoms? Symptoms of this condition start suddenly and may include:  Dull pain, usually on the left side of the abdomen.  Tenderness of the abdomen.  Abdomen (abdominal) cramps.  An urgent need to have a bowel movement.  Loose, bloody stools with clots of dark or bright red blood.  Nausea and vomiting.  Fever.  Weakness, fatigue, and confusion. How is this diagnosed? This condition may be diagnosed based on:  Your symptoms, your medical history, and a physical exam.  Tests to find out more about your condition and to rule out other causes of pain and bleeding. These tests may include: ? Blood tests to check for clotting, blood loss, and low proteins in your blood. ? CT scan of the colon. ? A procedure to examine the inside of your colon using a scope that is passed through the rectum (colonoscopy). Colonoscopy is the most important diagnostic test. During this test, your health care provider may take a small piece of tissue from your colon to be examined under a microscope (biopsy). How is this treated? You may be hospitalized for treatment. Treatment usually includes:  Not eating or drinking anything. This allows the colon to rest.  IV fluids to maintain blood pressure, regulate blood minerals (electrolytes), and provide nutrition.  Having a tube inserted into your stomach through your nose (nasogastric tube) to drain your stomach.  IV antibiotic medicines. These may be used if an infection is suspected.  Stopping or changing medicines that may be causing the condition. You may need surgery if your condition is severe or if it gets worse  or does not get better after a few days. Parts of the colon that will not recover may need to be removed. In some cases, a procedure is also done to attach the healthy part of the colon to the outer wall of the abdomen to drain stool (colostomy). Follow these instructions at home:  Follow instructions from your health care provider about  eating or drinking restrictions.  Drink enough fluid to keep your urine clear or pale yellow.  Take over-the-counter and prescription medicines only as told by your health care provider.  Return to your normal activities as told by your health care provider. Ask your health care provider what activities are safe for you.  Do not use any products that contain nicotine or tobacco, such as cigarettes and e-cigarettes. If you need help quitting, ask your health care provider.  Keep all follow-up visits as told by your health care provider. This is important. Contact a health care provider if:  You have blood in your stool.  You have abdominal pain or cramps.  You have constipation.  You have nausea or vomiting. Get help right away if:  You have a moderate to large amount of loose, bloody stools with clots of dark or bright red blood.  You have severe abdominal pain.  Your abdominal pain has not improved after 24 hours.  You have a fever.  You have not been able to have a bowel movement, and you are in pain and vomiting.  You have shortness of breath.  You are very tired (lethargic) or have confusion. Summary  Ischemic colitis is damage to the large intestine due to reduced blood flow (ischemia) to the colon.  Some of the symptoms of this condition include abdominal pain or tenderness, bloody stools, and an urgent need to have a bowel movement.  Diagnosis usually includes a procedure to examine the inside of the colon using a scope that is passed through the rectum (colonoscopy). This information is not intended to replace advice given to you by your health care provider. Make sure you discuss any questions you have with your health care provider. Document Revised: 03/14/2019 Document Reviewed: 01/08/2017 Elsevier Patient Education  2020 [employer] Diskitis  Diskitis is the inflammation and infection of the disks in the spine. Disks are soft structures that cushion the  bones of the spine (vertebrae). This is not a common condition. Diskitis most often affects the disks of the lower back (lumbar disks). It may also affect disks in the neck (cervical disks) or upper back (thoracic disks). What are the causes? This condition may be caused by:  An infection that spreads from an infection in another part of the body through the bloodstream. The most common sources are the skin, soft tissue, urinary tract, and respiratory tract.  An infection in another area of the spine. When diskitis develops, it is often accompanied by infection-induced inflammation of the bones (osteomyelitis) surrounding the spine.  An infection after a procedure or trauma to the spine. This may include surgery, lumbar puncture, a nerve block, and epidural analgesia. What increases the risk? People at higher risk of developing this condition include:  Children.  People with a weak disease-fighting system (immune system) or immune system disorders.  People with diabetes.  People with cancer.  People with HIV or AIDS.  People who use intravenous drugs.  People with liver or kidney disease. What are the signs or symptoms? Back pain or stomach pain is the most common symptom of diskitis. Walking,  standing, and sitting may be painful. Children may refuse to crawl or walk. Other symptoms may include:  Back stiffness.  Trouble standing or rising from a sitting position.  Irritability.  Fever. How is this diagnosed? This condition may be diagnosed based on:  Your symptoms and medical history.  A physical exam.  Imaging tests, such as: ? X-ray of the spine. ? MRI of the spine. ? A bone scan.  Blood tests. How is this treated? This condition may be treated with:  Bed rest for a short time.  Medicines, such as: ? Antibiotics to treat a possible bacterial infection. ? Anti-inflammatory medicines. ? Pain-relieving medicines.  A brace to keep your back from  moving.  Surgery. Surgery is not normally needed but may be used if the infection is severe or if rest and medicines do not help. Follow these instructions at home: If you have a brace:  Wear the brace as told by your health care provider. Remove it only as told by your health care provider.  Keep the brace clean.  If the brace is not waterproof: ? Do not let it get wet. ? Cover it with a watertight covering when you take a bath or a shower. Medicines  Take over-the-counter and prescription medicines only as told by your health care provider.  If you were prescribed an antibiotic medicine, take it as told by your health care provider. Do not stop taking the antibiotic even if you start to feel better.  Do not drive or use heavy machinery while taking prescription pain medicine.  If you are taking prescription pain medicine, take actions to prevent or treat constipation. Your health care provider may recommend that you: ? Drink enough fluid to keep your urine pale yellow. ? Eat foods that are high in fiber, such as fresh fruits and vegetables, whole grains, and beans. ? Limit foods that are high in fat and processed sugars, such as fried or sweet foods. ? Take an over-the-counter or prescription medicine for constipation. General instructions  Rest as told by your health care provider. Return to your normal activities as told by your health care provider.  Do not use any products that contain nicotine or tobacco, such as cigarettes and e-cigarettes. These can delay healing during treatment. If you need help quitting, ask your health care provider.  Keep all follow-up visits as told by your health care provider. This is important. Contact a health care provider if you:  Have difficulty walking or standing.  Have persistent back pain that is not relieved by medicines.  Are having side effects from medicines.  Develop a fever. Get help right away if you:  Lose feeling in your  legs or arms, or your legs or arms become weak.  Have numbness or tingling in your legs or arms.  Lose control of bowel or bladder function (incontinence). Summary  Disks are soft structures that cushion the bones of the spine.  Diskitis is a rare condition that causes inflammation and infection of the disks in the spine, especially the lower back (lumbar disks).  One cause of diskitis is an infection that spreads from an infection in another part of the body through the bloodstream.  Back pain or stomach pain is the most common symptom of diskitis. Walking, standing, and sitting may be painful.  Treatment may include bed rest, antibiotic medicine, anti-inflammatory medicine, pain-relieving medicine, a brace to keep your back from moving, or surgery. This information is not intended to replace advice given  to you by your health care provider. Make sure you discuss any questions you have with your health care provider. Document Revised: 11/05/2017 Document Reviewed: 11/05/2017 Elsevier Patient Education  2020 [employer].

## 2020-06-13 NOTE — Progress Notes (Signed)
Spoke with Murtis Sink, RN concerning PICC placement. Discharge orders in and awaiting for sisters to arrive sign consent.

## 2020-06-14 LAB — FUNGUS STAIN

## 2020-06-14 LAB — VANCOMYCIN, TROUGH: Vancomycin Tr: 16 ug/mL (ref 15–20)

## 2020-06-14 MED ORDER — BUSPIRONE HCL 15 MG PO TABS
15.0000 mg | ORAL_TABLET | Freq: Two times a day (BID) | ORAL | Status: DC
  Administered 2020-06-14 – 2020-06-17 (×7): 15 mg via ORAL
  Filled 2020-06-14 (×7): qty 1

## 2020-06-14 MED ORDER — GABAPENTIN 300 MG PO CAPS: 600.0000 mg | ORAL_CAPSULE | Freq: Every day | ORAL | 0 refills | Status: AC

## 2020-06-14 MED ORDER — PRO-STAT SUGAR FREE PO LIQD: 30.0000 mL | Freq: Three times a day (TID) | ORAL | 0 refills | Status: AC

## 2020-06-14 MED ORDER — PULMICORT FLEXHALER 180 MCG/ACT IN AEPB: 1.0000 | INHALATION_SPRAY | Freq: Two times a day (BID) | RESPIRATORY_TRACT | 0 refills | Status: DC

## 2020-06-14 MED ORDER — BUDESONIDE 0.25 MG/2ML IN SUSP: 0.2500 mg | Freq: Two times a day (BID) | RESPIRATORY_TRACT | Status: DC | PRN

## 2020-06-14 MED ORDER — CHOLESTYRAMINE 4 G PO PACK: 4.0000 g | PACK | Freq: Three times a day (TID) | ORAL | 0 refills | Status: AC

## 2020-06-14 MED ORDER — PULMICORT FLEXHALER 180 MCG/ACT IN AEPB: 1.0000 | INHALATION_SPRAY | Freq: Two times a day (BID) | RESPIRATORY_TRACT | 0 refills | Status: AC | PRN

## 2020-06-14 MED ORDER — ENOXAPARIN SODIUM 80 MG/0.8ML ~~LOC~~ SOLN: 80.0000 mg | Freq: Two times a day (BID) | SUBCUTANEOUS | 0 refills | Status: DC

## 2020-06-14 MED ORDER — APIXABAN (ELIQUIS) VTE STARTER PACK (10MG AND 5MG)
ORAL_TABLET | ORAL | 0 refills | Status: DC
Start: 2020-06-14 — End: 2020-06-14

## 2020-06-14 MED ORDER — ENOXAPARIN SODIUM 80 MG/0.8ML ~~LOC~~ SOLN: 80.0000 mg | Freq: Two times a day (BID) | SUBCUTANEOUS | 0 refills | Status: AC

## 2020-06-14 MED ORDER — SACCHAROMYCES BOULARDII 250 MG PO CAPS
250.0000 mg | ORAL_CAPSULE | Freq: Two times a day (BID) | ORAL | Status: DC
  Administered 2020-06-14 – 2020-06-17 (×7): 250 mg via ORAL
  Filled 2020-06-14 (×7): qty 1

## 2020-06-14 NOTE — Consult Note (Signed)
WOC Nurse wound follow up Patient receiving care in [facility] 6N06.  Bilateral prevalon heel lift boots on feet. Wound type: DTPI to left lateral heel Measurement: 4.2 cm x 1.6 cm Wound bed: dry, maroon. Overlying tissue intact Drainage (amount, consistency, odor) none Periwound: intact Dressing procedure/placement/frequency: Continue twice daily application of iodine and heel lift boots. Monitor the wound area(s) for worsening of condition such as: Signs/symptoms of infection,  Increase in size,  Development of or worsening of odor, Development of pain, or increased pain at the affected locations.  Notify the medical team if any of these develop. Helmut Muster, RN, MSN, CWOCN, CNS-BC, pager [phone]

## 2020-06-14 NOTE — Progress Notes (Signed)
Pharmacy Antibiotic Note  Nicole [name] is a 69 y.o. 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 She is s/p IR aspiration of her lumbar disc on 7/09. Cultures from her aspiration are no growth to date.   Patient has been afebrile, WBC wnl. Renal function stable with SCr <0.5.  Vancomycin trough this afternoon resulted as 16 about 12 hours after the last dose. This is a therapeutic level so will continue the same dose.    Plan: Continue Vancomycin 1000 mg every 12 hours  Ceftriaxone 2 gm IV q24h per MD Monitor renal function, cultures, and clinical improvement OPAT completed and signed- No updates needed  Height: 5\' 7"  (170.2 cm) Weight: 81.6 kg (179 lb 14.3 oz) IBW/kg (Calculated) : 61.6  Temp (24hrs), Avg:98.3 F (36.8 C), Min:97.5 F (36.4 C), Max:99.1 F (37.3 C)  Recent Labs  Lab 06/08/20 0721 06/10/20 0030 06/11/20 1113 06/12/20 1656 06/13/20 0209 06/14/20 1330  WBC 5.0 6.8 5.0 5.4 5.2  --   CREATININE  --  0.45 0.47 0.44 0.45  --   VANCOTROUGH  --   --   --   --   --  16    Estimated Creatinine Clearance: 71.9 mL/min (by C-G formula based on SCr of 0.45 mg/dL).    Allergies  Allergen Reactions  . Seasonal Ic [Cholestatin]     Runny nose    Antimicrobials this admission: Vanc 6/28>>6/29, 7/10>> CTX 7/10>> Zosyn 6/28>>7/1 Ancef 6/27 x 1 Cipro 7/7>>7/10 Flagyl 7/7>>7/10  Microbiology results: 6/28 UCx: negative 6/28 GI panel: negative 6/28 Cdiff: negative 6/28 MRSA PCR: positive 6/27 BCx: negative 7/9 Disc aspirate cx: NGTD    Thank you for allowing pharmacy to be a part of this patient's care.   9/9, PharmD, BCPS, BCIDP Infectious Diseases Clinical Pharmacist Phone: [phone] Please check AMION for all [facility] phone numbers After 10:00 PM, call Main Pharmacy [phone]  06/14/2020 2:20 PM

## 2020-06-14 NOTE — Care Management (Signed)
Entered benefit check for Eliquis 5 mg PO BID.   Will p rovide 30 day free card.  Ronny Flurry RN

## 2020-06-14 NOTE — Progress Notes (Signed)
Discharge delayed as patient's sister and facility not ready to receive the patient.   Patient is medically cleared for discharge.  Met with the patient's sister at bedside.  Answered all her questions.  Also answered April's questions at group home via phone.  Appreciate TOC assistance with disposition.

## 2020-06-14 NOTE — Progress Notes (Signed)
Diagnosis: Discitis/osteomyelitis  Culture Result: negative  Allergies  Allergen Reactions  . Seasonal Ic [Cholestatin]     Runny nose    OPAT Orders Discharge antibiotics to be given via PICC line Discharge antibiotics: Per pharmacy protocol yes Aim for Vancomycin trough 15-20 or AUC 400-550 (unless otherwise indicated) Duration: 6 weeks End Date: 07/25/20  [facility] Care Per Protocol: yes  Home health RN for IV administration and teaching; PICC line care and labs.    Labs weekly while on IV antibiotics: _x_ CBC with differential _x_ BMP - twice weekly __ CMP _x_ CRP _x_ ESR _x_ Vancomycin trough - twice weekly __ CK  _x_ Please pull PIC at completion of IV antibiotics __ Please leave PIC in place until doctor has seen patient or been notified  Fax weekly labs to [phone]  Clinic Follow Up Appt: 07/13/20 video visit  @ 4 pm

## 2020-06-14 NOTE — Discharge Summary (Signed)
Discharge Summary  Nicole [name] QIO:[id number] DOB: 07/06/1951  PCP: Vicenta Aly, [name]  Admit date: 05/30/2020 Discharge date: 06/14/2020  Time spent: 35 minutes  Recommendations for Outpatient Follow-up:  1. Follow-up with infectious disease 2. Follow-up with GI 3. Follow-up with your primary care provider 4. Take your medications as prescribed  Discharge Diagnoses:  Active Hospital Problems   Diagnosis Date Noted  . Sepsis ([place]) (SIRS criteria met T max 101.9, SBP < 100, HR 103, with colitis/cellulitis as source, evidence of end-organ dysfunction w/ SpO288% on admission) 05/31/2020  . 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 History of stroke with residual deficit (left hemiparesis) 06/09/2020  . Hypermagnesemia 06/09/2020  . Hypercalcemia 06/09/2020  . Hyperkalemia 06/09/2020  . Hyperglycemia 06/09/2020  . MRSA carrier 06/09/2020  . Hyponatremia 06/09/2020  . Hypokalemia 06/09/2020  . Anemia of chronic disease 06/09/2020  . Constipation in female 06/09/2020  . At risk for aspiration related to tube feeding 06/09/2020  . Pleural effusion, bilateral 06/09/2020  . Acute pulmonary embolism ([place]) 06/09/2020  . Coronary artery disease 06/09/2020  . Right ovarian cyst 06/09/2020  . Aortic atherosclerosis ([place]) 06/09/2020  . Spinal stenosis 06/09/2020  . Ischemic colitis ([place]) 06/09/2020  . Current non-adherence to medical treatment (refusing blood draws) while in hospital 06/09/2020  . Right leg DVT ([place]) 06/09/2020  . DNR (do not resuscitate) 06/09/2020  . Palliative care by specialist 06/09/2020  . Discitis 06/08/2020  . Pressure injury of skin: Left heel unstageable; Right heel stage I 06/07/2020  . Hypomagnesemia 05/31/2020  . Cellulitis 05/31/2020  . Ileus ([place]) 05/26/2020  . Altered mental status 05/17/2020  . Diarrhea   . Dehydration   . Seizures ([place])   . Hypertension   . Hyperlipidemia   . AKI (acute kidney injury) [facility])     Resolved  Hospital Problems   Diagnosis Date Noted Date Resolved  . Colitis 05/31/2020 06/09/2020    Discharge Condition: Stable  Diet recommendation: Soft diet with thin liquid  Vitals:   06/13/20 2106 06/14/20 0550  BP: 115/62 118/72  Pulse: 96 93  Resp: 18 17  Temp: 98.5 F (36.9 C) 99.1 F (37.3 C)  SpO2: 97% 98%    History of present illness:  69 year old female with history of seizure disorder, stroke with left hemiparesis, hypertension, hyperlipidemia presents from group home with persistent diarrhea and generalized weakness. C. difficile PCR and GI pathogen negative. CT of the abdomen and pelvis shows Colonic wall thickening with pericolonic edema and fat stranding involving the distal descending through sigmoid colon consistent with colitis, likely infectious or inflammatory.  Flex sigmoidoscopy showing ischemic colitis. Rectal tube was removed. She was started on cholestyramine and low fiber diet.Repeat abdomen and pelvis on 06/07/2020 shows thrombus in the right lower lobe on the lung, no bowel perforation. She was started on full dose Lovenox.  Venous duplex of the lower extremities revealed age indeterminate DVT involving the right common femoral vein and right proximal profunda vein.  In view of multiple medical problems, pt refusal to take heparin shots for DVT prophylaxis, refusing blood draws, refusing to eat, deconditioning, after extensive discussion with HCPOA, palliative care was consulted to assist with establishing goals of care.  Was started on IV antibiotics Cipro and Flagyl on 7/7 by GI for ischemic colitis with suspected diverticulitis and possible colonic ulcer as an etiology.  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  ID consulted.  Aspiration of L3-L4 completed on 06/11/2020 by interventional radiology.  Now on IV Vancomycin and rocephin  empirically by ID.  Patient will need 6 weeks of IV antibiotics.  Orders signed for OPAT.  TOC assisting with disposition, likely discharge to group home on 06/14/2020.  06/14/20: Seen and examined at bedside.  No acute events overnight.  She has no new complaints.   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   History of stroke with residual deficit (left hemiparesis)   Hypermagnesemia   Hypercalcemia   Hyperkalemia   Hyperglycemia   MRSA carrier   Hyponatremia   Hypokalemia   Anemia of chronic disease   Constipation in female   At risk for aspiration related to tube feeding   Pleural effusion, bilateral   Acute pulmonary embolism ([name])   Coronary artery disease   Right ovarian cyst   Aortic atherosclerosis ([name])   Spinal stenosis   Ischemic colitis ([name])   Current non-adherence to medical treatment (refusing blood draws) while in hospital   Right leg DVT ([name])   DNR (do not resuscitate)   Palliative care by specialist  Diarrhea, improving, secondary to ischemic colitis, thought to be due to diverticulitis with possible colonic ulcer.  Seen by GI, post flex sigmoidoscopy/colonoscopy Path report from colonoscopy done on 06/04/2020 showed colonic mucosa and inflamed granulation tissue consistent with ulcer.  The differential diagnosis includes segmental colitis associated with diverticulitis.  Clinical correlation is essential. No diverticulitis seen on CT scan or colonoscopy.   C. difficile PCR and GI panel  negative Started on IV Cipro, IV Flagyl, cholestyramine, and gentle IV fluid hydration on 06/09/2020.  IV Flagyl and IV Cipro stopped on 06/12/2020 by ID. Continue cholestyramine 3 times daily as recommended by GI. Follow-up with GI outpatient.  Improving acute blood loss likely from GI losses in the setting of chronic normocytic anemia Hemoglobin stable 9.1 Sigmoidoscopy done on 06/04/2020: Altered vascular, congested, friable (with spontaneous bleeding), nodular, plaque covered, ulcerated and vascular-pattern-decreased mucosa in the sigmoid colon and in the descending colon. Biopsied with findings as reported above. Continue to maintain hemoglobin greater than 7 Follow-up with your PCP and GI  Resolved nausea/vomiting No abdominal pain on exam, bowel sounds present. Continue supportive care as needed Denies any nausea at the time of this visit. No use of antiemetics in the last 3 days  Acute pulmonary embolus in the right lower lobe of the lung,(incidental finding on the CT of the abdomen pelvis)/right lower extremity DVT Continue full dose subcu Lovenox twice daily. Was unable to use a DOAC agent due to interaction with Tegretol, decreasing the effect of the anticoagulation. Follow-up with your PCP  L3-4 discitis on CT scan, confirmed by MRI of lumbar spine 06/07/20 post intervertebral L3-4 disc aspirate on 06/11/2020 by interventional radiology Independently viewed CT scan and MRI of lumbar spine showing evidence of L3-4 discitis. MRSA screen positive on 05/31/2020 Blood cultures drawn on 05/30/2020 - final. Continue to follow  cultures from L3-4 disc aspirate Per ID will need 6 weeks of IV antibiotics IV vancomycin and Rocephin PICC line ordered, OPAT orders signed. TOC assisting with home health services Will need to follow-up with infectious disease outpatient.  Seizure disorder Stable, no reported seizure activity Continue home Keppra and carbamazepine Follow-up with PCP or your  neurologist  Hyperlipidemia Continue Lipitor  History of CVA Continue statin  GERD Continue PPI  Physical debility PT OT assessment recommended SNF Sister prefers return to group home with home health PT OT TOC assisting with disposition Continue PT OT with assistance and fall precautions   Resolved hypokalemia  Post repletion Serum potassium 4.0> 4.2.  Right ovarian cyst Diagnosed by CT on 06/07/2020 Recommend 6 months follow-up of pelvic ultrasound Follow-up with your PCP with possible referral with a gynecologist  Remote sacral fracture Seen on CT scan Follow-up with your PCP     Deep tissue pressure injury of the heels, POA Pressure Injury 06/07/20 Heel Left Unstageable - Full thickness tissue loss in which the base of the injury is covered by slough (yellow, tan, gray, green or brown) and/or eschar (tan, brown or black) in the wound bed. (Active)  06/07/20 0800  Location: Heel  Location Orientation: Left  Staging: Unstageable - Full thickness tissue loss in which the base of the injury is covered by slough (yellow, tan, gray, green or brown) and/or eschar (tan, brown or black) in the wound bed.  Wound Description (Comments):   Present on Admission:     Pressure Injury 06/07/20 Heel Anterior;Right;Left Stage 1 - Intact skin with non-blanchable redness of a localized area usually over a bony prominence. (Active)  06/07/20 0800  Location: Heel  Location Orientation: Anterior;Right;Left  Staging: Stage 1 - Intact skin with non-blanchable redness of a localized area usually over a bony prominence.  Wound Description (Comments):   Present on Admission:  Wound care consulted and recommendations given.    Code Status:DNR     Procedures:  Intervertebral disc aspiration at L3-4 by interventional radiology  Consultations:  GI  Infectious disease  Interventional radiology  Discharge Exam: BP 118/72 (BP Location: Left Arm)   Pulse 93   Temp  99.1 F (37.3 C) (Oral)   Resp 17   Ht _0  (1.702 m)   Wt 81.6 kg   SpO2 98%   BMI 28.18 kg/m  . General: 69 y.o. year-old female well developed well nourished in no acute distress.  Alert and interactive. . Cardiovascular: Regular rate and rhythm with no rubs or gallops.  No thyromegaly or JVD noted.   Marland Kitchen Respiratory: Clear to auscultation with no wheezes or rales. Good inspiratory effort. . Abdomen: Soft nontender nondistended with normal bowel sounds x4 quadrants. . Musculoskeletal: Trace lower extremity edema bilaterally. Marland Kitchen Psychiatry: Mood is appropriate for condition and setting  Discharge Instructions You were cared for by a hospitalist during your hospital stay. If you have any questions about your discharge medications or the care you received while you were in the hospital after you are discharged, you can call the unit and asked to speak with the hospitalist on call if the hospitalist that took care of you is not available. Once you are discharged, your primary care physician will handle any further medical issues. Please note that NO REFILLS for any discharge medications will be authorized once you are discharged, as it is imperative that you return to your primary care physician (or establish a relationship with a primary care physician if you do  not have one) for your aftercare needs so that they can reassess your need for medications and monitor your lab values.  Discharge Instructions    [facility] pharmacist to adjust dose for Vancomycin, Aminoglycosides and other anti-infective therapies as requested by physician.   Complete by: As directed    [facility] to provide Cath Flo 54m   Complete by: As directed    Administer for PICC line occlusion and as ordered by physician for other access device issues.   Anaphylaxis Kit: Provided to treat any anaphylactic reaction to the medication being provided to the patient if First Dose or when requested by  physician   Complete by: As directed    Epinephrine 171mml vial / amp: Administer 0.30m330m0.30ml17mubcutaneously once for moderate to severe anaphylaxis, nurse to call physician and pharmacy when reaction occurs and call 911 if needed for immediate care   Diphenhydramine 50mg67mIV vial: Administer 25-50mg 66mM PRN for first dose reaction, rash, itching, mild reaction, nurse to call physician and pharmacy when reaction occurs   Sodium Chloride 0.9% NS 500ml I58mdminister if needed for hypovolemic blood pressure drop or as ordered by physician after call to physician with anaphylactic reaction   Change dressing on IV access line weekly and PRN   Complete by: As directed    Flush IV access with Sodium Chloride 0.9% and Heparin 10 units/ml or 100 units/ml   Complete by: As directed    Home infusion instructions - [facility]   Complete by: As directed    Instructions: Flush IV access with Sodium Chloride 0.9% and Heparin 10units/ml or 100units/ml   Change dressing on IV access line: Weekly and PRN   Instructions Cath Flo 2mg: Ad830mister for PICC Line occlusion and as ordered by physician for other access device   [facility] pharmacist to adjust dose for: Vancomycin, Aminoglycosides and other anti-infective therapies as requested by physician   Method of administration may be changed at the discretion of home infusion pharmacist based upon assessment of the patient and/or caregiver's ability to self-administer the medication ordered   Complete by: As directed      Allergies as of 06/14/2020      Reactions   Seasonal Ic [cholestatin]    Runny nose      Medication List    STOP taking these medications   diclofenac 75 MG EC tablet Commonly known as: VOLTAREN     TAKE these medications   atorvastatin 10 MG tablet Commonly known as: LIPITOR Take 10 mg by mouth every Monday, Wednesday, and Friday.   Benefiber Chew Chew 1 tablet by mouth daily.   busPIRone 15 MG  tablet Commonly known as: BUSPAR Take 15 mg by mouth 2 (two) times daily.   calcium carbonate 600 MG Tabs tablet Commonly known as: OS-CAL Take 600 mg by mouth 3 (three) times daily with meals.   carbamazepine 100 MG 12 hr tablet Commonly known as: TEGRETOL XR Take 3 tablets (300 mg total) by mouth 2 (two) times daily. Reducing due to low sodium levels recently. What changed: additional instructions   cefTRIAXone  IVPB Commonly known as: ROCEPHIN Inject 2 g into the vein daily. Indication:  discitis First Dose: Yes Last Day of Therapy:  07/25/2020 Labs - Once weekly:  CBC/D and BMP, Labs - Every other week:  ESR and CRP Method of administration: IV Push Method of administration may be changed at the discretion of home infusion pharmacist based upon assessment of  the patient and/or caregiver's ability to self-administer the medication ordered.   CertaVite/Antioxidants Tabs Take 1 tablet by mouth daily. Advance , daily   cholestyramine 4 g packet Commonly known as: QUESTRAN Take 1 packet (4 g total) by mouth 3 (three) times daily.   enoxaparin 80 MG/0.8ML injection Commonly known as: LOVENOX Inject 0.8 mLs (80 mg total) into the skin every 12 (twelve) hours.   feeding supplement (PRO-STAT SUGAR FREE 64) Liqd Take 30 mLs by mouth 3 (three) times daily.   gabapentin 300 MG capsule Commonly known as: NEURONTIN Take 2 capsules (600 mg total) by mouth at bedtime. What changed:   how much to take  when to take this  additional instructions   levETIRAcetam 500 MG tablet Commonly known as: KEPPRA Take 1 tablet (500 mg total) by mouth 2 (two) times daily.   pantoprazole 40 MG tablet Commonly known as: PROTONIX Take 40 mg by mouth daily.   Pulmicort Flexhaler 180 MCG/ACT inhaler Generic drug: budesonide Inhale 1 puff into the lungs 2 (two) times daily as needed. What changed:   when to take this  reasons to take this   Selenium 200 MCG Tbcr Take 1 tablet by mouth  daily.   vancomycin  IVPB Inject 1,000 mg into the vein every 12 (twelve) hours. Indication:  discitis First Dose: Yes Last Day of Therapy:  07/25/2020 Labs - _0 /11/21 1025         Allergies  Allergen Reactions  . Seasonal Ic [Cholestatin]     Runny nose    Follow-up Information    Vicenta Aly, [name]. Call in 1 day(s).   Specialty: Nurse Practitioner Why: please call for a post hospital follow up appointment Contact information: [place] [address] [phone]        Thayer Headings, MD Follow up.   Specialty: Infectious Diseases Contact information: [address] [place] [address] [phone]        Wilford Corner, MD.  Call in 1 day(s).   Specialty: Gastroenterology Why: please call for a post hospital follow up appointment Contact information: [address] [place] [address] [phone]                The results of significant diagnostics from this hospitalization (including imaging, microbiology, ancillary and laboratory) are listed below for reference.    Significant Diagnostic Studies: DG Chest 2 View  Result Date: 06/07/2020 CLINICAL DATA:  Follow-up. EXAM: CHEST - 2 VIEW COMPARISON:  06/01/2019 FINDINGS: Lungs are adequately inflated demonstrate subtle hazy prominence of the central bronchovascular markings. Mild hazy density over the left retrocardiac region which may be due to layering effusion/atelectasis. Cardiomediastinal silhouette and remainder of the exam is unchanged. IMPRESSION: Minimal opacification in the left retrocardiac region which may be due to layering effusion/atelectasis. Mild stable prominence of the central bronchovascular markings which may be due to mild vascular congestion. Electronically Signed   By: Marin Olp M.D.   On: 06/07/2020 17:19   DG Abd 1 View  Result Date: 06/06/2020 CLINICAL DATA:  Nausea vomiting. EXAM: ABDOMEN - 1 VIEW COMPARISON:  05/31/2020 CT evaluation and prior plain film evaluation FINDINGS: Abundant stool is present in the ascending colon. Some suggestion of haustral thickening particularly in the cecum. Pattern of distension is similar to previous imaging. Spinal degenerative changes with similar appearance to prior studies greatest in the mid lumbar spine. IMPRESSION: Abundant stool in the ascending colon with suggestion of haustral thickening particularly in the cecum. Haustral thickening likely reflecting colitis in this patient with known colitis. No change in colonic distension compared to recent imaging. Electronically Signed   By:  Zetta Bills M.D.   On: 06/06/2020 13:24   MR Lumbar Spine W Wo Contrast  Result Date: 06/07/2020 CLINICAL DATA:  Low back pain, no red flags. EXAM: MRI LUMBAR SPINE WITHOUT AND WITH CONTRAST TECHNIQUE: Multiplanar and multiecho pulse sequences of the lumbar spine were obtained without and with intravenous contrast. CONTRAST:  8.64m GADAVIST GADOBUTROL 1 MMOL/ML IV SOLN COMPARISON:  CT abdomen/pelvis 06/07/2020, CT abdomen/pelvis 05/31/2020 FINDINGS: There is mild motion degradation of multiple sequences. Segmentation: 5 lumbar vertebrae are assumed. The caudal most well-formed intervertebral disc is designated L5-S1. Alignment: Mild L3-L4 right lateral listhesis was better appreciated on CT abdomen/pelvis performed earlier the same day. Vertebrae: Redemonstrated prominent endplate irregularity at L3-L4 with mild L3 and L4 vertebral body height loss. Vertebral body height is otherwise maintained. There is diffuse nonspecific heterogeneous marrow signal. Multilevel degenerative endplate irregularity and predominantly fatty degenerative endplate marrow signal. Conus medullaris and cauda equina: Conus extends to the L1-L2 level. No signal abnormality within the visualized distal spinal cord. No definite abnormal enhancement within the spinal canal. Paraspinal and other soft tissues: No abnormality identified within included portions of the abdomen/retroperitoneum. Redemonstrated nonspecific presacral edema. Disc levels: Multilevel disc degeneration. Most notably there is advanced L3-L4 disc degeneration and moderate disc degeneration at L4-L5 and L5-S1. T12-L1: Facet arthrosis. No significant disc herniation or stenosis. L1-L2: Minimal disc bulge. Mild facet arthrosis. No significant disc herniation or stenosis. L2-L3: Minimal disc bulge with endplate osteophytes. Moderate facet arthrosis. No significant spinal canal stenosis. Mild/moderate left neural foraminal narrowing. L3-L4: Advanced disc space narrowing with  fluid signal in the disc space. Prominent endplate irregularity. There is enhancement along the disc margins and endplates. Disc bulge with osteophyte ridge. Disc osteophyte ridge is most prominent within the central through left subarticular zones. Advanced facet arthrosis with ligamentum flavum hypertrophy. Severe central  and left subarticular stenosis. Moderate right with severe left neural foraminal narrowing. L4-L5: Disc bulge. Superimposed broad-based right center to right foraminal disc protrusion. Osteophyte ridge greatest along the right lateral aspect of the disc space. Advanced facet arthrosis (greater on the right) with ligamentum flavum hypertrophy. Severe right subarticular narrowing with potential to affect the descending right L5 nerve root. Moderate central canal stenosis. Severe right with moderate left neural foraminal narrowing. L5-S1: Disc bulge asymmetric to the right. Associated osteophyte ridge greatest along the right lateral aspect of the disc space. Moderate/advanced facet arthrosis with ligamentum flavum hypertrophy. Moderate right subarticular narrowing with crowding of the descending right S1 nerve root. Central canal patent. Bilateral neural foraminal narrowing (severe right, mild left). Impression #1 will be called to the ordering clinician or representative by the Radiologist Assistant, and communication documented in the PACS or [employer]. IMPRESSION: At L3-L4, there is advanced disc space narrowing with fluid signal in the disc space. Prominent endplate irregularity with mild L3 and L4 vertebral body height loss. There is enhancement along the disc margins and adjacent endplates. Findings are suspicious for possible discitis/osteomyelitis. Also at this level, there is a disc bulge. Disc osteophyte ridge which is most prominent within the central through left subarticular zones. Advanced facet arthrosis with ligamentum flavum hypertrophy. Severe central and left subarticular  stenosis. Bilateral neural foraminal narrowing (moderate right, severe left). Additional levels of lumbar spondylosis, most notably as follows. At L4-L5, there is multifactorial severe right subarticular narrowing with potential to affect the descending right L5 nerve root. Moderate central canal stenosis. Bilateral neural foraminal narrowing (severe right, moderate left). At L5-S1, there is multifactorial moderate right subarticular narrowing with crowding of the descending right S1 nerve root. Bilateral neural foraminal narrowing (severe right, mild left). Diffuse nonspecific heterogeneous marrow signal. Redemonstrated nonspecific mild presacral edema. Electronically Signed   By: Kellie Simmering DO   On: 06/07/2020 20:46   CT ABDOMEN PELVIS W CONTRAST  Result Date: 06/07/2020 CLINICAL DATA:  Abdominal distention. Nausea, vomiting. Bowel obstruction suspected. EXAM: CT ABDOMEN AND PELVIS WITH CONTRAST TECHNIQUE: Multidetector CT imaging of the abdomen and pelvis was performed using the standard protocol following bolus administration of intravenous contrast. CONTRAST:  135m OMNIPAQUE IOHEXOL 350 MG/ML SOLN COMPARISON:  Two-view abdomen on 06/07/2020, CT of the abdomen and pelvis on 05/31/2020 FINDINGS: Lower chest: Thrombus is identified within the RIGHT LOWER lobe pulmonary artery and branches. The pulmonary arteries are not fully evaluated on this study of the abdomen. There are small bilateral pleural effusions associated with bibasilar atelectasis. A 4 millimeter nodule in the RIGHT middle lobe is adjacent to the minor fissure and likely represents intrafissural lymph node, not previously imaged. Coronary artery calcifications and coronary stent. Heart size is normal. No pericardial effusion. Hepatobiliary: Cholecystectomy.  Liver is unremarkable. Pancreas: Unremarkable. No pancreatic ductal dilatation or surrounding inflammatory changes. Spleen: Normal in size without focal abnormality. Adrenals/Urinary  Tract: Adrenal glands are normal in appearance. A subcentimeter low-attenuation lesion within the midpole region of the LEFT kidney likely represents a cyst but is too small fully characterize. No hydronephrosis. The ureters are unremarkable. Stomach/Bowel: Stomach is normal in appearance. Small bowel loops are normal in caliber and wall thickness. Appendix is normal. There is colonic wall thickening, mucosal enhancement of the descending and sigmoid colon. The ascending and transverse colon have normal wall thickness. There is significant stool burden. Vascular/Lymphatic: There is minimal atherosclerotic calcification of the abdominal aorta. No aneurysm. Multiple small retroperitoneal lymph nodes appear stable. Reproductive: Hysterectomy.  Low-attenuation in the RIGHT adnexal region is 1.4 centimeters, stable in appearance. LEFT adnexal region is unremarkable. Other: There is persistent presacral edema. Musculoskeletal: Significant sclerosis and endplate irregularity at L3-4, similar to the previous exam. These changes are new since the PET scan performed in 2010 and although chronic, is difficult to entirely exclude discitis. There are significant degenerative changes throughout the lumbar spine with disc height loss and facet hypertrophy at multiple levels. Remote sacral fracture. No acute fracture. IMPRESSION: 1. 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 The pulmonary arteries are not fully evaluated on this study of the abdomen. 2. Small bilateral pleural effusions associated with bibasilar atelectasis. 3. Coronary artery disease. 4. Persistent changes of colitis involving the descending and sigmoid colon, slightly improved since prior study. Significant stool burden. 5. Persistent sclerosis and endplate irregularity at L3-4, similar to the previous exam. These changes are new since the PET scan performed in 2010 and although chronic, is difficult to entirely exclude discitis. Recommend  further evaluation with MRI of the lumbar spine with and without contrast. 6. Stable appearance of presacral edema, of uncertain significance. MRI of the lumbar spine may help characterize further. 7. Stable appearance of 1.4 centimeter RIGHT ovarian cyst. Recommend follow-up pelvic ultrasound in 6 months. 8. Remote sacral fracture. 9. Aortic Atherosclerosis (ICD10-I70.0). Critical Value/emergent results were called by telephone at the time of interpretation on 06/07/2020 at 6:18 pm to provider Hosie Poisson , who verbally acknowledged these results. Electronically Signed   By: Nolon Nations M.D.   On: 06/07/2020 18:19   CT ABDOMEN PELVIS W CONTRAST  Result Date: 05/31/2020 CLINICAL DATA:  Abdominal abscess/infection suspected Diarrhea. Patient with history of vesicle vaginal fistula closure. Recent bowel obstruction. EXAM: CT ABDOMEN AND PELVIS WITH CONTRAST TECHNIQUE: Multidetector CT imaging of the abdomen and pelvis was performed using the standard protocol following bolus administration of intravenous contrast. CONTRAST:  140m OMNIPAQUE IOHEXOL 300 MG/ML  SOLN COMPARISON:  Multiple recent radiographs. FINDINGS: Lower chest: Small bilateral pleural effusions with compressive atelectasis. Mild wall thickening of the distal esophagus. Small amount of contrast in the distal esophagus. Hepatobiliary: No focal liver abnormality is seen. No gallstones, gallbladder wall thickening, or biliary dilatation. Pancreas: No ductal dilatation or inflammation. Spleen: Normal in size without focal abnormality. Splenule at the hilum. Adrenals/Urinary Tract: Mild left adrenal thickening without dominant nodule. Normal right adrenal gland. No hydronephrosis or perinephric edema. Homogeneous renal enhancement with symmetric excretion on delayed phase imaging. There is some early excretion of IV contrast on initial exam. Subcentimeter low-density lesion in the upper left kidney is too small to characterize but likely cyst.  Urinary bladder is partially distended. There is excreted IV contrast in the dependent urinary bladder. Stomach/Bowel: There is colonic wall thickening with pericolonic stranding involving the distal descending through sigmoid colon. Small amount of liquid stool in the distal colon. There is a moderate volume of stool in the remainder of the colon. There is mild transverse colonic redundancy. The appendix is not definitively visualized. There is no small bowel dilatation or evidence of obstruction. Stomach is unremarkable. Vascular/Lymphatic: Aortic atherosclerosis. No aortic aneurysm. Patent portal vein. There multiple prominent retroperitoneal nodes are all subcentimeter short axis. Reproductive: Post hysterectomy. Left ovary appears in situ and quiescent. 15 mm fluid density structure in the right adnexa is likely an ovarian cyst. Other: Pericolonic edema and fat stranding from the descending through the sigmoid colon. Small amount of free fluid in both pericolic gutters, left anterior pelvis, presacral region.  There is presacral edema. 15 mm fluid density structure in the right pelvis is likely an ovarian cyst, however no prior exams are available to confirm chronicity, and there is mild adjacent stranding. No free intra-abdominal air. Musculoskeletal: L3-L4 endplate sclerosis with endplate irregularity and erosions. Slight lateral translation of L3 on L4. mild L4 superior endplate compression fracture. Anterior osteophytes that are fragmented at this level. There is severe canal stenosis at this level. Probable remote sacral fracture. Additional endplate spurring and degenerative disc disease with facet hypertrophy throughout the lumbar spine. IMPRESSION: 1. Colonic wall thickening with pericolonic edema and fat stranding involving the distal descending through sigmoid colon consistent with colitis, likely infectious or inflammatory. 2. Small amount of free fluid in both pericolic gutters, left anterior pelvis,  and presacral region, likely reactive. There is a 15 mm fluid density structure in the right adnexa, felt to represent a small ovarian cyst, however there is mild adjacent stranding in this area and no prior exams are available to confirm chronicity. Small abscess is difficult to exclude, but felt less likely. 3. Small bilateral pleural effusions with compressive atelectasis. 4. L3-L4 endplate sclerosis with endplate irregularity and erosions. No prior exams are available for comparison to establish chronicity. Given there multiple prominent retroperitoneal lymph nodes and presacral edema, consider further evaluation with lumbar spine MRI. 5. Mild wall thickening of the distal esophagus containing a small amount of enteric contrast, suggesting reflux. Aortic Atherosclerosis (ICD10-I70.0). Electronically Signed   By: Keith Rake M.D.   On: 05/31/2020 01:49   DG CHEST PORT 1 VIEW  Result Date: 05/31/2020 CLINICAL DATA:  Hypoxia EXAM: PORTABLE CHEST 1 VIEW COMPARISON:  None. FINDINGS: The heart size and mediastinal contours are within normal limits. Both lungs are clear. The visualized skeletal structures are unremarkable. IMPRESSION: No acute airspace disease. Electronically Signed   By: Ulyses Jarred M.D.   On: 05/31/2020 01:29   DG CHEST PORT 1 VIEW  Result Date: 05/19/2020 CLINICAL DATA:  Nasogastric tube placement. EXAM: PORTABLE CHEST 1 VIEW COMPARISON:  Same day. FINDINGS: The heart size and mediastinal contours are within normal limits. Stable bibasilar atelectasis or infiltrates are noted. Nasogastric tube tip is seen in the expected position of the proximal stomach. The visualized skeletal structures are unremarkable. IMPRESSION: Nasogastric tube tip seen in expected position of the proximal stomach. Electronically Signed   By: Marijo Conception M.D.   On: 05/19/2020 14:11   DG CHEST PORT 1 VIEW  Result Date: 05/19/2020 CLINICAL DATA:  Cough and shortness of breath EXAM: PORTABLE CHEST 1 VIEW  COMPARISON:  February 06, 2013 FINDINGS: There is a shallow degree of inspiration. There is ill-defined airspace opacity in the lung bases, concerning for a degree of pneumonia in the bases. Lungs elsewhere clear. Heart size and pulmonary vascular normal. No adenopathy. There is arthropathy in the left shoulder. IMPRESSION: Shallow degree of inspiration with ill-defined airspace opacity concerning for pneumonia in the bases. The degree of shallow inspiration raises concern for a degree of underlying restrictive disease. Lungs elsewhere clear.  Cardiac silhouette normal.  No adenopathy. Electronically Signed   By: Lowella Grip III M.D.   On: 05/19/2020 12:00   DG Abd 2 Views  Result Date: 06/07/2020 CLINICAL DATA:  Vomiting EXAM: ABDOMEN - 2 VIEW COMPARISON:  Prior abdominal radiograph from 06/06/2020 FINDINGS: Colonic distension with stool in the ascending colon. Relatively under distended distal colon. Increasing gastric distension. On the upright or semi upright radiograph there is more lucency over the liver  than expected, potentially related artifact though of uncertain significance. LEFT lower lobe airspace disease in the retrocardiac region. Marked spinal degenerative changes. IMPRESSION: 1. Increasing gastric distension. 2. Colonic distension with stool in the ascending colon. 3. Lucency over the liver on the upright or upright radiograph, potentially related artifact though cannot exclude pneumoperitoneum. CT may be helpful for further assessment 4. LEFT lower lobe airspace disease. These results were called by telephone at the time of interpretation on 06/07/2020 at 1:54 pm to provider Hosie Poisson , who verbally acknowledged these results. Electronically Signed   By: Zetta Bills M.D.   On: 06/07/2020 13:55   DG Abd Portable 1V  Result Date: 05/26/2020 CLINICAL DATA:  Evaluate stool burden EXAM: PORTABLE ABDOMEN - 1 VIEW COMPARISON:  05/21/2020 FINDINGS: Contrast material is noted within the colon  but to a lesser degree than that seen on the prior exam. Mild retained fecal material is noted. No obstructive changes are seen. Stomach is distended with air. IMPRESSION: Mild retained fecal material.  No obstructive changes are noted. Electronically Signed   By: Inez Catalina M.D.   On: 05/26/2020 15:26   DG Abd Portable 1V-Small Bowel Obstruction Protocol-initial, 8 hr delay  Result Date: 05/21/2020 CLINICAL DATA:  69 year old female small-bowel obstruction protocol, 8 hours post contrast. EXAM: PORTABLE ABDOMEN - 1 VIEW COMPARISON:  05/20/2020 and earlier. FINDINGS: AP view at 0232 hours. Stable enteric tube in the left upper quadrant. Administered oral contrast is predominantly in large bowel, from the cecum to the descending colon. Gas-filled bowel loop now projecting over the mid lower abdomen is probably redundant sigmoid. Visible small bowel loops appear within normal limits. IMPRESSION: Administered oral contrast is primarily throughout the large intestine. No convincing small bowel obstruction. Electronically Signed   By: Genevie Ann M.D.   On: 05/21/2020 04:38   DG Abd Portable 1V  Result Date: 05/20/2020 CLINICAL DATA:  Abdominal distension several days. EXAM: PORTABLE ABDOMEN - 1 VIEW COMPARISON:  05/19/2020 FINDINGS: Interval improvement and bowel gas pattern with no significantly dilated small bowel loops on the current exam. No free peritoneal air. Air is present throughout the colon. Mild fecal retention over the rectum. Nasogastric tube has tip over the stomach in the left upper quadrant. Remainder of the exam is unchanged. IMPRESSION: Interval improvement with no significant dilated small bowel loops on the current exam. Nasogastric tube with tip over the stomach in the left upper quadrant. Electronically Signed   By: Marin Olp M.D.   On: 05/20/2020 10:27   DG Abd Portable 1V  Result Date: 05/19/2020 CLINICAL DATA:  Diarrhea. EXAM: PORTABLE ABDOMEN - 1 VIEW COMPARISON:  None. FINDINGS:  Stomach is nondistended. There are multiple dilated loops of small bowel identified within the left abdomen measuring up to 3.1 cm. A large stool burden is noted involving the right colon. IMPRESSION: 1. Dilated loops of small bowel within the left abdomen compatible which may reflect partial small bowel obstruction or small-bowel ileus. 2. Large stool burden within the right colon. Electronically Signed   By: Kerby Moors M.D.   On: 05/19/2020 11:41   VAS [place] LOWER EXTREMITY VENOUS (DVT)  Result Date: 06/08/2020  Lower Venous DVTStudy Indications: Edema.  Limitations: Patient cooperation. Comparison Study: 06/01/20 Performing Technologist: Antonieta Pert RDMS, RVT  Examination Guidelines: A complete evaluation includes B-mode imaging, spectral Doppler, color Doppler, and power Doppler as needed of all accessible portions of each vessel. Bilateral testing is considered an integral part of a complete examination. Limited examinations  for reoccurring indications may be performed as noted. The reflux portion of the exam is performed with the patient in reverse Trendelenburg.  +---------+---------------+---------+-----------+-----------+------------------+ RIGHT    CompressibilityPhasicitySpontaneityProperties Thrombus Aging     +---------+---------------+---------+-----------+-----------+------------------+ CFV      Partial        Yes      Yes        brightly   Age Indeterminate                                              echogenic                     +---------+---------------+---------+-----------+-----------+------------------+ SFJ      Full                                                             +---------+---------------+---------+-----------+-----------+------------------+ FV Prox  Full                                                             +---------+---------------+---------+-----------+-----------+------------------+ FV Mid   Full                                                              +---------+---------------+---------+-----------+-----------+------------------+ FV DistalFull                                                             +---------+---------------+---------+-----------+-----------+------------------+ PFV      None                               softly                                                                    echogenic                     +---------+---------------+---------+-----------+-----------+------------------+ POP                     Yes      Yes                   unable to compress +---------+---------------+---------+-----------+-----------+------------------+ PTV      Full                                                             +---------+---------------+---------+-----------+-----------+------------------+  PERO     Full                                                             +---------+---------------+---------+-----------+-----------+------------------+ GSV      Full                                                             +---------+---------------+---------+-----------+-----------+------------------+ Non-occlusive, age indeterminate thrombus seen in the right distal CFV extending from the [facility].  +---------+---------------+---------+-----------+----------+------------------+ LEFT     CompressibilityPhasicitySpontaneityPropertiesThrombus Aging     +---------+---------------+---------+-----------+----------+------------------+ CFV      Full           Yes      Yes                                     +---------+---------------+---------+-----------+----------+------------------+ SFJ      Full                                                            +---------+---------------+---------+-----------+----------+------------------+ FV Prox  Full                                                             +---------+---------------+---------+-----------+----------+------------------+ FV Mid   Full                                                            +---------+---------------+---------+-----------+----------+------------------+ FV DistalFull                                                            +---------+---------------+---------+-----------+----------+------------------+ PFV      Full                                                            +---------+---------------+---------+-----------+----------+------------------+ POP                     Yes      Yes                  unable to compress +---------+---------------+---------+-----------+----------+------------------+ PTV  Full                                                            +---------+---------------+---------+-----------+----------+------------------+ PERO     Full                                                            +---------+---------------+---------+-----------+----------+------------------+ GSV      Full                                                            +---------+---------------+---------+-----------+----------+------------------+    Summary: RIGHT: - Findings consistent with age indeterminate deep vein thrombosis involving the right common femoral vein, and right proximal profunda vein. - No cystic structure found in the popliteal fossa.  LEFT: - There is no evidence of deep vein thrombosis in the lower extremity. However, portions of this examination were limited- see technologist comments above.  - No cystic structure found in the popliteal fossa.  *See table(s) above for measurements and observations. Electronically signed by Deitra Mayo MD on 06/08/2020 at 10:57:28 PM.    Final    VAS [place] LOWER EXTREMITY VENOUS (DVT)  Result Date: 06/01/2020  Lower Venous DVTStudy Indications: Edema.  Limitations: Patient could not tolerate compressions. Patient was  combative, threatening, non-compliant. Comparison Study: No prior studies. Performing Technologist: Darlin Coco  Examination Guidelines: A complete evaluation includes B-mode imaging, spectral Doppler, color Doppler, and power Doppler as needed of all accessible portions of each vessel. Bilateral testing is considered an integral part of a complete examination. Limited examinations for reoccurring indications may be performed as noted. The reflux portion of the exam is performed with the patient in reverse Trendelenburg.  +---------+---------------+---------+-----------+---------------+--------------+ RIGHT    CompressibilityPhasicitySpontaneityProperties     Thrombus Aging +---------+---------------+---------+-----------+---------------+--------------+ CFV      Full           Yes      Yes                                      +---------+---------------+---------+-----------+---------------+--------------+ SFJ      Full                                                             +---------+---------------+---------+-----------+---------------+--------------+ FV Prox                          Yes        Patent by color               +---------+---------------+---------+-----------+---------------+--------------+ FV Mid  Yes        Patent by color               +---------+---------------+---------+-----------+---------------+--------------+ FV Distal                        Yes        Patent by color               +---------+---------------+---------+-----------+---------------+--------------+ PFV                              Yes        Patent by color               +---------+---------------+---------+-----------+---------------+--------------+ PTV                              Yes        Patent by color               +---------+---------------+---------+-----------+---------------+--------------+ PERO                             Yes         Patent by color               +---------+---------------+---------+-----------+---------------+--------------+   +---------+---------------+---------+-----------+---------------+--------------+ LEFT     CompressibilityPhasicitySpontaneityProperties     Thrombus Aging +---------+---------------+---------+-----------+---------------+--------------+ CFV                              Yes        Patent by color               +---------+---------------+---------+-----------+---------------+--------------+ SFJ                              Yes        Patent by color               +---------+---------------+---------+-----------+---------------+--------------+ FV Prox                          Yes        Patent by color               +---------+---------------+---------+-----------+---------------+--------------+ FV Mid                           Yes        Patent by color               +---------+---------------+---------+-----------+---------------+--------------+ FV Distal                        Yes        Patent by color               +---------+---------------+---------+-----------+---------------+--------------+ PFV                                                        Not visualized +---------+---------------+---------+-----------+---------------+--------------+ POP  Not visualized +---------+---------------+---------+-----------+---------------+--------------+ PTV                              Yes        Patent by color               +---------+---------------+---------+-----------+---------------+--------------+ PERO                             Yes        Patent by color               +---------+---------------+---------+-----------+---------------+--------------+     Summary: RIGHT: - There is no evidence of deep vein thrombosis in the lower extremity. However, portions of this examination  were limited- see technologist comments above.  LEFT: - There is no evidence of deep vein thrombosis in the lower extremity. However, portions of this examination were limited- see technologist comments above.  *See table(s) above for measurements and observations. Electronically signed by Ruta Hinds MD on 06/01/2020 at 11:31:17 AM.    Final    [place] EKG SITE RITE  Result Date: 06/12/2020 If Site Rite image not attached, placement could not be confirmed due to current cardiac rhythm.  IR LUMBAR DISC ASPIRATION W/IMG GUIDE  Result Date: 06/14/2020 INDICATION: 69 year old female currently admitted with ischemic colitis. 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 She is currently on flagyl and ciprofloxacin. She comes to our service for a fluoroscopy guided disc aspiration. EXAM: FLUOROSCOPY GUIDED L3-4 DISC ASPIRATION MEDICATIONS: The patient is currently admitted to the hospital and receiving intravenous antibiotics. The antibiotics were administered within an appropriate time frame prior to the initiation of the procedure. ANESTHESIA/SEDATION: Fentanyl 50 mcg IV; Versed 1.5 mg IV Moderate Sedation Time:  23 The patient was continuously monitored during the procedure by the interventional radiology nurse under my direct supervision. COMPLICATIONS: None immediate. PROCEDURE: Informed written consent was obtained from the patient's healthcare proxy after a thorough discussion of the procedural risks, benefits and alternatives. All questions were addressed. Maximal Sterile Barrier Technique was utilized including caps, mask, sterile gowns, sterile gloves, sterile drape, hand hygiene and skin antiseptic. A timeout was performed prior to the initiation of the procedure. Patient was made to lie prone on the angiography table. The lumbar region was prepped and draped in the usual sterile fashion. Under fluoroscopy, the L3-4 disc space was identified. Approximately 4 cm to the  right of midline, an 18 gauge x 20 cm trocar was advanced into the disc space under fluoroscopy guidance. The meningioma was removed and a 22 gauge x 20 cm needle was advanced into the disc. Multiple fine needle aspiration samples were obtained. The trocar was subsequently removed. The skin was cleaned and and a sterile bandage was placed over the access site. The patient tolerated the procedure well without incident or complication. Samples collected were sent to the laboratory for microbiology analysis. IMPRESSION: Successful and uncomplicated fluoroscopy guided L3-4 disc aspiration. Memory Electronically Signed   By: Pedro Earls M.D.   On: 06/14/2020 09:01    Microbiology: Recent Results (from the past 240 hour(s))  Aerobic/Anaerobic Culture (surgical/deep wound)     Status: None (Preliminary result)   Collection Time: 06/11/20 10:42 AM   Specimen: Fine Needle Aspirate  Result Value Ref Range Status   Specimen Description NEEDLE ASPIRATE  Final   Special Requests INERVERTEBRAL DISC L3 L4 ASPIRATE  Final   Gram Stain NO WBC SEEN NO ORGANISMS SEEN   Final   Culture   Final    NO GROWTH 3 DAYS NO ANAEROBES ISOLATED; CULTURE IN PROGRESS FOR 5 DAYS Performed at [facility], [place] [name] [place], [place] [address]    Report Status PENDING  Incomplete  Culture, fungus without smear     Status: None (Preliminary result)   Collection Time: 06/11/20 10:42 AM   Specimen: Fine Needle Aspirate  Result Value Ref Range Status   Specimen Description NEEDLE ASPIRATE  Final   Special Requests INTERVERTEBRAL DISC L3 L4 ASPIRATE  Final   Culture   Final    NO FUNGUS ISOLATED AFTER 3 DAYS Performed at [facility], [address], [place], [place] [address]    Report Status PENDING  Incomplete  Fungus Stain     Status: None (Preliminary result)   Collection Time: 06/11/20 10:42 AM  Result Value Ref Range Status   FUNGUS STAIN Comment  Final    Comment:  (NOTE) KOH/Calcofluor preparation:  no fungus observed. Performed At: [facility] [place], [place] [id number] Rush Farmer MD OH:[id number]    Fungal Source PENDING  Incomplete     Labs: Basic Metabolic Panel: Recent Labs  Lab 06/10/20 0030 06/11/20 1113 06/12/20 1656 06/13/20 0209  NA 132* 134* 132* 133*  K 4.0 3.8 4.2 4.2  CL 99 102 103 105  CO2 25 25 21* 21*  GLUCOSE 124* 109* 107* 98  BUN _0 CREATININE 0.45 0.47 0.44 0.45  CALCIUM 8.6* 8.6* 8.4* 8.5*  MG 1.8  --   --   --   PHOS 2.7  --   --   --    Liver Function Tests: No results for input(s): AST, ALT, ALKPHOS, BILITOT, PROT, ALBUMIN in the last 168 hours. No results for input(s): LIPASE, AMYLASE in the last 168 hours. No results for input(s): AMMONIA in the last 168 hours. CBC: Recent Labs  Lab 06/08/20 0721 06/10/20 0030 06/11/20 1113 06/12/20 1656 06/13/20 0209  WBC 5.0 6.8 5.0 5.4 5.2  NEUTROABS  --  4.7 3.5 3.2 3.3  HGB 9.5* 8.5* 7.9* 10.0* 9.1*  HCT 29.7* 26.5* 24.9* 32.0* 29.1*  MCV 98.3 98.9 99.2 100.9* 101.4*  PLT 355 368 421* 399 375   Cardiac Enzymes: No results for input(s): CKTOTAL, CKMB, CKMBINDEX, TROPONINI in the last 168 hours. BNP: BNP (last 3 results) No results for input(s): BNP in the last 8760 hours.  ProBNP (last 3 results) No results for input(s): PROBNP in the last 8760 hours.  CBG: No results for input(s): GLUCAP in the last 168 hours.     Signed:  Kayleen Memos, MD [facility] 06/14/2020, 11:12 AM

## 2020-06-14 NOTE — TOC Benefit Eligibility Note (Signed)
Transition of Care [facility]) Benefit Eligibility Note    Patient Details  Name: Nicole [name] MRN: [id number] Date of Birth: 23-Jul-1951   Medication/Dose: Eliquis 5mg  BID  Covered?: Yes  Prescription Coverage [facility]  Spoke with Person/Company/Phone Number:: [facility] [employer]  Co-Pay: $0 for both mail order and retail  Prior Approval: No    [phone] Phone Number: 06/14/2020, 10:15 AM

## 2020-06-14 NOTE — TOC Progression Note (Addendum)
Transition of Care [facility]) - Progression Note    Patient Details  Name: Nicole [name] MRN: [id number] Date of Birth: Jan 12, 1951  Transition of Care [facility]) CM/SW Contact  Nadene Rubins Adria Devon, RN Phone Number: 06/14/2020, 11:47 AM  Clinical Narrative:     NCM handoff, was patient discharging today via PTAR. Chip Boer will provide a HHRN to go to group home tomorrow morning at 10 am to teach April at the group home how to administer IV ABX.    Spoke to Yettem, April ( at group home) and Okey Dupre ( patient's SW).  [name] and April stated they were unaware that patient would be discharged today and are not ready.   The residents at the group home go to a day center during the day. Staff would not be available to assist with IV ABX between 9 am and 3 pm.   Sybril also asked regarding a purewick , explained hospital does not arrange purewicks , they would have to follow up with home health and insurance ( see note from yesterday).   April at the group home is working on finding someone to be there during the day to assist with IVABX. April also willing to be taught IV ABX. However, April and [name] both have questions and would like to speak to a MD. NCM has asked Dr Margo Aye if she could speak to [place].   1240 Dr Margo Aye spoke to patient , [name] at bedside and April via speaker.   NCB spoke to patient, [name] at bedside and April via speaker . They confirmed Dr Margo Aye answered all their questions.    April and [name] requesting purwick ( see prior notes) and pads for on patient's hospital bed at the group home. NCM called [facility] [phone] spoke to [facility]. Per [facility] and pads would need to be purchased by family and then they could submit MD order to insurance.   Spoke to [place] with [facility]. They help patients/families order purewick and pads however patient would have to be an active patient with [facility], if patient changes HH agencies, once [facility] did the admission visit then [facility] would  order supplies.  Spoke to patient, sister and April at bedside and speaker phone. They want to stay with [facility] will order purewick ( has information) , and pads. [name] asking for a MD order for both to submit to insurance to see if insurance will reimburse. Entered order and secure chatted DR Margo Aye.Dr HAll signed order. NCM printed and placed in patient's room. Called [name] and she is aware.   Expected Discharge Plan: Home w Home Health Services Barriers to Discharge:  (Home health needs)  Expected Discharge Plan and Services Expected Discharge Plan: Home w Home Health Services In-house Referral: Clinical Social Work Discharge Planning Services: CM Consult Post Acute Care Choice: Resumption of Svcs/PTA Provider, Home Health Living arrangements for the past 2 months: Group Home Expected Discharge Date: 06/14/20                         HH Arranged: PT, OT, RN HH Agency: [facility], [facility] Date HH Agency Contacted: 06/13/20 Time HH Agency Contacted: 0900 Representative spoke with at [facility] Agency: Angela Adam, Jeri Modena   Social Determinants of Health (SDOH) Interventions    Readmission Risk Interventions Readmission Risk Prevention Plan 06/04/2020  Post Dischage Appt Complete  Medication Screening Complete  Transportation Screening Complete  Some recent data might be hidden

## 2020-06-14 NOTE — Progress Notes (Signed)
PT Cancellation Note  Patient Details Name: KALSEY LULL MRN: [id number] DOB: 05-29-1951   Cancelled Treatment:    Reason Eval/Treat Not Completed: (P) Other (comment) (Pt eating will return as time permits per POC.)   Kourtnei Rauber J Aundria Rud 06/14/2020, 10:17 AM  Bonney Leitz , PTA Acute Rehabilitation Services Pager [phone] Office [phone]

## 2020-06-14 NOTE — Progress Notes (Signed)
RT in to give pt scheduled BID aerosol tx. Upon entrance to room, pt stated please leave and don't bother me. No distress noted. Pt remains on RA. Aerosol tx not given at this time, RT will continue to monitor.

## 2020-06-14 NOTE — Progress Notes (Signed)
Palliative Care Follow-up Note Reason: Care Coordination  Spoke with patient's sister over the weekend re: discharge planning and care coordination. I also met with her sister and caregiver Nicole [name] on Friday afternoon to discuss best care environment for [place]. [name] is going to do best in her familiar environment with caregivers who she knows and trusts. Her group home is very motivated and willing to work at making sure Nicole [name] can be cared for there but she will need the right services to be successful. She is looking at 6 weeks of IV antibiotics- hopeful for improvement but know she is quite deconditioned and it is difficult to know at this point where she will land in terms of her functional status and QOL. Goal is to avoid re-hospitalization and SNF placement if possible.  Recommendations and Plans for home discharge:  IV antibiotics-per [facility] infusion and ID orders  Home Health RN for PICC, IV infusion, Wound Care  DME- hospital bed, inquiring about Purwick catheter  Symptomatic treatment for diarrhea and monitor for constipation- needs to be on a probiotic (Metagenics IB recommended).  Continue scheduled Tylenol and PRN NSAIDS  Home Health PT and OT-mobilization will be critical to her overall QOL- she has atrophy in her lower leg muscles and it is difficult to know if she will ever be able to walk using a walker- with treatment of the infection and pain control she will have the best chance of recovering some function and mobility.  Her Community Case Worker is working on request for [employer]- I am happy to support this request with a letter documenting her needs- will see if hospital CMRN can help expedite this service.  Please place an ambulatory referral for community based PC services to follow and assist with ongoing goals of care and symptom management.  Will continue to follow and support the care plan.  MOST form is on the chart- at discharge please make a copy  and give her sister the original document which should follow the patient and be available and easily accessible at the group home.   Nicole Hacker, DO Palliative Medicine [phone]  Time: 35  minutes Greater than 50%  of this time was spent counseling and coordinating care related to the above assessment and plan.

## 2020-06-15 MED ORDER — SACCHAROMYCES BOULARDII 250 MG PO CAPS: 250.0000 mg | ORAL_CAPSULE | Freq: Two times a day (BID) | ORAL | 0 refills | Status: AC

## 2020-06-15 NOTE — Care Management Important Message (Signed)
Important Message  Patient Details  Name: Nicole [name] MRN: [id number] Date of Birth: 12/08/1950   Medicare Important Message Given:  Yes     Dorena Bodo 06/15/2020, 1:11 PM

## 2020-06-15 NOTE — TOC Progression Note (Addendum)
Transition of Care [facility]) - Progression Note    Patient Details  Name: POETRY CERRO MRN: [id number] Date of Birth: February 12, 1951  Transition of Care [facility]) CM/SW Contact  Medora Roorda, Adria Devon, RN Phone Number: 06/15/2020, 12:38 PM  Clinical Narrative:     Spoke to [phone] she has ordered purewick and pads, however will not have them until tomorrow.   Spoke to April at Group Home [phone]. She is unable to accept until purewick arrives tomorrow.   Discussed discharge tomorrow and IV ABX teaching. If patient discharged tomorrow patient could receive her doses here at hospital and NCM would see if [facility] would be available for education June 17, 2020 THursday and give am does of antibiotic. April is unsure if she has the staff to accept patient tomorrow. NCM explained patient is medically ready to be discharged. April voiced understanding she will check schedule and see if she will have staff to accept patient tomorrow and call NCM back. Once she does NCM will call [facility] to confirm teaching for Thursday am and also call [facility].  1425 Called April back and left message. Awaiting call back.  Expected Discharge Plan: Home w Home Health Services Barriers to Discharge:  (Home health needs)  Expected Discharge Plan and Services Expected Discharge Plan: Home w Home Health Services In-house Referral: Clinical Social Work Discharge Planning Services: CM Consult Post Acute Care Choice: Resumption of Svcs/PTA Provider, Home Health Living arrangements for the past 2 months: Group Home Expected Discharge Date: 06/15/20                         HH Arranged: PT, OT, RN HH Agency: [facility], [facility] Date [facility] Agency Contacted: 06/13/20 Time HH Agency Contacted: 0900 Representative spoke with at [facility] Agency: Angela Adam, Jeri Modena   Social Determinants of Health (SDOH) Interventions    Readmission Risk Interventions Readmission Risk Prevention  Plan 06/04/2020  Post Dischage Appt Complete  Medication Screening Complete  Transportation Screening Complete  Some recent data might be hidden

## 2020-06-15 NOTE — Progress Notes (Signed)
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  She was recently admitted with a small bowel obstruction and diarrhea, discharged on 6/24.  She continued to have diarrhea after discharge, poor p.o. intake and was readmitted on 6/27 with shock, confusion.    OT comments  Patient continues to make minimal progress towards goals in skilled OT session. Patient's session encompassed co-treat with PT in order to address functional deficits and due to the fact that pt has been demonstrating decreased participation and poor activity tolerance in previous sessions. Pt agitated by therapists' presence in session often stating "Get out of my room, Im going to call the cops on you" or "Im going to tell my sister you were bothering me". Therapists were able to redirect pt to sitting EOB, however due to anxiety and frustration waxed and waned between mod-max A to maintain upright posture. Pt offered comb to groom, however threw comb, therefore pt was handed various bottles in different weights to aid in promotion of BUEs. Pt noted to fatigue quickly as pt was able to complete 15 times, but could throw items less than a foot with minimal force behind it. Pt attempted to "kick" therapist, however again due to weakness, was only able to extend foot to tap PTA's shoe. Therapists completed PROM/AAROM to [place] with pt in chair position at end of session and left pt in chair position (RN notified) with all needs met. Discharge remains appropriate; will continue to follow acutely.    Follow Up Recommendations  SNF;Supervision/Assistance - 24 hour    Equipment Recommendations  Other (comment);Wheelchair (measurements OT);Wheelchair cushion (measurements OT)    Recommendations for Other Services       Precautions / Restrictions Precautions Precautions: Fall Restrictions Weight Bearing Restrictions: No       Mobility Bed Mobility Overal bed mobility: Needs Assistance Bed Mobility: Supine to Sit;Sit to Supine     Supine to sit: Total assist;+2 for physical assistance Sit to supine: +2 for physical assistance;Total assist   General bed mobility comments: total A +2 to roll and to perform supine <> sit. Needing max encouragement to participate, often obstinate and not wanting to participate  Transfers                 General transfer comment: Deferred due to limited participation on EOB    Balance Overall balance assessment: Needs assistance Sitting-balance support: Feet supported;Bilateral upper extremity supported;Single extremity supported Sitting balance-Leahy Scale: Poor Sitting balance - Comments: Did not heed to cues to sitting posture, mod-max A to maintain upright posture, Postural control: Posterior lean                                 ADL either performed or assessed with clinical judgement   ADL Overall ADL's : Needs assistance/impaired     Grooming: Brushing hair;Wash/dry hands;Wash/dry face Grooming Details (indicate cue type and reason): Pt refusing to participate in self care, throwing objects instead, in order to prolong session and sitting EOB, pt given more items to throw to increase activity tolerance. Pt noted to fatigue quickly due to inactivity             Lower Body Dressing: Maximal assistance Lower Body Dressing Details (indicate cue type and reason): Unable  to hold self up at EOB to attempt             Functional mobility during ADLs: Maximal assistance General ADL Comments: session focused on sitting tolerance in preparation for OOB activity. Pt with increased frustation in session, often threatening therapist's or throwing things at therapist, pt unable to maintain balance EOB without max A to remain  upright.     Vision       Perception     Praxis      Cognition Arousal/Alertness: Awake/alert Behavior During Therapy: Agitated;Anxious Overall Cognitive Status: History of cognitive impairments - at baseline                                 General Comments: Pt agitated in session, often asking to lay back down, threatening to call the cops on therapists. Requried max encouragement to participate, and fatigues quickly when attmempting to kick (pt successfully tapped PTA's shoe 1x) or hit therapists with items (pt able to throw items with minimal force less than a foot)        Exercises General Exercises - Upper Extremity Shoulder Flexion: PROM;Both;5 reps;Seated Shoulder Extension: PROM;Both;5 reps;Seated Elbow Flexion: PROM;AAROM;Both;10 reps Elbow Extension: PROM;AAROM;Both;10 reps   Shoulder Instructions       General Comments      Pertinent Vitals/ Pain       Pain Assessment: Faces Faces Pain Scale: Hurts a little bit Pain Location: generalized Pain Descriptors / Indicators: Grimacing;Sharp;Moaning;Guarding Pain Intervention(s): Limited activity within patient's tolerance;Monitored during session;Repositioned  Home Living                                          Prior Functioning/Environment              Frequency  Min 2X/week        Progress Toward Goals  OT Goals(current goals can now be found in the care plan section)  Progress towards OT goals: Progressing toward goals  Acute Rehab OT Goals Patient Stated Goal: to call the cops on you OT Goal Formulation: Patient unable to participate in goal setting Time For Goal Achievement: 06/16/20 Potential to Achieve Goals: Fair  Plan Discharge plan remains appropriate    Co-evaluation    PT/OT/SLP Co-Evaluation/Treatment: Yes Reason for Co-Treatment: Complexity of the patient's impairments (multi-system involvement);Necessary to address cognition/behavior during  functional activity;For patient/therapist safety;To address functional/ADL transfers PT goals addressed during session: Mobility/safety with mobility;Strengthening/ROM OT goals addressed during session: ADL's and self-care;Strengthening/ROM      AM-PAC OT "6 Clicks" Daily Activity     Outcome Measure   Help from another person eating meals?: A Lot Help from another person taking care of personal grooming?: A Little Help from another person toileting, which includes using toliet, bedpan, or urinal?: Total Help from another person bathing (including washing, rinsing, drying)?: A Lot Help from another person to put on and taking off regular upper body clothing?: A Lot Help from another person to put on and taking off regular lower body clothing?: Total 6 Click Score: 11    End of Session    OT Visit Diagnosis: Muscle weakness (generalized) (M62.81);Other symptoms and signs involving the nervous system (R29.898);Other symptoms and signs involving cognitive function;Pain;Hemiplegia and hemiparesis Hemiplegia - Right/Left: Left Hemiplegia - dominant/non-dominant: Non-Dominant Hemiplegia - caused by: Unspecified Pain - Right/Left:  Right Pain - part of body:  (generalized)   Activity Tolerance Patient limited by fatigue;Treatment limited secondary to agitation   Patient Left in bed;with call bell/phone within reach;with chair alarm set;Other (comment) (Placed in chair position)   Nurse Communication Mobility status;Other (comment) (Chair position)        Time: 1343-1410 OT Time Calculation (min): 27 min  Charges: OT General Charges $OT Visit: 1 Visit OT Treatments $Self Care/Home Management : 8-22 mins  Corinne Ports E. Yuval Rubens, COTA/L Acute Rehabilitation Services [place] 06/15/2020, 2:52 PM

## 2020-06-15 NOTE — Progress Notes (Signed)
Per CM Wile, H. Group home is not ready to receive the patient.  Hopeful for discharge tomorrow.  Appreciate TOC's assistance with disposition.

## 2020-06-15 NOTE — Discharge Summary (Signed)
Discharge Summary  Nicole [name] BOF:[id number] DOB: January 10, 1951  PCP: Vicenta Aly, [name]  Admit date: 05/30/2020 Discharge date: 06/15/2020  Time spent: 35 minutes  Recommendations for Outpatient Follow-up:  1. Follow-up with infectious disease 2. Follow-up with GI 3. Follow-up with palliative care medicine. 4. Follow-up with your primary care provider 5. Take your medications as prescribed  Discharge Diagnoses:  Active Hospital Problems   Diagnosis Date Noted  . Sepsis ([place]) (SIRS criteria met T max 101.9, SBP < 100, HR 103, with colitis/cellulitis as source, evidence of end-organ dysfunction w/ SpO288% on admission) 05/31/2020  . 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 History of stroke with residual deficit (left hemiparesis) 06/09/2020  . Hypermagnesemia 06/09/2020  . Hypercalcemia 06/09/2020  . Hyperkalemia 06/09/2020  . Hyperglycemia 06/09/2020  . MRSA carrier 06/09/2020  . Hyponatremia 06/09/2020  . Hypokalemia 06/09/2020  . Anemia of chronic disease 06/09/2020  . Constipation in female 06/09/2020  . At risk for aspiration related to tube feeding 06/09/2020  . Pleural effusion, bilateral 06/09/2020  . Acute pulmonary embolism ([place]) 06/09/2020  . Coronary artery disease 06/09/2020  . Right ovarian cyst 06/09/2020  . Aortic atherosclerosis ([place]) 06/09/2020  . Spinal stenosis 06/09/2020  . Ischemic colitis ([place]) 06/09/2020  . Current non-adherence to medical treatment (refusing blood draws) while in hospital 06/09/2020  . Right leg DVT ([place]) 06/09/2020  . DNR (do not resuscitate) 06/09/2020  . Palliative care by specialist 06/09/2020  . Discitis 06/08/2020  . Pressure injury of skin: Left heel unstageable; Right heel stage I 06/07/2020  . Hypomagnesemia 05/31/2020  . Cellulitis 05/31/2020  . Ileus ([place]) 05/26/2020  . Altered mental status 05/17/2020  . Diarrhea   . Dehydration   . Seizures ([place])   . Hypertension   . Hyperlipidemia   . AKI  (acute kidney injury) [facility])     Resolved Hospital Problems   Diagnosis Date Noted Date Resolved  . Colitis 05/31/2020 06/09/2020    Discharge Condition: Stable  Diet recommendation: Soft diet with thin liquid  Vitals:   06/14/20 2028 06/15/20 0458  BP: (!) 109/47 (!) 107/58  Pulse: 91 94  Resp: 17 17  Temp: 98.3 F (36.8 C) 98.4 F (36.9 C)  SpO2: 100% 98%    History of present illness:  69 year old female with history of seizure disorder, stroke with left hemiparesis, hypertension, hyperlipidemia presents from group home with persistent diarrhea and generalized weakness. C. difficile PCR and GI pathogen negative. CT of the abdomen and pelvis shows Colonic wall thickening with pericolonic edema and fat stranding involving the distal descending through sigmoid colon consistent with colitis, likely infectious or inflammatory.  Flex sigmoidoscopy showing ischemic colitis. Rectal tube was removed. She was started on cholestyramine and low fiber diet.Repeat abdomen and pelvis on 06/07/2020 shows thrombus in the right lower lobe on the lung, no bowel perforation. She was started on full dose Lovenox.  Venous duplex of the lower extremities revealed age indeterminate DVT involving the right common femoral vein and right proximal profunda vein.  In view of multiple medical problems, pt refusal to take heparin shots for DVT prophylaxis, refusing blood draws, refusing to eat, deconditioning, after extensive discussion with HCPOA, palliative care was consulted to assist with establishing goals of care.  Was started on IV antibiotics Cipro and Flagyl on 7/7 by GI for ischemic colitis with suspected diverticulitis and possible colonic ulcer as an etiology.  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  ID consulted.  Aspiration of L3-L4 completed on 06/11/2020 by interventional  radiology.  Now on IV Vancomycin and rocephin empirically by ID.  Patient will need 6 weeks of IV antibiotics.  Orders signed for OPAT.  TOC assisting with disposition, likely discharge to group home on 06/14/2020.  06/15/20: Seen and examined at bedside.  No acute events overnight.  She has no new complaints.  She denies any chest pain, dyspnea, abdominal pain, or nausea.  Vital signs reviewed and are stable.  Medically cleared for discharge to ILF (sister's preference) to continue IV antibiotics.   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   History of stroke with residual deficit (left hemiparesis)   Hypermagnesemia   Hypercalcemia   Hyperkalemia   Hyperglycemia   MRSA carrier   Hyponatremia   Hypokalemia   Anemia of chronic disease   Constipation in female   At risk for aspiration related to tube feeding   Pleural effusion, bilateral   Acute pulmonary embolism ([place])   Coronary artery disease   Right ovarian cyst   Aortic atherosclerosis ([place])   Spinal stenosis   Ischemic colitis ([place])   Current non-adherence to medical treatment (refusing blood draws) while in hospital   Right leg DVT ([place])   DNR (do not resuscitate)   Palliative care by specialist  Diarrhea, improving, secondary to ischemic colitis, thought to be due to diverticulitis with possible colonic ulcer.  Seen by GI, post flex sigmoidoscopy/colonoscopy Path report from colonoscopy done on 06/04/2020 showed colonic mucosa and inflamed granulation  tissue consistent with ulcer.  The differential diagnosis includes segmental colitis associated with diverticulitis.  Clinical correlation is essential. No diverticulitis seen on CT scan or colonoscopy.   C. difficile PCR and GI panel negative Started on IV Cipro, IV Flagyl, cholestyramine, and gentle IV fluid hydration on 06/09/2020.  IV Flagyl and IV Cipro stopped on 06/12/2020 by ID. Continue cholestyramine 3 times daily as recommended by GI. Continue Florastor to 50 mg twice daily Follow-up with GI outpatient.  Improving acute blood loss likely from GI losses in the setting of chronic normocytic anemia Hemoglobin 9.1 No overt bleeding Sigmoidoscopy done on 06/04/2020: Altered vascular, congested, friable (with spontaneous bleeding), nodular, plaque covered, ulcerated and vascular-pattern-decreased mucosa in the sigmoid colon and in the descending colon.  Follow-up with GI  Resolved nausea/vomiting No abdominal pain on exam, bowel sounds present. Continue supportive care as needed Denies any nausea at the time of this visit. No use of antiemetics in the last 4 days  Acute pulmonary embolus in the right lower lobe of the lung,(incidental finding on the CT of the abdomen pelvis)/right lower extremity DVT Continue full dose subcu Lovenox twice daily. Was unable to use a DOAC agent due to interaction with Tegretol, decreasing the effect of the anticoagulation. Follow-up with your PCP  L3-4 discitis on CT scan, confirmed by MRI of lumbar spine 06/07/20 post intervertebral L3-4 disc aspirate  on 06/11/2020 by interventional radiology Independently viewed CT scan and MRI of lumbar spine showing evidence of L3-4 discitis. MRSA screen positive on 05/31/2020 Blood cultures drawn on 05/30/2020 negative final. Continue to follow cultures from L3-4 disc aspirate Per ID will need 6 weeks of IV antibiotics IV vancomycin and Rocephin PICC line ordered, OPAT orders signed. TOC assisting with home health  services at the group home Will need to follow-up with infectious disease outpatient. Discussed plan on 06/14/2020 with her sister who is also her guardian, and and with Alice via phone from the group home facility, all questions answered.  Seizure disorder Stable, no reported seizure activity Continue home Keppra and carbamazepine Follow-up with PCP or neurology  Hyperlipidemia Continue Lipitor  History of CVA Continue statin  GERD Continue PPI  Physical debility PT OT assessment recommended SNF Sister prefers return to group home with home health PT OT RN Home aide TOC assisting with disposition Continue PT OT with assistance and fall precautions   Resolved hypokalemia  Post repletion Serum potassium 4.0> 4.2.  Right ovarian cyst Diagnosed by CT on 06/07/2020 Recommend 6 months follow-up of pelvic ultrasound Follow-up with your PCP with possible referral with a gynecologist  Remote sacral fracture Seen on CT scan Follow-up with your PCP  Goals of care DNR Seen by palliative care team Follow-up with palliative care medicine for symptoms management and assistance with goals of care  Chronic pressure wounds, POA Deep tissue pressure injury of the heels, POA Pressure Injury 06/07/20 Heel Left Unstageable - Full thickness tissue loss in which the base of the injury is covered by slough (yellow, tan, gray, green or brown) and/or eschar (tan, brown or black) in the wound bed. (Active)  06/07/20 0800  Location: Heel  Location Orientation: Left  Staging: Unstageable - Full thickness tissue loss in which the base of the injury is covered by slough (yellow, tan, gray, green or brown) and/or eschar (tan, brown or black) in the wound bed.  Wound Description (Comments):   Present on Admission:     Pressure Injury 06/07/20 Heel Anterior;Right;Left Stage 1 - Intact skin with non-blanchable redness of a localized area usually over a bony prominence. (Active)  06/07/20 0800    Location: Heel  Location Orientation: Anterior;Right;Left  Staging: Stage 1 - Intact skin with non-blanchable redness of a localized area usually over a bony prominence.  Wound Description (Comments):   Present on Admission:  Wound care consulted and recommendations given.    Code Status:DNR     Procedures:  Intervertebral disc aspiration at L3-4 by interventional radiology  Consultations:  GI  Infectious disease  Interventional radiology  Discharge Exam: BP (!) 107/58 (BP Location: Left Arm)   Pulse 94   Temp 98.4 F (36.9 C)   Resp 17   Ht '5\' 7"'$  (1.702 m)   Wt 81.6 kg   SpO2 98%   BMI 28.18 kg/m  . General: 69 y.o. year-old female well-developed well-nourished in no acute distress.  Alert and interactive.   . Cardiovascular: Regular rate and rhythm no rubs or gallops.   Marland Kitchen Respiratory: Clear to auscultation no wheezes or rales. . Abdomen: Obese soft nontender normal bowel sounds present.   . Musculoskeletal: Trace lower extremity edema bilaterally.   Marland Kitchen Psychiatry: Mood is appropriate for condition and setting.  Discharge Instructions You were cared for by a hospitalist during your hospital stay. If you have any questions about your discharge medications or the care you received while you were in the hospital  after you are discharged, you can call the unit and asked to speak with the hospitalist on call if the hospitalist that took care of you is not available. Once you are discharged, your primary care physician will handle any further medical issues. Please note that NO REFILLS for any discharge medications will be authorized once you are discharged, as it is imperative that you return to your primary care physician (or establish a relationship with a primary care physician if you do not have one) for your aftercare needs so that they can reassess your need for medications and monitor your lab values.  Discharge Instructions    [facility]  pharmacist to adjust dose for Vancomycin, Aminoglycosides and other anti-infective therapies as requested by physician.   Complete by: As directed    [facility] to provide Cath Flo '2mg'$    Complete by: As directed    Administer for PICC line occlusion and as ordered by physician for other access device issues.   Anaphylaxis Kit: Provided to treat any anaphylactic reaction to the medication being provided to the patient if First Dose or when requested by physician   Complete by: As directed    Epinephrine '1mg'$ /ml vial / amp: Administer 0.'3mg'$  (0.80m) subcutaneously once for moderate to severe anaphylaxis, nurse to call physician and pharmacy when reaction occurs and call 911 if needed for immediate care   Diphenhydramine '50mg'$ /ml IV vial: Administer 25-'50mg'$  IV/IM PRN for first dose reaction, rash, itching, mild reaction, nurse to call physician and pharmacy when reaction occurs   Sodium Chloride 0.9% NS 5058mIV: Administer if needed for hypovolemic blood pressure drop or as ordered by physician after call to physician with anaphylactic reaction   Change dressing on IV access line weekly and PRN   Complete by: As directed    Flush IV access with Sodium Chloride 0.9% and Heparin 10 units/ml or 100 units/ml   Complete by: As directed    Home infusion instructions - [facility]   Complete by: As directed    Instructions: Flush IV access with Sodium Chloride 0.9% and Heparin 10units/ml or 100units/ml   Change dressing on IV access line: Weekly and PRN   Instructions Cath Flo '2mg'$ : Administer for PICC Line occlusion and as ordered by physician for other access device   [facility] pharmacist to adjust dose for: Vancomycin, Aminoglycosides and other anti-infective therapies as requested by physician   Method of administration may be changed at the discretion of home infusion pharmacist based upon assessment of the patient and/or caregiver's ability to self-administer the  medication ordered   Complete by: As directed      Allergies as of 06/15/2020      Reactions   Seasonal Ic [cholestatin]    Runny nose      Medication List    STOP taking these medications   diclofenac 75 MG EC tablet Commonly known as: VOLTAREN     TAKE these medications   atorvastatin 10 MG tablet Commonly known as: LIPITOR Take 10 mg by mouth every Monday, Wednesday, and Friday.   Benefiber Chew Chew 1 tablet by mouth daily.   busPIRone 15 MG tablet Commonly known as: BUSPAR Take 15 mg by mouth 2 (two) times daily.   calcium carbonate 600 MG Tabs tablet Commonly known as: OS-CAL Take 600 mg by mouth 3 (three) times daily with meals.   carbamazepine 100 MG 12 hr tablet Commonly known as: TEGRETOL XR Take 3 tablets (300 mg total) by  mouth 2 (two) times daily. Reducing due to low sodium levels recently. What changed: additional instructions   cefTRIAXone  IVPB Commonly known as: ROCEPHIN Inject 2 g into the vein daily. Indication:  discitis First Dose: Yes Last Day of Therapy:  07/25/2020 Labs - Once weekly:  CBC/D and BMP, Labs - Every other week:  ESR and CRP Method of administration: IV Push Method of administration may be changed at the discretion of home infusion pharmacist based upon assessment of the patient and/or caregiver's ability to self-administer the medication ordered.   CertaVite/Antioxidants Tabs Take 1 tablet by mouth daily. Advance , daily   cholestyramine 4 g packet Commonly known as: QUESTRAN Take 1 packet (4 g total) by mouth 3 (three) times daily.   enoxaparin 80 MG/0.8ML injection Commonly known as: LOVENOX Inject 0.8 mLs (80 mg total) into the skin every 12 (twelve) hours.   feeding supplement (PRO-STAT SUGAR FREE 64) Liqd Take 30 mLs by mouth 3 (three) times daily.   gabapentin 300 MG capsule Commonly known as: NEURONTIN Take 2 capsules (600 mg total) by mouth at bedtime. What changed:   how much to take  when to take  this  additional instructions   levETIRAcetam 500 MG tablet Commonly known as: KEPPRA Take 1 tablet (500 mg total) by mouth 2 (two) times daily.   pantoprazole 40 MG tablet Commonly known as: PROTONIX Take 40 mg by mouth daily.   Pulmicort Flexhaler 180 MCG/ACT inhaler Generic drug: budesonide Inhale 1 puff into the lungs 2 (two) times daily as needed. What changed:   when to take this  reasons to take this   saccharomyces boulardii 250 MG capsule Commonly known as: FLORASTOR Take 1 capsule (250 mg total) by mouth 2 (two) times daily.   Selenium 200 MCG Tbcr Take 1 tablet by mouth daily.   vancomycin  IVPB Inject 1,000 mg into the vein every 12 (twelve) hours. Indication:  discitis First Dose: Yes Last Day of Therapy:  07/25/2020 Labs - Sunday/Monday:  CBC/D, BMP, and vancomycin trough. Labs - Thursday:  BMP and vancomycin trough Labs - Every other week:  ESR and CRP Method of administration:Elastomeric Method of administration may be changed at the discretion of the patient and/or caregiver's ability to self-administer the medication ordered.   Vitamin D3 50 MCG (2000 UT) Tabs Take 2,000 Units by mouth daily.            Durable Medical Equipment  (From admission, onward)         Start     Ordered   06/14/20 1432  For home use only DME Other see comment  Once       Comments: Purewick for home  Disposable pads for hospital bed at home  Question:  Length of Need  Answer:  Lifetime   06/14/20 1432   06/09/20 1808  For home use only DME standard manual wheelchair with seat cushion  Once       Comments: Patient suffers from ambulatory dysfunction which impairs their ability to perform daily activities like walking in the home.  A walker will not resolve issue with performing activities of daily living. A wheelchair will allow patient to safely perform daily activities. Patient can safely propel the wheelchair in the home or has a caregiver who can provide  assistance. Length of need 6 months. Accessories: elevating leg rests, wheel locks, extensions and anti-tippers.   06/09/20 1808           Discharge Care Instructions  (  From admission, onward)         Start     Ordered   06/13/20 0000  Change dressing on IV access line weekly and PRN  (Home infusion instructions - [facility] )        06/13/20 1025         Allergies  Allergen Reactions  . Seasonal Ic [Cholestatin]     Runny nose    Follow-up Information    Vicenta Aly, [name]. Call in 1 day(s).   Specialty: Nurse Practitioner Why: please call for a post hospital follow up appointment Contact information: [place] [address] [phone]        Thayer Headings, MD Follow up.   Specialty: Infectious Diseases Contact information: [address] [address] [phone]        Wilford Corner, MD. Call in 1 day(s).   Specialty: Gastroenterology Why: please call for a post hospital follow up appointment Contact information: [address] [place] [address] [phone]                The results of significant diagnostics from this hospitalization (including imaging, microbiology, ancillary and laboratory) are listed below for reference.    Significant Diagnostic Studies: DG Chest 2 View  Result Date: 06/07/2020 CLINICAL DATA:  Follow-up. EXAM: CHEST - 2 VIEW COMPARISON:  06/01/2019 FINDINGS: Lungs are adequately inflated demonstrate subtle hazy prominence of the central bronchovascular markings. Mild hazy density over the left retrocardiac region which may be due to layering effusion/atelectasis. Cardiomediastinal silhouette and remainder of the exam is unchanged. IMPRESSION: Minimal opacification in the left retrocardiac region which may be due to layering effusion/atelectasis. Mild stable prominence of the central bronchovascular markings which may be due to mild  vascular congestion. Electronically Signed   By: Marin Olp M.D.   On: 06/07/2020 17:19   DG Abd 1 View  Result Date: 06/06/2020 CLINICAL DATA:  Nausea vomiting. EXAM: ABDOMEN - 1 VIEW COMPARISON:  05/31/2020 CT evaluation and prior plain film evaluation FINDINGS: Abundant stool is present in the ascending colon. Some suggestion of haustral thickening particularly in the cecum. Pattern of distension is similar to previous imaging. Spinal degenerative changes with similar appearance to prior studies greatest in the mid lumbar spine. IMPRESSION: Abundant stool in the ascending colon with suggestion of haustral thickening particularly in the cecum. Haustral thickening likely reflecting colitis in this patient with known colitis. No change in colonic distension compared to recent imaging. Electronically Signed   By: Zetta Bills M.D.   On: 06/06/2020 13:24   MR Lumbar Spine W Wo Contrast  Result Date: 06/07/2020 CLINICAL DATA:  Low back pain, no red flags. EXAM: MRI LUMBAR SPINE WITHOUT AND WITH CONTRAST TECHNIQUE: Multiplanar and multiecho pulse sequences of the lumbar spine were obtained without and with intravenous contrast. CONTRAST:  8.53m GADAVIST GADOBUTROL 1 MMOL/ML IV SOLN COMPARISON:  CT abdomen/pelvis 06/07/2020, CT abdomen/pelvis 05/31/2020 FINDINGS: There is mild motion degradation of multiple sequences. Segmentation: 5 lumbar vertebrae are assumed. The caudal most well-formed intervertebral disc is designated L5-S1. Alignment: Mild L3-L4 right lateral listhesis was better appreciated on CT abdomen/pelvis performed earlier the same day. Vertebrae: Redemonstrated prominent endplate irregularity at L3-L4 with mild L3 and L4 vertebral body height loss. Vertebral body height is otherwise maintained. There is diffuse nonspecific heterogeneous marrow signal. Multilevel degenerative endplate irregularity and predominantly fatty degenerative endplate marrow signal. Conus medullaris and cauda equina:  Conus extends  to the L1-L2 level. No signal abnormality within the visualized distal spinal cord. No definite abnormal enhancement within the spinal canal. Paraspinal and other soft tissues: No abnormality identified within included portions of the abdomen/retroperitoneum. Redemonstrated nonspecific presacral edema. Disc levels: Multilevel disc degeneration. Most notably there is advanced L3-L4 disc degeneration and moderate disc degeneration at L4-L5 and L5-S1. T12-L1: Facet arthrosis. No significant disc herniation or stenosis. L1-L2: Minimal disc bulge. Mild facet arthrosis. No significant disc herniation or stenosis. L2-L3: Minimal disc bulge with endplate osteophytes. Moderate facet arthrosis. No significant spinal canal stenosis. Mild/moderate left neural foraminal narrowing. L3-L4: Advanced disc space narrowing with fluid signal in the disc space. Prominent endplate irregularity. There is enhancement along the disc margins and endplates. Disc bulge with osteophyte ridge. Disc osteophyte ridge is most prominent within the central through left subarticular zones. Advanced facet arthrosis with ligamentum flavum hypertrophy. Severe central and left subarticular stenosis. Moderate right with severe left neural foraminal narrowing. L4-L5: Disc bulge. Superimposed broad-based right center to right foraminal disc protrusion. Osteophyte ridge greatest along the right lateral aspect of the disc space. Advanced facet arthrosis (greater on the right) with ligamentum flavum hypertrophy. Severe right subarticular narrowing with potential to affect the descending right L5 nerve root. Moderate central canal stenosis. Severe right with moderate left neural foraminal narrowing. L5-S1: Disc bulge asymmetric to the right. Associated osteophyte ridge greatest along the right lateral aspect of the disc space. Moderate/advanced facet arthrosis with ligamentum flavum hypertrophy. Moderate right subarticular narrowing with crowding  of the descending right S1 nerve root. Central canal patent. Bilateral neural foraminal narrowing (severe right, mild left). Impression #1 will be called to the ordering clinician or representative by the Radiologist Assistant, and communication documented in the PACS or [employer]. IMPRESSION: At L3-L4, there is advanced disc space narrowing with fluid signal in the disc space. Prominent endplate irregularity with mild L3 and L4 vertebral body height loss. There is enhancement along the disc margins and adjacent endplates. Findings are suspicious for possible discitis/osteomyelitis. Also at this level, there is a disc bulge. Disc osteophyte ridge which is most prominent within the central through left subarticular zones. Advanced facet arthrosis with ligamentum flavum hypertrophy. Severe central and left subarticular stenosis. Bilateral neural foraminal narrowing (moderate right, severe left). Additional levels of lumbar spondylosis, most notably as follows. At L4-L5, there is multifactorial severe right subarticular narrowing with potential to affect the descending right L5 nerve root. Moderate central canal stenosis. Bilateral neural foraminal narrowing (severe right, moderate left). At L5-S1, there is multifactorial moderate right subarticular narrowing with crowding of the descending right S1 nerve root. Bilateral neural foraminal narrowing (severe right, mild left). Diffuse nonspecific heterogeneous marrow signal. Redemonstrated nonspecific mild presacral edema. Electronically Signed   By: Kellie Simmering DO   On: 06/07/2020 20:46   CT ABDOMEN PELVIS W CONTRAST  Result Date: 06/07/2020 CLINICAL DATA:  Abdominal distention. Nausea, vomiting. Bowel obstruction suspected. EXAM: CT ABDOMEN AND PELVIS WITH CONTRAST TECHNIQUE: Multidetector CT imaging of the abdomen and pelvis was performed using the standard protocol following bolus administration of intravenous contrast. CONTRAST:  160m OMNIPAQUE IOHEXOL  350 MG/ML SOLN COMPARISON:  Two-view abdomen on 06/07/2020, CT of the abdomen and pelvis on 05/31/2020 FINDINGS: Lower chest: Thrombus is identified within the RIGHT LOWER lobe pulmonary artery and branches. The pulmonary arteries are not fully evaluated on this study of the abdomen. There are small bilateral pleural effusions associated with bibasilar atelectasis. A 4 millimeter nodule in the RIGHT middle lobe is  adjacent to the minor fissure and likely represents intrafissural lymph node, not previously imaged. Coronary artery calcifications and coronary stent. Heart size is normal. No pericardial effusion. Hepatobiliary: Cholecystectomy.  Liver is unremarkable. Pancreas: Unremarkable. No pancreatic ductal dilatation or surrounding inflammatory changes. Spleen: Normal in size without focal abnormality. Adrenals/Urinary Tract: Adrenal glands are normal in appearance. A subcentimeter low-attenuation lesion within the midpole region of the LEFT kidney likely represents a cyst but is too small fully characterize. No hydronephrosis. The ureters are unremarkable. Stomach/Bowel: Stomach is normal in appearance. Small bowel loops are normal in caliber and wall thickness. Appendix is normal. There is colonic wall thickening, mucosal enhancement of the descending and sigmoid colon. The ascending and transverse colon have normal wall thickness. There is significant stool burden. Vascular/Lymphatic: There is minimal atherosclerotic calcification of the abdominal aorta. No aneurysm. Multiple small retroperitoneal lymph nodes appear stable. Reproductive: Hysterectomy. Low-attenuation in the RIGHT adnexal region is 1.4 centimeters, stable in appearance. LEFT adnexal region is unremarkable. Other: There is persistent presacral edema. Musculoskeletal: Significant sclerosis and endplate irregularity at L3-4, similar to the previous exam. These changes are new since the PET scan performed in 2010 and although chronic, is difficult  to entirely exclude discitis. There are significant degenerative changes throughout the lumbar spine with disc height loss and facet hypertrophy at multiple levels. Remote sacral fracture. No acute fracture. IMPRESSION: 1. 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 The pulmonary arteries are not fully evaluated on this study of the abdomen. 2. Small bilateral pleural effusions associated with bibasilar atelectasis. 3. Coronary artery disease. 4. Persistent changes of colitis involving the descending and sigmoid colon, slightly improved since prior study. Significant stool burden. 5. Persistent sclerosis and endplate irregularity at L3-4, similar to the previous exam. These changes are new since the PET scan performed in 2010 and although chronic, is difficult to entirely exclude discitis. Recommend further evaluation with MRI of the lumbar spine with and without contrast. 6. Stable appearance of presacral edema, of uncertain significance. MRI of the lumbar spine may help characterize further. 7. Stable appearance of 1.4 centimeter RIGHT ovarian cyst. Recommend follow-up pelvic ultrasound in 6 months. 8. Remote sacral fracture. 9. Aortic Atherosclerosis (ICD10-I70.0). Critical Value/emergent results were called by telephone at the time of interpretation on 06/07/2020 at 6:18 pm to provider Hosie Poisson , who verbally acknowledged these results. Electronically Signed   By: Nolon Nations M.D.   On: 06/07/2020 18:19   CT ABDOMEN PELVIS W CONTRAST  Result Date: 05/31/2020 CLINICAL DATA:  Abdominal abscess/infection suspected Diarrhea. Patient with history of vesicle vaginal fistula closure. Recent bowel obstruction. EXAM: CT ABDOMEN AND PELVIS WITH CONTRAST TECHNIQUE: Multidetector CT imaging of the abdomen and pelvis was performed using the standard protocol following bolus administration of intravenous contrast. CONTRAST:  161m OMNIPAQUE IOHEXOL 300 MG/ML  SOLN COMPARISON:  Multiple  recent radiographs. FINDINGS: Lower chest: Small bilateral pleural effusions with compressive atelectasis. Mild wall thickening of the distal esophagus. Small amount of contrast in the distal esophagus. Hepatobiliary: No focal liver abnormality is seen. No gallstones, gallbladder wall thickening, or biliary dilatation. Pancreas: No ductal dilatation or inflammation. Spleen: Normal in size without focal abnormality. Splenule at the hilum. Adrenals/Urinary Tract: Mild left adrenal thickening without dominant nodule. Normal right adrenal gland. No hydronephrosis or perinephric edema. Homogeneous renal enhancement with symmetric excretion on delayed phase imaging. There is some early excretion of IV contrast on initial exam. Subcentimeter low-density lesion in the upper left kidney is too small  to characterize but likely cyst. Urinary bladder is partially distended. There is excreted IV contrast in the dependent urinary bladder. Stomach/Bowel: There is colonic wall thickening with pericolonic stranding involving the distal descending through sigmoid colon. Small amount of liquid stool in the distal colon. There is a moderate volume of stool in the remainder of the colon. There is mild transverse colonic redundancy. The appendix is not definitively visualized. There is no small bowel dilatation or evidence of obstruction. Stomach is unremarkable. Vascular/Lymphatic: Aortic atherosclerosis. No aortic aneurysm. Patent portal vein. There multiple prominent retroperitoneal nodes are all subcentimeter short axis. Reproductive: Post hysterectomy. Left ovary appears in situ and quiescent. 15 mm fluid density structure in the right adnexa is likely an ovarian cyst. Other: Pericolonic edema and fat stranding from the descending through the sigmoid colon. Small amount of free fluid in both pericolic gutters, left anterior pelvis, presacral region. There is presacral edema. 15 mm fluid density structure in the right pelvis is  likely an ovarian cyst, however no prior exams are available to confirm chronicity, and there is mild adjacent stranding. No free intra-abdominal air. Musculoskeletal: L3-L4 endplate sclerosis with endplate irregularity and erosions. Slight lateral translation of L3 on L4. mild L4 superior endplate compression fracture. Anterior osteophytes that are fragmented at this level. There is severe canal stenosis at this level. Probable remote sacral fracture. Additional endplate spurring and degenerative disc disease with facet hypertrophy throughout the lumbar spine. IMPRESSION: 1. Colonic wall thickening with pericolonic edema and fat stranding involving the distal descending through sigmoid colon consistent with colitis, likely infectious or inflammatory. 2. Small amount of free fluid in both pericolic gutters, left anterior pelvis, and presacral region, likely reactive. There is a 15 mm fluid density structure in the right adnexa, felt to represent a small ovarian cyst, however there is mild adjacent stranding in this area and no prior exams are available to confirm chronicity. Small abscess is difficult to exclude, but felt less likely. 3. Small bilateral pleural effusions with compressive atelectasis. 4. L3-L4 endplate sclerosis with endplate irregularity and erosions. No prior exams are available for comparison to establish chronicity. Given there multiple prominent retroperitoneal lymph nodes and presacral edema, consider further evaluation with lumbar spine MRI. 5. Mild wall thickening of the distal esophagus containing a small amount of enteric contrast, suggesting reflux. Aortic Atherosclerosis (ICD10-I70.0). Electronically Signed   By: Keith Rake M.D.   On: 05/31/2020 01:49   DG CHEST PORT 1 VIEW  Result Date: 05/31/2020 CLINICAL DATA:  Hypoxia EXAM: PORTABLE CHEST 1 VIEW COMPARISON:  None. FINDINGS: The heart size and mediastinal contours are within normal limits. Both lungs are clear. The visualized  skeletal structures are unremarkable. IMPRESSION: No acute airspace disease. Electronically Signed   By: Ulyses Jarred M.D.   On: 05/31/2020 01:29   DG CHEST PORT 1 VIEW  Result Date: 05/19/2020 CLINICAL DATA:  Nasogastric tube placement. EXAM: PORTABLE CHEST 1 VIEW COMPARISON:  Same day. FINDINGS: The heart size and mediastinal contours are within normal limits. Stable bibasilar atelectasis or infiltrates are noted. Nasogastric tube tip is seen in the expected position of the proximal stomach. The visualized skeletal structures are unremarkable. IMPRESSION: Nasogastric tube tip seen in expected position of the proximal stomach. Electronically Signed   By: Marijo Conception M.D.   On: 05/19/2020 14:11   DG CHEST PORT 1 VIEW  Result Date: 05/19/2020 CLINICAL DATA:  Cough and shortness of breath EXAM: PORTABLE CHEST 1 VIEW COMPARISON:  February 06, 2013 FINDINGS: There  is a shallow degree of inspiration. There is ill-defined airspace opacity in the lung bases, concerning for a degree of pneumonia in the bases. Lungs elsewhere clear. Heart size and pulmonary vascular normal. No adenopathy. There is arthropathy in the left shoulder. IMPRESSION: Shallow degree of inspiration with ill-defined airspace opacity concerning for pneumonia in the bases. The degree of shallow inspiration raises concern for a degree of underlying restrictive disease. Lungs elsewhere clear.  Cardiac silhouette normal.  No adenopathy. Electronically Signed   By: Lowella Grip III M.D.   On: 05/19/2020 12:00   DG Abd 2 Views  Result Date: 06/07/2020 CLINICAL DATA:  Vomiting EXAM: ABDOMEN - 2 VIEW COMPARISON:  Prior abdominal radiograph from 06/06/2020 FINDINGS: Colonic distension with stool in the ascending colon. Relatively under distended distal colon. Increasing gastric distension. On the upright or semi upright radiograph there is more lucency over the liver than expected, potentially related artifact though of uncertain significance.  LEFT lower lobe airspace disease in the retrocardiac region. Marked spinal degenerative changes. IMPRESSION: 1. Increasing gastric distension. 2. Colonic distension with stool in the ascending colon. 3. Lucency over the liver on the upright or upright radiograph, potentially related artifact though cannot exclude pneumoperitoneum. CT may be helpful for further assessment 4. LEFT lower lobe airspace disease. These results were called by telephone at the time of interpretation on 06/07/2020 at 1:54 pm to provider Hosie Poisson , who verbally acknowledged these results. Electronically Signed   By: Zetta Bills M.D.   On: 06/07/2020 13:55   DG Abd Portable 1V  Result Date: 05/26/2020 CLINICAL DATA:  Evaluate stool burden EXAM: PORTABLE ABDOMEN - 1 VIEW COMPARISON:  05/21/2020 FINDINGS: Contrast material is noted within the colon but to a lesser degree than that seen on the prior exam. Mild retained fecal material is noted. No obstructive changes are seen. Stomach is distended with air. IMPRESSION: Mild retained fecal material.  No obstructive changes are noted. Electronically Signed   By: Inez Catalina M.D.   On: 05/26/2020 15:26   DG Abd Portable 1V-Small Bowel Obstruction Protocol-initial, 8 hr delay  Result Date: 05/21/2020 CLINICAL DATA:  69 year old female small-bowel obstruction protocol, 8 hours post contrast. EXAM: PORTABLE ABDOMEN - 1 VIEW COMPARISON:  05/20/2020 and earlier. FINDINGS: AP view at 0232 hours. Stable enteric tube in the left upper quadrant. Administered oral contrast is predominantly in large bowel, from the cecum to the descending colon. Gas-filled bowel loop now projecting over the mid lower abdomen is probably redundant sigmoid. Visible small bowel loops appear within normal limits. IMPRESSION: Administered oral contrast is primarily throughout the large intestine. No convincing small bowel obstruction. Electronically Signed   By: Genevie Ann M.D.   On: 05/21/2020 04:38   DG Abd Portable  1V  Result Date: 05/20/2020 CLINICAL DATA:  Abdominal distension several days. EXAM: PORTABLE ABDOMEN - 1 VIEW COMPARISON:  05/19/2020 FINDINGS: Interval improvement and bowel gas pattern with no significantly dilated small bowel loops on the current exam. No free peritoneal air. Air is present throughout the colon. Mild fecal retention over the rectum. Nasogastric tube has tip over the stomach in the left upper quadrant. Remainder of the exam is unchanged. IMPRESSION: Interval improvement with no significant dilated small bowel loops on the current exam. Nasogastric tube with tip over the stomach in the left upper quadrant. Electronically Signed   By: Marin Olp M.D.   On: 05/20/2020 10:27   DG Abd Portable 1V  Result Date: 05/19/2020 CLINICAL DATA:  Diarrhea. EXAM:  PORTABLE ABDOMEN - 1 VIEW COMPARISON:  None. FINDINGS: Stomach is nondistended. There are multiple dilated loops of small bowel identified within the left abdomen measuring up to 3.1 cm. A large stool burden is noted involving the right colon. IMPRESSION: 1. Dilated loops of small bowel within the left abdomen compatible which may reflect partial small bowel obstruction or small-bowel ileus. 2. Large stool burden within the right colon. Electronically Signed   By: Kerby Moors M.D.   On: 05/19/2020 11:41   VAS [place] LOWER EXTREMITY VENOUS (DVT)  Result Date: 06/08/2020  Lower Venous DVTStudy Indications: Edema.  Limitations: Patient cooperation. Comparison Study: 06/01/20 Performing Technologist: Antonieta Pert RDMS, RVT  Examination Guidelines: A complete evaluation includes B-mode imaging, spectral Doppler, color Doppler, and power Doppler as needed of all accessible portions of each vessel. Bilateral testing is considered an integral part of a complete examination. Limited examinations for reoccurring indications may be performed as noted. The reflux portion of the exam is performed with the patient in reverse Trendelenburg.   +---------+---------------+---------+-----------+-----------+------------------+ RIGHT    CompressibilityPhasicitySpontaneityProperties Thrombus Aging     +---------+---------------+---------+-----------+-----------+------------------+ CFV      Partial        Yes      Yes        brightly   Age Indeterminate                                              echogenic                     +---------+---------------+---------+-----------+-----------+------------------+ SFJ      Full                                                             +---------+---------------+---------+-----------+-----------+------------------+ FV Prox  Full                                                             +---------+---------------+---------+-----------+-----------+------------------+ FV Mid   Full                                                             +---------+---------------+---------+-----------+-----------+------------------+ FV DistalFull                                                             +---------+---------------+---------+-----------+-----------+------------------+ PFV      None                               softly  echogenic                     +---------+---------------+---------+-----------+-----------+------------------+ POP                     Yes      Yes                   unable to compress +---------+---------------+---------+-----------+-----------+------------------+ PTV      Full                                                             +---------+---------------+---------+-----------+-----------+------------------+ PERO     Full                                                             +---------+---------------+---------+-----------+-----------+------------------+ GSV      Full                                                              +---------+---------------+---------+-----------+-----------+------------------+ Non-occlusive, age indeterminate thrombus seen in the right distal CFV extending from the [facility].  +---------+---------------+---------+-----------+----------+------------------+ LEFT     CompressibilityPhasicitySpontaneityPropertiesThrombus Aging     +---------+---------------+---------+-----------+----------+------------------+ CFV      Full           Yes      Yes                                     +---------+---------------+---------+-----------+----------+------------------+ SFJ      Full                                                            +---------+---------------+---------+-----------+----------+------------------+ FV Prox  Full                                                            +---------+---------------+---------+-----------+----------+------------------+ FV Mid   Full                                                            +---------+---------------+---------+-----------+----------+------------------+ FV DistalFull                                                            +---------+---------------+---------+-----------+----------+------------------+  PFV      Full                                                            +---------+---------------+---------+-----------+----------+------------------+ POP                     Yes      Yes                  unable to compress +---------+---------------+---------+-----------+----------+------------------+ PTV      Full                                                            +---------+---------------+---------+-----------+----------+------------------+ PERO     Full                                                            +---------+---------------+---------+-----------+----------+------------------+ GSV      Full                                                             +---------+---------------+---------+-----------+----------+------------------+    Summary: RIGHT: - Findings consistent with age indeterminate deep vein thrombosis involving the right common femoral vein, and right proximal profunda vein. - No cystic structure found in the popliteal fossa.  LEFT: - There is no evidence of deep vein thrombosis in the lower extremity. However, portions of this examination were limited- see technologist comments above.  - No cystic structure found in the popliteal fossa.  *See table(s) above for measurements and observations. Electronically signed by Deitra Mayo MD on 06/08/2020 at 10:57:28 PM.    Final    VAS [place] LOWER EXTREMITY VENOUS (DVT)  Result Date: 06/01/2020  Lower Venous DVTStudy Indications: Edema.  Limitations: Patient could not tolerate compressions. Patient was combative, threatening, non-compliant. Comparison Study: No prior studies. Performing Technologist: Darlin Coco  Examination Guidelines: A complete evaluation includes B-mode imaging, spectral Doppler, color Doppler, and power Doppler as needed of all accessible portions of each vessel. Bilateral testing is considered an integral part of a complete examination. Limited examinations for reoccurring indications may be performed as noted. The reflux portion of the exam is performed with the patient in reverse Trendelenburg.  +---------+---------------+---------+-----------+---------------+--------------+ RIGHT    CompressibilityPhasicitySpontaneityProperties     Thrombus Aging +---------+---------------+---------+-----------+---------------+--------------+ CFV      Full           Yes      Yes                                      +---------+---------------+---------+-----------+---------------+--------------+ SFJ      Full                                                             +---------+---------------+---------+-----------+---------------+--------------+  FV Prox                           Yes        Patent by color               +---------+---------------+---------+-----------+---------------+--------------+ FV Mid                           Yes        Patent by color               +---------+---------------+---------+-----------+---------------+--------------+ FV Distal                        Yes        Patent by color               +---------+---------------+---------+-----------+---------------+--------------+ PFV                              Yes        Patent by color               +---------+---------------+---------+-----------+---------------+--------------+ PTV                              Yes        Patent by color               +---------+---------------+---------+-----------+---------------+--------------+ PERO                             Yes        Patent by color               +---------+---------------+---------+-----------+---------------+--------------+   +---------+---------------+---------+-----------+---------------+--------------+ LEFT     CompressibilityPhasicitySpontaneityProperties     Thrombus Aging +---------+---------------+---------+-----------+---------------+--------------+ CFV                              Yes        Patent by color               +---------+---------------+---------+-----------+---------------+--------------+ SFJ                              Yes        Patent by color               +---------+---------------+---------+-----------+---------------+--------------+ FV Prox                          Yes        Patent by color               +---------+---------------+---------+-----------+---------------+--------------+ FV Mid                           Yes        Patent by color               +---------+---------------+---------+-----------+---------------+--------------+ FV Distal                        Yes        Patent by color                +---------+---------------+---------+-----------+---------------+--------------+  PFV                                                        Not visualized +---------+---------------+---------+-----------+---------------+--------------+ POP                                                        Not visualized +---------+---------------+---------+-----------+---------------+--------------+ PTV                              Yes        Patent by color               +---------+---------------+---------+-----------+---------------+--------------+ PERO                             Yes        Patent by color               +---------+---------------+---------+-----------+---------------+--------------+     Summary: RIGHT: - There is no evidence of deep vein thrombosis in the lower extremity. However, portions of this examination were limited- see technologist comments above.  LEFT: - There is no evidence of deep vein thrombosis in the lower extremity. However, portions of this examination were limited- see technologist comments above.  *See table(s) above for measurements and observations. Electronically signed by Ruta Hinds MD on 06/01/2020 at 11:31:17 AM.    Final    [place] EKG SITE RITE  Result Date: 06/12/2020 If Site Rite image not attached, placement could not be confirmed due to current cardiac rhythm.  IR LUMBAR DISC ASPIRATION W/IMG GUIDE  Result Date: 06/14/2020 INDICATION: 69 year old female currently admitted with ischemic colitis. 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 She is currently on flagyl and ciprofloxacin. She comes to our service for a fluoroscopy guided disc aspiration. EXAM: FLUOROSCOPY GUIDED L3-4 DISC ASPIRATION MEDICATIONS: The patient is currently admitted to the hospital and receiving intravenous antibiotics. The antibiotics were administered within an appropriate time frame prior to the initiation of the procedure.  ANESTHESIA/SEDATION: Fentanyl 50 mcg IV; Versed 1.5 mg IV Moderate Sedation Time:  23 The patient was continuously monitored during the procedure by the interventional radiology nurse under my direct supervision. COMPLICATIONS: None immediate. PROCEDURE: Informed written consent was obtained from the patient's healthcare proxy after a thorough discussion of the procedural risks, benefits and alternatives. All questions were addressed. Maximal Sterile Barrier Technique was utilized including caps, mask, sterile gowns, sterile gloves, sterile drape, hand hygiene and skin antiseptic. A timeout was performed prior to the initiation of the procedure. Patient was made to lie prone on the angiography table. The lumbar region was prepped and draped in the usual sterile fashion. Under fluoroscopy, the L3-4 disc space was identified. Approximately 4 cm to the right of midline, an 18 gauge x 20 cm trocar was advanced into the disc space under fluoroscopy guidance. The meningioma was removed and a 22 gauge x 20 cm needle was advanced into the disc. Multiple fine needle aspiration samples were obtained. The trocar was subsequently removed. The skin was cleaned and and a  sterile bandage was placed over the access site. The patient tolerated the procedure well without incident or complication. Samples collected were sent to the laboratory for microbiology analysis. IMPRESSION: Successful and uncomplicated fluoroscopy guided L3-4 disc aspiration. Memory Electronically Signed   By: Pedro Earls M.D.   On: 06/14/2020 09:01    Microbiology: Recent Results (from the past 240 hour(s))  Aerobic/Anaerobic Culture (surgical/deep wound)     Status: None (Preliminary result)   Collection Time: 06/11/20 10:42 AM   Specimen: Fine Needle Aspirate  Result Value Ref Range Status   Specimen Description NEEDLE ASPIRATE  Final   Special Requests INERVERTEBRAL DISC L3 L4 ASPIRATE  Final   Gram Stain NO WBC SEEN NO  ORGANISMS SEEN   Final   Culture   Final    NO GROWTH 4 DAYS NO ANAEROBES ISOLATED; CULTURE IN PROGRESS FOR 5 DAYS Performed at [facility], [address] [place], [place] [address]    Report Status PENDING  Incomplete  Culture, fungus without smear     Status: None (Preliminary result)   Collection Time: 06/11/20 10:42 AM   Specimen: Fine Needle Aspirate  Result Value Ref Range Status   Specimen Description NEEDLE ASPIRATE  Final   Special Requests INTERVERTEBRAL DISC L3 L4 ASPIRATE  Final   Culture   Final    No Fungi Isolated in 4 Weeks Performed at [facility] [address], [place], [place] [address]    Report Status PENDING  Incomplete  Fungus Stain     Status: None (Preliminary result)   Collection Time: 06/11/20 10:42 AM  Result Value Ref Range Status   FUNGUS STAIN Comment  Final    Comment: (NOTE) KOH/Calcofluor preparation:  no fungus observed. Performed At: [facility] [place], [place] [id number] Rush Farmer MD FO:[id number]    Fungal Source PENDING  Incomplete     Labs: Basic Metabolic Panel: Recent Labs  Lab 06/10/20 0030 06/11/20 1113 06/12/20 1656 06/13/20 0209  NA 132* 134* 132* 133*  K 4.0 3.8 4.2 4.2  CL 99 102 103 105  CO2 25 25 21* 21*  GLUCOSE 124* 109* 107* 98  BUN '10 8 10 14  '$ CREATININE 0.45 0.47 0.44 0.45  CALCIUM 8.6* 8.6* 8.4* 8.5*  MG 1.8  --   --   --   PHOS 2.7  --   --   --    Liver Function Tests: No results for input(s): AST, ALT, ALKPHOS, BILITOT, PROT, ALBUMIN in the last 168 hours. No results for input(s): LIPASE, AMYLASE in the last 168 hours. No results for input(s): AMMONIA in the last 168 hours. CBC: Recent Labs  Lab 06/10/20 0030 06/11/20 1113 06/12/20 1656 06/13/20 0209  WBC 6.8 5.0 5.4 5.2  NEUTROABS 4.7 3.5 3.2 3.3  HGB 8.5* 7.9* 10.0* 9.1*  HCT 26.5* 24.9* 32.0* 29.1*  MCV 98.9 99.2 100.9* 101.4*  PLT 368 421* 399 375   Cardiac Enzymes: No results for  input(s): CKTOTAL, CKMB, CKMBINDEX, TROPONINI in the last 168 hours. BNP: BNP (last 3 results) No results for input(s): BNP in the last 8760 hours.  ProBNP (last 3 results) No results for input(s): PROBNP in the last 8760 hours.  CBG: No results for input(s): GLUCAP in the last 168 hours.     Signed:  Kayleen Memos, MD [facility] 06/15/2020, 12:13 PM

## 2020-06-15 NOTE — Progress Notes (Signed)
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  She was recently admitted with a small bowel obstruction and diarrhea, discharged on 6/24.  She continued to have diarrhea after discharge, poor p.o. intake and was readmitted on 6/27 with shock, confusion.     PT Comments    Pt with limited participation but did sit edge of bed x and performed limited AAROM to B LEs and UEs.  Continue to recommend snf.  Pt is very resistant to movement and requires max coaxing.      Follow Up Recommendations  SNF     Equipment Recommendations  None recommended by PT    Recommendations for Other Services       Precautions / Restrictions Precautions Precautions: Fall Restrictions Weight Bearing Restrictions: No    Mobility  Bed Mobility Overal bed mobility: Needs Assistance Bed Mobility: Supine to Sit;Sit to Supine     Supine to sit: Total assist;+2 for physical assistance Sit to supine: +2 for physical assistance;Total assist   General bed mobility comments: total A +2 to roll and to perform supine <> sit. Needing max encouragement to participate, often obstinate and not wanting to participate  Transfers                 General transfer comment: Deferred due to limited participation on EOB  Ambulation/Gait                 Stairs             Wheelchair Mobility    Modified Rankin (Stroke Patients Only)       Balance Overall balance assessment: Needs assistance Sitting-balance support: Feet supported;Bilateral upper extremity supported;Single extremity supported Sitting balance-Leahy Scale: Poor Sitting balance - Comments: Did not heed to cues to sitting posture, mod-max A to maintain upright posture, Postural control: Posterior  lean                                  Cognition Arousal/Alertness: Awake/alert Behavior During Therapy: Agitated;Anxious Overall Cognitive Status: History of cognitive impairments - at baseline                                 General Comments: Pt agitated in session, often asking to lay back down, threatening to call the cops on therapists. Requried max encouragement to participate, and fatigues quickly when attmempting to kick (pt successfully tapped PTA's shoe 1x) or hit therapists with items (pt able to throw items with minimal force less than a foot)      Exercises General Exercises - Upper Extremity Shoulder Flexion: PROM;Both;5 reps;Seated Shoulder Extension: PROM;Both;5 reps;Seated Elbow Flexion: PROM;AAROM;Both;10 reps Elbow Extension: PROM;AAROM;Both;10 reps General Exercises - Lower Extremity Long Arc Quad: Both;AAROM;PROM;10 reps;Seated    General Comments        Pertinent Vitals/Pain Pain Assessment: Faces Faces Pain Scale: Hurts a little bit Pain Location: generalized Pain Descriptors / Indicators: Grimacing;Sharp;Moaning;Guarding Pain Intervention(s): Limited activity within patient's tolerance;Monitored during session;Repositioned    Home Living                      Prior Function  PT Goals (current goals can now be found in the care plan section) Acute Rehab PT Goals Patient Stated Goal: to call the cops on you Potential to Achieve Goals: Fair Progress towards PT goals: Progressing toward goals    Frequency    Min 2X/week      PT Plan Current plan remains appropriate    Co-evaluation PT/OT/SLP Co-Evaluation/Treatment: Yes Reason for Co-Treatment: Complexity of the patient's impairments (multi-system involvement);For patient/therapist safety PT goals addressed during session: Mobility/safety with mobility;Balance OT goals addressed during session: ADL's and self-care      AM-PAC PT "6 Clicks"  Mobility   Outcome Measure  Help needed turning from your back to your side while in a flat bed without using bedrails?: Total Help needed moving from lying on your back to sitting on the side of a flat bed without using bedrails?: Total Help needed moving to and from a bed to a chair (including a wheelchair)?: Total Help needed standing up from a chair using your arms (e.g., wheelchair or bedside chair)?: Total Help needed to walk in hospital room?: Total Help needed climbing 3-5 steps with a railing? : Total 6 Click Score: 6    End of Session Equipment Utilized During Treatment: Gait belt Activity Tolerance: Patient limited by fatigue (agitation) Patient left: with call bell/phone within reach;with family/visitor present;in bed;Other (comment) Nurse Communication: Mobility status PT Visit Diagnosis: Other symptoms and signs involving the nervous system (R29.898);Other abnormalities of gait and mobility (R26.89)     Time: 7902-4097 PT Time Calculation (min) (ACUTE ONLY): 24 min  Charges:  $Therapeutic Activity: 8-22 mins                     Bonney Leitz , PTA Acute Rehabilitation Services Pager [phone] Office [phone]     Necola Bluestein Artis Delay 06/15/2020, 5:20 PM

## 2020-06-15 NOTE — Progress Notes (Signed)
Nutrition Follow-up  DOCUMENTATION CODES:   Not applicable  INTERVENTION:   - Continue Boost Breeze po TID, each supplement provides 250 kcal and 9 grams of protein  - Continue Pro-stat 30 ml po TID, each supplement provides 100 kcal and 15 grams of protein  - Continue MVI with minerals daily  NUTRITION DIAGNOSIS:   Increased nutrient needs related to acute illness as evidenced by estimated needs.  Ongoing  GOAL:   Patient will meet greater than or equal to 90% of their needs  Progressing  MONITOR:   PO intake, Supplement acceptance, Diet advancement, Labs, Weight trends, Skin, I & O's  REASON FOR ASSESSMENT:   Consult Malnutrition Eval  ASSESSMENT:   69 year old female who presented from Group Home on 6/27 with diarrhea. 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 Recent admission 6/11-6/24 for diarrhea and hypotension and was found to have SBO vs ileus which resolved with NG tube. Admitted for severe sepsis secondary to acute colitis with RLE cellulitis.  6/30 - diet advanced to GI soft 7/02 - s/p flex sig which revealed hemorrhoids, small rectal prolapse, mucosa in sigmoid colon (biopsied); rectal tube d/c 7/05 - s/p CT of abdomen and pelvis revealed thrombus in right lower lobe of lung, no free air in abdomen 7/09 - s/p fluoroscopy guided disc aspiration 7/11 - s/p PICC  Pt to complete 6 weeks of IV antibiotics. Pt is medically cleared for discharge.  PO intake has improved and is now averaging ~53% at meals.  Weight trending down since admission. Pt with a 5.6 kg weight los since 05/31/20. This is a 6.4% weight loss in less than 1 month which is significant for timeframe. Suspect weight loss related to several days of pt declining meals. Suspect PO intake will improve once pt returns to familiar environment.  Spoke with pt at bedside. Pt reports that her appetite is improving and that she ate  "everything" from her lunch tray. Pt states that she likes the Boost Breeze supplements.  Meal Completion: 0-100% x last 8 recorded meals (averaging 53%)  Medications reviewed and include: calcium carbonate, Questran, Boost Breeze TID, Pro-stat 30 ml TID, MVI with minerals, protonix, florastor, IV abx  Labs reviewed: sodium 133, hemoglobin 9.1  UOP: 1650 ml x 24 hours  Diet Order:   Diet Order            DIET SOFT Room service appropriate? Yes; Fluid consistency: Thin  Diet effective now                 EDUCATION NEEDS:   No education needs have been identified at this time  Skin:  Skin Assessment: Skin Integrity Issues: Stage I: right heel Unstageable: left heel Incisions: vertebral column Other: MASD to coccyx  Last BM:  06/15/20 type 7  Height:   Ht Readings from Last 1 Encounters:  06/04/20 5\' 7"  (1.702 m)    Weight:   Wt Readings from Last 1 Encounters:  06/12/20 81.6 kg    Ideal Body Weight:  61.4 kg  BMI:  Body mass index is 28.18 kg/m.  Estimated Nutritional Needs:   Kcal:  1950-2150  Protein:  95-110 grams  Fluid:  > 1.9 L    08/13/20, MS, RD, LDN Inpatient Clinical Dietitian Please see [facility] for contact information.

## 2020-06-16 LAB — AEROBIC/ANAEROBIC CULTURE W GRAM STAIN (SURGICAL/DEEP WOUND)
Culture: NO GROWTH
Gram Stain: NONE SEEN

## 2020-06-16 MED ORDER — PROSOURCE PLUS PO LIQD
30.0000 mL | Freq: Three times a day (TID) | ORAL | Status: DC
  Administered 2020-06-16 (×2): 30 mL via ORAL
  Filled 2020-06-16 (×5): qty 30

## 2020-06-16 NOTE — Progress Notes (Signed)
Patient ID: Nicole [name], female   DOB: 12-Oct-1951, 69 y.o.   MRN: [id number] Patient was able to be discharged to group home on 06/15/2020 however arrangements could not be made.  Patient seen and examined at bedside today on 06/16/2020.  She is medically stable for discharge.  Please refer to the discharge summary done by Dr. Dow Adolph on 06/15/2020 for full details.

## 2020-06-16 NOTE — TOC Progression Note (Addendum)
Transition of Care [facility]) - Progression Note    Patient Details  Name: Nicole [name] MRN: [id number] Date of Birth: 21-Jun-1951  Transition of [facility]) CM/SW Contact  Nadene Rubins Adria Devon, RN Phone Number: 06/16/2020, 10:31 AM  Clinical Narrative:     Sherron Monday to [name]. [name] aware patient ready for discharge. NCM has left message for April at group home.    April at group home texted [facility] requesting Friday discharge. NCM tyexted April back asking her to call NCM. We cannot continue to hold. However group home is having staffing issues.   Will follow up with [facility]fusion company and home health to see if they could support a Friday discharge.  Spoke to [facility] with [facility].   April asking how frequently home health RN will visit. Called [place] at [facility] and left a message.   Mary with [facility] returned call. They will be able to provide a nurse Saturday morning to provide teaching at Group Home. She will call April directly.  April at [facility] [phone]  1310 April with group home returned call. She has spoken with Morrie Sheldon with [facility]. April is ready for patient to be discharge tomorrow by [facility] to [address] [phone]. April has spoken with Morrie Sheldon with [facility]. [facility] will provide a home health nurse Friday morning to do teaching at the group home for IV ABX. April has spoken to [facility].    NCM confirmed plan with Pam with [facility] and [facility] with [facility].   Patient will discharge from the hospital on June 17, 2020 after 2pm doses of IV ABX.  [facility] will arrange a HHRN to go to the group home 06/18/20 to administer morning doses of ABX and provide teaching to April and staff.   Expected Discharge Plan: Home w Home Health Services Barriers to Discharge:  (Home health needs)  Expected Discharge Plan and Services Expected Discharge Plan: Home w Home Health Services In-house Referral: Clinical Social Work Discharge Planning  Services: CM Consult Post Acute Care Choice: Resumption of Svcs/PTA Provider, Home Health Living arrangements for the past 2 months: Group Home Expected Discharge Date: 06/15/20                         HH Arranged: PT, OT, RN HH Agency: [facility], [facility] Date [facility] Agency Contacted: 06/13/20 Time HH Agency Contacted: 0900 Representative spoke with at [facility] Agency: Angela Adam, Jeri Modena   Social Determinants of Health (SDOH) Interventions    Readmission Risk Interventions Readmission Risk Prevention Plan 06/04/2020  Post Dischage Appt Complete  Medication Screening Complete  Transportation Screening Complete  Some recent data might be hidden

## 2020-06-17 DIAGNOSIS — N179 Acute kidney failure, unspecified: Secondary | ICD-10-CM

## 2020-06-17 DIAGNOSIS — D638 Anemia in other chronic diseases classified elsewhere: Secondary | ICD-10-CM

## 2020-06-17 LAB — BASIC METABOLIC PANEL
Anion gap: 9 (ref 5–15)
BUN: 9 mg/dL (ref 8–23)
CO2: 26 mmol/L (ref 22–32)
Calcium: 8.9 mg/dL (ref 8.9–10.3)
Chloride: 96 mmol/L — ABNORMAL LOW (ref 98–111)
Creatinine, Ser: 0.51 mg/dL (ref 0.44–1.00)
GFR calc Af Amer: >60 mL/min (ref >60)
GFR calc non Af Amer: >60 mL/min (ref >60)
Glucose, Bld: 119 mg/dL — ABNORMAL HIGH (ref 70–99)
Potassium: 4 mmol/L (ref 3.5–5.1)
Sodium: 131 mmol/L — ABNORMAL LOW (ref 135–145)

## 2020-06-17 MED ORDER — HEPARIN SOD (PORK) LOCK FLUSH 100 UNIT/ML IV SOLN
250.0000 [IU] | INTRAVENOUS | Status: AC | PRN
  Administered 2020-06-17: 250 [IU]
  Filled 2020-06-17: qty 2.5

## 2020-06-17 MED ORDER — HEPARIN SOD (PORK) LOCK FLUSH 100 UNIT/ML IV SOLN
250.0000 [IU] | INTRAVENOUS | Status: DC | PRN
  Filled 2020-06-17: qty 2.5

## 2020-06-17 NOTE — Plan of Care (Signed)

## 2020-06-17 NOTE — Discharge Summary (Signed)
Physician Discharge Summary  Nicole [name]:[id number] DOB: 05-24-1951 DOA: 05/30/2020  PCP: Vicenta Aly, [name]  Admit date: 05/30/2020 Discharge date: 06/17/2020  Admitted From: Group home  disposition: Group home  Recommendations for Outpatient Follow-up:  1. Follow up with PCP in 1 week with repeat CBC/BMP 2. Outpatient follow-up with GI/ID/palliative care medicine 3. Follow up in ED if symptoms worsen or new appear   Home Health: PT/OT/RN Equipment/Devices: None  Discharge Condition: Stable CODE STATUS: DNR Diet recommendation: Heart healthy  Brief/Interim Summary: 69 year old female with history of seizure disorder, stroke with left hemiparesis, hypertension, hyperlipidemia presents from group home with persistent diarrhea and generalized weakness. C. difficile PCR and GI pathogen negative. CT of the abdomen and pelvis showed Colonic wall thickening with pericolonic edema and fat stranding involving the distal descending through sigmoid colon consistent with colitis, likely infectious or inflammatory.Flex sigmoidoscopy showing ischemic colitis. Rectal tube was removed. She was started on cholestyramine and low fiber diet.Repeat abdomen and pelvis on 06/07/2020 showed thrombus in the right lower lobe on the lung, no bowel perforation. She was started on full dose Lovenox. Venous duplex of the lower extremities revealed age indeterminate DVT involving the right common femoral vein and right proximal profunda vein.  In view of multiple medical problems, pt refusal to take heparin shots for DVT prophylaxis, refusing blood draws, refusing to eat, deconditioning, after extensive discussion with [facility]CPOA, palliative care was consulted to assist with establishing goals of care.  Was started on IV antibiotics Cipro and Flagyl on 7/7 by GI for ischemic colitis with suspected diverticulitis and possible colonic ulcer as an etiology.  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 ID consulted. Aspiration of L3-L4 completed on 06/11/2020 by interventional radiology. Now on IV Vancomycin and rocephin empirically by ID.  Patient will need 6 weeks of IV antibiotics. Orderssigned for OPAT.She'll be discharged to group home if arrangements can be made today.  Discharge Diagnoses:   Diarrhea, improving, secondary to ischemic colitis, thought to be due to diverticulitis with possible colonic ulcer Seen by GI, post flex sigmoidoscopy/colonoscopy Path report from colonoscopy done on 06/04/2020 showed colonic mucosa and inflamed granulation tissue consistent with ulcer. The differential diagnosis includes segmental colitis associated with diverticulitis. Clinical correlation is essential. No diverticulitis seen on CT scan or colonoscopy.  C. difficile PCR and GI panel negative Started on IV Cipro, IV Flagyl, cholestyramine, and gentle IV fluid hydration on 06/09/2020.IV Flagyl and IV Cipro stopped on 06/12/2020 by ID. Continue cholestyramine 3 times daily as recommended by GI. Continue Florastor to 50 mg twice daily Follow-up with GI outpatient.  Sigmoidoscopy done on 06/04/2020: Altered vascular, congested, friable (with spontaneous bleeding), nodular, plaque covered, ulcerated and vascular-pattern-decreased mucosa in the sigmoid colon and in the descending colon.  Follow-up with   Resolved nausea/vomiting No abdominal pain on exam, bowel sounds present. Continue supportive careas needed Denies any nausea at the time of this visit. No use of antiemetics in the last 4 days  Acute pulmonary embolus in the right lower lobe of the lung,(incidental finding on the CT of the abdomen pelvis)/right lower extremity DVT Continue full dose subcu Lovenox twice daily. Was unable to use a DOAC agent due to interaction with Tegretol, decreasing the effect of the  anticoagulation. Follow-up with your PCP  L3-4 discitis on CT scan, confirmed by MRI of lumbar spine 06/07/20 post intervertebral L3-4 disc aspirate on 06/11/2020 by interventional  radiology Independently viewed CT scan and MRI of lumbar spine showing evidence of L3-4 discitis. MRSA screen positive on 05/31/2020 Blood cultures drawn on 05/30/2020 negative final. Continue to follow cultures from L3-4 disc aspirate Per ID will need 6 weeks of IV antibiotics IV vancomycin and Rocephin PICC line ordered,OPAT orders signed. TOC assisting with home health services at the group home Will need to follow-up with infectious disease outpatient.   Seizure disorder Stable, no reported seizure activity Continue home Keppra and carbamazepine Follow-up with PCP or neurology  Hyperlipidemia Continue Lipitor  History of unspecified CVA Continue statin  GERD Continue PPI  Physical debility PT OT assessment recommended SNF Sister prefers return to group home with home health PT OT RN Home aide [facility] assisting with disposition Continue PT OT with assistance and fall precautions   Right ovarian cyst Diagnosed by CT on 06/07/2020 Recommend 6 months follow-up of pelvic ultrasound Follow-up with your PCP with possible referral with a gynecologist  Remote sacral fracture Seen on CT scan Follow-up with your PCP  Goals of care DNR Seen by palliative care team Follow-up with palliative care medicine for symptoms management and assistance with goals of care  Chronic pressure wounds, POA Deep tissue pressure injury of the heels, POA Pressure Injury 06/07/20 Heel Left Unstageable - Full thickness tissue loss in which the base of the injury is covered by slough (yellow, tan, gray, green or brown) and/or eschar (tan, brown or black) in the wound bed. (Active)  06/07/20 0800  Location: Heel  Location Orientation: Left  Staging: Unstageable - Full thickness tissue loss in which the base of the  injury is covered by slough (yellow, tan, gray, green or brown) and/or eschar (tan, brown or black) in the wound bed.  Wound Description (Comments):   Present on Admission:     Pressure Injury 06/07/20 Heel Anterior;Right;Left Stage 1 - Intact skin with non-blanchable redness of a localized area usually over a bony prominence. (Active)  06/07/20 0800  Location: Heel  Location Orientation: Anterior;Right;Left  Staging: Stage 1 - Intact skin with non-blanchable redness of a localized area usually over a bony prominence.  Wound Description (Comments):   Present on Admission:  Wound care consulted and recommendations given      Discharge Instructions  Discharge Instructions    [facility] pharmacist to adjust dose for Vancomycin, Aminoglycosides and other anti-infective therapies as requested by physician.   Complete by: As directed    [facility] to provide Cath Flo '2mg'$    Complete by: As directed    Administer for PICC line occlusion and as ordered by physician for other access device issues.   Anaphylaxis Kit: Provided to treat any anaphylactic reaction to the medication being provided to the patient if First Dose or when requested by physician   Complete by: As directed    Epinephrine '1mg'$ /ml vial / amp: Administer 0.'3mg'$  (0.6m) subcutaneously once for moderate to severe anaphylaxis, nurse to call physician and pharmacy when reaction occurs and call 911 if needed for immediate care   Diphenhydramine '50mg'$ /ml IV vial: Administer 25-'50mg'$  IV/IM PRN for first dose reaction, rash, itching, mild reaction, nurse to call physician and pharmacy when reaction occurs   Sodium Chloride 0.9% NS 5028mIV: Administer if needed for hypovolemic blood pressure drop or as ordered by physician after call to physician with anaphylactic reaction   Change dressing on IV access line weekly and PRN   Complete by: As directed    Diet - low sodium heart  healthy   Complete by: As  directed    Discharge wound care:   Complete by: As directed    Place Criticaid clear (purple and white tube) around anus and inner/upper thighs. Then sprinkle a heavy layer of antifungal powder (green and white bottle in clean utility) over these same areas.  Apply iodine to the left heel area of discoloration, allow to air dry. Place foot back into Prevalon boot   Flush IV access with Sodium Chloride 0.9% and Heparin 10 units/ml or 100 units/ml   Complete by: As directed    Home infusion instructions - [facility]   Complete by: As directed    Instructions: Flush IV access with Sodium Chloride 0.9% and Heparin 10units/ml or 100units/ml   Change dressing on IV access line: Weekly and PRN   Instructions Cath Flo '2mg'$ : Administer for PICC Line occlusion and as ordered by physician for other access device   [facility] pharmacist to adjust dose for: Vancomycin, Aminoglycosides and other anti-infective therapies as requested by physician   Increase activity slowly   Complete by: As directed    Method of administration may be changed at the discretion of home infusion pharmacist based upon assessment of the patient and/or caregiver's ability to self-administer the medication ordered   Complete by: As directed      Allergies as of 06/17/2020      Reactions   Seasonal Ic [cholestatin]    Runny nose      Medication List    STOP taking these medications   diclofenac 75 MG EC tablet Commonly known as: VOLTAREN     TAKE these medications   atorvastatin 10 MG tablet Commonly known as: LIPITOR Take 10 mg by mouth every Monday, Wednesday, and Friday.   Benefiber Chew Chew 1 tablet by mouth daily.   busPIRone 15 MG tablet Commonly known as: BUSPAR Take 15 mg by mouth 2 (two) times daily.   calcium carbonate 600 MG Tabs tablet Commonly known as: OS-CAL Take 600 mg by mouth 3 (three) times daily with meals.   carbamazepine 100 MG 12 hr tablet Commonly known as:  TEGRETOL XR Take 3 tablets (300 mg total) by mouth 2 (two) times daily. Reducing due to low sodium levels recently. What changed: additional instructions   cefTRIAXone  IVPB Commonly known as: ROCEPHIN Inject 2 g into the vein daily. Indication:  discitis First Dose: Yes Last Day of Therapy:  07/25/2020 Labs - Once weekly:  CBC/D and BMP, Labs - Every other week:  ESR and CRP Method of administration: IV Push Method of administration may be changed at the discretion of home infusion pharmacist based upon assessment of the patient and/or caregiver's ability to self-administer the medication ordered.   CertaVite/Antioxidants Tabs Take 1 tablet by mouth daily. Advance , daily   cholestyramine 4 g packet Commonly known as: QUESTRAN Take 1 packet (4 g total) by mouth 3 (three) times daily.   enoxaparin 80 MG/0.8ML injection Commonly known as: LOVENOX Inject 0.8 mLs (80 mg total) into the skin every 12 (twelve) hours.   feeding supplement (PRO-STAT SUGAR FREE 64) Liqd Take 30 mLs by mouth 3 (three) times daily.   gabapentin 300 MG capsule Commonly known as: NEURONTIN Take 2 capsules (600 mg total) by mouth at bedtime. What changed:   how much to take  when to take this  additional instructions   levETIRAcetam 500 MG tablet Commonly known as: KEPPRA Take 1 tablet (500 mg total) by mouth 2 (  two) times daily.   pantoprazole 40 MG tablet Commonly known as: PROTONIX Take 40 mg by mouth daily.   Pulmicort Flexhaler 180 MCG/ACT inhaler Generic drug: budesonide Inhale 1 puff into the lungs 2 (two) times daily as needed. What changed:   when to take this  reasons to take this   saccharomyces boulardii 250 MG capsule Commonly known as: FLORASTOR Take 1 capsule (250 mg total) by mouth 2 (two) times daily.   Selenium 200 MCG Tbcr Take 1 tablet by mouth daily.   vancomycin  IVPB Inject 1,000 mg into the vein every 12 (twelve) hours. Indication:  discitis First Dose:  Yes Last Day of Therapy:  07/25/2020 Labs - "Sunday/Monday:  CBC/D, BMP, and vancomycin trough. Labs - Thursday:  BMP and vancomycin trough Labs - Every other week:  ESR and CRP Method of administration:Elastomeric Method of administration may be changed at the discretion of the patient and/or caregiver's ability to self-administer the medication ordered.   Vitamin D3 50 MCG (2000 UT) Tabs Take 2,000 Units by mouth daily.            Durable Medical Equipment  (From admission, onward)         Start     Ordered   06/14/20 1432  For home use only DME Other see comment  Once       Comments: Purewick for home  Disposable pads for hospital bed at home  Question:  Length of Need  Answer:  Lifetime   06/14/20 1432   06/09/20 1808  For home use only DME standard manual wheelchair with seat cushion  Once       Comments: Patient suffers from ambulatory dysfunction which impairs their ability to perform daily activities like walking in the home.  A walker will not resolve issue with performing activities of daily living. A wheelchair will allow patient to safely perform daily activities. Patient can safely propel the wheelchair in the home or has a caregiver who can provide assistance. Length of need 6 months. Accessories: elevating leg rests, wheel locks, extensions and anti-tippers.   06/09/20 1808           Discharge Care Instructions  (From admission, onward)         Start     Ordered   06/17/20 0000  Discharge wound care:       Comments: Place Criticaid clear (purple and white tube) around anus and inner/upper thighs. Then sprinkle a heavy layer of antifungal powder (green and white bottle in clean utility) over these same areas.  Apply iodine to the left heel area of discoloration, allow to air dry. Place foot back into Prevalon boot   06/17/20 0850   06/13/20 0000  Change dressing on IV access line weekly and PRN  (Home infusion instructions - [facility] )         07"$ /11/21 1025          Follow-up Information    Vicenta Aly, [name]. Call in 1 day(s).   Specialty: Nurse Practitioner Why: please call for a post hospital follow up appointment Contact information: [place] [address] [phone]        Thayer Headings, MD Follow up.   Specialty: Infectious Diseases Contact information: [address] [place] [address] [phone]        Wilford Corner, MD. Call in 1 day(s).   Specialty: Gastroenterology Why: please call for a post hospital follow up appointment  Contact information: [address] [place] [address] [phone]              Allergies  Allergen Reactions  . Seasonal Ic [Cholestatin]     Runny nose    Consultations:  GI/ID/IR   Procedures/Studies: DG Chest 2 View  Result Date: 06/07/2020 CLINICAL DATA:  Follow-up. EXAM: CHEST - 2 VIEW COMPARISON:  06/01/2019 FINDINGS: Lungs are adequately inflated demonstrate subtle hazy prominence of the central bronchovascular markings. Mild hazy density over the left retrocardiac region which may be due to layering effusion/atelectasis. Cardiomediastinal silhouette and remainder of the exam is unchanged. IMPRESSION: Minimal opacification in the left retrocardiac region which may be due to layering effusion/atelectasis. Mild stable prominence of the central bronchovascular markings which may be due to mild vascular congestion. Electronically Signed   By: Marin Olp M.D.   On: 06/07/2020 17:19   DG Abd 1 View  Result Date: 06/06/2020 CLINICAL DATA:  Nausea vomiting. EXAM: ABDOMEN - 1 VIEW COMPARISON:  05/31/2020 CT evaluation and prior plain film evaluation FINDINGS: Abundant stool is present in the ascending colon. Some suggestion of haustral thickening particularly in the cecum. Pattern of distension is similar to previous imaging. Spinal degenerative changes with similar appearance to prior studies  greatest in the mid lumbar spine. IMPRESSION: Abundant stool in the ascending colon with suggestion of haustral thickening particularly in the cecum. Haustral thickening likely reflecting colitis in this patient with known colitis. No change in colonic distension compared to recent imaging. Electronically Signed   By: Zetta Bills M.D.   On: 06/06/2020 13:24   MR Lumbar Spine W Wo Contrast  Result Date: 06/07/2020 CLINICAL DATA:  Low back pain, no red flags. EXAM: MRI LUMBAR SPINE WITHOUT AND WITH CONTRAST TECHNIQUE: Multiplanar and multiecho pulse sequences of the lumbar spine were obtained without and with intravenous contrast. CONTRAST:  8.17m GADAVIST GADOBUTROL 1 MMOL/ML IV SOLN COMPARISON:  CT abdomen/pelvis 06/07/2020, CT abdomen/pelvis 05/31/2020 FINDINGS: There is mild motion degradation of multiple sequences. Segmentation: 5 lumbar vertebrae are assumed. The caudal most well-formed intervertebral disc is designated L5-S1. Alignment: Mild L3-L4 right lateral listhesis was better appreciated on CT abdomen/pelvis performed earlier the same day. Vertebrae: Redemonstrated prominent endplate irregularity at L3-L4 with mild L3 and L4 vertebral body height loss. Vertebral body height is otherwise maintained. There is diffuse nonspecific heterogeneous marrow signal. Multilevel degenerative endplate irregularity and predominantly fatty degenerative endplate marrow signal. Conus medullaris and cauda equina: Conus extends to the L1-L2 level. No signal abnormality within the visualized distal spinal cord. No definite abnormal enhancement within the spinal canal. Paraspinal and other soft tissues: No abnormality identified within included portions of the abdomen/retroperitoneum. Redemonstrated nonspecific presacral edema. Disc levels: Multilevel disc degeneration. Most notably there is advanced L3-L4 disc degeneration and moderate disc degeneration at L4-L5 and L5-S1. T12-L1: Facet arthrosis. No significant disc  herniation or stenosis. L1-L2: Minimal disc bulge. Mild facet arthrosis. No significant disc herniation or stenosis. L2-L3: Minimal disc bulge with endplate osteophytes. Moderate facet arthrosis. No significant spinal canal stenosis. Mild/moderate left neural foraminal narrowing. L3-L4: Advanced disc space narrowing with fluid signal in the disc space. Prominent endplate irregularity. There is enhancement along the disc margins and endplates. Disc bulge with osteophyte ridge. Disc osteophyte ridge is most prominent within the central through left subarticular zones. Advanced facet arthrosis with ligamentum flavum hypertrophy. Severe central and left subarticular stenosis. Moderate right with severe left neural foraminal narrowing. L4-L5: Disc bulge. Superimposed broad-based right center to right  foraminal disc protrusion. Osteophyte ridge greatest along the right lateral aspect of the disc space. Advanced facet arthrosis (greater on the right) with ligamentum flavum hypertrophy. Severe right subarticular narrowing with potential to affect the descending right L5 nerve root. Moderate central canal stenosis. Severe right with moderate left neural foraminal narrowing. L5-S1: Disc bulge asymmetric to the right. Associated osteophyte ridge greatest along the right lateral aspect of the disc space. Moderate/advanced facet arthrosis with ligamentum flavum hypertrophy. Moderate right subarticular narrowing with crowding of the descending right S1 nerve root. Central canal patent. Bilateral neural foraminal narrowing (severe right, mild left). Impression #1 will be called to the ordering clinician or representative by the Radiologist Assistant, and communication documented in the PACS or [employer]. IMPRESSION: At L3-L4, there is advanced disc space narrowing with fluid signal in the disc space. Prominent endplate irregularity with mild L3 and L4 vertebral body height loss. There is enhancement along the disc margins  and adjacent endplates. Findings are suspicious for possible discitis/osteomyelitis. Also at this level, there is a disc bulge. Disc osteophyte ridge which is most prominent within the central through left subarticular zones. Advanced facet arthrosis with ligamentum flavum hypertrophy. Severe central and left subarticular stenosis. Bilateral neural foraminal narrowing (moderate right, severe left). Additional levels of lumbar spondylosis, most notably as follows. At L4-L5, there is multifactorial severe right subarticular narrowing with potential to affect the descending right L5 nerve root. Moderate central canal stenosis. Bilateral neural foraminal narrowing (severe right, moderate left). At L5-S1, there is multifactorial moderate right subarticular narrowing with crowding of the descending right S1 nerve root. Bilateral neural foraminal narrowing (severe right, mild left). Diffuse nonspecific heterogeneous marrow signal. Redemonstrated nonspecific mild presacral edema. Electronically Signed   By: Kellie Simmering DO   On: 06/07/2020 20:46   CT ABDOMEN PELVIS W CONTRAST  Result Date: 06/07/2020 CLINICAL DATA:  Abdominal distention. Nausea, vomiting. Bowel obstruction suspected. EXAM: CT ABDOMEN AND PELVIS WITH CONTRAST TECHNIQUE: Multidetector CT imaging of the abdomen and pelvis was performed using the standard protocol following bolus administration of intravenous contrast. CONTRAST:  172m OMNIPAQUE IOHEXOL 350 MG/ML SOLN COMPARISON:  Two-view abdomen on 06/07/2020, CT of the abdomen and pelvis on 05/31/2020 FINDINGS: Lower chest: Thrombus is identified within the RIGHT LOWER lobe pulmonary artery and branches. The pulmonary arteries are not fully evaluated on this study of the abdomen. There are small bilateral pleural effusions associated with bibasilar atelectasis. A 4 millimeter nodule in the RIGHT middle lobe is adjacent to the minor fissure and likely represents intrafissural lymph node, not previously  imaged. Coronary artery calcifications and coronary stent. Heart size is normal. No pericardial effusion. Hepatobiliary: Cholecystectomy.  Liver is unremarkable. Pancreas: Unremarkable. No pancreatic ductal dilatation or surrounding inflammatory changes. Spleen: Normal in size without focal abnormality. Adrenals/Urinary Tract: Adrenal glands are normal in appearance. A subcentimeter low-attenuation lesion within the midpole region of the LEFT kidney likely represents a cyst but is too small fully characterize. No hydronephrosis. The ureters are unremarkable. Stomach/Bowel: Stomach is normal in appearance. Small bowel loops are normal in caliber and wall thickness. Appendix is normal. There is colonic wall thickening, mucosal enhancement of the descending and sigmoid colon. The ascending and transverse colon have normal wall thickness. There is significant stool burden. Vascular/Lymphatic: There is minimal atherosclerotic calcification of the abdominal aorta. No aneurysm. Multiple small retroperitoneal lymph nodes appear stable. Reproductive: Hysterectomy. Low-attenuation in the RIGHT adnexal region is 1.4 centimeters, stable in appearance. LEFT adnexal region is unremarkable. Other: There is persistent  presacral edema. Musculoskeletal: Significant sclerosis and endplate irregularity at L3-4, similar to the previous exam. These changes are new since the PET scan performed in 2010 and although chronic, is difficult to entirely exclude discitis. There are significant degenerative changes throughout the lumbar spine with disc height loss and facet hypertrophy at multiple levels. Remote sacral fracture. No acute fracture. IMPRESSION: 1. 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 The pulmonary arteries are not fully evaluated on this study of the abdomen. 2. Small bilateral pleural effusions associated with bibasilar atelectasis. 3. Coronary artery disease. 4. Persistent changes of colitis  involving the descending and sigmoid colon, slightly improved since prior study. Significant stool burden. 5. Persistent sclerosis and endplate irregularity at L3-4, similar to the previous exam. These changes are new since the PET scan performed in 2010 and although chronic, is difficult to entirely exclude discitis. Recommend further evaluation with MRI of the lumbar spine with and without contrast. 6. Stable appearance of presacral edema, of uncertain significance. MRI of the lumbar spine may help characterize further. 7. Stable appearance of 1.4 centimeter RIGHT ovarian cyst. Recommend follow-up pelvic ultrasound in 6 months. 8. Remote sacral fracture. 9. Aortic Atherosclerosis (ICD10-I70.0). Critical Value/emergent results were called by telephone at the time of interpretation on 06/07/2020 at 6:18 pm to provider Hosie Poisson , who verbally acknowledged these results. Electronically Signed   By: Nolon Nations M.D.   On: 06/07/2020 18:19   CT ABDOMEN PELVIS W CONTRAST  Result Date: 05/31/2020 CLINICAL DATA:  Abdominal abscess/infection suspected Diarrhea. Patient with history of vesicle vaginal fistula closure. Recent bowel obstruction. EXAM: CT ABDOMEN AND PELVIS WITH CONTRAST TECHNIQUE: Multidetector CT imaging of the abdomen and pelvis was performed using the standard protocol following bolus administration of intravenous contrast. CONTRAST:  14m OMNIPAQUE IOHEXOL 300 MG/ML  SOLN COMPARISON:  Multiple recent radiographs. FINDINGS: Lower chest: Small bilateral pleural effusions with compressive atelectasis. Mild wall thickening of the distal esophagus. Small amount of contrast in the distal esophagus. Hepatobiliary: No focal liver abnormality is seen. No gallstones, gallbladder wall thickening, or biliary dilatation. Pancreas: No ductal dilatation or inflammation. Spleen: Normal in size without focal abnormality. Splenule at the hilum. Adrenals/Urinary Tract: Mild left adrenal thickening without  dominant nodule. Normal right adrenal gland. No hydronephrosis or perinephric edema. Homogeneous renal enhancement with symmetric excretion on delayed phase imaging. There is some early excretion of IV contrast on initial exam. Subcentimeter low-density lesion in the upper left kidney is too small to characterize but likely cyst. Urinary bladder is partially distended. There is excreted IV contrast in the dependent urinary bladder. Stomach/Bowel: There is colonic wall thickening with pericolonic stranding involving the distal descending through sigmoid colon. Small amount of liquid stool in the distal colon. There is a moderate volume of stool in the remainder of the colon. There is mild transverse colonic redundancy. The appendix is not definitively visualized. There is no small bowel dilatation or evidence of obstruction. Stomach is unremarkable. Vascular/Lymphatic: Aortic atherosclerosis. No aortic aneurysm. Patent portal vein. There multiple prominent retroperitoneal nodes are all subcentimeter short axis. Reproductive: Post hysterectomy. Left ovary appears in situ and quiescent. 15 mm fluid density structure in the right adnexa is likely an ovarian cyst. Other: Pericolonic edema and fat stranding from the descending through the sigmoid colon. Small amount of free fluid in both pericolic gutters, left anterior pelvis, presacral region. There is presacral edema. 15 mm fluid density structure in the right pelvis is likely an ovarian cyst, however no prior  exams are available to confirm chronicity, and there is mild adjacent stranding. No free intra-abdominal air. Musculoskeletal: L3-L4 endplate sclerosis with endplate irregularity and erosions. Slight lateral translation of L3 on L4. mild L4 superior endplate compression fracture. Anterior osteophytes that are fragmented at this level. There is severe canal stenosis at this level. Probable remote sacral fracture. Additional endplate spurring and degenerative disc  disease with facet hypertrophy throughout the lumbar spine. IMPRESSION: 1. Colonic wall thickening with pericolonic edema and fat stranding involving the distal descending through sigmoid colon consistent with colitis, likely infectious or inflammatory. 2. Small amount of free fluid in both pericolic gutters, left anterior pelvis, and presacral region, likely reactive. There is a 15 mm fluid density structure in the right adnexa, felt to represent a small ovarian cyst, however there is mild adjacent stranding in this area and no prior exams are available to confirm chronicity. Small abscess is difficult to exclude, but felt less likely. 3. Small bilateral pleural effusions with compressive atelectasis. 4. L3-L4 endplate sclerosis with endplate irregularity and erosions. No prior exams are available for comparison to establish chronicity. Given there multiple prominent retroperitoneal lymph nodes and presacral edema, consider further evaluation with lumbar spine MRI. 5. Mild wall thickening of the distal esophagus containing a small amount of enteric contrast, suggesting reflux. Aortic Atherosclerosis (ICD10-I70.0). Electronically Signed   By: Narda Rutherford M.D.   On: 05/31/2020 01:49   DG CHEST PORT 1 VIEW  Result Date: 05/31/2020 CLINICAL DATA:  Hypoxia EXAM: PORTABLE CHEST 1 VIEW COMPARISON:  None. FINDINGS: The heart size and mediastinal contours are within normal limits. Both lungs are clear. The visualized skeletal structures are unremarkable. IMPRESSION: No acute airspace disease. Electronically Signed   By: Deatra Robinson M.D.   On: 05/31/2020 01:29   DG CHEST PORT 1 VIEW  Result Date: 05/19/2020 CLINICAL DATA:  Nasogastric tube placement. EXAM: PORTABLE CHEST 1 VIEW COMPARISON:  Same day. FINDINGS: The heart size and mediastinal contours are within normal limits. Stable bibasilar atelectasis or infiltrates are noted. Nasogastric tube tip is seen in the expected position of the proximal stomach. The  visualized skeletal structures are unremarkable. IMPRESSION: Nasogastric tube tip seen in expected position of the proximal stomach. Electronically Signed   By: Lupita Raider M.D.   On: 05/19/2020 14:11   DG CHEST PORT 1 VIEW  Result Date: 05/19/2020 CLINICAL DATA:  Cough and shortness of breath EXAM: PORTABLE CHEST 1 VIEW COMPARISON:  February 06, 2013 FINDINGS: There is a shallow degree of inspiration. There is ill-defined airspace opacity in the lung bases, concerning for a degree of pneumonia in the bases. Lungs elsewhere clear. Heart size and pulmonary vascular normal. No adenopathy. There is arthropathy in the left shoulder. IMPRESSION: Shallow degree of inspiration with ill-defined airspace opacity concerning for pneumonia in the bases. The degree of shallow inspiration raises concern for a degree of underlying restrictive disease. Lungs elsewhere clear.  Cardiac silhouette normal.  No adenopathy. Electronically Signed   By: Bretta Bang III M.D.   On: 05/19/2020 12:00   DG Abd 2 Views  Result Date: 06/07/2020 CLINICAL DATA:  Vomiting EXAM: ABDOMEN - 2 VIEW COMPARISON:  Prior abdominal radiograph from 06/06/2020 FINDINGS: Colonic distension with stool in the ascending colon. Relatively under distended distal colon. Increasing gastric distension. On the upright or semi upright radiograph there is more lucency over the liver than expected, potentially related artifact though of uncertain significance. LEFT lower lobe airspace disease in the retrocardiac region. Marked spinal degenerative  changes. IMPRESSION: 1. Increasing gastric distension. 2. Colonic distension with stool in the ascending colon. 3. Lucency over the liver on the upright or upright radiograph, potentially related artifact though cannot exclude pneumoperitoneum. CT may be helpful for further assessment 4. LEFT lower lobe airspace disease. These results were called by telephone at the time of interpretation on 06/07/2020 at 1:54 pm to  provider Hosie Poisson , who verbally acknowledged these results. Electronically Signed   By: Zetta Bills M.D.   On: 06/07/2020 13:55   DG Abd Portable 1V  Result Date: 05/26/2020 CLINICAL DATA:  Evaluate stool burden EXAM: PORTABLE ABDOMEN - 1 VIEW COMPARISON:  05/21/2020 FINDINGS: Contrast material is noted within the colon but to a lesser degree than that seen on the prior exam. Mild retained fecal material is noted. No obstructive changes are seen. Stomach is distended with air. IMPRESSION: Mild retained fecal material.  No obstructive changes are noted. Electronically Signed   By: Inez Catalina M.D.   On: 05/26/2020 15:26   DG Abd Portable 1V-Small Bowel Obstruction Protocol-initial, 8 hr delay  Result Date: 05/21/2020 CLINICAL DATA:  69 year old female small-bowel obstruction protocol, 8 hours post contrast. EXAM: PORTABLE ABDOMEN - 1 VIEW COMPARISON:  05/20/2020 and earlier. FINDINGS: AP view at 0232 hours. Stable enteric tube in the left upper quadrant. Administered oral contrast is predominantly in large bowel, from the cecum to the descending colon. Gas-filled bowel loop now projecting over the mid lower abdomen is probably redundant sigmoid. Visible small bowel loops appear within normal limits. IMPRESSION: Administered oral contrast is primarily throughout the large intestine. No convincing small bowel obstruction. Electronically Signed   By: Genevie Ann M.D.   On: 05/21/2020 04:38   DG Abd Portable 1V  Result Date: 05/20/2020 CLINICAL DATA:  Abdominal distension several days. EXAM: PORTABLE ABDOMEN - 1 VIEW COMPARISON:  05/19/2020 FINDINGS: Interval improvement and bowel gas pattern with no significantly dilated small bowel loops on the current exam. No free peritoneal air. Air is present throughout the colon. Mild fecal retention over the rectum. Nasogastric tube has tip over the stomach in the left upper quadrant. Remainder of the exam is unchanged. IMPRESSION: Interval improvement with no  significant dilated small bowel loops on the current exam. Nasogastric tube with tip over the stomach in the left upper quadrant. Electronically Signed   By: Marin Olp M.D.   On: 05/20/2020 10:27   DG Abd Portable 1V  Result Date: 05/19/2020 CLINICAL DATA:  Diarrhea. EXAM: PORTABLE ABDOMEN - 1 VIEW COMPARISON:  None. FINDINGS: Stomach is nondistended. There are multiple dilated loops of small bowel identified within the left abdomen measuring up to 3.1 cm. A large stool burden is noted involving the right colon. IMPRESSION: 1. Dilated loops of small bowel within the left abdomen compatible which may reflect partial small bowel obstruction or small-bowel ileus. 2. Large stool burden within the right colon. Electronically Signed   By: Kerby Moors M.D.   On: 05/19/2020 11:41   VAS [place] LOWER EXTREMITY VENOUS (DVT)  Result Date: 06/08/2020  Lower Venous DVTStudy Indications: Edema.  Limitations: Patient cooperation. Comparison Study: 06/01/20 Performing Technologist: Antonieta Pert RDMS, RVT  Examination Guidelines: A complete evaluation includes B-mode imaging, spectral Doppler, color Doppler, and power Doppler as needed of all accessible portions of each vessel. Bilateral testing is considered an integral part of a complete examination. Limited examinations for reoccurring indications may be performed as noted. The reflux portion of the exam is performed with the patient in reverse  Trendelenburg.  +---------+---------------+---------+-----------+-----------+------------------+ RIGHT    CompressibilityPhasicitySpontaneityProperties Thrombus Aging     +---------+---------------+---------+-----------+-----------+------------------+ CFV      Partial        Yes      Yes        brightly   Age Indeterminate                                              echogenic                     +---------+---------------+---------+-----------+-----------+------------------+ SFJ      Full                                                              +---------+---------------+---------+-----------+-----------+------------------+ FV Prox  Full                                                             +---------+---------------+---------+-----------+-----------+------------------+ FV Mid   Full                                                             +---------+---------------+---------+-----------+-----------+------------------+ FV DistalFull                                                             +---------+---------------+---------+-----------+-----------+------------------+ PFV      None                               softly                                                                    echogenic                     +---------+---------------+---------+-----------+-----------+------------------+ POP                     Yes      Yes                   unable to compress +---------+---------------+---------+-----------+-----------+------------------+ PTV      Full                                                             +---------+---------------+---------+-----------+-----------+------------------+  PERO     Full                                                             +---------+---------------+---------+-----------+-----------+------------------+ GSV      Full                                                             +---------+---------------+---------+-----------+-----------+------------------+ Non-occlusive, age indeterminate thrombus seen in the right distal CFV extending from the [facility].  +---------+---------------+---------+-----------+----------+------------------+ LEFT     CompressibilityPhasicitySpontaneityPropertiesThrombus Aging     +---------+---------------+---------+-----------+----------+------------------+ CFV      Full           Yes      Yes                                      +---------+---------------+---------+-----------+----------+------------------+ SFJ      Full                                                            +---------+---------------+---------+-----------+----------+------------------+ FV Prox  Full                                                            +---------+---------------+---------+-----------+----------+------------------+ FV Mid   Full                                                            +---------+---------------+---------+-----------+----------+------------------+ FV DistalFull                                                            +---------+---------------+---------+-----------+----------+------------------+ PFV      Full                                                            +---------+---------------+---------+-----------+----------+------------------+ POP                     Yes      Yes                  unable to compress +---------+---------------+---------+-----------+----------+------------------+ PTV  Full                                                            +---------+---------------+---------+-----------+----------+------------------+ PERO     Full                                                            +---------+---------------+---------+-----------+----------+------------------+ GSV      Full                                                            +---------+---------------+---------+-----------+----------+------------------+    Summary: RIGHT: - Findings consistent with age indeterminate deep vein thrombosis involving the right common femoral vein, and right proximal profunda vein. - No cystic structure found in the popliteal fossa.  LEFT: - There is no evidence of deep vein thrombosis in the lower extremity. However, portions of this examination were limited- see technologist comments above.  - No cystic structure found in the popliteal fossa.   *See table(s) above for measurements and observations. Electronically signed by Deitra Mayo MD on 06/08/2020 at 10:57:28 PM.    Final    VAS [place] LOWER EXTREMITY VENOUS (DVT)  Result Date: 06/01/2020  Lower Venous DVTStudy Indications: Edema.  Limitations: Patient could not tolerate compressions. Patient was combative, threatening, non-compliant. Comparison Study: No prior studies. Performing Technologist: Darlin Coco  Examination Guidelines: A complete evaluation includes B-mode imaging, spectral Doppler, color Doppler, and power Doppler as needed of all accessible portions of each vessel. Bilateral testing is considered an integral part of a complete examination. Limited examinations for reoccurring indications may be performed as noted. The reflux portion of the exam is performed with the patient in reverse Trendelenburg.  +---------+---------------+---------+-----------+---------------+--------------+ RIGHT    CompressibilityPhasicitySpontaneityProperties     Thrombus Aging +---------+---------------+---------+-----------+---------------+--------------+ CFV      Full           Yes      Yes                                      +---------+---------------+---------+-----------+---------------+--------------+ SFJ      Full                                                             +---------+---------------+---------+-----------+---------------+--------------+ FV Prox                          Yes        Patent by color               +---------+---------------+---------+-----------+---------------+--------------+ FV Mid  Yes        Patent by color               +---------+---------------+---------+-----------+---------------+--------------+ FV Distal                        Yes        Patent by color               +---------+---------------+---------+-----------+---------------+--------------+ PFV                              Yes         Patent by color               +---------+---------------+---------+-----------+---------------+--------------+ PTV                              Yes        Patent by color               +---------+---------------+---------+-----------+---------------+--------------+ PERO                             Yes        Patent by color               +---------+---------------+---------+-----------+---------------+--------------+   +---------+---------------+---------+-----------+---------------+--------------+ LEFT     CompressibilityPhasicitySpontaneityProperties     Thrombus Aging +---------+---------------+---------+-----------+---------------+--------------+ CFV                              Yes        Patent by color               +---------+---------------+---------+-----------+---------------+--------------+ SFJ                              Yes        Patent by color               +---------+---------------+---------+-----------+---------------+--------------+ FV Prox                          Yes        Patent by color               +---------+---------------+---------+-----------+---------------+--------------+ FV Mid                           Yes        Patent by color               +---------+---------------+---------+-----------+---------------+--------------+ FV Distal                        Yes        Patent by color               +---------+---------------+---------+-----------+---------------+--------------+ PFV                                                        Not visualized +---------+---------------+---------+-----------+---------------+--------------+ POP  Not visualized +---------+---------------+---------+-----------+---------------+--------------+ PTV                              Yes        Patent by color                +---------+---------------+---------+-----------+---------------+--------------+ PERO                             Yes        Patent by color               +---------+---------------+---------+-----------+---------------+--------------+     Summary: RIGHT: - There is no evidence of deep vein thrombosis in the lower extremity. However, portions of this examination were limited- see technologist comments above.  LEFT: - There is no evidence of deep vein thrombosis in the lower extremity. However, portions of this examination were limited- see technologist comments above.  *See table(s) above for measurements and observations. Electronically signed by Ruta Hinds MD on 06/01/2020 at 11:31:17 AM.    Final    [place] EKG SITE RITE  Result Date: 06/12/2020 If Site Rite image not attached, placement could not be confirmed due to current cardiac rhythm.  IR LUMBAR DISC ASPIRATION W/IMG GUIDE  Result Date: 06/14/2020 INDICATION: 69 year old female currently admitted with ischemic colitis. 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 She is currently on flagyl and ciprofloxacin. She comes to our service for a fluoroscopy guided disc aspiration. EXAM: FLUOROSCOPY GUIDED L3-4 DISC ASPIRATION MEDICATIONS: The patient is currently admitted to the hospital and receiving intravenous antibiotics. The antibiotics were administered within an appropriate time frame prior to the initiation of the procedure. ANESTHESIA/SEDATION: Fentanyl 50 mcg IV; Versed 1.5 mg IV Moderate Sedation Time:  23 The patient was continuously monitored during the procedure by the interventional radiology nurse under my direct supervision. COMPLICATIONS: None immediate. PROCEDURE: Informed written consent was obtained from the patient's healthcare proxy after a thorough discussion of the procedural risks, benefits and alternatives. All questions were addressed. Maximal Sterile Barrier Technique was utilized  including caps, mask, sterile gowns, sterile gloves, sterile drape, hand hygiene and skin antiseptic. A timeout was performed prior to the initiation of the procedure. Patient was made to lie prone on the angiography table. The lumbar region was prepped and draped in the usual sterile fashion. Under fluoroscopy, the L3-4 disc space was identified. Approximately 4 cm to the right of midline, an 18 gauge x 20 cm trocar was advanced into the disc space under fluoroscopy guidance. The meningioma was removed and a 22 gauge x 20 cm needle was advanced into the disc. Multiple fine needle aspiration samples were obtained. The trocar was subsequently removed. The skin was cleaned and and a sterile bandage was placed over the access site. The patient tolerated the procedure well without incident or complication. Samples collected were sent to the laboratory for microbiology analysis. IMPRESSION: Successful and uncomplicated fluoroscopy guided L3-4 disc aspiration. Memory Electronically Signed   By: Pedro Earls M.D.   On: 06/14/2020 09:01       Subjective: Patient seen and examined at bedside.  Denies any overnight fever, vomiting, worsening shortness of breath.  Poor historian.  Discharge Exam: Vitals:   06/16/20 2100 06/17/20 0615  BP: 121/67 122/88  Pulse: 97 71  Resp: 17 17  Temp: 98.7 F (37.1 C) 98.6 F (37 C)  SpO2:  99% 98%    General: Pt is awake, poor historian.  Looks chronically ill. Cardiovascular: rate controlled, S1/S2 + Respiratory: bilateral decreased breath sounds at bases Abdominal: Soft, NT, ND, bowel sounds + Extremities: Trace lower extremity edema bilaterally; no cyanosis    The results of significant diagnostics from this hospitalization (including imaging, microbiology, ancillary and laboratory) are listed below for reference.     Microbiology: Recent Results (from the past 240 hour(s))  Aerobic/Anaerobic Culture (surgical/deep wound)     Status: None    Collection Time: 06/11/20 10:42 AM   Specimen: Fine Needle Aspirate  Result Value Ref Range Status   Specimen Description NEEDLE ASPIRATE  Final   Special Requests INERVERTEBRAL DISC L3 L4 ASPIRATE  Final   Gram Stain NO WBC SEEN NO ORGANISMS SEEN   Final   Culture   Final    No growth aerobically or anaerobically. Performed at [facility], [place] [address] [place], [place] [address]    Report Status 06/16/2020 FINAL  Final  Culture, fungus without smear     Status: None (Preliminary result)   Collection Time: 06/11/20 10:42 AM   Specimen: Fine Needle Aspirate  Result Value Ref Range Status   Specimen Description NEEDLE ASPIRATE  Final   Special Requests INTERVERTEBRAL DISC L3 L4 ASPIRATE  Final   Culture   Final    NO FUNGUS ISOLATED AFTER 5 DAYS Performed at [facility], [address] [place], [place] [address]    Report Status PENDING  Incomplete  Fungus Stain     Status: None (Preliminary result)   Collection Time: 06/11/20 10:42 AM  Result Value Ref Range Status   FUNGUS STAIN Comment  Final    Comment: (NOTE) KOH/Calcofluor preparation:  no fungus observed. Performed At: [facility] [place], [place] [id number] Rush Farmer MD [id number]    Fungal Source PENDING  Incomplete     Labs: BNP (last 3 results) No results for input(s): BNP in the last 8760 hours. Basic Metabolic Panel: Recent Labs  Lab 06/11/20 1113 06/12/20 1656 06/13/20 0209 06/17/20 0415  NA 134* 132* 133* 131*  K 3.8 4.2 4.2 4.0  CL 102 103 105 96*  CO2 25 21* 21* 26  GLUCOSE 109* 107* 98 119*  BUN '8 10 14 9  '$ CREATININE 0.47 0.44 0.45 0.51  CALCIUM 8.6* 8.4* 8.5* 8.9   Liver Function Tests: No results for input(s): AST, ALT, ALKPHOS, BILITOT, PROT, ALBUMIN in the last 168 hours. No results for input(s): LIPASE, AMYLASE in the last 168 hours. No results for input(s): AMMONIA in the last 168 hours. CBC: Recent Labs  Lab 06/11/20 1113  06/12/20 1656 06/13/20 0209  WBC 5.0 5.4 5.2  NEUTROABS 3.5 3.2 3.3  HGB 7.9* 10.0* 9.1*  HCT 24.9* 32.0* 29.1*  MCV 99.2 100.9* 101.4*  PLT 421* 399 375   Cardiac Enzymes: No results for input(s): CKTOTAL, CKMB, CKMBINDEX, TROPONINI in the last 168 hours. BNP: Invalid input(s): POCBNP CBG: No results for input(s): GLUCAP in the last 168 hours. D-Dimer No results for input(s): DDIMER in the last 72 hours. Hgb A1c No results for input(s): HGBA1C in the last 72 hours. Lipid Profile No results for input(s): CHOL, HDL, LDLCALC, TRIG, CHOLHDL, LDLDIRECT in the last 72 hours. Thyroid function studies No results for input(s): TSH, T4TOTAL, T3FREE, THYROIDAB in the last 72 hours.  Invalid input(s): FREET3 Anemia work up No results for input(s): VITAMINB12, FOLATE, FERRITIN, TIBC, IRON, RETICCTPCT in the last  72 hours. Urinalysis    Component Value Date/Time   COLORURINE AMBER (A) 05/31/2020 1005   APPEARANCEUR HAZY (A) 05/31/2020 1005   LABSPEC 1.045 (H) 05/31/2020 1005   PHURINE 5.0 05/31/2020 1005   GLUCOSEU NEGATIVE 05/31/2020 1005   HGBUR MODERATE (A) 05/31/2020 1005   BILIRUBINUR NEGATIVE 05/31/2020 1005   KETONESUR NEGATIVE 05/31/2020 1005   PROTEINUR NEGATIVE 05/31/2020 1005   NITRITE NEGATIVE 05/31/2020 1005   LEUKOCYTESUR MODERATE (A) 05/31/2020 1005   Sepsis Labs Invalid input(s): PROCALCITONIN,  WBC,  LACTICIDVEN Microbiology Recent Results (from the past 240 hour(s))  Aerobic/Anaerobic Culture (surgical/deep wound)     Status: None   Collection Time: 06/11/20 10:42 AM   Specimen: Fine Needle Aspirate  Result Value Ref Range Status   Specimen Description NEEDLE ASPIRATE  Final   Special Requests INERVERTEBRAL DISC L3 L4 ASPIRATE  Final   Gram Stain NO WBC SEEN NO ORGANISMS SEEN   Final   Culture   Final    No growth aerobically or anaerobically. Performed at [facility], [place] [address] [place], [place] [address]    Report Status 06/16/2020 FINAL   Final  Culture, fungus without smear     Status: None (Preliminary result)   Collection Time: 06/11/20 10:42 AM   Specimen: Fine Needle Aspirate  Result Value Ref Range Status   Specimen Description NEEDLE ASPIRATE  Final   Special Requests INTERVERTEBRAL DISC L3 L4 ASPIRATE  Final   Culture   Final    NO FUNGUS ISOLATED AFTER 5 DAYS Performed at [facility], [address] [place], [place] [address]    Report Status PENDING  Incomplete  Fungus Stain     Status: None (Preliminary result)   Collection Time: 06/11/20 10:42 AM  Result Value Ref Range Status   FUNGUS STAIN Comment  Final    Comment: (NOTE) KOH/Calcofluor preparation:  no fungus observed. Performed At: [facility] [place], [place] [id number] Rush Farmer MD MV:[id number]    Fungal Source PENDING  Incomplete     Time coordinating discharge: 35 minutes  SIGNED:   Aline August, MD  [facility] 06/17/2020, 8:51 AM

## 2020-06-17 NOTE — Progress Notes (Addendum)
Spoke with April from Group Home. Informed her AVS is printed and will be sent with PTAR to be given to her along with printed RX. All questions answered at this time. Awaiting PTAR arrival scheduled for 1500.  1700 Patient transported via PTAR.

## 2020-06-17 NOTE — Care Management (Signed)
Spoke to patient at bedside regarding discharge today . Patient will receive today's doses of IV ABX at hospital prior to discharge. Bedside nurse will be ready for PTAR at 3pm today.   Spoke with April at [facility] and with [name] they are aware.  Spoke with Corrie Dandy at [facility], they are aware and have teaching arranged for tomorrow at group home.   Pam with [facility] aware also.   PTAR called and scheduled for 3pm.   Ronny Flurry RN

## 2020-06-17 NOTE — Progress Notes (Signed)
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  She was recently admitted with a small bowel obstruction and diarrhea, discharged on 6/24.  She continued to have diarrhea after discharge, poor p.o. intake and was readmitted on 6/27 with shock, confusion.     PT Comments    Pt in bed with her sister present upon arrival of PT, agreeable to session with encouragement from her sister. The pt continues to need significant assist to complete bed mobility and transfers, but was able to perform multiple exercises for BLE and core stability while sitting EOB to facilitate reduced need for assistance in transfers and static sitting in the future. The pt will continue to benefit from significant rehab to return to prior level of function, and demos significantly increased participation with encouragement from family.     Follow Up Recommendations  Home health PT;Supervision/Assistance - 24 hour     Equipment Recommendations  Other (comment) (pt has needed equipment and staff to assist at group home following d/c)    Recommendations for Other Services       Precautions / Restrictions Precautions Precautions: Fall Restrictions Weight Bearing Restrictions: No    Mobility  Bed Mobility Overal bed mobility: Needs Assistance Bed Mobility: Supine to Sit;Sit to Supine     Supine to sit: Total assist;+2 for physical assistance;HOB elevated Sit to supine: +2 for physical assistance;Total assist;HOB elevated   General bed mobility comments: total A +2 to roll and to perform supine <> sit. Used helicopter transfer. Pt initially c/o pain in low back upon sitting, maxA to support  Transfers Overall transfer level:  (not attempted due to pt pain and dependence with sup-sit)                      Balance Overall balance assessment: Needs assistance Sitting-balance support: Feet supported;Bilateral upper extremity supported;Single extremity supported Sitting balance-Leahy Scale: Zero Sitting balance - Comments: Did not heed to cues to sitting posture, mod-max A to maintain upright posture, Postural control: Posterior lean;Right lateral lean     Standing balance comment: pt unable                            Cognition Arousal/Alertness: Awake/alert Behavior During Therapy: Anxious Overall Cognitive Status: History of cognitive impairments - at baseline                                 General Comments: Pt asking to lay back through session, great encouragement by sister [name]. Pt reluctant to contineud exercises but agreeable with sisters encouragement      Exercises General Exercises - Lower Extremity Ankle Circles/Pumps: Supine;10 reps;Both;AAROM Long Arc Quad: Both;AAROM;10 reps;Seated Other Exercises Other Exercises: warm up ROM exercise to BUE and LE's Other Exercises: x5 reps of pt pulling to sit up straight with BUE, from maxA support to minA support when pt pulls up    General Comments General comments (skin integrity, edema, etc.): EOB x 10 min working on LE strengthening, balance, and posture      Pertinent Vitals/Pain Pain Assessment: Faces Faces Pain Scale: Hurts little more Pain Location: low back with any movement Pain Descriptors / Indicators: Grimacing;Sharp;Moaning;Guarding Pain Intervention(s): Limited activity within  patient's tolerance;Monitored during session;Repositioned           PT Goals (current goals can now be found in the care plan section) Acute Rehab PT Goals Patient Stated Goal: to be able to sit by herself PT Goal Formulation: With patient/family Time For Goal Achievement: 07/01/20 Potential to Achieve Goals: Fair Progress towards PT goals: Progressing toward goals     Frequency    Min 2X/week      PT Plan Discharge plan needs to be updated       AM-PAC PT "6 Clicks" Mobility   Outcome Measure  Help needed turning from your back to your side while in a flat bed without using bedrails?: Total Help needed moving from lying on your back to sitting on the side of a flat bed without using bedrails?: Total Help needed moving to and from a bed to a chair (including a wheelchair)?: Total Help needed standing up from a chair using your arms (e.g., wheelchair or bedside chair)?: Total Help needed to walk in hospital room?: Total Help needed climbing 3-5 steps with a railing? : Total 6 Click Score: 6    End of Session Equipment Utilized During Treatment: Gait belt Activity Tolerance: Patient limited by fatigue Patient left: with call bell/phone within reach;with family/visitor present;in bed;Other (comment) (in chair position) Nurse Communication: Mobility status PT Visit Diagnosis: Other symptoms and signs involving the nervous system (R29.898);Other abnormalities of gait and mobility (R26.89)     Time: 6546-5035 PT Time Calculation (min) (ACUTE ONLY): 24 min  Charges:  $Therapeutic Exercise: 8-22 mins $Therapeutic Activity: 8-22 mins                     Rolm Baptise, PT, DPT   Acute Rehabilitation Department Pager #: [phone]   Gaetana Michaelis 06/17/2020, 2:07 PM

## 2020-06-22 ENCOUNTER — Other Ambulatory Visit (HOSPITAL_COMMUNITY)
Admission: RE | Admit: 2020-06-22 | Discharge: 2020-06-22 | Disposition: A | Discharge status: Home / Self Care | Payer: Medicare Other | Source: Ambulatory Visit | Attending: Family Medicine | Admitting: Family Medicine

## 2020-06-22 DIAGNOSIS — M4646 Discitis, unspecified, lumbar region: Secondary | ICD-10-CM | POA: Insufficient documentation

## 2020-06-22 DIAGNOSIS — Z5181 Encounter for therapeutic drug level monitoring: Secondary | ICD-10-CM | POA: Diagnosis present

## 2020-06-22 DIAGNOSIS — Z792 Long term (current) use of antibiotics: Secondary | ICD-10-CM | POA: Insufficient documentation

## 2020-06-22 LAB — CBC WITH DIFFERENTIAL/PLATELET
Abs Immature Granulocytes: 0.03 10*3/uL (ref 0.00–0.07)
Basophils Absolute: 0 10*3/uL (ref 0.0–0.1)
Basophils Relative: 0 %
Eosinophils Absolute: 0.1 10*3/uL (ref 0.0–0.5)
Eosinophils Relative: 3 %
HCT: 25.3 % — ABNORMAL LOW (ref 36.0–46.0)
Hemoglobin: 8.1 g/dL — ABNORMAL LOW (ref 12.0–15.0)
Immature Granulocytes: 1 %
Lymphocytes Relative: 26 %
Lymphs Abs: 1 10*3/uL (ref 0.7–4.0)
MCH: 32.4 pg (ref 26.0–34.0)
MCHC: 32 g/dL (ref 30.0–36.0)
MCV: 101.2 fL — ABNORMAL HIGH (ref 80.0–100.0)
Monocytes Absolute: 0.4 10*3/uL (ref 0.1–1.0)
Monocytes Relative: 11 %
Neutro Abs: 2.3 10*3/uL (ref 1.7–7.7)
Neutrophils Relative %: 59 %
Platelets: 485 10*3/uL — ABNORMAL HIGH (ref 150–400)
RBC: 2.5 MIL/uL — ABNORMAL LOW (ref 3.87–5.11)
RDW: 15.2 % (ref 11.5–15.5)
WBC: 3.8 10*3/uL — ABNORMAL LOW (ref 4.0–10.5)
nRBC: 0 % (ref 0.0–0.2)

## 2020-06-22 LAB — BASIC METABOLIC PANEL
Anion gap: 8 (ref 5–15)
BUN: 11 mg/dL (ref 8–23)
CO2: 28 mmol/L (ref 22–32)
Calcium: 8.7 mg/dL — ABNORMAL LOW (ref 8.9–10.3)
Chloride: 98 mmol/L (ref 98–111)
Creatinine, Ser: 0.34 mg/dL — ABNORMAL LOW (ref 0.44–1.00)
GFR calc Af Amer: >60 mL/min (ref >60)
GFR calc non Af Amer: >60 mL/min (ref >60)
Glucose, Bld: 89 mg/dL (ref 70–99)
Potassium: 3.4 mmol/L — ABNORMAL LOW (ref 3.5–5.1)
Sodium: 134 mmol/L — ABNORMAL LOW (ref 135–145)

## 2020-06-22 LAB — VANCOMYCIN, TROUGH: Vancomycin Tr: 13 ug/mL — ABNORMAL LOW (ref 15–20)

## 2020-06-22 LAB — C-REACTIVE PROTEIN: CRP: 12.3 mg/dL — ABNORMAL HIGH (ref <1.0)

## 2020-06-22 LAB — SEDIMENTATION RATE: Sed Rate: 85 mm/hr — ABNORMAL HIGH (ref 0–22)

## 2020-06-23 ENCOUNTER — Encounter: Payer: Self-pay | Admitting: Neurology

## 2020-06-23 ENCOUNTER — Ambulatory Visit: Payer: Medicare Other | Admitting: Neurology

## 2020-06-23 ENCOUNTER — Telehealth: Payer: Self-pay | Admitting: Nurse Practitioner

## 2020-06-23 ENCOUNTER — Telehealth: Payer: Self-pay

## 2020-06-23 NOTE — Telephone Encounter (Signed)
Pt no showed 06/23/2020 follow up with Dr. Frances Furbish.

## 2020-06-23 NOTE — Telephone Encounter (Signed)
Spoke with patient's sister, Channing Mutters (Legal Guardian) and rec'd verbal consent from her to begin Palliative services at the Group Home.  Sister requested that I contact Director of the Group Home to schedule visit.  Called and spoke with April Evans (Director of Group Home) and have scheduled an In-person Consult for 06/24/20 @ 10:30 AM.

## 2020-06-24 ENCOUNTER — Other Ambulatory Visit (HOSPITAL_COMMUNITY)
Admission: RE | Admit: 2020-06-24 | Discharge: 2020-06-24 | Disposition: A | Discharge status: Home / Self Care | Payer: Medicare Other | Source: Skilled Nursing Facility | Attending: Family Medicine | Admitting: Family Medicine

## 2020-06-24 ENCOUNTER — Encounter: Payer: Self-pay | Admitting: Nurse Practitioner

## 2020-06-24 ENCOUNTER — Other Ambulatory Visit: Payer: Medicare Other | Admitting: Nurse Practitioner

## 2020-06-24 ENCOUNTER — Other Ambulatory Visit: Payer: Self-pay

## 2020-06-24 DIAGNOSIS — Z5181 Encounter for therapeutic drug level monitoring: Secondary | ICD-10-CM | POA: Diagnosis not present

## 2020-06-24 DIAGNOSIS — Z792 Long term (current) use of antibiotics: Secondary | ICD-10-CM | POA: Diagnosis not present

## 2020-06-24 DIAGNOSIS — M4646 Discitis, unspecified, lumbar region: Secondary | ICD-10-CM | POA: Insufficient documentation

## 2020-06-24 DIAGNOSIS — Z515 Encounter for palliative care: Secondary | ICD-10-CM

## 2020-06-24 LAB — BASIC METABOLIC PANEL
Anion gap: 10 (ref 5–15)
BUN: 13 mg/dL (ref 8–23)
CO2: 26 mmol/L (ref 22–32)
Calcium: 8.5 mg/dL — ABNORMAL LOW (ref 8.9–10.3)
Chloride: 99 mmol/L (ref 98–111)
Creatinine, Ser: 0.34 mg/dL — ABNORMAL LOW (ref 0.44–1.00)
GFR calc Af Amer: >60 mL/min (ref >60)
GFR calc non Af Amer: >60 mL/min (ref >60)
Glucose, Bld: 116 mg/dL — ABNORMAL HIGH (ref 70–99)
Potassium: 3 mmol/L — ABNORMAL LOW (ref 3.5–5.1)
Sodium: 135 mmol/L (ref 135–145)

## 2020-06-24 LAB — VANCOMYCIN, TROUGH: Vancomycin Tr: 11 ug/mL — ABNORMAL LOW (ref 15–20)

## 2020-06-24 NOTE — Progress Notes (Addendum)
[facility] Palliative Care Consult Note Telephone: [phone]  Fax: [phone]  PATIENT NAME: Nicole [name] DOB: [birth year] MRN: [id number]  PRIMARY CARE PROVIDER:   Vicenta Aly, [name]  REFERRING PROVIDER:  Vicenta Aly, [place] [place],  [place] [address]  RESPONSIBLE PARTY:   Sister Debbra Riding [phone]; April Evans Director of group home [phone]  I was asked by Vicenta Aly [name] to see Ms. Velazco for Palliative care consult for goals of care  RECOMMENDATIONS and PLAN:  1. ACP: DNR; placed in Vynca   2. Palliative care encounter; Palliative medicine team will continue to support patient, patient's family, and medical team. Visit consisted of counseling and education dealing with the complex and emotionally intense issues of symptom management and palliative care in the setting of serious and potentially life-threatening illness  I spent 90 minutes providing this consultation,  from 10:00am to 11:30am. More than 50% of the time in this consultation was spent coordinating communication.   HISTORY OF PRESENT ILLNESS:  Nicole [name] is a 69 y.o. 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 Hospitalized 6 / 14/2021 to 6 / 24 / 2021 for diarrhea non-infectious partial small bowel obstruction versus illeus with acute metabolic encephalopathy. Hospitalized 05/30/2020 to 7 15/2021 for persistent diarrhea and generalized weakness. Workup significant for sepsis with ischemic colitis started on cholestyramine. Thrombus right lower lobe lung and lower  extremity indeterminate DVT involving right common femoral vein and right proximal profunda vein. Patient was refusing to eat, lab draws, palliative care consulted. Acute blood loss likely in the setting of chronic normocytic anemia. 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 Ms. Mortellaro sister Golda Acre is Health Care power of attorney. Wishes are for DNR, most form completed during hospitalization.Lumbar discitis without knowing bacteremia, persistent intermittent diarrhea alternating with severe constipation with segmented colitis thought to be a ischemic colitis by GI but with some clinical uncertainty and new diagnosis DVT with PE.   Palliative care in person visit. I met [name], Ms Burruel sister prior to entering the group home. [name] and I talked separately. We talked about purpose of palliative care visit. We talked about anoxic brain injury Ms. Hiebert sustained at birth. We talked about chronic disease progression. We talked about past medical history. We talked about her functional-level prior to hospitalization where she was able to stand and take a few steps with a walker. We talked about recent hospitalization. [name] endorses she did feel a lot of frustration when she was not aware of Ms Kovatch refusing labs in events that did happen during customization. [name] endorses that she was very grateful for Dr Hilma Favors Palliative Care, support and information. We talked about Ms Coltrane current functional abilities were she does require to be hired to the chair. Ms. Brammer is working with physical and occupational therapy in addition to nursing. They were able to draw labs this morning. We talked about medical goals of care. MOST form in place and DNR but not in epic/vynca. Asked if it was okay to place an  epic/vynca. [name] in  agreement. We talked about Ms. Monette personality. We talked about Ms. Wixon residing in facility and currently Ms Magnussen has been at this current group home for about a year. [name] endorses she previously was under care of another facility though when increase in skill level happened that she required more hands-on assistance with ambulating they asked her to move. We talked about role Palliative Care and plan of care. Went in and introduce myself to Ms. Sesma, we talked about purpose of palliative care visit. Ms. Castello endorses that she wanted to see [name] first. I met with [name], Group Home owner. We revisited discussion that had was simple. We talked about the last year Ms Foxworthy has resided at her facility. We talked about the functional abilities prior to hospitalization and now currently. [name] endorses Ms. Shaker is total care, requires turning in positioning, for your left to the recliner. We talked about Ms. Rathbone ability to feed herself. [name] enforses seems like when Ms. Storlie has a pill in front of her she has to be prompted to pick it up and put it in her mouth as well as using utensils to feed herself. Appetite has been improving. Continued to give her IV antibiotic therapy with PICC line in upper right arm. [name] endorses has been going well. We talked about incontinence. [name] endorses sometimes Ms Deziel will let her know she needs to use the bathroom but most of the time she does not say anything and then she is wet or soiled. [name] endorses prior to hospitalization she was able to let her know that she needed to use the bathroom but since it seems to be a disconnect of Ms. Parrilla not realizing she has urinated or defacated. [name] endorses that she does have some forms tool but does continue to have some mixed in diarrhea. We talked about their daily routine. Ms. Amalia Hailey talked about Ms. Andujar love show Heartline on Netflix TV. [name] joined discussion and talked more about  [name] joined discussion and talked more about concerned for the cognitive disconnect. We talked about chronic disease progression. We talked about visiting with Ms. Ratterree and proceeded to do so. I visited and observe Ms Eckstrom. Explain purpose of palliative care. Ms. Hollenkamp smiled and said "okay". Ms. Petron was limited with her verbal responses and often needed to be prompted by Golda Acre or [name]. Ms. Naab did talk about hospitalization briefly most words were one or two answers. Ms. Talsma was cooperative with assessment. We talked about getting up in the chair, feeding herself. We talked about the work she has been doing with therapy. We talked about the importance of working with the therapist to try to get a little stronger. We talked about feeding herself and appetite. We talked about show Heartline on Netflix series that Ms Prigmore has been watching. We talked about the importance of getting out of bed. We talked about the importance of positive praise. This copy was cooperative with the assessment. Limited in-depth discussion with challenges cognition. Ms. Kiser was able to recall family members, birthdays. Ms. Stengel is sharp cognitively at times but the recognition sensation of bowel and bladder or triggering her to lift her arms has to be prompted. Emotional support provided. Ms. Petrich endorses it was okay to have another palliative care visit. After visit with Ms Ulice Brilliant Ms Clemencia Course and I met again separately. We talked about medical goals care. We talked about continuing therapy  until completed. [name] asked about supplies. Discuss that typically palliative care does not provide supplies that that would come from primary provider. [name] endorses that the primary provider said that the supplies will come from palliative care. Discuss with Ms. Evan's, [name] that will contact primary provider and explain that palliative care does not provide supplies though Hospice does. We did talk about hospice  under Medicare benefit and eligibility. We talked about what services are provided. [name] endorses that her mom and dad both had Hospice Services at home so she was familiar. Discuss that will see how Ms. Oldenburg does when we have is done we can reevaluate to see if she functionally continues to declines, proceed case by [facility]. We talked about role of palliative care and plan of care. Discussed will have follow-up is it in 4 weeks if needed or sooner should she declined. [name] and [name] in agreement. Therapeutic listening  and emotional support provided. Contact information provided. Questions answered to satisfaction.  Palliative Care was asked to help address goals of care.   CODE STATUS: DNR  PPS: 30% HOSPICE ELIGIBILITY/DIAGNOSIS: TBD  PAST MEDICAL HISTORY:  Past Medical History:  Diagnosis Date  . 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 Allergic rhinitis   . Alopecia   . Discoid lupus   . Hyperlipidemia   . Hypertension   . Menopause   . Moderate mental retardation   . Obesity   . Osteoporosis   . Pulmonary nodules   . Seizures ([place])    focal-being by Dr. Erling Cruz (should not take anti-histamines)    SOCIAL HX:  Social History   Tobacco Use  . Smoking status: Never Smoker  . Smokeless tobacco: Never Used  Substance Use Topics  . Alcohol use: No    ALLERGIES:  Allergies  Allergen Reactions  . Seasonal Ic [Cholestatin]     Runny nose     PERTINENT MEDICATIONS:  Outpatient Encounter Medications as of 06/24/2020  Medication Sig  . Amino Acids-Protein Hydrolys (FEEDING SUPPLEMENT, PRO-STAT SUGAR FREE 64,) LIQD Take 30 mLs by mouth 3 (three) times daily.  Marland Kitchen atorvastatin (LIPITOR) 10 MG tablet Take 10 mg by mouth every Monday, Wednesday, and Friday.   . budesonide (PULMICORT FLEXHALER) 180 MCG/ACT inhaler Inhale 1 puff into the lungs 2 (two) times daily as needed.  . busPIRone (BUSPAR) 15 MG tablet Take 15 mg by mouth 2 (two) times daily.    . calcium  carbonate (OS-CAL) 600 MG TABS tablet Take 600 mg by mouth 3 (three) times daily with meals.   . carbamazepine (TEGRETOL XR) 100 MG 12 hr tablet Take 3 tablets (300 mg total) by mouth 2 (two) times daily. Reducing due to low sodium levels recently. (Patient taking differently: Take 300 mg by mouth 2 (two) times daily. )  . cefTRIAXone (ROCEPHIN) IVPB Inject 2 g into the vein daily. Indication:  discitis First Dose: Yes Last Day of Therapy:  07/25/2020 Labs - Once weekly:  CBC/D and BMP, Labs - Every other week:  ESR and CRP Method of administration: IV Push Method of administration may be changed at the discretion of home infusion pharmacist based upon assessment of the patient and/or caregiver's ability to self-administer the medication ordered.  . Cholecalciferol (VITAMIN D3) 2000 units TABS Take 2,000 Units by mouth daily.   . cholestyramine (QUESTRAN) 4 g packet Take 1 packet (4 g total) by mouth 3 (three) times daily.  Marland Kitchen enoxaparin (LOVENOX) 80 MG/0.8ML injection  Inject 0.8 mLs (80 mg total) into the skin every 12 (twelve) hours.  . gabapentin (NEURONTIN) 300 MG capsule Take 2 capsules (600 mg total) by mouth at bedtime.  . levETIRAcetam (KEPPRA) 500 MG tablet Take 1 tablet (500 mg total) by mouth 2 (two) times daily.  . Multiple Vitamins-Minerals (CERTAVITE/ANTIOXIDANTS) TABS Take 1 tablet by mouth daily. Advance , daily   . pantoprazole (PROTONIX) 40 MG tablet Take 40 mg by mouth daily.   Marland Kitchen saccharomyces boulardii (FLORASTOR) 250 MG capsule Take 1 capsule (250 mg total) by mouth 2 (two) times daily.  . Selenium 200 MCG TBCR Take 1 tablet by mouth daily.  . vancomycin IVPB Inject 1,000 mg into the vein every 12 (twelve) hours. Indication:  discitis First Dose: Yes Last Day of Therapy:  07/25/2020 Labs - Sunday/Monday:  CBC/D, BMP, and vancomycin trough. Labs - Thursday:  BMP and vancomycin trough Labs - Every other week:  ESR and CRP Method of administration:Elastomeric Method of  administration may be changed at the discretion of the patient and/or caregiver's ability to self-administer the medication ordered.  . Wheat Dextrin (BENEFIBER) CHEW Chew 1 tablet by mouth daily.    No facility-administered encounter medications on file as of 06/24/2020.    PHYSICAL EXAM:   General: NAD, frail appearing, debilitated, pleasant female Cardiovascular: regular rate and rhythm Pulmonary: clear ant fields Neurological: functional quadriplegic  Ercilia Bettinger Ihor Gully, NP

## 2020-06-28 ENCOUNTER — Telehealth: Payer: Self-pay

## 2020-06-28 ENCOUNTER — Other Ambulatory Visit (HOSPITAL_COMMUNITY)
Admission: RE | Admit: 2020-06-28 | Discharge: 2020-06-28 | Disposition: A | Discharge status: Home / Self Care | Payer: Medicare Other | Source: Ambulatory Visit | Attending: Family Medicine | Admitting: Family Medicine

## 2020-06-28 ENCOUNTER — Telehealth: Payer: Self-pay | Admitting: Nurse Practitioner

## 2020-06-28 DIAGNOSIS — Z5181 Encounter for therapeutic drug level monitoring: Secondary | ICD-10-CM | POA: Insufficient documentation

## 2020-06-28 DIAGNOSIS — Z792 Long term (current) use of antibiotics: Secondary | ICD-10-CM | POA: Diagnosis present

## 2020-06-28 DIAGNOSIS — M4646 Discitis, unspecified, lumbar region: Secondary | ICD-10-CM | POA: Diagnosis present

## 2020-06-28 LAB — BASIC METABOLIC PANEL
Anion gap: 10 (ref 5–15)
BUN: 21 mg/dL (ref 8–23)
CO2: 27 mmol/L (ref 22–32)
Calcium: 9.3 mg/dL (ref 8.9–10.3)
Chloride: 100 mmol/L (ref 98–111)
Creatinine, Ser: 0.5 mg/dL (ref 0.44–1.00)
GFR calc Af Amer: >60 mL/min (ref >60)
GFR calc non Af Amer: >60 mL/min (ref >60)
Glucose, Bld: 138 mg/dL — ABNORMAL HIGH (ref 70–99)
Potassium: 3.6 mmol/L (ref 3.5–5.1)
Sodium: 137 mmol/L (ref 135–145)

## 2020-06-28 LAB — CBC WITH DIFFERENTIAL/PLATELET
Abs Immature Granulocytes: 0.06 10*3/uL (ref 0.00–0.07)
Basophils Absolute: 0 10*3/uL (ref 0.0–0.1)
Basophils Relative: 0 %
Eosinophils Absolute: 0.1 10*3/uL (ref 0.0–0.5)
Eosinophils Relative: 1 %
HCT: 28.5 % — ABNORMAL LOW (ref 36.0–46.0)
Hemoglobin: 8.9 g/dL — ABNORMAL LOW (ref 12.0–15.0)
Immature Granulocytes: 1 %
Lymphocytes Relative: 10 %
Lymphs Abs: 0.9 10*3/uL (ref 0.7–4.0)
MCH: 32.1 pg (ref 26.0–34.0)
MCHC: 31.2 g/dL (ref 30.0–36.0)
MCV: 102.9 fL — ABNORMAL HIGH (ref 80.0–100.0)
Monocytes Absolute: 0.8 10*3/uL (ref 0.1–1.0)
Monocytes Relative: 9 %
Neutro Abs: 7.1 10*3/uL (ref 1.7–7.7)
Neutrophils Relative %: 79 %
Platelets: 440 10*3/uL — ABNORMAL HIGH (ref 150–400)
RBC: 2.77 MIL/uL — ABNORMAL LOW (ref 3.87–5.11)
RDW: 15.7 % — ABNORMAL HIGH (ref 11.5–15.5)
WBC: 9 10*3/uL (ref 4.0–10.5)
nRBC: 0 % (ref 0.0–0.2)

## 2020-06-28 LAB — VANCOMYCIN, TROUGH: Vancomycin Tr: 21 ug/mL (ref 15–20)

## 2020-06-28 NOTE — Telephone Encounter (Signed)
[name], Ms. Marullo sister Health Care power of attorney called with questions concerning visit. We talked about coronary artery diagnosis on problem list which was listed from the hospitalization on 7/17 2021. We talked about medical supplies that will come from primary provider Ms Dareen Piano [name], already notified the office. Questions answered. Emotional support provided. Contact information. Will continue with follow-up palliative care visit  Total time spent 20 minutes  Documentation 5 minutes  Phone discussion 15 minutes

## 2020-06-28 NOTE — Telephone Encounter (Signed)
Received call today for critical lab results for patient's vanc trough. RN states trough is 21. Patient has not been seen in clinic yet, advised RN to contact Md on call. Lorenso Courier, [place]

## 2020-07-01 ENCOUNTER — Other Ambulatory Visit (HOSPITAL_COMMUNITY)
Admission: RE | Admit: 2020-07-01 | Discharge: 2020-07-01 | Disposition: A | Discharge status: Home / Self Care | Payer: Medicare Other | Source: Skilled Nursing Facility | Attending: Family Medicine | Admitting: Family Medicine

## 2020-07-01 DIAGNOSIS — M4646 Discitis, unspecified, lumbar region: Secondary | ICD-10-CM | POA: Diagnosis present

## 2020-07-01 DIAGNOSIS — Z5181 Encounter for therapeutic drug level monitoring: Secondary | ICD-10-CM | POA: Insufficient documentation

## 2020-07-01 DIAGNOSIS — Z792 Long term (current) use of antibiotics: Secondary | ICD-10-CM | POA: Diagnosis present

## 2020-07-01 LAB — BASIC METABOLIC PANEL
Anion gap: 10 (ref 5–15)
BUN: 17 mg/dL (ref 8–23)
CO2: 29 mmol/L (ref 22–32)
Calcium: 9.8 mg/dL (ref 8.9–10.3)
Chloride: 97 mmol/L — ABNORMAL LOW (ref 98–111)
Creatinine, Ser: 0.7 mg/dL (ref 0.44–1.00)
GFR calc Af Amer: >60 mL/min (ref >60)
GFR calc non Af Amer: >60 mL/min (ref >60)
Glucose, Bld: 109 mg/dL — ABNORMAL HIGH (ref 70–99)
Potassium: 3.8 mmol/L (ref 3.5–5.1)
Sodium: 136 mmol/L (ref 135–145)

## 2020-07-01 LAB — VANCOMYCIN, TROUGH: Vancomycin Tr: 23 ug/mL (ref 15–20)

## 2020-07-02 LAB — CULTURE, FUNGUS WITHOUT SMEAR

## 2020-07-05 ENCOUNTER — Encounter (HOSPITAL_COMMUNITY)
Admission: RE | Admit: 2020-07-05 | Discharge: 2020-07-05 | Disposition: A | Discharge status: Home / Self Care | Payer: Medicare Other | Source: Ambulatory Visit | Attending: Family Medicine | Admitting: Family Medicine

## 2020-07-05 DIAGNOSIS — Z792 Long term (current) use of antibiotics: Secondary | ICD-10-CM | POA: Insufficient documentation

## 2020-07-05 DIAGNOSIS — Z5181 Encounter for therapeutic drug level monitoring: Secondary | ICD-10-CM | POA: Insufficient documentation

## 2020-07-05 DIAGNOSIS — M4640 Discitis, unspecified, site unspecified: Secondary | ICD-10-CM | POA: Insufficient documentation

## 2020-07-05 LAB — CBC WITH DIFFERENTIAL/PLATELET
Abs Immature Granulocytes: 0.04 10*3/uL (ref 0.00–0.07)
Basophils Absolute: 0 10*3/uL (ref 0.0–0.1)
Basophils Relative: 0 %
Eosinophils Absolute: 0.1 10*3/uL (ref 0.0–0.5)
Eosinophils Relative: 1 %
HCT: 28.4 % — ABNORMAL LOW (ref 36.0–46.0)
Hemoglobin: 9 g/dL — ABNORMAL LOW (ref 12.0–15.0)
Immature Granulocytes: 1 %
Lymphocytes Relative: 11 %
Lymphs Abs: 0.8 10*3/uL (ref 0.7–4.0)
MCH: 31.5 pg (ref 26.0–34.0)
MCHC: 31.7 g/dL (ref 30.0–36.0)
MCV: 99.3 fL (ref 80.0–100.0)
Monocytes Absolute: 0.5 10*3/uL (ref 0.1–1.0)
Monocytes Relative: 7 %
Neutro Abs: 5.8 10*3/uL (ref 1.7–7.7)
Neutrophils Relative %: 80 %
Platelets: 550 10*3/uL — ABNORMAL HIGH (ref 150–400)
RBC: 2.86 MIL/uL — ABNORMAL LOW (ref 3.87–5.11)
RDW: 14.8 % (ref 11.5–15.5)
WBC: 7.2 10*3/uL (ref 4.0–10.5)
nRBC: 0 % (ref 0.0–0.2)

## 2020-07-05 LAB — BASIC METABOLIC PANEL
Anion gap: 11 (ref 5–15)
BUN: 13 mg/dL (ref 8–23)
CO2: 30 mmol/L (ref 22–32)
Calcium: 11.1 mg/dL — ABNORMAL HIGH (ref 8.9–10.3)
Chloride: 95 mmol/L — ABNORMAL LOW (ref 98–111)
Creatinine, Ser: 0.64 mg/dL (ref 0.44–1.00)
GFR calc Af Amer: >60 mL/min (ref >60)
GFR calc non Af Amer: >60 mL/min (ref >60)
Glucose, Bld: 129 mg/dL — ABNORMAL HIGH (ref 70–99)
Potassium: 4.4 mmol/L (ref 3.5–5.1)
Sodium: 136 mmol/L (ref 135–145)

## 2020-07-05 LAB — VANCOMYCIN, TROUGH: Vancomycin Tr: 8 ug/mL — ABNORMAL LOW (ref 15–20)

## 2020-07-05 LAB — C-REACTIVE PROTEIN: CRP: 7.6 mg/dL — ABNORMAL HIGH (ref <1.0)

## 2020-07-05 LAB — SEDIMENTATION RATE: Sed Rate: 131 mm/hr — ABNORMAL HIGH (ref 0–22)

## 2020-07-08 ENCOUNTER — Other Ambulatory Visit (HOSPITAL_COMMUNITY)
Admission: RE | Admit: 2020-07-08 | Discharge: 2020-07-08 | Disposition: A | Discharge status: Home / Self Care | Payer: Medicare Other | Source: Other Acute Inpatient Hospital | Attending: Family Medicine | Admitting: Family Medicine

## 2020-07-08 DIAGNOSIS — M4646 Discitis, unspecified, lumbar region: Secondary | ICD-10-CM | POA: Diagnosis present

## 2020-07-08 LAB — BASIC METABOLIC PANEL
Anion gap: 11 (ref 5–15)
BUN: 20 mg/dL (ref 8–23)
CO2: 30 mmol/L (ref 22–32)
Calcium: 12 mg/dL — ABNORMAL HIGH (ref 8.9–10.3)
Chloride: 97 mmol/L — ABNORMAL LOW (ref 98–111)
Creatinine, Ser: 0.67 mg/dL (ref 0.44–1.00)
GFR calc Af Amer: >60 mL/min (ref >60)
GFR calc non Af Amer: >60 mL/min (ref >60)
Glucose, Bld: 144 mg/dL — ABNORMAL HIGH (ref 70–99)
Potassium: 4.2 mmol/L (ref 3.5–5.1)
Sodium: 138 mmol/L (ref 135–145)

## 2020-07-08 LAB — VANCOMYCIN, TROUGH: Vancomycin Tr: 10 ug/mL — ABNORMAL LOW (ref 15–20)

## 2020-07-12 ENCOUNTER — Other Ambulatory Visit (HOSPITAL_COMMUNITY)
Admission: RE | Admit: 2020-07-12 | Discharge: 2020-07-12 | Disposition: A | Discharge status: Home / Self Care | Payer: Medicare Other | Source: Ambulatory Visit | Attending: Family Medicine | Admitting: Family Medicine

## 2020-07-12 DIAGNOSIS — M4646 Discitis, unspecified, lumbar region: Secondary | ICD-10-CM | POA: Diagnosis present

## 2020-07-12 LAB — BASIC METABOLIC PANEL
Anion gap: 9 (ref 5–15)
BUN: 26 mg/dL — ABNORMAL HIGH (ref 8–23)
CO2: 28 mmol/L (ref 22–32)
Calcium: 10.9 mg/dL — ABNORMAL HIGH (ref 8.9–10.3)
Chloride: 100 mmol/L (ref 98–111)
Creatinine, Ser: 0.59 mg/dL (ref 0.44–1.00)
GFR calc Af Amer: >60 mL/min (ref >60)
GFR calc non Af Amer: >60 mL/min (ref >60)
Glucose, Bld: 132 mg/dL — ABNORMAL HIGH (ref 70–99)
Potassium: 4.1 mmol/L (ref 3.5–5.1)
Sodium: 137 mmol/L (ref 135–145)

## 2020-07-12 LAB — CBC WITH DIFFERENTIAL/PLATELET
Abs Immature Granulocytes: 0.03 10*3/uL (ref 0.00–0.07)
Basophils Absolute: 0 10*3/uL (ref 0.0–0.1)
Basophils Relative: 1 %
Eosinophils Absolute: 0.2 10*3/uL (ref 0.0–0.5)
Eosinophils Relative: 3 %
HCT: 26.4 % — ABNORMAL LOW (ref 36.0–46.0)
Hemoglobin: 8.2 g/dL — ABNORMAL LOW (ref 12.0–15.0)
Immature Granulocytes: 1 %
Lymphocytes Relative: 16 %
Lymphs Abs: 0.9 10*3/uL (ref 0.7–4.0)
MCH: 31.8 pg (ref 26.0–34.0)
MCHC: 31.1 g/dL (ref 30.0–36.0)
MCV: 102.3 fL — ABNORMAL HIGH (ref 80.0–100.0)
Monocytes Absolute: 0.8 10*3/uL (ref 0.1–1.0)
Monocytes Relative: 14 %
Neutro Abs: 3.8 10*3/uL (ref 1.7–7.7)
Neutrophils Relative %: 65 %
Platelets: 490 10*3/uL — ABNORMAL HIGH (ref 150–400)
RBC: 2.58 MIL/uL — ABNORMAL LOW (ref 3.87–5.11)
RDW: 14.8 % (ref 11.5–15.5)
WBC: 5.8 10*3/uL (ref 4.0–10.5)
nRBC: 0 % (ref 0.0–0.2)

## 2020-07-12 LAB — VANCOMYCIN, TROUGH: Vancomycin Tr: 13 ug/mL — ABNORMAL LOW (ref 15–20)

## 2020-07-13 ENCOUNTER — Telehealth (INDEPENDENT_AMBULATORY_CARE_PROVIDER_SITE_OTHER): Payer: Medicare Other | Admitting: Family

## 2020-07-13 ENCOUNTER — Encounter: Payer: Self-pay | Admitting: Family

## 2020-07-13 ENCOUNTER — Other Ambulatory Visit: Payer: Self-pay

## 2020-07-13 DIAGNOSIS — M4646 Discitis, unspecified, lumbar region: Secondary | ICD-10-CM

## 2020-07-14 NOTE — Progress Notes (Signed)
Subjective:    Patient ID: Nicole [name], female    DOB: Mar 02, 1951, 69 y.o.   MRN: [id number]  Chief Complaint  Patient presents with   Follow-up     Virtual Visit via Telephone/Video Note   I connected with  Ms. Milinda Coffeyon 07/14/2020 at 1605 by video visit and verified that I am speaking with the correct person using two identifiers.  Her nurse was also present for the visit   I discussed the limitations, risks, security and privacy concerns of performing an evaluation and management service by telephone and the availability of in person appointments. I also discussed with the patient that there may be a patient responsible charge related to this service. The patient expressed understanding and agreed to proceed.  Location:  Ms.. Silberstein is located at her skilled nursing facility in [facility] and I am located at the [facility] for infectious disease in [place].   HPI:  Nicole [name] is a 69 y.o. 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  Ms. Sons had been previously hospitalized from 6/14-6/24 with noninfectious diarrhea and small bowel obstruction and concern for ileus.  C. difficile and GI pathogen panel were both negative.  She continued to have diarrhea over 3-weeks and was once again tested for C. difficile and GI pathogen panel which were both negative.  Sigmoidoscopy showed ischemic colitis.  CT abdomen with concern for abdominal distention and possible bowel obstruction and a possible thrombus in the right lower lung pulmonary artery and branches.  There were findings concerning for possible discitis with follow-up MRI showing enhancement along L3/L4 disc margins concerning for  osteomyelitis/discitis.  Underwent aspiration with no organisms seen on Gram stain and cultures with no growth aerobically or anaerobically.  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  End date planned for 8/22.   Ms. Billingham has been receiving her vancomycin and ceftriaxone as prescribed with no adverse side effects or missed doses.  PICC line remains functional without evidence of infection.  Overall feeling better since leaving the hospital.  She is conversive today compared to during the hospitalization.  Denies fevers, chills, or sweats.  Does continue to have pain on occasion.   Allergies  Allergen Reactions   Seasonal Ic [Cholestatin]     Runny nose      Outpatient Medications Prior to Visit  Medication Sig Dispense Refill   Amino Acids-Protein Hydrolys (FEEDING SUPPLEMENT, PRO-STAT SUGAR FREE 64,) LIQD Take 30 mLs by mouth 3 (three) times daily. 887 mL 0   atorvastatin (LIPITOR) 10 MG tablet Take 10 mg by mouth every Monday, Wednesday, and Friday.      budesonide (PULMICORT FLEXHALER) 180 MCG/ACT inhaler Inhale 1 puff into the lungs 2 (two) times daily as needed. 1 each 0   busPIRone (BUSPAR) 15 MG tablet Take 15 mg by mouth 2 (two) times daily.       calcium carbonate (OS-CAL) 600 MG TABS tablet Take 600 mg by mouth 3 (three) times daily with meals.      carbamazepine (TEGRETOL XR) 100 MG 12 hr tablet Take 3 tablets (300 mg total) by mouth 2 (two) times daily. Reducing due to low sodium levels recently. (Patient taking differently: Take 300 mg by mouth 2 (two) times daily. ) 180  tablet 5   cefTRIAXone (ROCEPHIN) IVPB Inject 2 g into the vein daily. Indication:  discitis First Dose: Yes Last Day of Therapy:  07/25/2020 Labs - Once weekly:  CBC/D and BMP, Labs - Every other week:  ESR and CRP Method of administration: IV Push Method of administration may be changed at the discretion of home infusion pharmacist based  upon assessment of the patient and/or caregiver's ability to self-administer the medication ordered. 42 Units 0   Cholecalciferol (VITAMIN D3) 2000 units TABS Take 2,000 Units by mouth daily.      cholestyramine (QUESTRAN) 4 g packet Take 1 packet (4 g total) by mouth 3 (three) times daily. 90 packet 0   enoxaparin (LOVENOX) 80 MG/0.8ML injection Inject 0.8 mLs (80 mg total) into the skin every 12 (twelve) hours. 144 mL 0   gabapentin (NEURONTIN) 300 MG capsule Take 2 capsules (600 mg total) by mouth at bedtime. 60 capsule 0   levETIRAcetam (KEPPRA) 500 MG tablet Take 1 tablet (500 mg total) by mouth 2 (two) times daily. 60 tablet 5   Multiple Vitamins-Minerals (CERTAVITE/ANTIOXIDANTS) TABS Take 1 tablet by mouth daily. Advance , daily      pantoprazole (PROTONIX) 40 MG tablet Take 40 mg by mouth daily.      saccharomyces boulardii (FLORASTOR) 250 MG capsule Take 1 capsule (250 mg total) by mouth 2 (two) times daily. 60 capsule 0   Selenium 200 MCG TBCR Take 1 tablet by mouth daily.     vancomycin IVPB Inject 1,000 mg into the vein every 12 (twelve) hours. Indication:  discitis First Dose: Yes Last Day of Therapy:  07/25/2020 Labs - Sunday/Monday:  CBC/D, BMP, and vancomycin trough. Labs - Thursday:  BMP and vancomycin trough Labs - Every other week:  ESR and CRP Method of administration:Elastomeric Method of administration may be changed at the discretion of the patient and/or caregiver's ability to self-administer the medication ordered. 84 Units 0   Wheat Dextrin (BENEFIBER) CHEW Chew 1 tablet by mouth daily.      No facility-administered medications prior to visit.     Past Medical History:  Diagnosis Date   Abnormal WBC count    low chronic WBC (2-3K)   Allergic rhinitis    Alopecia    Discoid lupus    Hyperlipidemia    Hypertension    Menopause    Moderate mental retardation    Obesity    Osteoporosis    Pulmonary nodules    Seizures ([place])     focal-being by Dr. Erling Cruz (should not take anti-histamines)     Past Surgical History:  Procedure Laterality Date   BIOPSY  06/04/2020   Procedure: BIOPSY;  Surgeon: Arta Silence, MD;  Location: [facility];  Service: Endoscopy;;   FLEXIBLE SIGMOIDOSCOPY N/A 06/04/2020   Procedure: Beryle Quant;  Surgeon: Arta Silence, MD;  Location: [facility] END[facility]COPY;  Service: Endoscopy;  Laterality: N/A;   IR LUMBAR DISC ASPIRATION W/IMG GUIDE  06/11/2020   VESICOVAGINAL FISTULA CLOSURE W/ TAH         Review of Systems  Constitutional: Negative for chills, diaphoresis, fatigue and fever.  Respiratory: Negative for cough, chest tightness, shortness of breath and wheezing.   Cardiovascular: Negative for chest pain.  Gastrointestinal: Negative for abdominal pain, diarrhea, nausea and vomiting.      Objective:    Nursing note and vital signs reviewed.  Ms. Plocher is pleasant to speak with and appears to be doing well.  Physical exam limited secondary to telemedicine visit.  PICC line appears with dressing that is clean and dry and without evidence of infection.    Assessment & Plan:   Problem List Items Addressed This Visit      Musculoskeletal and Integument   Discitis    Ms. App is a 69 year old female with culture-negative discitis found on CT abdomen and confirmed with follow-up MRI and treated with 6 weeks of IV vancomycin and ceftriaxone through PICC line in the lower right arm.  Appears to be tolerating treatment well.  Inflammatory markers remain elevated and has 2 weeks remaining in treatment.  Continue current dose of vancomycin and ceftriaxone.  Therapeutic drug monitoring of renal function and vancomycin levels.  Plan for follow-up in 3 weeks or sooner if needed.          I am having Angela Nevin. [place] maintain her Benefiber, busPIRone, Selenium, calcium carbonate, pantoprazole, CertaVite/Antioxidants, Vitamin D3, carbamazepine, levETIRAcetam, atorvastatin, cefTRIAXone,  vancomycin, gabapentin, cholestyramine, feeding supplement (PRO-STAT SUGAR FREE 64), Pulmicort Flexhaler, enoxaparin, and saccharomyces boulardii.   No orders of the defined types were placed in this encounter.    I discussed the assessment and treatment plan with the patient. The patient was provided an opportunity to ask questions and all were answered. The patient agreed with the plan and demonstrated an understanding of the instructions.   The patient was advised to call back or seek an in-person evaluation if the symptoms worsen or if the condition fails to improve as anticipated.   I provided 16  minutes of non-face-to-face time during this encounter.  Follow-up: 3 weeks  Terri Piedra, MSN, [name]-C Nurse Practitioner [facility] for [place] number: [phone]

## 2020-07-14 NOTE — Assessment & Plan Note (Signed)
Nicole [name] is a 69 year old female with culture-negative discitis found on CT abdomen and confirmed with follow-up MRI and treated with 6 weeks of IV vancomycin and ceftriaxone through PICC line in the lower right arm.  Appears to be tolerating treatment well.  Inflammatory markers remain elevated and has 2 weeks remaining in treatment.  Continue current dose of vancomycin and ceftriaxone.  Therapeutic drug monitoring of renal function and vancomycin levels.  Plan for follow-up in 3 weeks or sooner if needed.

## 2020-07-15 ENCOUNTER — Other Ambulatory Visit (HOSPITAL_COMMUNITY)
Admission: RE | Admit: 2020-07-15 | Discharge: 2020-07-15 | Disposition: A | Discharge status: Home / Self Care | Payer: Medicare Other | Source: Ambulatory Visit | Attending: Family Medicine | Admitting: Family Medicine

## 2020-07-15 DIAGNOSIS — Z792 Long term (current) use of antibiotics: Secondary | ICD-10-CM | POA: Insufficient documentation

## 2020-07-15 DIAGNOSIS — Z5181 Encounter for therapeutic drug level monitoring: Secondary | ICD-10-CM | POA: Diagnosis present

## 2020-07-15 DIAGNOSIS — M4848XS Fatigue fracture of vertebra, sacral and sacrococcygeal region, sequela of fracture: Secondary | ICD-10-CM | POA: Insufficient documentation

## 2020-07-15 LAB — BASIC METABOLIC PANEL
Anion gap: 8 (ref 5–15)
BUN: 17 mg/dL (ref 8–23)
CO2: 29 mmol/L (ref 22–32)
Calcium: 10.2 mg/dL (ref 8.9–10.3)
Chloride: 99 mmol/L (ref 98–111)
Creatinine, Ser: 0.62 mg/dL (ref 0.44–1.00)
GFR calc Af Amer: >60 mL/min (ref >60)
GFR calc non Af Amer: >60 mL/min (ref >60)
Glucose, Bld: 140 mg/dL — ABNORMAL HIGH (ref 70–99)
Potassium: 3.8 mmol/L (ref 3.5–5.1)
Sodium: 136 mmol/L (ref 135–145)

## 2020-07-15 LAB — VANCOMYCIN, TROUGH: Vancomycin Tr: 15 ug/mL (ref 15–20)

## 2020-07-19 ENCOUNTER — Other Ambulatory Visit (HOSPITAL_COMMUNITY)
Admission: RE | Admit: 2020-07-19 | Discharge: 2020-07-19 | Disposition: A | Discharge status: Home / Self Care | Payer: Medicare Other | Source: Ambulatory Visit | Attending: Family Medicine | Admitting: Family Medicine

## 2020-07-19 DIAGNOSIS — M4646 Discitis, unspecified, lumbar region: Secondary | ICD-10-CM | POA: Diagnosis present

## 2020-07-19 LAB — BASIC METABOLIC PANEL
Anion gap: 9 (ref 5–15)
BUN: 14 mg/dL (ref 8–23)
CO2: 26 mmol/L (ref 22–32)
Calcium: 10.6 mg/dL — ABNORMAL HIGH (ref 8.9–10.3)
Chloride: 101 mmol/L (ref 98–111)
Creatinine, Ser: 0.68 mg/dL (ref 0.44–1.00)
GFR calc Af Amer: >60 mL/min (ref >60)
GFR calc non Af Amer: >60 mL/min (ref >60)
Glucose, Bld: 170 mg/dL — ABNORMAL HIGH (ref 70–99)
Potassium: 4.5 mmol/L (ref 3.5–5.1)
Sodium: 136 mmol/L (ref 135–145)

## 2020-07-19 LAB — CBC WITH DIFFERENTIAL/PLATELET
Abs Immature Granulocytes: 0.04 10*3/uL (ref 0.00–0.07)
Basophils Absolute: 0 10*3/uL (ref 0.0–0.1)
Basophils Relative: 0 %
Eosinophils Absolute: 0.1 10*3/uL (ref 0.0–0.5)
Eosinophils Relative: 2 %
HCT: 27.9 % — ABNORMAL LOW (ref 36.0–46.0)
Hemoglobin: 8.4 g/dL — ABNORMAL LOW (ref 12.0–15.0)
Immature Granulocytes: 1 %
Lymphocytes Relative: 16 %
Lymphs Abs: 0.9 10*3/uL (ref 0.7–4.0)
MCH: 31.5 pg (ref 26.0–34.0)
MCHC: 30.1 g/dL (ref 30.0–36.0)
MCV: 104.5 fL — ABNORMAL HIGH (ref 80.0–100.0)
Monocytes Absolute: 0.3 10*3/uL (ref 0.1–1.0)
Monocytes Relative: 6 %
Neutro Abs: 4.3 10*3/uL (ref 1.7–7.7)
Neutrophils Relative %: 75 %
Platelets: 375 10*3/uL (ref 150–400)
RBC: 2.67 MIL/uL — ABNORMAL LOW (ref 3.87–5.11)
RDW: 15.1 % (ref 11.5–15.5)
WBC: 5.6 10*3/uL (ref 4.0–10.5)
nRBC: 0 % (ref 0.0–0.2)

## 2020-07-19 LAB — C-REACTIVE PROTEIN: CRP: 1 mg/dL — ABNORMAL HIGH (ref <1.0)

## 2020-07-19 LAB — VANCOMYCIN, TROUGH: Vancomycin Tr: 13 ug/mL — ABNORMAL LOW (ref 15–20)

## 2020-07-19 LAB — SEDIMENTATION RATE: Sed Rate: 95 mm/hr — ABNORMAL HIGH (ref 0–22)

## 2020-07-21 ENCOUNTER — Encounter: Payer: Self-pay | Admitting: Nurse Practitioner

## 2020-07-21 ENCOUNTER — Other Ambulatory Visit: Payer: Self-pay

## 2020-07-21 ENCOUNTER — Other Ambulatory Visit: Payer: Medicare Other | Admitting: Nurse Practitioner

## 2020-07-21 DIAGNOSIS — Z515 Encounter for palliative care: Secondary | ICD-10-CM

## 2020-07-21 NOTE — Progress Notes (Signed)
[employer] Palliative Care Consult Note Telephone: [phone]  Fax: [phone]  PATIENT NAME: Nicole [name] DOB: 04/22/1951 MRN: [id number]  PRIMARY CARE PROVIDER:   Vicenta Aly, [name]  REFERRING PROVIDER:  Vicenta Aly, [address] [place],  [place] [address]  RESPONSIBLE PARTY:   Sister Debbra Riding [phone]; April Evans Director of group home [phone]  RECOMMENDATIONS and PLAN: 1.ACP: DNR; in [place]  2.Palliative care encounter; Palliative medicine team will continue to support patient, patient's family, and medical team. Visit consisted of counseling and education dealing with the complex and emotionally intense issues of symptom management and palliative care in the setting of serious and potentially life-threatening illness  3. F/u 1 month for ongoing monitoring for decline, symptoms, appetite  I spent 60 minutes providing this consultation,  from 9:30am to 10:30am. More than 50% of the time in this consultation was spent coordinating communication.   HISTORY OF PRESENT ILLNESS:  Nicole [name] is a 69 y.o. year old female with multiple medical problems including CVA with left hemiparesis, Pulmonary nodules, coronary artery disease, pulmonary embolism, right leg DVT, aortic arthro sclerosis, seizure disorder, hypertension, hyperlipidemia, discoid lupus, anemia, alopecia, allergic rhinitis, menopause, anoxic brain injury at Birth with moderate mental retardation, osteoporosis, obesity, vesticovaginal fissure was closure with tah.In-person follow-up palliative care visit with [name], owner of group home [name] and sister [name] health care power-of-attorney. We talked about purpose of palliative visit. We talked about how [name] has been doing. [name] is currently sitting in the recliner in the den area with her feet up. Asked [name] if she was having symptoms of pain and she replied no. [name] was  cooperative with assessment. We talked about her appetite with breakfast being her best meal. We talked about Ms. Onstad work with therapy and expectations for gaining more strength in her upper and lower extremities. Her lower extremities were described as noodles and we talked about the catastrophic events with multiple hospitalizations, sepsis and recovery time. We talked about medical goals of care including access two primary provider visits. We talked about the challenges getting Ms. Urizar to her appointment and that it does wear her out. We talked about the option of a house called provider and what that would look like. [name] and [name], Ms Cannady in agreement to transition to housecall provider and recommended Lauretta Grill NP. Contact information given and [name] school called to set up the appointment. We talked about this coffee is urine looking dark in color with sediment. We talked about the importance of increase in hydration, water. I talked with Miss coffee about importance of hydration. We talked about what brings her Joy improving her quality of life and that is a TV series called [place]. We talked about role of palliative care and plan of care. Discuss that will keep on monthly visits for now as Tier 1 due to monitoring debility, fragile state of hell as she currently continues to receive IV antibiotics for this site is through a PICC line and follow by infectious disease. The wound on her foot has healed. We talked about follow-up palliative care visit in 4 weeks, appointments scheduled. Therapeutic listening and emotional support provided. Contact information provided. Questions answered to satisfaction.  Palliative Care was asked to help to continue to address goals of care.   CODE STATUS: DNR PPS: 40% HOSPICE ELIGIBILITY/DIAGNOSIS: TBD  PAST MEDICAL HISTORY:  Past Medical History:  Diagnosis Date  .  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 Allergic rhinitis   .  Alopecia   . Discoid lupus   . Hyperlipidemia   . Hypertension   . Menopause   . Moderate mental retardation   . Obesity   . Osteoporosis   . Pulmonary nodules   . Seizures ([place])    focal-being by Dr. Erling Cruz (should not take anti-histamines)    SOCIAL HX:  Social History   Tobacco Use  . Smoking status: Never Smoker  . Smokeless tobacco: Never Used  Substance Use Topics  . Alcohol use: No    ALLERGIES:  Allergies  Allergen Reactions  . Seasonal Ic [Cholestatin]     Runny nose     PERTINENT MEDICATIONS:  Outpatient Encounter Medications as of 07/21/2020  Medication Sig  . Amino Acids-Protein Hydrolys (FEEDING SUPPLEMENT, PRO-STAT SUGAR FREE 64,) LIQD Take 30 mLs by mouth 3 (three) times daily.  Marland Kitchen atorvastatin (LIPITOR) 10 MG tablet Take 10 mg by mouth every Monday, Wednesday, and Friday.   . budesonide (PULMICORT FLEXHALER) 180 MCG/ACT inhaler Inhale 1 puff into the lungs 2 (two) times daily as needed.  . busPIRone (BUSPAR) 15 MG tablet Take 15 mg by mouth 2 (two) times daily.    . calcium carbonate (OS-CAL) 600 MG TABS tablet Take 600 mg by mouth 3 (three) times daily with meals.   . carbamazepine (TEGRETOL XR) 100 MG 12 hr tablet Take 3 tablets (300 mg total) by mouth 2 (two) times daily. Reducing due to low sodium levels recently. (Patient taking differently: Take 300 mg by mouth 2 (two) times daily. )  . cefTRIAXone (ROCEPHIN) IVPB Inject 2 g into the vein daily. Indication:  discitis First Dose: Yes Last Day of Therapy:  07/25/2020 Labs - Once weekly:  CBC/D and BMP, Labs - Every other week:  ESR and CRP Method of administration: IV Push Method of administration may be changed at the discretion of home infusion pharmacist based upon assessment of the patient and/or caregiver's ability to self-administer the medication ordered.  . Cholecalciferol (VITAMIN D3) 2000 units TABS Take 2,000 Units by mouth daily.   . cholestyramine (QUESTRAN) 4 g packet Take 1 packet (4 g total)  by mouth 3 (three) times daily.  Marland Kitchen enoxaparin (LOVENOX) 80 MG/0.8ML injection Inject 0.8 mLs (80 mg total) into the skin every 12 (twelve) hours.  . gabapentin (NEURONTIN) 300 MG capsule Take 2 capsules (600 mg total) by mouth at bedtime.  . levETIRAcetam (KEPPRA) 500 MG tablet Take 1 tablet (500 mg total) by mouth 2 (two) times daily.  . Multiple Vitamins-Minerals (CERTAVITE/ANTIOXIDANTS) TABS Take 1 tablet by mouth daily. Advance , daily   . pantoprazole (PROTONIX) 40 MG tablet Take 40 mg by mouth daily.   . Selenium 200 MCG TBCR Take 1 tablet by mouth daily.  . vancomycin IVPB Inject 1,000 mg into the vein every 12 (twelve) hours. Indication:  discitis First Dose: Yes Last Day of Therapy:  07/25/2020 Labs - Sunday/Monday:  CBC/D, BMP, and vancomycin trough. Labs - Thursday:  BMP and vancomycin trough Labs - Every other week:  ESR and CRP Method of administration:Elastomeric Method of administration may be changed at the discretion of the patient and/or caregiver's ability to self-administer the medication ordered.  . Wheat Dextrin (BENEFIBER) CHEW Chew 1 tablet by mouth daily.    No facility-administered encounter medications on file as of 07/21/2020.    PHYSICAL EXAM:   General: NAD, frail appearing, chronically ill female Cardiovascular:  regular rate and rhythm Pulmonary: clear ant fields Neurological: functionally quadriplegic  Pearse Shiffler Ihor Gully, NP

## 2020-07-22 ENCOUNTER — Other Ambulatory Visit (HOSPITAL_COMMUNITY)
Admission: RE | Admit: 2020-07-22 | Discharge: 2020-07-22 | Disposition: A | Discharge status: Home / Self Care | Payer: Medicare Other | Source: Ambulatory Visit | Attending: Family Medicine | Admitting: Family Medicine

## 2020-07-22 DIAGNOSIS — M4646 Discitis, unspecified, lumbar region: Secondary | ICD-10-CM | POA: Insufficient documentation

## 2020-07-22 DIAGNOSIS — Z792 Long term (current) use of antibiotics: Secondary | ICD-10-CM | POA: Insufficient documentation

## 2020-07-22 DIAGNOSIS — Z5181 Encounter for therapeutic drug level monitoring: Secondary | ICD-10-CM | POA: Insufficient documentation

## 2020-07-22 LAB — VANCOMYCIN, TROUGH: Vancomycin Tr: 17 ug/mL (ref 15–20)

## 2020-07-22 LAB — BASIC METABOLIC PANEL
Anion gap: 11 (ref 5–15)
BUN: 15 mg/dL (ref 8–23)
CO2: 25 mmol/L (ref 22–32)
Calcium: 10.5 mg/dL — ABNORMAL HIGH (ref 8.9–10.3)
Chloride: 101 mmol/L (ref 98–111)
Creatinine, Ser: 0.65 mg/dL (ref 0.44–1.00)
GFR calc Af Amer: >60 mL/min (ref >60)
GFR calc non Af Amer: >60 mL/min (ref >60)
Glucose, Bld: 127 mg/dL — ABNORMAL HIGH (ref 70–99)
Potassium: 4 mmol/L (ref 3.5–5.1)
Sodium: 137 mmol/L (ref 135–145)

## 2020-07-26 ENCOUNTER — Telehealth: Payer: Self-pay

## 2020-07-26 NOTE — Telephone Encounter (Signed)
RN called to report that she was unable to get blood return on patient's PICC and attempted 2x to get labs elsewhere. Reports that last dose of vancomycin was yesterday, 8/22. Wanted orders/direction on what provider would like to do.   Caesar Mannella Loyola Mast, RN

## 2020-07-27 NOTE — Telephone Encounter (Signed)
Home health RN called office to follow up on message from yesterday. Will call RN back once orders are back [phone] Lorenso Courier, CMA

## 2020-07-27 NOTE — Telephone Encounter (Signed)
Ok to pull PICC? 

## 2020-07-28 NOTE — Telephone Encounter (Signed)
Verbal orders relayed to [place], [facility] RN. Lorenso Courier, [place]

## 2020-08-20 ENCOUNTER — Other Ambulatory Visit: Payer: Medicare Other | Admitting: Nurse Practitioner

## 2020-08-20 ENCOUNTER — Other Ambulatory Visit: Payer: Self-pay

## 2020-08-20 ENCOUNTER — Encounter: Payer: Self-pay | Admitting: Nurse Practitioner

## 2020-08-20 DIAGNOSIS — I6930 Unspecified sequelae of cerebral infarction: Secondary | ICD-10-CM

## 2020-08-20 DIAGNOSIS — Z515 Encounter for palliative care: Secondary | ICD-10-CM

## 2020-08-20 NOTE — Progress Notes (Signed)
Therapist, nutritional Palliative Care Consult Note Telephone: [phone]  Fax: [phone]  PATIENT NAME: Nicole [name] DOB: February 04, 1951 MRN: [id number]  PRIMARY CARE PROVIDER:   Elizabeth Palau, [name]  REFERRING PROVIDER:  Elizabeth Palau, [name] [address] [place],  [place] [address] RESPONSIBLE PARTY:Nicole [name] [phone]; Nicole [name] Director of group home [phone]  RECOMMENDATIONS and PLAN: 1.ACP:DNR; in [facility]; transition to Hospice, elgible per Hospice Physicians, family agreeable. Order received from Anselm Jungling NP  2.Palliative care encounter; Palliative medicine team will continue to support patient, patient's family, and medical team. Visit consisted of counseling and education dealing with the complex and emotionally intense issues of symptom management and palliative care in the setting of serious and potentially life-threatening illness  I spent 90 minutes providing this consultation,  from 9:00am to 10:30am. More than 50% of the time in this consultation was spent coordinating communication.   HISTORY OF PRESENT ILLNESS:  Nicole [name] is a 69 y.o. 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 Follow up visit with Nicole. [name], Nicole Nicole [name] and caregiver Nicole [name] for Palliative Care. We talked about purpose of palliative care visit and Nicole [name] an agreement. We talked about how Nicole Henri has been doing. Nicole [name] endorses they have been having trouble with the urine wick for insurance to send refills, having to pay out-of-pocket. We talked about sing if  Anselm Jungling Nurse Practitioner can send an order to see if it would be covered by Medicare. we talked about overall decline in debility. Nicole [name] has been getting up into the recliner by hoyer but it has been some time now. Nicole. Philipp [name] has become more stiff and now is requiring to be turned in position. Nicole. [name] does continue to have a wound on her sacrum. Nicole. [name] does continue to drink ensure but she does choke and cough when she take sips of liquids. Nicole. [name] is drinking through a straw. Nicole [name] endorses that our appetite has been declined and only a few bites at meals the breakfast is her best meal. Nicole [name] endorses she has just become more stiff she was moving her right arm more but now there is no motivation to do that. Nicole [name] endorses that she feels like this property is extremely tired overall. Nicole. [name] does continue to watch her shows for what she does enjoy. Nicole. Nicole [name] endorses Nicole. [name] has been more irritable over the last couple weeks and worsening. Nicole [name] endorses there was an episode of syncope last week where EMS came out to check her. They felt it was either a seizure or dehydration. Her blood pressure was low at the time. They went ahead and try to continue to oral fluids. Nicole [name] did a little bit better but was not transported to the emergency department. We talked about medical goals of care including aggressive versus comfort care. We talked about the option of Hospice Services under Medicare benefit. Nicole [name] endorses that she is familiar with hospice says she had house is for her mother and her stepfather. We talked about ongoing decline, changes. We talked about her functional changes of becoming more staff and not getting out of bed, not using her arms as  she was. We talked about the challenges with eating as it does require prompting an encouraging reminding Nicole [name], not using her arms as she was. We talked about the challenges with eating as it does require prompting an  encouraging reminding Nicole [name] to chew and swallow. We talked about wound, nutrition and overall declined with progression towards the end of life. Nicole [name] and Nicole [name] endorses they would like the case reviewed by the Hospice Physicians for eligibility. Nicole [name] endorses she will contact Anselm Jungling Nurse Practitioner for orders for the urine wick and dressing supplies, equipment. We talked about Hospice Physicians determine eligibility will let us know either proceed with Hospice or schedule follow-up palliative care visit. Nicole. [name] and I talked about how she was feeling today. Nicole [name] endorses she is currently comfortable, not hungry. Nicole. [name] talked about her aunt who called her yesterday. Limited verbal discussion with mild cognitive impairment. Nicole. [name] was cooperative with assessment. Nicole. [name] talked to Nicole [name] and Nicole [name] during palliative care visit also. Therapeutic listening and emotional support provided. Contact information. Questions answered is satisfaction.  Palliative Care was asked to help to continue to address goals of care.   CODE STATUS: DNR  PPS: 30% HOSPICE ELIGIBILITY/DIAGNOSIS: TBD  PAST MEDICAL HISTORY:  Past Medical History:  Diagnosis Date  . 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 Allergic rhinitis   . Alopecia   . Discoid lupus   . Hyperlipidemia   . Hypertension   . Menopause   . Moderate mental retardation   . Obesity   . Osteoporosis   . Pulmonary nodules   . Seizures (HCC)    focal-being by Dr. Sandria Manly (should not take anti-histamines)    SOCIAL HX:  Social History   Tobacco Use  . Smoking status: Never Smoker  . Smokeless tobacco: Never Used  Substance Use Topics  . Alcohol use: No    ALLERGIES:  Allergies  Allergen Reactions  . Seasonal Ic [Cholestatin]     Runny nose     PERTINENT MEDICATIONS:  Outpatient Encounter Medications as of 08/20/2020  Medication Sig  . Amino Acids-Protein Hydrolys (FEEDING SUPPLEMENT, PRO-STAT  SUGAR FREE 64,) LIQD Take 30 mLs by mouth 3 (three) times daily.  Marland Kitchen atorvastatin (LIPITOR) 10 MG tablet Take 10 mg by mouth every Monday, Wednesday, and Friday.   . budesonide (PULMICORT FLEXHALER) 180 MCG/ACT inhaler Inhale 1 puff into the lungs 2 (two) times daily as needed.  . busPIRone (BUSPAR) 15 MG tablet Take 15 mg by mouth 2 (two) times daily.    . calcium carbonate (OS-CAL) 600 MG TABS tablet Take 600 mg by mouth 3 (three) times daily with meals.   . carbamazepine (TEGRETOL XR) 100 MG 12 hr tablet Take 3 tablets (300 mg total) by mouth 2 (two) times daily. Reducing due to low sodium levels recently. (Patient taking differently: Take 300 mg by mouth 2 (two) times daily. )  . Cholecalciferol (VITAMIN D3) 2000 units TABS Take 2,000 Units by mouth daily.   . cholestyramine (QUESTRAN) 4 g packet Take 1 packet (4 g total) by mouth 3 (three) times daily.  Marland Kitchen enoxaparin (LOVENOX) 80 MG/0.8ML injection Inject 0.8 mLs (80 mg total) into the skin every 12 (twelve) hours.  . gabapentin (NEURONTIN) 300 MG capsule Take 2 capsules (600 mg total) by mouth at bedtime.  . levETIRAcetam (KEPPRA) 500 MG tablet Take 1 tablet (500 mg total) by mouth 2 (two) times daily.  . Multiple  Vitamins-Minerals (CERTAVITE/ANTIOXIDANTS) TABS Take 1 tablet by mouth daily. Advance , daily   . pantoprazole (PROTONIX) 40 MG tablet Take 40 mg by mouth daily.   . Selenium 200 MCG TBCR Take 1 tablet by mouth daily.  . Wheat Dextrin (BENEFIBER) CHEW Chew 1 tablet by mouth daily.    No facility-administered encounter medications on file as of 08/20/2020.    PHYSICAL EXAM:   General: chronically ill, debilitated pleasant female Cardiovascular: regular rate and rhythm Pulmonary: clear ant fields Neurological: functionally quadriplegic  Philbert Ocallaghan [place], NP

## 2020-09-07 ENCOUNTER — Encounter (HOSPITAL_BASED_OUTPATIENT_CLINIC_OR_DEPARTMENT_OTHER): Payer: Medicare Other | Admitting: Internal Medicine

## 2020-12-29 ENCOUNTER — Ambulatory Visit: Payer: Medicare Other | Admitting: Adult Health

## 2021-01-03 ENCOUNTER — Telehealth: Payer: Medicare Other | Admitting: Adult Health

## 2021-03-31 ENCOUNTER — Other Ambulatory Visit: Payer: Self-pay | Admitting: Student

## 2021-03-31 DIAGNOSIS — L89154 Pressure ulcer of sacral region, stage 4: Secondary | ICD-10-CM

## 2021-04-16 ENCOUNTER — Other Ambulatory Visit: Payer: Medicare Other

## 2021-04-25 ENCOUNTER — Ambulatory Visit
Admission: RE | Admit: 2021-04-25 | Discharge: 2021-04-25 | Disposition: A | Discharge status: Home / Self Care | Payer: Medicare Other | Source: Ambulatory Visit | Attending: Student | Admitting: Student

## 2021-04-25 ENCOUNTER — Other Ambulatory Visit: Payer: Self-pay

## 2021-04-25 DIAGNOSIS — L89154 Pressure ulcer of sacral region, stage 4: Secondary | ICD-10-CM

## 2021-04-25 MED ORDER — GADOBENATE DIMEGLUMINE 529 MG/ML IV SOLN
17.0000 mL | Freq: Once | INTRAVENOUS | Status: AC | PRN
  Administered 2021-04-25: 17 mL via INTRAVENOUS

## 2021-05-18 ENCOUNTER — Telehealth: Payer: Self-pay

## 2021-05-18 ENCOUNTER — Encounter: Payer: Self-pay | Admitting: Infectious Disease

## 2021-05-18 ENCOUNTER — Other Ambulatory Visit: Payer: Self-pay

## 2021-05-18 ENCOUNTER — Other Ambulatory Visit (HOSPITAL_COMMUNITY): Payer: Self-pay

## 2021-05-18 ENCOUNTER — Other Ambulatory Visit (HOSPITAL_COMMUNITY): Payer: Self-pay | Admitting: *Deleted

## 2021-05-18 ENCOUNTER — Ambulatory Visit (INDEPENDENT_AMBULATORY_CARE_PROVIDER_SITE_OTHER): Payer: Medicare Other | Admitting: Infectious Disease

## 2021-05-18 VITALS — BP 123/77 | HR 81

## 2021-05-18 DIAGNOSIS — Z22322 Carrier or suspected carrier of Methicillin resistant Staphylococcus aureus: Secondary | ICD-10-CM | POA: Diagnosis not present

## 2021-05-18 DIAGNOSIS — I6930 Unspecified sequelae of cerebral infarction: Secondary | ICD-10-CM | POA: Diagnosis not present

## 2021-05-18 DIAGNOSIS — M4646 Discitis, unspecified, lumbar region: Secondary | ICD-10-CM

## 2021-05-18 DIAGNOSIS — Z8669 Personal history of other diseases of the nervous system and sense organs: Secondary | ICD-10-CM

## 2021-05-18 DIAGNOSIS — M4628 Osteomyelitis of vertebra, sacral and sacrococcygeal region: Secondary | ICD-10-CM

## 2021-05-18 HISTORY — DX: Osteomyelitis of vertebra, sacral and sacrococcygeal region: M46.28

## 2021-05-18 NOTE — Progress Notes (Signed)
Reason for infectious disease consult: Osteomyelitis of the sacrum due to decubitus ulcer  Requesting physician: Dionne Milo, MD  Subjective:    Patient ID: Nicole [name], female    DOB: Aug 17, 1951, 70 y.o.   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 Luciana Axe and Marcos Eke for lumbar diskitis in July and August of 2021 with 6 weeks of vancomycin and ceftriaxone through PICC line for culture-negative discitis.  Have not seen her since then.  She has been residing at a group home.  Unfortunately she developed a decubitus ulcer and has been followed closely by Dr. Ranelle Oyster from Wound Care. While wound has appeared to be without purulence or overt evidence of infection. He found that it did probe to bone.  An MRI was obtained and showed: Evidence of osteomyelitis involving S4 and S5 segments with enhancement the dura and distal sacral nerve roots below S3 which the radiologist suggest might be consistent with sacral meningitis.  The patient does not have meningitis and is very stable.  Dr. Julious Oka did obtain a biopsy from the bone which showed evidence of chronic osteomyelitis cultures from the bone were unrevealing and did not grow any organisms.  She was referred to [place] for further treatment and management the wound.  Emphasized offloading the area through specialized bed which they have and also turning her so she is not applying pressure to that wound.  I asked about and she does not have problems with fecal contamination of the wound.  She is going to be followed very closely by Dr. Julious Oka.  I would favor an empiric regimen of daptomycin and ceftriaxone if it is affordable to her otherwise we will do vancomycin and ceftriaxone as she had done before.  Came to clinic with her guardian and also with her home health aide who has been taking care of her and familiar  with giving IV antibiotics.    Past Medical History:  Diagnosis Date   Abnormal WBC count    low chronic WBC (2-3K)   Allergic rhinitis    Alopecia    Discoid lupus    Hyperlipidemia    Hypertension    Menopause    Moderate mental retardation    Obesity    Osteoporosis    Pulmonary nodules    Sacral osteomyelitis (HCC) 05/18/2021   Seizures (HCC)    focal-being by Dr. Sandria Manly (should not take anti-histamines)    Past Surgical History:  Procedure Laterality Date   BIOPSY  06/04/2020   Procedure: BIOPSY;  Surgeon: Willis Modena, MD;  Location: [facility] ENDOSCOPY;  Service: Endoscopy;;   FLEXIBLE SIGMOIDOSCOPY N/A 06/04/2020   Procedure: Arnell Sieving;  Surgeon: Willis Modena, MD;  Location: [facility] ENDOSCOPY;  Service: Endoscopy;  Laterality: N/A;   IR LUMBAR DISC ASPIRATION W/IMG GUIDE  06/11/2020   VESICOVAGINAL FISTULA CLOSURE W/ TAH      Family History  Problem Relation Age of Onset   Breast cancer Mother       Social History   Socioeconomic History   Marital status: Single    Spouse name: Not on file   Number of children: Not on file   Years of education: Not on file   Highest education level: Not on file  Occupational History   Occupation: Part-time    Employer: DISABLED    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)  Tobacco Use  Smoking status: Never   Smokeless tobacco: Never  Substance and Sexual Activity   Alcohol use: No   Drug use: No   Sexual activity: Never  Other Topics Concern   Not on file  Social History Narrative   [facility] [phone]   Social Determinants of Health   Financial Resource Strain: Not on file  Food Insecurity: Not on file  Transportation Needs: Not on file  Physical Activity: Not on file  Stress: Not on file  Social Connections: Not on file    Allergies  Allergen Reactions   Seasonal Ic [Cholestatin]     Runny nose     Current Outpatient Medications:    Amino Acids-Protein Hydrolys  (FEEDING SUPPLEMENT, PRO-STAT SUGAR FREE 64,) LIQD, Take 30 mLs by mouth 3 (three) times daily., Disp: 887 mL, Rfl: 0   busPIRone (BUSPAR) 15 MG tablet, Take 15 mg by mouth 2 (two) times daily.  , Disp: , Rfl:    carbamazepine (TEGRETOL XR) 100 MG 12 hr tablet, Take 3 tablets (300 mg total) by mouth 2 (two) times daily. Reducing due to low sodium levels recently. (Patient taking differently: Take 300 mg by mouth 2 (two) times daily.), Disp: 180 tablet, Rfl: 5   pantoprazole (PROTONIX) 40 MG tablet, Take 40 mg by mouth daily. , Disp: , Rfl:    potassium chloride SA (KLOR-CON) 20 MEQ tablet, Take 1 tablet by mouth 2 (two) times daily., Disp: , Rfl:    atorvastatin (LIPITOR) 10 MG tablet, Take 10 mg by mouth every Monday, Wednesday, and Friday.  (Patient not taking: Reported on 05/18/2021), Disp: , Rfl:    budesonide (PULMICORT FLEXHALER) 180 MCG/ACT inhaler, Inhale 1 puff into the lungs 2 (two) times daily as needed., Disp: 1 each, Rfl: 0   calcium carbonate (OS-CAL) 600 MG TABS tablet, Take 600 mg by mouth 3 (three) times daily with meals.  (Patient not taking: Reported on 05/18/2021), Disp: , Rfl:    Cholecalciferol (VITAMIN D3) 2000 units TABS, Take 2,000 Units by mouth daily.  (Patient not taking: Reported on 05/18/2021), Disp: , Rfl:    cholestyramine (QUESTRAN) 4 g packet, Take 1 packet (4 g total) by mouth 3 (three) times daily., Disp: 90 packet, Rfl: 0   docusate sodium (COLACE) 100 MG capsule, Take 100 mg by mouth daily., Disp: , Rfl:    enoxaparin (LOVENOX) 80 MG/0.8ML injection, Inject 0.8 mLs (80 mg total) into the skin every 12 (twelve) hours., Disp: 144 mL, Rfl: 0   gabapentin (NEURONTIN) 250 MG/5ML solution, Take by mouth., Disp: , Rfl:    gabapentin (NEURONTIN) 300 MG capsule, Take 2 capsules (600 mg total) by mouth at bedtime., Disp: 60 capsule, Rfl: 0   levETIRAcetam (KEPPRA) 100 MG/ML solution, SMARTSIG:Milliliter(s) By Mouth, Disp: , Rfl:    levETIRAcetam (KEPPRA) 500 MG tablet, Take 1  tablet (500 mg total) by mouth 2 (two) times daily., Disp: 60 tablet, Rfl: 5   LINZESS 72 MCG capsule, Take 72 mcg by mouth every morning., Disp: , Rfl:    megestrol (MEGACE) 40 MG/ML suspension, Take by mouth., Disp: , Rfl:    Multiple Vitamins-Minerals (CERTAVITE/ANTIOXIDANTS) TABS, Take 1 tablet by mouth daily. Advance , daily  (Patient not taking: Reported on 05/18/2021), Disp: , Rfl:    Selenium 200 MCG TBCR, Take 1 tablet by mouth daily. (Patient not taking: Reported on 05/18/2021), Disp: , Rfl:    Wheat Dextrin (BENEFIBER) CHEW, Chew 1 tablet by mouth daily.  (Patient not taking: Reported on 05/18/2021),  Disp: , Rfl:    XARELTO 20 MG TABS tablet, Take 20 mg by mouth daily as needed., Disp: , Rfl:     Review of Systems  Constitutional:  Negative for chills and fever.  HENT:  Negative for congestion and sore throat.   Eyes:  Negative for photophobia.  Respiratory:  Negative for cough, shortness of breath and wheezing.   Cardiovascular:  Negative for chest pain, palpitations and leg swelling.  Gastrointestinal:  Negative for abdominal pain, blood in stool, constipation, diarrhea, nausea and vomiting.  Endocrine: Negative for cold intolerance, heat intolerance and polydipsia.  Genitourinary:  Negative for dysuria, flank pain and hematuria.  Musculoskeletal:  Negative for back pain and myalgias.  Skin:  Positive for wound. Negative for rash.  Neurological:  Negative for dizziness, weakness and headaches.  Hematological:  Does not bruise/bleed easily.  Psychiatric/Behavioral:  Negative for agitation, confusion, decreased concentration, dysphoric mood, hallucinations, self-injury and suicidal ideas.       Objective:   Physical Exam Constitutional:      General: She is not in acute distress.    Appearance: She is not diaphoretic.  HENT:     Head: Normocephalic and atraumatic.     Right Ear: External ear normal.     Left Ear: External ear normal.     Nose: Nose normal.      Mouth/Throat:     Pharynx: No oropharyngeal exudate.  Eyes:     General: No scleral icterus.       Right eye: No discharge.        Left eye: No discharge.     Extraocular Movements: Extraocular movements intact.     Conjunctiva/sclera: Conjunctivae normal.  Cardiovascular:     Rate and Rhythm: Normal rate and regular rhythm.  Pulmonary:     Effort: Pulmonary effort is normal. No respiratory distress.     Breath sounds: No wheezing.  Abdominal:     General: There is no distension.  Musculoskeletal:        General: Normal range of motion.     Cervical back: Normal range of motion and neck supple.  Lymphadenopathy:     Cervical: No cervical adenopathy.  Skin:    General: Skin is warm and dry.     Coloration: Skin is not jaundiced or pale.     Findings: No erythema, lesion or rash.  Neurological:     Mental Status: She is alert and oriented to person, place, and time.     Coordination: Coordination normal.  Psychiatric:        Attention and Perception: Attention normal.        Mood and Affect: Mood normal.        Speech: Speech is delayed.        Behavior: Behavior normal. Behavior is cooperative.        Thought Content: Thought content normal.        Judgment: Judgment normal.     She has left-sided hemiplegia with some contractures including her left upper extremity.  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  She will be followed very closely by Dr. Julious Oka.  We will place  a PICC line and try to give her daptomycin and ceftriaxone for 6 weeks.  If the daptomycin is cost prohibitive we will do vancomycin and ceftriaxone.  Lumbar Diskitis: seems resolved,s he has no pain but never had pain  before  I spent more than with the patient including greater than 50% of time in face to face counseling of the patient personally reviewing radiographs, along with pertinent laboratory microbiological data review of medical records and in coordination of her care.

## 2021-05-18 NOTE — Telephone Encounter (Signed)
PICC placement scheduled 6/20 @ 2pm. Patient will get 1st dose in short stay for daptomycin and ceftriaxone. Patient and caregiver notified of appointment.   Orders faxed to [facility] to run claim with insurance. Per Dr. Daiva Eves, if dapto is cost prohibitive, patient can get vancomycin (1g initially and then dose per pharmacy) and ceftriaxone which will not require visit with short stay as patient has had in past.   Rosanna Randy, RN

## 2021-05-18 NOTE — Telephone Encounter (Signed)
Patient currently resides at [facility]  April Logan Bores is care giver. P: [phone]; she'll coordinate transportation etc.  Facility fax: [phone]  Sister is Channing Mutters P: [phone]  Patient currently uses [facility] 3x/week for wound care. Orders faxed to [facility] for PICC maintenance.

## 2021-05-19 LAB — COMPLETE METABOLIC PANEL WITH GFR
AG Ratio: 1 (calc) (ref 1.0–2.5)
ALT: 7 U/L (ref 6–29)
AST: 11 U/L (ref 10–35)
Albumin: 3.6 g/dL (ref 3.6–5.1)
Alkaline phosphatase (APISO): 71 U/L (ref 37–153)
BUN/Creatinine Ratio: 32 (calc) — ABNORMAL HIGH (ref 6–22)
BUN: 16 mg/dL (ref 7–25)
CO2: 27 mmol/L (ref 20–32)
Calcium: 10.6 mg/dL — ABNORMAL HIGH (ref 8.6–10.4)
Chloride: 105 mmol/L (ref 98–110)
Creat: 0.5 mg/dL — ABNORMAL LOW (ref 0.60–0.93)
GFR, Est African American: 113 mL/min/{1.73_m2} (ref >=60)
GFR, Est Non African American: 97 mL/min/{1.73_m2} (ref >=60)
Globulin: 3.5 g/dL (calc) (ref 1.9–3.7)
Glucose, Bld: 100 mg/dL — ABNORMAL HIGH (ref 65–99)
Potassium: 3.7 mmol/L (ref 3.5–5.3)
Sodium: 141 mmol/L (ref 135–146)
Total Bilirubin: 0.2 mg/dL (ref 0.2–1.2)
Total Protein: 7.1 g/dL (ref 6.1–8.1)

## 2021-05-19 LAB — CBC WITH DIFFERENTIAL/PLATELET
Absolute Monocytes: 458 cells/uL (ref 200–950)
Basophils Absolute: 18 cells/uL (ref 0–200)
Basophils Relative: 0.3 %
Eosinophils Absolute: 67 cells/uL (ref 15–500)
Eosinophils Relative: 1.1 %
HCT: 32.9 % — ABNORMAL LOW (ref 35.0–45.0)
Hemoglobin: 10.5 g/dL — ABNORMAL LOW (ref 11.7–15.5)
Lymphs Abs: 1348 cells/uL (ref 850–3900)
MCH: 27.7 pg (ref 27.0–33.0)
MCHC: 31.9 g/dL — ABNORMAL LOW (ref 32.0–36.0)
MCV: 86.8 fL (ref 80.0–100.0)
MPV: 9.9 fL (ref 7.5–12.5)
Monocytes Relative: 7.5 %
Neutro Abs: 4209 cells/uL (ref 1500–7800)
Neutrophils Relative %: 69 %
Platelets: 475 10*3/uL — ABNORMAL HIGH (ref 140–400)
RBC: 3.79 10*6/uL — ABNORMAL LOW (ref 3.80–5.10)
RDW: 14.7 % (ref 11.0–15.0)
Total Lymphocyte: 22.1 %
WBC: 6.1 10*3/uL (ref 3.8–10.8)

## 2021-05-19 LAB — C-REACTIVE PROTEIN: CRP: 18.3 mg/L — ABNORMAL HIGH (ref <8.0)

## 2021-05-19 LAB — SEDIMENTATION RATE: Sed Rate: 111 mm/h — ABNORMAL HIGH (ref 0–30)

## 2021-05-19 NOTE — Telephone Encounter (Signed)
Mary from [facility] called, patient's copay will be $20 per day. May be able to get copay even lower if nursing can cover the per diem cost.   Sandie Ano, RN

## 2021-05-23 ENCOUNTER — Encounter (HOSPITAL_COMMUNITY)
Admission: RE | Admit: 2021-05-23 | Discharge: 2021-05-23 | Disposition: A | Discharge status: Home / Self Care | Payer: Medicare Other | Source: Ambulatory Visit | Attending: Infectious Disease | Admitting: Infectious Disease

## 2021-05-23 ENCOUNTER — Other Ambulatory Visit: Payer: Self-pay

## 2021-05-23 ENCOUNTER — Ambulatory Visit (HOSPITAL_COMMUNITY)
Admission: RE | Admit: 2021-05-23 | Discharge: 2021-05-23 | Disposition: A | Discharge status: Home / Self Care | Payer: Medicare Other | Source: Ambulatory Visit | Attending: Infectious Disease | Admitting: Infectious Disease

## 2021-05-23 ENCOUNTER — Other Ambulatory Visit: Payer: Self-pay | Admitting: Infectious Disease

## 2021-05-23 DIAGNOSIS — M4628 Osteomyelitis of vertebra, sacral and sacrococcygeal region: Secondary | ICD-10-CM | POA: Insufficient documentation

## 2021-05-23 DIAGNOSIS — M4646 Discitis, unspecified, lumbar region: Secondary | ICD-10-CM

## 2021-05-23 HISTORY — PX: IR US GUIDE VASC ACCESS RIGHT: IMG2390

## 2021-05-23 MED ORDER — HEPARIN SOD (PORK) LOCK FLUSH 100 UNIT/ML IV SOLN
INTRAVENOUS | Status: AC
  Filled 2021-05-23: qty 5

## 2021-05-23 MED ORDER — SODIUM CHLORIDE 0.9 % IV SOLN
2.0000 g | Freq: Once | INTRAVENOUS | Status: AC
  Administered 2021-05-23: 17:00:00 2 g via INTRAVENOUS
  Filled 2021-05-23: qty 20

## 2021-05-23 MED ORDER — LIDOCAINE HCL (PF) 1 % IJ SOLN
INTRAMUSCULAR | Status: AC
  Filled 2021-05-23: qty 30

## 2021-05-23 MED ORDER — SODIUM CHLORIDE 0.9 % IV SOLN
10.0000 mg/kg | Freq: Once | INTRAVENOUS | Status: AC
  Administered 2021-05-23: 16:00:00 750 mg via INTRAVENOUS
  Filled 2021-05-23: qty 15

## 2021-05-23 MED ORDER — HEPARIN SOD (PORK) LOCK FLUSH 100 UNIT/ML IV SOLN
250.0000 [IU] | Freq: Every day | INTRAVENOUS | Status: DC
  Administered 2021-05-23: 17:00:00 250 [IU]

## 2021-05-23 MED ORDER — HEPARIN SOD (PORK) LOCK FLUSH 100 UNIT/ML IV SOLN: 250.0000 [IU] | INTRAVENOUS | Status: DC | PRN

## 2021-05-23 NOTE — Procedures (Signed)
PROCEDURE SUMMARY:  Successful placement of single lumen PICC line to right basilic vein. Length 37cm Tip at lower SVC/RA PICC capped No complications Ready for use  EBL < 5 mL   Myrtle Haller H Braelyn Bordonaro PA-C 05/23/2021, 3:24 PM

## 2021-06-06 ENCOUNTER — Other Ambulatory Visit (HOSPITAL_COMMUNITY): Admission: RE | Admit: 2021-06-06 | Payer: Medicare Other | Source: Skilled Nursing Facility | Admitting: Anesthesiology

## 2021-06-06 ENCOUNTER — Other Ambulatory Visit (HOSPITAL_COMMUNITY)
Admission: RE | Admit: 2021-06-06 | Discharge: 2021-06-06 | Disposition: A | Discharge status: Home / Self Care | Payer: Medicare Other | Source: Skilled Nursing Facility | Attending: Anesthesiology | Admitting: Anesthesiology

## 2021-06-06 DIAGNOSIS — L89153 Pressure ulcer of sacral region, stage 3: Secondary | ICD-10-CM | POA: Diagnosis present

## 2021-06-06 DIAGNOSIS — I82401 Acute embolism and thrombosis of unspecified deep veins of right lower extremity: Secondary | ICD-10-CM | POA: Insufficient documentation

## 2021-06-06 DIAGNOSIS — Z452 Encounter for adjustment and management of vascular access device: Secondary | ICD-10-CM | POA: Insufficient documentation

## 2021-06-06 LAB — SEDIMENTATION RATE: Sed Rate: 110 mm/hr — ABNORMAL HIGH (ref 0–22)

## 2021-06-06 LAB — BASIC METABOLIC PANEL
Anion gap: 7 (ref 5–15)
BUN: 14 mg/dL (ref 8–23)
CO2: 25 mmol/L (ref 22–32)
Calcium: 9 mg/dL (ref 8.9–10.3)
Chloride: 104 mmol/L (ref 98–111)
Creatinine, Ser: 0.4 mg/dL — ABNORMAL LOW (ref 0.44–1.00)
GFR, Estimated: >60 mL/min (ref >60)
Glucose, Bld: 136 mg/dL — ABNORMAL HIGH (ref 70–99)
Potassium: 3.2 mmol/L — ABNORMAL LOW (ref 3.5–5.1)
Sodium: 136 mmol/L (ref 135–145)

## 2021-06-06 LAB — CBC WITH DIFFERENTIAL/PLATELET
Abs Immature Granulocytes: 0.03 10*3/uL (ref 0.00–0.07)
Basophils Absolute: 0 10*3/uL (ref 0.0–0.1)
Basophils Relative: 1 %
Eosinophils Absolute: 0.2 10*3/uL (ref 0.0–0.5)
Eosinophils Relative: 3 %
HCT: 31.2 % — ABNORMAL LOW (ref 36.0–46.0)
Hemoglobin: 9.8 g/dL — ABNORMAL LOW (ref 12.0–15.0)
Immature Granulocytes: 1 %
Lymphocytes Relative: 9 %
Lymphs Abs: 0.6 10*3/uL — ABNORMAL LOW (ref 0.7–4.0)
MCH: 28.7 pg (ref 26.0–34.0)
MCHC: 31.4 g/dL (ref 30.0–36.0)
MCV: 91.5 fL (ref 80.0–100.0)
Monocytes Absolute: 0.6 10*3/uL (ref 0.1–1.0)
Monocytes Relative: 9 %
Neutro Abs: 4.9 10*3/uL (ref 1.7–7.7)
Neutrophils Relative %: 77 %
Platelets: 254 10*3/uL (ref 150–400)
RBC: 3.41 MIL/uL — ABNORMAL LOW (ref 3.87–5.11)
RDW: 15.7 % — ABNORMAL HIGH (ref 11.5–15.5)
WBC: 6.3 10*3/uL (ref 4.0–10.5)
nRBC: 0 % (ref 0.0–0.2)

## 2021-06-06 LAB — CK: Total CK: 108 U/L (ref 38–234)

## 2021-06-06 LAB — C-REACTIVE PROTEIN: CRP: 10.4 mg/dL — ABNORMAL HIGH (ref <1.0)

## 2021-06-13 ENCOUNTER — Other Ambulatory Visit (HOSPITAL_COMMUNITY)
Admission: RE | Admit: 2021-06-13 | Discharge: 2021-06-13 | Disposition: A | Discharge status: Home / Self Care | Payer: Medicare Other | Source: Other Acute Inpatient Hospital | Attending: Infectious Disease | Admitting: Infectious Disease

## 2021-06-13 DIAGNOSIS — M869 Osteomyelitis, unspecified: Secondary | ICD-10-CM | POA: Insufficient documentation

## 2021-06-13 LAB — CK: Total CK: 83 U/L (ref 38–234)

## 2021-06-13 LAB — CBC
HCT: 30.8 % — ABNORMAL LOW (ref 36.0–46.0)
Hemoglobin: 9.4 g/dL — ABNORMAL LOW (ref 12.0–15.0)
MCH: 28.5 pg (ref 26.0–34.0)
MCHC: 30.5 g/dL (ref 30.0–36.0)
MCV: 93.3 fL (ref 80.0–100.0)
Platelets: 369 10*3/uL (ref 150–400)
RBC: 3.3 MIL/uL — ABNORMAL LOW (ref 3.87–5.11)
RDW: 15.9 % — ABNORMAL HIGH (ref 11.5–15.5)
WBC: 5.9 10*3/uL (ref 4.0–10.5)
nRBC: 0 % (ref 0.0–0.2)

## 2021-06-13 LAB — BASIC METABOLIC PANEL
Anion gap: 7 (ref 5–15)
BUN: 19 mg/dL (ref 8–23)
CO2: 27 mmol/L (ref 22–32)
Calcium: 10.3 mg/dL (ref 8.9–10.3)
Chloride: 107 mmol/L (ref 98–111)
Creatinine, Ser: 0.62 mg/dL (ref 0.44–1.00)
GFR, Estimated: >60 mL/min (ref >60)
Glucose, Bld: 100 mg/dL — ABNORMAL HIGH (ref 70–99)
Potassium: 3.5 mmol/L (ref 3.5–5.1)
Sodium: 141 mmol/L (ref 135–145)

## 2021-06-13 LAB — SEDIMENTATION RATE: Sed Rate: 127 mm/hr — ABNORMAL HIGH (ref 0–22)

## 2021-06-13 LAB — C-REACTIVE PROTEIN: CRP: 6.2 mg/dL — ABNORMAL HIGH (ref <1.0)

## 2021-06-15 ENCOUNTER — Ambulatory Visit (INDEPENDENT_AMBULATORY_CARE_PROVIDER_SITE_OTHER): Payer: Medicare Other | Admitting: Infectious Disease

## 2021-06-15 ENCOUNTER — Encounter: Payer: Self-pay | Admitting: Infectious Disease

## 2021-06-15 ENCOUNTER — Other Ambulatory Visit: Payer: Self-pay

## 2021-06-15 ENCOUNTER — Telehealth: Payer: Self-pay

## 2021-06-15 VITALS — BP 127/81 | HR 101 | Temp 98.5°F

## 2021-06-15 DIAGNOSIS — M48061 Spinal stenosis, lumbar region without neurogenic claudication: Secondary | ICD-10-CM | POA: Diagnosis not present

## 2021-06-15 DIAGNOSIS — M4646 Discitis, unspecified, lumbar region: Secondary | ICD-10-CM

## 2021-06-15 DIAGNOSIS — M4628 Osteomyelitis of vertebra, sacral and sacrococcygeal region: Secondary | ICD-10-CM

## 2021-06-15 DIAGNOSIS — Z66 Do not resuscitate: Secondary | ICD-10-CM | POA: Diagnosis not present

## 2021-06-15 NOTE — Progress Notes (Signed)
Chief complaint: Follow-up for her sacral decubitus ulcer still with some pain at that site  Subjective:    Patient ID: Nicole [name], female    DOB: [birth year], 70 y.o.   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 Luciana Axe and Marcos Eke for lumbar diskitis in July and August of 2021 with 6 weeks of vancomycin and ceftriaxone through PICC line for culture-negative discitis.  She had not been seen her since then.  She has been residing at a group home.  Unfortunately she developed a decubitus ulcer and has been followed closely by Dr. Ranelle Oyster from Wound Care. While wound has appeared to be without purulence or overt evidence of infection. He found that it did probe to bone.  An MRI was obtained and showed: Evidence of osteomyelitis involving S4 and S5 segments with enhancement the dura and distal sacral nerve roots below S3 which the radiologist suggest might be consistent with sacral meningitis.  The patient does not have meningitis and is very stable.  Dr. Julious Oka did obtain a biopsy from the bone which showed evidence of chronic osteomyelitis cultures from the bone were unrevealing and did not grow any organisms.  She was referred to [place] for further treatment and management the wound.  Emphasized offloading the area through specialized bed which they have and also turning her so she is not applying pressure to that wound.  I asked about and she does not have problems with fecal contamination of the wound.  We initiated a 6-week course with daptomycin and ceftriaxone.  She seems to be tolerating these medications without difficulty.  She is seen Dr. Julious Oka with wound care and he is happy with how she has had improvement in the appearance of her wound.  She did not want me to look at the wound today in clinic.  The PICC line however at least looks to be  clean.  Her daughter accompanied her says that she seems in much better spirits after having been on antibiotics.     Past Medical History:  Diagnosis Date   Abnormal WBC count    low chronic WBC (2-3K)   Allergic rhinitis    Alopecia    Discoid lupus    Hyperlipidemia    Hypertension    Menopause    Moderate mental retardation    Obesity    Osteoporosis    Pulmonary nodules    Sacral osteomyelitis (HCC) 05/18/2021   Seizures (HCC)    focal-being by Dr. Sandria Manly (should not take anti-histamines)    Past Surgical History:  Procedure Laterality Date   BIOPSY  06/04/2020   Procedure: BIOPSY;  Surgeon: Willis Modena, MD;  Location: [facility] ENDOSCOPY;  Service: Endoscopy;;   FLEXIBLE SIGMOIDOSCOPY N/A 06/04/2020   Procedure: Arnell Sieving;  Surgeon: Willis Modena, MD;  Location: [facility] ENDOSCOPY;  Service: Endoscopy;  Laterality: N/A;   IR LUMBAR DISC ASPIRATION W/IMG GUIDE  06/11/2020   IR US GUIDE VASC ACCESS RIGHT  05/23/2021   VESICOVAGINAL FISTULA CLOSURE W/ TAH      Family History  Problem Relation Age of Onset   Breast cancer Mother       Social History   Socioeconomic History   Marital status: Single    Spouse name: Not on file   Number of children: Not on file   Years of education: Not on file   Highest education  level: Not on file  Occupational History   Occupation: Part-time    Employer: DISABLED    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)  Tobacco Use   Smoking status: Never   Smokeless tobacco: Never  Substance and Sexual Activity   Alcohol use: No   Drug use: No   Sexual activity: Never  Other Topics Concern   Not on file  Social History Narrative   [facility] [phone]   Social Determinants of Health   Financial Resource Strain: Not on file  Food Insecurity: Not on file  Transportation Needs: Not on file  Physical Activity: Not on file  Stress: Not on file  Social Connections: Not on file    Allergies   Allergen Reactions   Seasonal Ic [Cholestatin]     Runny nose     Current Outpatient Medications:    Amino Acids-Protein Hydrolys (FEEDING SUPPLEMENT, PRO-STAT SUGAR FREE 64,) LIQD, Take 30 mLs by mouth 3 (three) times daily., Disp: 887 mL, Rfl: 0   atorvastatin (LIPITOR) 10 MG tablet, Take 10 mg by mouth every Monday, Wednesday, and Friday.  (Patient not taking: Reported on 05/18/2021), Disp: , Rfl:    budesonide (PULMICORT FLEXHALER) 180 MCG/ACT inhaler, Inhale 1 puff into the lungs 2 (two) times daily as needed., Disp: 1 each, Rfl: 0   busPIRone (BUSPAR) 15 MG tablet, Take 15 mg by mouth 2 (two) times daily.  , Disp: , Rfl:    calcium carbonate (OS-CAL) 600 MG TABS tablet, Take 600 mg by mouth 3 (three) times daily with meals.  (Patient not taking: Reported on 05/18/2021), Disp: , Rfl:    carbamazepine (TEGRETOL XR) 100 MG 12 hr tablet, Take 3 tablets (300 mg total) by mouth 2 (two) times daily. Reducing due to low sodium levels recently. (Patient taking differently: Take 300 mg by mouth 2 (two) times daily.), Disp: 180 tablet, Rfl: 5   Cholecalciferol (VITAMIN D3) 2000 units TABS, Take 2,000 Units by mouth daily.  (Patient not taking: Reported on 05/18/2021), Disp: , Rfl:    cholestyramine (QUESTRAN) 4 g packet, Take 1 packet (4 g total) by mouth 3 (three) times daily., Disp: 90 packet, Rfl: 0   docusate sodium (COLACE) 100 MG capsule, Take 100 mg by mouth daily., Disp: , Rfl:    enoxaparin (LOVENOX) 80 MG/0.8ML injection, Inject 0.8 mLs (80 mg total) into the skin every 12 (twelve) hours., Disp: 144 mL, Rfl: 0   gabapentin (NEURONTIN) 250 MG/5ML solution, Take by mouth., Disp: , Rfl:    gabapentin (NEURONTIN) 300 MG capsule, Take 2 capsules (600 mg total) by mouth at bedtime., Disp: 60 capsule, Rfl: 0   levETIRAcetam (KEPPRA) 100 MG/ML solution, SMARTSIG:Milliliter(s) By Mouth, Disp: , Rfl:    levETIRAcetam (KEPPRA) 500 MG tablet, Take 1 tablet (500 mg total) by mouth 2 (two) times daily.,  Disp: 60 tablet, Rfl: 5   LINZESS 72 MCG capsule, Take 72 mcg by mouth every morning., Disp: , Rfl:    megestrol (MEGACE) 40 MG/ML suspension, Take by mouth., Disp: , Rfl:    Multiple Vitamins-Minerals (CERTAVITE/ANTIOXIDANTS) TABS, Take 1 tablet by mouth daily. Advance , daily  (Patient not taking: Reported on 05/18/2021), Disp: , Rfl:    pantoprazole (PROTONIX) 40 MG tablet, Take 40 mg by mouth daily. , Disp: , Rfl:    potassium chloride SA (KLOR-CON) 20 MEQ tablet, Take 1 tablet by mouth 2 (two) times daily., Disp: , Rfl:    Selenium 200 MCG TBCR, Take 1 tablet  by mouth daily. (Patient not taking: Reported on 05/18/2021), Disp: , Rfl:    Wheat Dextrin (BENEFIBER) CHEW, Chew 1 tablet by mouth daily.  (Patient not taking: Reported on 05/18/2021), Disp: , Rfl:    XARELTO 20 MG TABS tablet, Take 20 mg by mouth daily as needed., Disp: , Rfl:     Review of Systems  Constitutional:  Negative for chills and fever.  HENT:  Negative for congestion, ear discharge and sore throat.   Eyes:  Negative for photophobia.  Respiratory:  Negative for cough, shortness of breath and wheezing.   Cardiovascular:  Negative for chest pain, palpitations and leg swelling.  Gastrointestinal:  Negative for abdominal pain, blood in stool, constipation, diarrhea, nausea and vomiting.  Endocrine: Negative for cold intolerance, heat intolerance and polydipsia.  Genitourinary:  Negative for dysuria, flank pain and hematuria.  Musculoskeletal:  Negative for back pain and myalgias.  Skin:  Positive for wound. Negative for rash.  Neurological:  Negative for dizziness, weakness and headaches.  Hematological:  Does not bruise/bleed easily.  Psychiatric/Behavioral:  Negative for agitation, confusion, decreased concentration, dysphoric mood, hallucinations, self-injury and suicidal ideas.       Objective:   Physical Exam Constitutional:      General: She is not in acute distress.    Appearance: She is not diaphoretic.  HENT:      Head: Normocephalic and atraumatic.     Right Ear: External ear normal.     Left Ear: External ear normal.     Nose: Nose normal.     Mouth/Throat:     Pharynx: No oropharyngeal exudate.  Eyes:     General: No scleral icterus.       Right eye: No discharge.        Left eye: No discharge.     Extraocular Movements: Extraocular movements intact.     Conjunctiva/sclera: Conjunctivae normal.  Cardiovascular:     Rate and Rhythm: Normal rate and regular rhythm.  Pulmonary:     Effort: Pulmonary effort is normal. No respiratory distress.     Breath sounds: No wheezing.  Abdominal:     General: There is no distension.  Musculoskeletal:        General: Normal range of motion.     Cervical back: Normal range of motion and neck supple.  Lymphadenopathy:     Cervical: No cervical adenopathy.  Skin:    General: Skin is warm and dry.     Coloration: Skin is not jaundiced or pale.     Findings: No erythema, lesion or rash.  Neurological:     Mental Status: She is alert and oriented to person, place, and time.     Coordination: Coordination normal.  Psychiatric:        Attention and Perception: Attention normal.        Mood and Affect: Mood is depressed.        Speech: Speech is delayed.        Behavior: Behavior normal. Behavior is cooperative.        Thought Content: Thought content normal.        Judgment: Judgment normal.     She has left-sided hemiplegia with some contractures including her left upper extremity.  I did not examine the decubitus ulcer wound (patient refused)  PICC line 06/15/2021:        Assessment & Plan:   Sacral osteomyelitis due to decubitus ulcer:  Complete 6 weeks of IV antibiotics and DC picc line, followup with Dr.  Lilly and with me after she completes her IV abx and has been off of them for several weeks  Surgery including Plastics had been mentioned to her by Dr Julious OkaLilly but they were not interested in this  Lumbar Diskitis: seems resolved,s  he has no pain but never had pain before  Goals of care: is DNR and on hospice.

## 2021-06-15 NOTE — Telephone Encounter (Signed)
Spoke with Corrie Dandy at [facility] and relayed verbal orders per Dr. Daiva Eves that okay to pull PICC after last dose. Orders repeated and verified.    Sandie Ano, RN

## 2021-06-20 ENCOUNTER — Encounter (HOSPITAL_COMMUNITY)
Admission: RE | Admit: 2021-06-20 | Discharge: 2021-06-20 | Disposition: A | Discharge status: Home / Self Care | Payer: Medicare Other | Source: Ambulatory Visit | Attending: Infectious Disease | Admitting: Infectious Disease

## 2021-06-20 DIAGNOSIS — Z22322 Carrier or suspected carrier of Methicillin resistant Staphylococcus aureus: Secondary | ICD-10-CM | POA: Diagnosis present

## 2021-06-20 DIAGNOSIS — D638 Anemia in other chronic diseases classified elsewhere: Secondary | ICD-10-CM | POA: Diagnosis present

## 2021-06-20 DIAGNOSIS — Z452 Encounter for adjustment and management of vascular access device: Secondary | ICD-10-CM | POA: Diagnosis present

## 2021-06-20 LAB — CBC WITH DIFFERENTIAL/PLATELET
Abs Immature Granulocytes: 0.01 10*3/uL (ref 0.00–0.07)
Basophils Absolute: 0 10*3/uL (ref 0.0–0.1)
Basophils Relative: 1 %
Eosinophils Absolute: 0.2 10*3/uL (ref 0.0–0.5)
Eosinophils Relative: 4 %
HCT: 28.9 % — ABNORMAL LOW (ref 36.0–46.0)
Hemoglobin: 9 g/dL — ABNORMAL LOW (ref 12.0–15.0)
Immature Granulocytes: 0 %
Lymphocytes Relative: 27 %
Lymphs Abs: 1.1 10*3/uL (ref 0.7–4.0)
MCH: 28.8 pg (ref 26.0–34.0)
MCHC: 31.1 g/dL (ref 30.0–36.0)
MCV: 92.3 fL (ref 80.0–100.0)
Monocytes Absolute: 0.5 10*3/uL (ref 0.1–1.0)
Monocytes Relative: 13 %
Neutro Abs: 2.3 10*3/uL (ref 1.7–7.7)
Neutrophils Relative %: 55 %
Platelets: 386 10*3/uL (ref 150–400)
RBC: 3.13 MIL/uL — ABNORMAL LOW (ref 3.87–5.11)
RDW: 16.4 % — ABNORMAL HIGH (ref 11.5–15.5)
WBC: 4.1 10*3/uL (ref 4.0–10.5)
nRBC: 0 % (ref 0.0–0.2)

## 2021-06-20 LAB — CK: Total CK: 129 U/L (ref 38–234)

## 2021-06-20 LAB — C-REACTIVE PROTEIN: CRP: 1.9 mg/dL — ABNORMAL HIGH (ref <1.0)

## 2021-06-20 LAB — BASIC METABOLIC PANEL
Anion gap: 5 (ref 5–15)
BUN: 20 mg/dL (ref 8–23)
CO2: 29 mmol/L (ref 22–32)
Calcium: 9.9 mg/dL (ref 8.9–10.3)
Chloride: 106 mmol/L (ref 98–111)
Creatinine, Ser: 0.56 mg/dL (ref 0.44–1.00)
GFR, Estimated: >60 mL/min (ref >60)
Glucose, Bld: 83 mg/dL (ref 70–99)
Potassium: 3.6 mmol/L (ref 3.5–5.1)
Sodium: 140 mmol/L (ref 135–145)

## 2021-06-20 LAB — SEDIMENTATION RATE: Sed Rate: 110 mm/hr — ABNORMAL HIGH (ref 0–22)

## 2021-06-23 IMAGING — XA IR DISC ASPIRATION W/IMAG GUIDE
6 series · 16 of 24 positions shown · non-contrast
Comparison: none

INDICATION: 68-year-old female currently admitted with ischemic colitis. MRI of
the lumbar spine show findings concerning for L3-4
discitis/osteomyelitis. She is currently on flagyl and
ciprofloxacin. She comes to our service for a fluoroscopy guided
disc aspiration.

[Series 1: single · 1 of 2 slices shown]
[im 1/2]
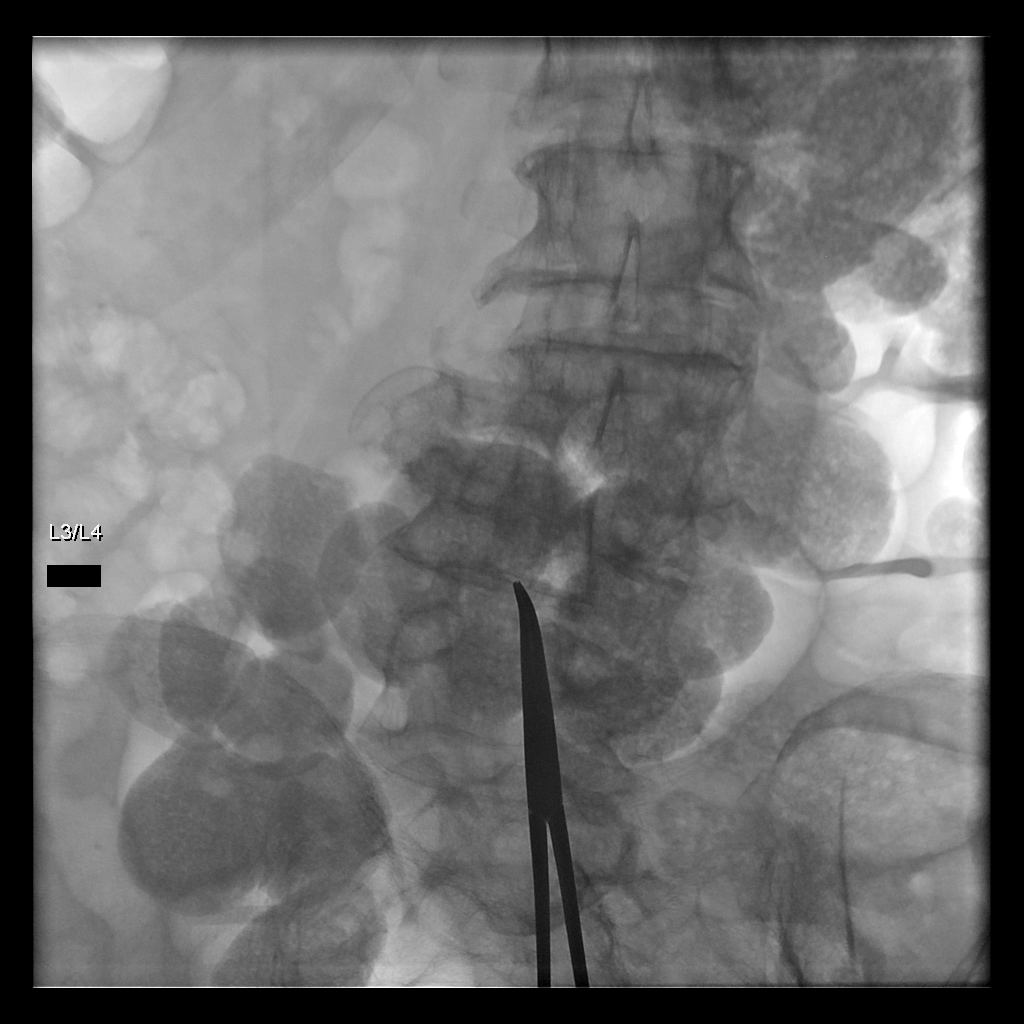

[Series 2: fl angio · 2 of 79 frames shown (1 of 5)]
[frame 12/79]
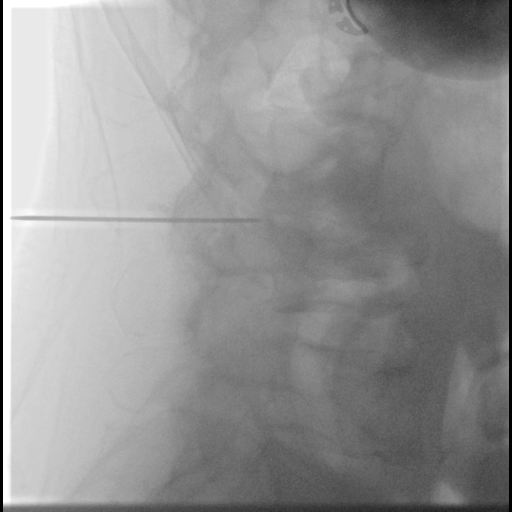
[frame 58/79]
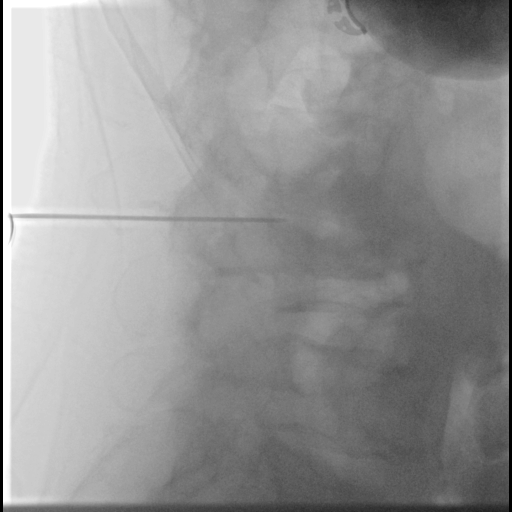

[Series 3: fl angio · 2 of 6 frames shown (2 of 5)]
[frame 1/6]
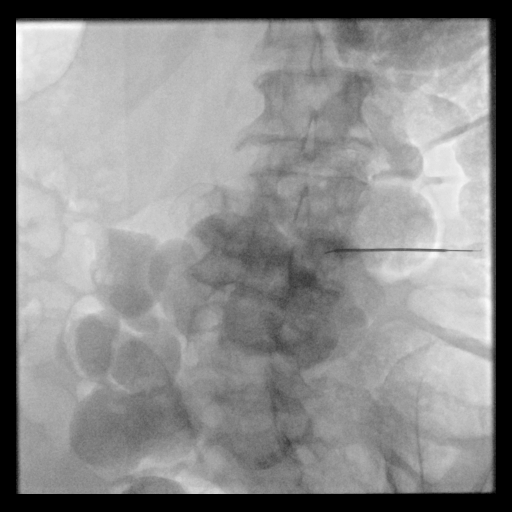
[frame 2/6]
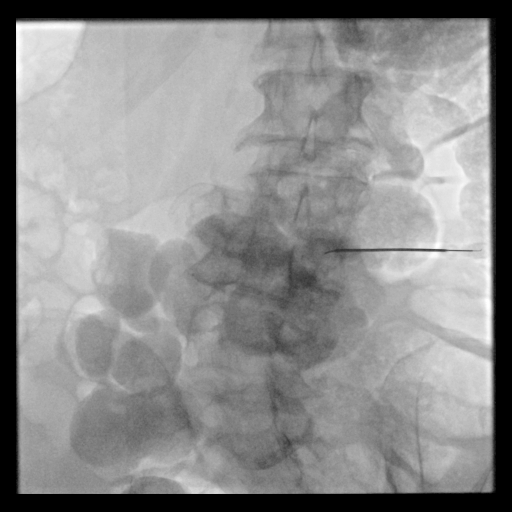

[Series 5: fl angio · 2 acquisitions, 5 frames shown (3 of 5)]
[im 1/2]
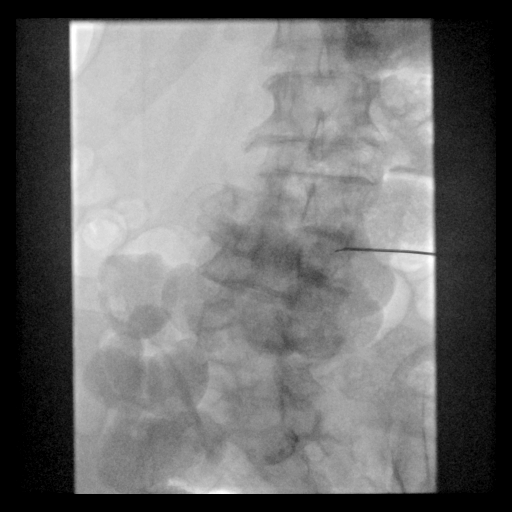
[im 1/2]
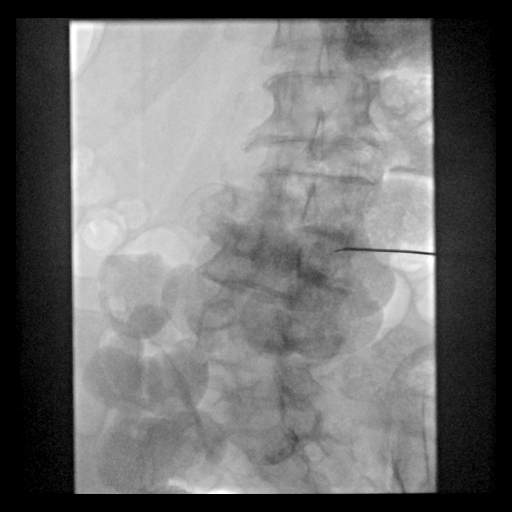
[im 1/2]
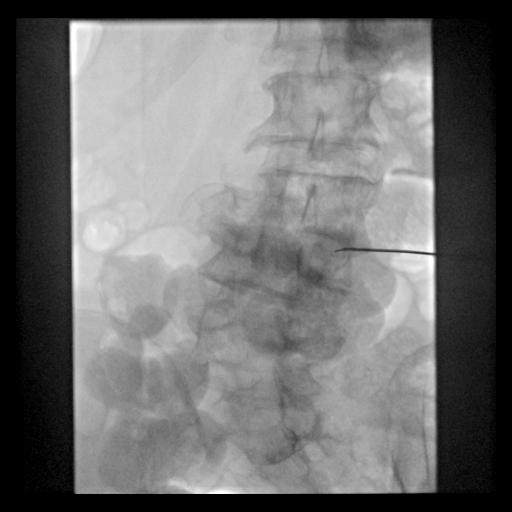
[im 2/2]
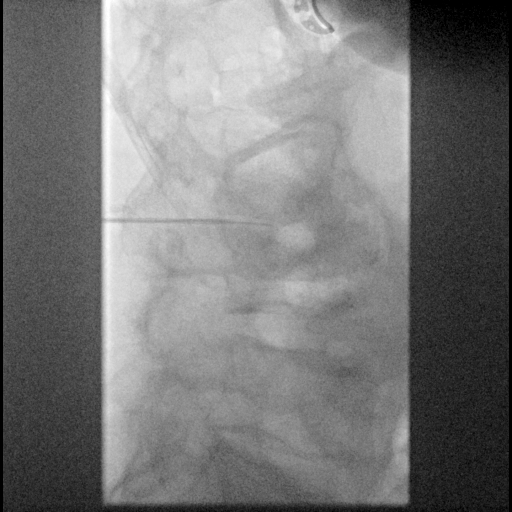
[im 2/2]
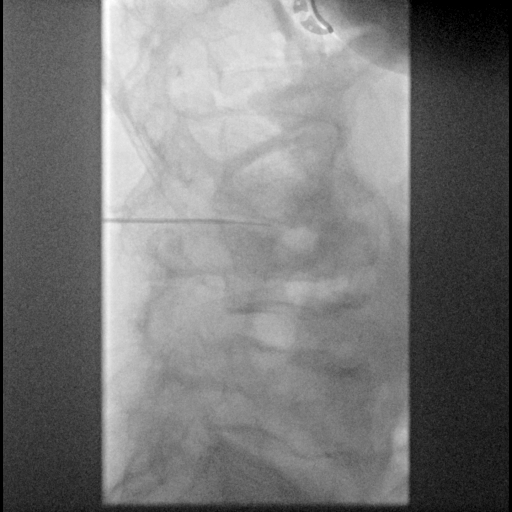

[Series 6: fl angio · 2 acquisitions, 4 frames shown (4 of 5)]
[im 1/2]
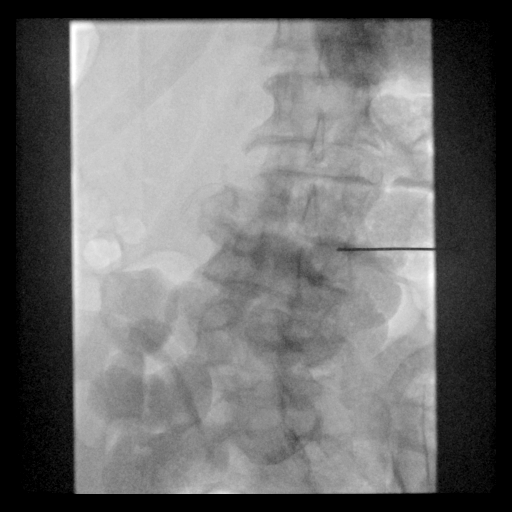
[im 1/2]
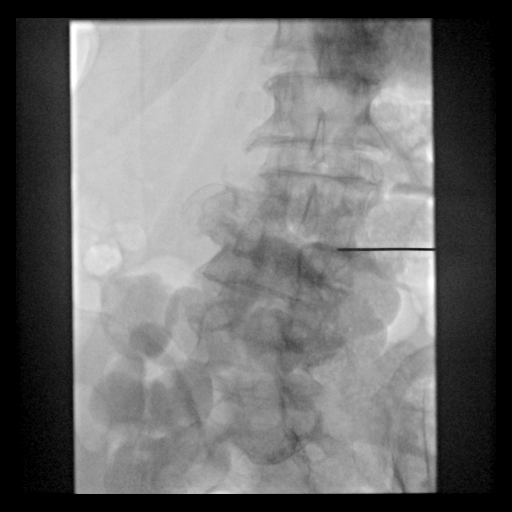
[im 2/2]
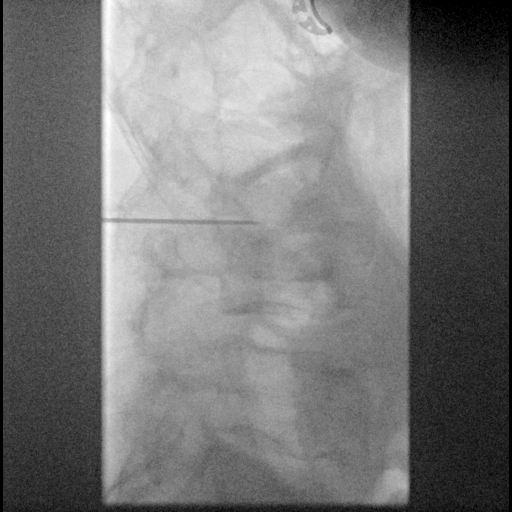
[im 2/2]
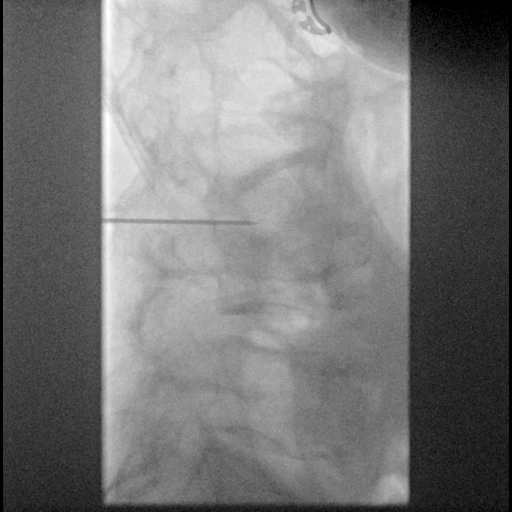

[Series 7: fl angio · 2 of 27 frames shown (5 of 5)]
[frame 5/27]
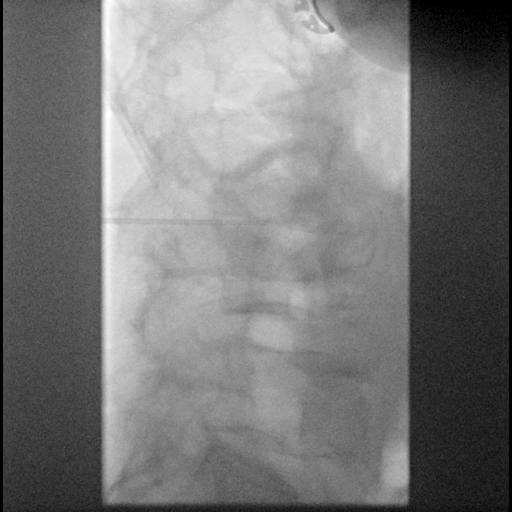
[frame 23/27]
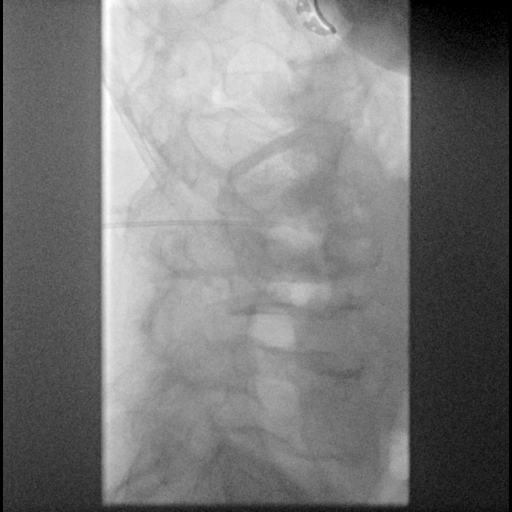

[16 of 24 positions shown; findings below may reference images not displayed]

EXAM:
FLUOROSCOPY GUIDED L3-4 DISC ASPIRATION

MEDICATIONS:
The patient is currently admitted to the hospital and receiving
intravenous antibiotics. The antibiotics were administered within an
appropriate time frame prior to the initiation of the procedure.

ANESTHESIA/SEDATION:
Fentanyl 50 mcg IV; Versed 1.5 mg IV

Moderate Sedation Time:  23

The patient was continuously monitored during the procedure by the
interventional radiology nurse under my direct supervision.

COMPLICATIONS:
None immediate.

PROCEDURE:
Informed written consent was obtained from the patient's healthcare
proxy after a thorough discussion of the procedural risks, benefits
and alternatives. All questions were addressed. Maximal Sterile
Barrier Technique was utilized including caps, mask, sterile gowns,
sterile gloves, sterile drape, hand hygiene and skin antiseptic. A
timeout was performed prior to the initiation of the procedure.

Patient was made to lie prone on the angiography table. The lumbar
region was prepped and draped in the usual sterile fashion. Under
fluoroscopy, the L3-4 disc space was identified. Approximately 4 cm
to the right of midline, an 18 gauge x 20 cm trocar was advanced
into the disc space under fluoroscopy guidance. The meningioma was
removed and a 22 gauge x 20 cm needle was advanced into the disc.
Multiple fine needle aspiration samples were obtained. The trocar
was subsequently removed. The skin was cleaned and and a sterile
bandage was placed over the access site.

The patient tolerated the procedure well without incident or
complication.

Samples collected were sent to the laboratory for microbiology
analysis.
IMPRESSION: Successful and uncomplicated fluoroscopy guided L3-4 disc
aspiration. Memory

## 2021-07-04 ENCOUNTER — Other Ambulatory Visit (HOSPITAL_COMMUNITY)
Admission: RE | Admit: 2021-07-04 | Discharge: 2021-07-04 | Disposition: A | Discharge status: Home / Self Care | Payer: Medicare Other | Source: Other Acute Inpatient Hospital | Attending: Infectious Disease | Admitting: Infectious Disease

## 2021-07-04 DIAGNOSIS — L89153 Pressure ulcer of sacral region, stage 3: Secondary | ICD-10-CM | POA: Insufficient documentation

## 2021-07-04 LAB — BASIC METABOLIC PANEL
Anion gap: 6 (ref 5–15)
BUN: 21 mg/dL (ref 8–23)
CO2: 24 mmol/L (ref 22–32)
Calcium: 9.6 mg/dL (ref 8.9–10.3)
Chloride: 109 mmol/L (ref 98–111)
Creatinine, Ser: 0.67 mg/dL (ref 0.44–1.00)
GFR, Estimated: >60 mL/min (ref >60)
Glucose, Bld: 128 mg/dL — ABNORMAL HIGH (ref 70–99)
Potassium: 3.2 mmol/L — ABNORMAL LOW (ref 3.5–5.1)
Sodium: 139 mmol/L (ref 135–145)

## 2021-07-04 LAB — CBC WITH DIFFERENTIAL/PLATELET
Abs Immature Granulocytes: 0.02 10*3/uL (ref 0.00–0.07)
Basophils Absolute: 0 10*3/uL (ref 0.0–0.1)
Basophils Relative: 1 %
Eosinophils Absolute: 0.3 10*3/uL (ref 0.0–0.5)
Eosinophils Relative: 5 %
HCT: 26.5 % — ABNORMAL LOW (ref 36.0–46.0)
Hemoglobin: 8.4 g/dL — ABNORMAL LOW (ref 12.0–15.0)
Immature Granulocytes: 0 %
Lymphocytes Relative: 16 %
Lymphs Abs: 0.9 10*3/uL (ref 0.7–4.0)
MCH: 29.3 pg (ref 26.0–34.0)
MCHC: 31.7 g/dL (ref 30.0–36.0)
MCV: 92.3 fL (ref 80.0–100.0)
Monocytes Absolute: 0.5 10*3/uL (ref 0.1–1.0)
Monocytes Relative: 9 %
Neutro Abs: 3.7 10*3/uL (ref 1.7–7.7)
Neutrophils Relative %: 69 %
Platelets: 298 10*3/uL (ref 150–400)
RBC: 2.87 MIL/uL — ABNORMAL LOW (ref 3.87–5.11)
RDW: 17.8 % — ABNORMAL HIGH (ref 11.5–15.5)
WBC: 5.4 10*3/uL (ref 4.0–10.5)
nRBC: 0 % (ref 0.0–0.2)

## 2021-07-04 LAB — CK: Total CK: 97 U/L (ref 38–234)

## 2021-07-04 LAB — SEDIMENTATION RATE: Sed Rate: 110 mm/hr — ABNORMAL HIGH (ref 0–22)

## 2021-07-04 LAB — C-REACTIVE PROTEIN: CRP: 1.6 mg/dL — ABNORMAL HIGH (ref <1.0)

## 2021-07-05 ENCOUNTER — Telehealth: Payer: Self-pay

## 2021-07-05 NOTE — Telephone Encounter (Signed)
RN relayed verbal order per Dr. Daiva Eves to home health nurse Joni Reining that okay to pull PICC line.   Sandie Ano, RN

## 2021-07-05 NOTE — Telephone Encounter (Signed)
Nurse from [facility] called for pull PICC orders. She wants to make sure provider was able to review labs from yesterday before she pulls PICC.   Sandie Ano, RN

## 2021-07-20 ENCOUNTER — Ambulatory Visit: Payer: Medicare Other | Admitting: Infectious Disease

## 2021-07-25 ENCOUNTER — Encounter: Payer: Self-pay | Admitting: Infectious Disease

## 2021-07-25 ENCOUNTER — Other Ambulatory Visit: Payer: Self-pay

## 2021-07-25 ENCOUNTER — Ambulatory Visit (INDEPENDENT_AMBULATORY_CARE_PROVIDER_SITE_OTHER): Payer: Medicare Other | Admitting: Infectious Disease

## 2021-07-25 VITALS — BP 146/80 | HR 69 | Temp 98.2°F

## 2021-07-25 DIAGNOSIS — M4628 Osteomyelitis of vertebra, sacral and sacrococcygeal region: Secondary | ICD-10-CM | POA: Diagnosis not present

## 2021-07-25 DIAGNOSIS — Z22322 Carrier or suspected carrier of Methicillin resistant Staphylococcus aureus: Secondary | ICD-10-CM | POA: Diagnosis not present

## 2021-07-25 DIAGNOSIS — I6930 Unspecified sequelae of cerebral infarction: Secondary | ICD-10-CM

## 2021-07-25 DIAGNOSIS — M4646 Discitis, unspecified, lumbar region: Secondary | ICD-10-CM

## 2021-07-25 DIAGNOSIS — G8190 Hemiplegia, unspecified affecting unspecified side: Secondary | ICD-10-CM

## 2021-07-25 DIAGNOSIS — I69354 Hemiplegia and hemiparesis following cerebral infarction affecting left non-dominant side: Secondary | ICD-10-CM | POA: Diagnosis not present

## 2021-07-25 HISTORY — DX: Hemiplegia, unspecified affecting unspecified side: G81.90

## 2021-07-25 NOTE — Progress Notes (Signed)
Chief complaint follow-up for sacral decubitus ulcer with osteomyelitis  Subjective:    Patient ID: Nicole [name], female    DOB: 18-Mar-1951, 70 y.o.   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 Luciana Axe and Marcos Eke for lumbar diskitis in July and August of 2021 with 6 weeks of vancomycin and ceftriaxone through PICC line for culture-negative discitis.  She had not been seen her since then.  She has been residing at a group home.  Unfortunately she developed a decubitus ulcer and has been followed closely by Dr. Ranelle Oyster from Wound Care. While wound has appeared to be without purulence or overt evidence of infection. He found that it did probe to bone.  An MRI was obtained and showed: Evidence of osteomyelitis involving S4 and S5 segments with enhancement the dura and distal sacral nerve roots below S3 which the radiologist suggest might be consistent with sacral meningitis.  The patient does not have meningitis and is very stable.  Dr. Julious Oka did obtain a biopsy from the bone which showed evidence of chronic osteomyelitis cultures from the bone were unrevealing and did not grow any organisms.  She was referred to [place] for further treatment and management the wound.  Emphasized offloading the area through specialized bed which they have and also turning her so she is not applying pressure to that wound.  I asked about and she does not have problems with fecal contamination of the wound.  We initiated a 6-week course with daptomycin and ceftriaxone.  She seem to tolerate his medications without difficulty  She is seen Dr. Julious Oka with wound care and he is happy with how she has had improvement in the appearance of her wound.  He has not wanted me to look at the wound since the wound care doctor has been following it very closely.    At the last visit we decided to  complete antibiotics as planned and then observe off treatment.  She did have elevated sed rate in the 100 still but CRP was improved her clinical condition overall seemed improved and I do not see a reason to extend her antibiotics further with risk of C. difficile colitis and other issues.  She is seen wound care again and her wound care physician is happy with the appearance of her wound.  He said there is no evidence of infection at the wound site.    Past Medical History:  Diagnosis Date   Abnormal WBC count    low chronic WBC (2-3K)   Allergic rhinitis    Alopecia    Discoid lupus    Hyperlipidemia    Hypertension    Menopause    Moderate mental retardation    Obesity    Osteoporosis    Pulmonary nodules    Sacral osteomyelitis (HCC) 05/18/2021   Seizures (HCC)    focal-being by Dr. Sandria Manly (should not take anti-histamines)    Past Surgical History:  Procedure Laterality Date   BIOPSY  06/04/2020   Procedure: BIOPSY;  Surgeon: Willis Modena, MD;  Location: [facility] ENDOSCOPY;  Service: Endoscopy;;   FLEXIBLE SIGMOIDOSCOPY N/A 06/04/2020   Procedure: Arnell Sieving;  Surgeon: Willis Modena, MD;  Location: [facility] ENDOSCOPY;  Service: Endoscopy;  Laterality: N/A;   IR LUMBAR DISC ASPIRATION W/IMG GUIDE  06/11/2020   IR US GUIDE VASC ACCESS RIGHT  05/23/2021   VESICOVAGINAL FISTULA CLOSURE  W/ TAH      Family History  Problem Relation Age of Onset   Breast cancer Mother       Social History   Socioeconomic History   Marital status: Single    Spouse name: Not on file   Number of children: Not on file   Years of education: Not on file   Highest education level: Not on file  Occupational History   Occupation: Part-time    Employer: DISABLED    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)  Tobacco Use   Smoking status: Never   Smokeless tobacco: Never  Substance and Sexual Activity   Alcohol use: No   Drug use: No   Sexual activity: Never  Other  Topics Concern   Not on file  Social History Narrative   [facility] [phone]   Social Determinants of Health   Financial Resource Strain: Not on file  Food Insecurity: Not on file  Transportation Needs: Not on file  Physical Activity: Not on file  Stress: Not on file  Social Connections: Not on file    Allergies  Allergen Reactions   Seasonal Ic [Cholestatin]     Runny nose     Current Outpatient Medications:    Amino Acids-Protein Hydrolys (FEEDING SUPPLEMENT, PRO-STAT SUGAR FREE 64,) LIQD, Take 30 mLs by mouth 3 (three) times daily., Disp: 887 mL, Rfl: 0   atorvastatin (LIPITOR) 10 MG tablet, Take 10 mg by mouth every Monday, Wednesday, and Friday., Disp: , Rfl:    busPIRone (BUSPAR) 15 MG tablet, Take 15 mg by mouth 2 (two) times daily.  , Disp: , Rfl:    calcium carbonate (OS-CAL) 600 MG TABS tablet, Take 600 mg by mouth 3 (three) times daily with meals., Disp: , Rfl:    carbamazepine (TEGRETOL XR) 100 MG 12 hr tablet, Take 3 tablets (300 mg total) by mouth 2 (two) times daily. Reducing due to low sodium levels recently. (Patient taking differently: Take 300 mg by mouth 2 (two) times daily.), Disp: 180 tablet, Rfl: 5   Cholecalciferol (VITAMIN D3) 2000 units TABS, Take 2,000 Units by mouth daily., Disp: , Rfl:    docusate sodium (COLACE) 100 MG capsule, Take 100 mg by mouth daily., Disp: , Rfl:    gabapentin (NEURONTIN) 250 MG/5ML solution, Take by mouth., Disp: , Rfl:    levETIRAcetam (KEPPRA) 100 MG/ML solution, SMARTSIG:Milliliter(s) By Mouth, Disp: , Rfl:    levETIRAcetam (KEPPRA) 500 MG tablet, Take 1 tablet (500 mg total) by mouth 2 (two) times daily., Disp: 60 tablet, Rfl: 5   LINZESS 72 MCG capsule, Take 72 mcg by mouth every morning., Disp: , Rfl:    megestrol (MEGACE) 40 MG/ML suspension, Take by mouth., Disp: , Rfl:    Multiple Vitamins-Minerals (CERTAVITE/ANTIOXIDANTS) TABS, Take 1 tablet by mouth daily. Advance , daily, Disp: , Rfl:    pantoprazole  (PROTONIX) 40 MG tablet, Take 40 mg by mouth daily. , Disp: , Rfl:    potassium chloride SA (KLOR-CON) 20 MEQ tablet, Take 1 tablet by mouth 2 (two) times daily., Disp: , Rfl:    Selenium 200 MCG TBCR, Take 1 tablet by mouth daily., Disp: , Rfl:    Wheat Dextrin (BENEFIBER) CHEW, Chew 1 tablet by mouth daily., Disp: , Rfl:    XARELTO 20 MG TABS tablet, Take 20 mg by mouth daily as needed., Disp: , Rfl:    budesonide (PULMICORT FLEXHALER) 180 MCG/ACT inhaler, Inhale 1 puff into the lungs 2 (two) times  daily as needed., Disp: 1 each, Rfl: 0   cholestyramine (QUESTRAN) 4 g packet, Take 1 packet (4 g total) by mouth 3 (three) times daily., Disp: 90 packet, Rfl: 0   enoxaparin (LOVENOX) 80 MG/0.8ML injection, Inject 0.8 mLs (80 mg total) into the skin every 12 (twelve) hours., Disp: 144 mL, Rfl: 0   gabapentin (NEURONTIN) 300 MG capsule, Take 2 capsules (600 mg total) by mouth at bedtime., Disp: 60 capsule, Rfl: 0    Review of Systems  Constitutional:  Negative for activity change, appetite change, chills, diaphoresis, fatigue, fever and unexpected weight change.  HENT:  Negative for congestion, rhinorrhea, sinus pressure, sneezing, sore throat and trouble swallowing.   Eyes:  Negative for photophobia and visual disturbance.  Respiratory:  Negative for cough, chest tightness, shortness of breath, wheezing and stridor.   Cardiovascular:  Negative for chest pain, palpitations and leg swelling.  Gastrointestinal:  Negative for abdominal distention, abdominal pain, anal bleeding, blood in stool, constipation, diarrhea, nausea and vomiting.  Genitourinary:  Negative for difficulty urinating, dysuria, flank pain and hematuria.  Musculoskeletal:  Negative for arthralgias, back pain, gait problem, joint swelling and myalgias.  Skin:  Negative for color change, pallor, rash and wound.  Neurological:  Positive for weakness. Negative for dizziness, seizures and light-headedness.  Hematological:  Negative for  adenopathy. Does not bruise/bleed easily.  Psychiatric/Behavioral:  Negative for agitation, behavioral problems, confusion, decreased concentration, dysphoric mood and sleep disturbance.       Objective:   Physical Exam Constitutional:      General: She is not in acute distress.    Appearance: Normal appearance. She is well-developed. She is not ill-appearing or diaphoretic.  HENT:     Head: Normocephalic and atraumatic.     Right Ear: Hearing and external ear normal.     Left Ear: Hearing and external ear normal.     Nose: No nasal deformity or rhinorrhea.  Eyes:     General: No scleral icterus.    Conjunctiva/sclera: Conjunctivae normal.     Right eye: Right conjunctiva is not injected.     Left eye: Left conjunctiva is not injected.     Pupils: Pupils are equal, round, and reactive to light.  Neck:     Vascular: No JVD.  Cardiovascular:     Rate and Rhythm: Normal rate and regular rhythm.     Heart sounds: Normal heart sounds, S1 normal and S2 normal.  Pulmonary:     Effort: No respiratory distress.     Breath sounds: No wheezing or rales.  Abdominal:     General: There is no distension.     Palpations: Abdomen is soft.     Tenderness: There is no abdominal tenderness.  Musculoskeletal:        General: Normal range of motion.     Right shoulder: Normal.     Left shoulder: Normal.     Cervical back: Normal range of motion and neck supple.     Right hip: Normal.     Left hip: Normal.     Right knee: Normal.     Left knee: Normal.  Lymphadenopathy:     Head:     Right side of head: No submandibular, preauricular or posterior auricular adenopathy.     Left side of head: No submandibular, preauricular or posterior auricular adenopathy.     Cervical: No cervical adenopathy.     Right cervical: No superficial or deep cervical adenopathy.    Left cervical: No superficial or  deep cervical adenopathy.  Skin:    General: Skin is warm and dry.     Coloration: Skin is not  pale.     Findings: No abrasion, bruising, ecchymosis, erythema, lesion or rash.     Nails: There is no clubbing.  Neurological:     Mental Status: She is alert and oriented to person, place, and time.  Psychiatric:        Attention and Perception: She is attentive.        Mood and Affect: Mood normal.        Speech: Speech normal.        Behavior: Behavior normal. Behavior is cooperative.        Thought Content: Thought content normal.        Judgment: Judgment normal.    Left-sided hemiplegia     Assessment & Plan:   Sacral osteomyelitis underlying stage IV decubitus ulcer:  She has completed 6 weeks of IV antibiotics.  I am checking her sed rate and CRP  I have reviewed Dr. Loetta Rough notes and he is happy with her progress   Lumbar discitis resolved  Hemiplegia: We will continue to have risk for further pressure ulcers but hopefully can continue to have optimization of turning and nutrition and offloading of pressure.

## 2021-07-26 LAB — SEDIMENTATION RATE: Sed Rate: 65 mm/h — ABNORMAL HIGH (ref 0–30)

## 2021-07-26 LAB — C-REACTIVE PROTEIN: CRP: 14 mg/L — ABNORMAL HIGH (ref <8.0)

## 2021-09-20 ENCOUNTER — Ambulatory Visit: Payer: Medicare Other | Admitting: Infectious Disease

## 2022-03-21 ENCOUNTER — Other Ambulatory Visit (HOSPITAL_COMMUNITY)
Admission: RE | Admit: 2022-03-21 | Discharge: 2022-03-21 | Disposition: A | Discharge status: Home / Self Care | Payer: Medicare Other | Source: Skilled Nursing Facility | Attending: Anesthesiology | Admitting: Anesthesiology

## 2022-03-21 DIAGNOSIS — N390 Urinary tract infection, site not specified: Secondary | ICD-10-CM | POA: Insufficient documentation

## 2022-03-21 LAB — URINALYSIS, ROUTINE W REFLEX MICROSCOPIC
Bilirubin Urine: NEGATIVE
Glucose, UA: NEGATIVE mg/dL
Ketones, ur: NEGATIVE mg/dL
Leukocytes,Ua: NEGATIVE
Nitrite: NEGATIVE
Protein, ur: NEGATIVE mg/dL
Specific Gravity, Urine: 1.019 (ref 1.005–1.030)
pH: 5 (ref 5.0–8.0)

## 2022-03-24 LAB — URINE CULTURE: Culture: >=100000 — AB

## 2023-11-27 ENCOUNTER — Encounter (HOSPITAL_BASED_OUTPATIENT_CLINIC_OR_DEPARTMENT_OTHER): Payer: Self-pay | Admitting: Emergency Medicine

## 2023-11-27 ENCOUNTER — Ambulatory Visit (HOSPITAL_COMMUNITY)
Admission: EM | Admit: 2023-11-27 | Discharge: 2023-11-27 | Disposition: A | Discharge status: Home / Self Care | Payer: Medicare Other

## 2023-11-27 ENCOUNTER — Other Ambulatory Visit: Payer: Self-pay

## 2023-11-27 ENCOUNTER — Emergency Department (HOSPITAL_BASED_OUTPATIENT_CLINIC_OR_DEPARTMENT_OTHER)
Admission: EM | Admit: 2023-11-27 | Discharge: 2023-11-27 | Disposition: A | Discharge status: Home / Self Care | Payer: Medicare Other | Attending: Emergency Medicine | Admitting: Emergency Medicine

## 2023-11-27 ENCOUNTER — Encounter (HOSPITAL_COMMUNITY): Payer: Self-pay

## 2023-11-27 DIAGNOSIS — L98429 Non-pressure chronic ulcer of back with unspecified severity: Secondary | ICD-10-CM | POA: Insufficient documentation

## 2023-11-27 DIAGNOSIS — N3001 Acute cystitis with hematuria: Secondary | ICD-10-CM | POA: Diagnosis not present

## 2023-11-27 DIAGNOSIS — Z7901 Long term (current) use of anticoagulants: Secondary | ICD-10-CM | POA: Insufficient documentation

## 2023-11-27 DIAGNOSIS — S31000A Unspecified open wound of lower back and pelvis without penetration into retroperitoneum, initial encounter: Secondary | ICD-10-CM

## 2023-11-27 DIAGNOSIS — I10 Essential (primary) hypertension: Secondary | ICD-10-CM | POA: Diagnosis not present

## 2023-11-27 LAB — BASIC METABOLIC PANEL
Anion gap: 9 (ref 5–15)
BUN: 16 mg/dL (ref 8–23)
CO2: 25 mmol/L (ref 22–32)
Calcium: 9.3 mg/dL (ref 8.9–10.3)
Chloride: 105 mmol/L (ref 98–111)
Creatinine, Ser: 0.8 mg/dL (ref 0.44–1.00)
GFR, Estimated: >60 mL/min (ref >60)
Glucose, Bld: 112 mg/dL — ABNORMAL HIGH (ref 70–99)
Potassium: 3.6 mmol/L (ref 3.5–5.1)
Sodium: 139 mmol/L (ref 135–145)

## 2023-11-27 LAB — CBC
HCT: 37.3 % (ref 36.0–46.0)
Hemoglobin: 12.3 g/dL (ref 12.0–15.0)
MCH: 31.1 pg (ref 26.0–34.0)
MCHC: 33 g/dL (ref 30.0–36.0)
MCV: 94.4 fL (ref 80.0–100.0)
Platelets: 391 10*3/uL (ref 150–400)
RBC: 3.95 MIL/uL (ref 3.87–5.11)
RDW: 12.2 % (ref 11.5–15.5)
WBC: 6.5 10*3/uL (ref 4.0–10.5)
nRBC: 0 % (ref 0.0–0.2)

## 2023-11-27 LAB — URINALYSIS, ROUTINE W REFLEX MICROSCOPIC
Glucose, UA: NEGATIVE mg/dL
Ketones, ur: NEGATIVE mg/dL
Nitrite: POSITIVE — AB
Protein, ur: 100 mg/dL — AB
Specific Gravity, Urine: 1.02 (ref 1.005–1.030)
pH: 8.5 — ABNORMAL HIGH (ref 5.0–8.0)

## 2023-11-27 LAB — URINALYSIS, MICROSCOPIC (REFLEX)

## 2023-11-27 MED ORDER — NITROFURANTOIN MONOHYD MACRO 100 MG PO CAPS
100.0000 mg | ORAL_CAPSULE | Freq: Once | ORAL | Status: AC
  Administered 2023-11-27: 100 mg via ORAL
  Filled 2023-11-27: qty 1

## 2023-11-27 MED ORDER — NITROFURANTOIN MONOHYD MACRO 100 MG PO CAPS: 100.0000 mg | ORAL_CAPSULE | Freq: Two times a day (BID) | ORAL | 0 refills | Status: AC

## 2023-11-27 MED ORDER — NITROFURANTOIN MONOHYD MACRO 100 MG PO CAPS: 100.0000 mg | ORAL_CAPSULE | Freq: Two times a day (BID) | ORAL | 0 refills | Status: DC

## 2023-11-27 NOTE — ED Provider Notes (Signed)
[place] EMERGENCY DEPARTMENT AT [facility] Provider Note   CSN: [id number] Arrival date & time: 11/27/23  1248     History  Chief Complaint  Patient presents with   Hematuria    Nicole [name] is a 72 y.o. female.  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  Patient is here with her granddaughter and caretaker who reports for the last 2 days she has had hematuria.  She denies any clots in her urine.  Patient denies any associated abdominal pain or flank pain, fevers or chills, denies any nausea or vomiting.  She also reports that she has a history of a sacral wound that has been present for the last several years.  She was discharged from wound care about 5 months ago as the wound had healed however over the last few weeks the wound has started to redevelop.  They state that they have been doing daily wound care and are scheduled to see the wound clinic in January.  The history is provided by the patient and a relative. The history is limited by a developmental delay.  Hematuria       Home Medications Prior to Admission medications   Medication Sig Start Date End Date Taking? Authorizing Provider  nitrofurantoin, macrocrystal-monohydrate, (MACROBID) 100 MG capsule Take 1 capsule (100 mg total) by mouth 2 (two) times daily. 11/27/23  Yes Theresia Lo, Turkey K, DO  Amino Acids-Protein Hydrolys (FEEDING SUPPLEMENT, PRO-STAT SUGAR FREE 64,) LIQD Take 30 mLs by mouth 3 (three) times daily. 06/14/20   Darlin Drop, DO  ascorbic acid (VITAMIN C) 1000 MG tablet  08/11/21   [provider]  atorvastatin (LIPITOR) 10 MG tablet Take 10 mg by mouth every Monday, Wednesday, and Friday. 12/08/19   [provider]  budesonide (PULMICORT FLEXHALER) 180 MCG/ACT inhaler Inhale 1 puff into the lungs 2  (two) times daily as needed. 06/14/20 08/13/20  Darlin Drop, DO  busPIRone (BUSPAR) 15 MG tablet Take 15 mg by mouth 2 (two) times daily.      [provider]  calcium carbonate (OS-CAL) 600 MG TABS tablet Take 600 mg by mouth 3 (three) times daily with meals.    [provider]  carbamazepine (TEGRETOL XR) 100 MG 12 hr tablet Take 3 tablets (300 mg total) by mouth 2 (two) times daily. Reducing due to low sodium levels recently. Patient taking differently: Take 300 mg by mouth 2 (two) times daily. 04/10/18   Huston Foley, MD  Cholecalciferol (VITAMIN D3) 2000 units TABS Take 2,000 Units by mouth daily.    [provider]  cholestyramine (QUESTRAN) 4 g packet Take 1 packet (4 g total) by mouth 3 (three) times daily. 06/14/20 07/14/20  Darlin Drop, DO  docusate sodium (COLACE) 100 MG capsule Take 100 mg by mouth daily. 05/05/21   [provider]  enoxaparin (LOVENOX) 80 MG/0.8ML injection Inject 0.8 mLs (80 mg total) into the skin every 12 (twelve) hours. 06/14/20 09/12/20  Darlin Drop, DO  gabapentin (NEURONTIN) 250 MG/5ML solution Take by mouth. 05/05/21   [provider]  gabapentin (NEURONTIN) 300 MG capsule Take 2 capsules (600 mg total) by mouth at bedtime. 06/14/20 07/14/20  Darlin Drop, DO  levETIRAcetam (KEPPRA) 100 MG/ML solution SMARTSIG:Milliliter(s) By Mouth 05/03/21   [provider]  levETIRAcetam (KEPPRA) 500 MG tablet Take 1  tablet (500 mg total) by mouth 2 (two) times daily. 04/10/18   Huston Foley, MD  LINZESS 72 MCG capsule Take 72 mcg by mouth every morning. 03/11/21   [provider]  megestrol (MEGACE) 40 MG/ML suspension Take by mouth. 05/05/21   [provider]  Multiple Vitamins-Minerals (CERTAVITE/ANTIOXIDANTS) TABS Take 1 tablet by mouth daily. Advance , daily    [provider]  pantoprazole (PROTONIX) 40 MG tablet Take 40 mg by mouth daily.  08/04/13   [provider]  potassium chloride SA  (KLOR-CON) 20 MEQ tablet Take 1 tablet by mouth 2 (two) times daily. 08/02/20   [provider]  Selenium 200 MCG TBCR Take 1 tablet by mouth daily.    [provider]  Wheat Dextrin (BENEFIBER) CHEW Chew 1 tablet by mouth daily.    [provider]  XARELTO 20 MG TABS tablet Take 20 mg by mouth daily as needed. 05/05/21   [provider]      Allergies    Seasonal ic [cholestatin]    Review of Systems   Review of Systems  Genitourinary:  Positive for hematuria.    Physical Exam Updated Vital Signs BP (!) 140/60 (BP Location: Right Arm)   Pulse 68   Temp 97.7 F (36.5 C)   Resp 20   Ht 5\' 7"  (1.702 m)   Wt 46.3 kg   SpO2 97%   BMI 15.98 kg/m  Physical Exam Vitals and nursing note reviewed.  Constitutional:      General: She is not in acute distress.    Appearance: Normal appearance.  HENT:     Head: Normocephalic and atraumatic.     Nose: Nose normal.     Mouth/Throat:     Mouth: Mucous membranes are moist.  Eyes:     Extraocular Movements: Extraocular movements intact.     Conjunctiva/sclera: Conjunctivae normal.  Cardiovascular:     Rate and Rhythm: Normal rate and regular rhythm.     Heart sounds: Normal heart sounds.  Pulmonary:     Effort: Pulmonary effort is normal.     Breath sounds: Normal breath sounds.  Abdominal:     General: Abdomen is flat.     Palpations: Abdomen is soft.     Tenderness: There is no abdominal tenderness. There is no right CVA tenderness or left CVA tenderness.  Musculoskeletal:        General: Normal range of motion.     Cervical back: Normal range of motion.  Skin:    General: Skin is warm and dry.  Neurological:     Mental Status: She is alert and oriented to person, place, and time. Mental status is at baseline.     Comments: Contractures of L side, wheelchair bound at baseline     ED Results / Procedures / Treatments   Labs (all labs ordered are listed, but only abnormal results are  displayed) Labs Reviewed  URINALYSIS, ROUTINE W REFLEX MICROSCOPIC - Abnormal; Notable for the following components:      Result Value   APPearance CLOUDY (*)    pH 8.5 (*)    Hgb urine dipstick TRACE (*)    Bilirubin Urine SMALL (*)    Protein, ur 100 (*)    Nitrite POSITIVE (*)    Leukocytes,Ua MODERATE (*)    All other components within normal limits  BASIC METABOLIC PANEL - Abnormal; Notable for the following components:   Glucose, Bld 112 (*)    All other components within  normal limits  URINALYSIS, MICROSCOPIC (REFLEX) - Abnormal; Notable for the following components:   Bacteria, UA MANY (*)    All other components within normal limits  CBC    EKG None  Radiology No results found.  Procedures Procedures    Medications Ordered in ED Medications  nitrofurantoin (macrocrystal-monohydrate) (MACROBID) capsule 100 mg (has no administration in time range)    ED Course/ Medical Decision Making/ A&P Clinical Course as of 11/27/23 1419  Tue Nov 27, 2023  1347 Sacral wound with small deep ulceration, ~1 cm diameter, no surrounding erythema or warmth, no purulent drainage [VK]  1413 UA positive for UTI. Will be started on antibiotics and recommended outpatient follow up. Previous UTI sensitive to ampicillin and macrobid. Will be treated with macrobid. Culture sent.  [VK]    Clinical Course User Index [VK] Rexford Maus, DO                                 Medical Decision Making This patient presents to the ED with chief complaint(s) of hematuria, sacral wound with pertinent past medical history of PE on Xarelto, prior CVA/anoxic brain injury wheelchair bound at baseline, HTN which further complicates the presenting complaint. The complaint involves an extensive differential diagnosis and also carries with it a high risk of complications and morbidity.    The differential diagnosis includes UTI, malignancy, coagulopathy, anemia, no associated pain making  nephrolithiasis less likely, hindering sacral wound/ulcer, cellulitis, abscess  Additional history obtained: Additional history obtained from family Records reviewed Care Everywhere/External Records  ED Course and Reassessment: On patient's arrival she is hemodynamically stable in no acute distress.  Was initially evaluated by triage and had urine and labs ordered.  Patient will be changed into a gown to have her sacral wound assessed.  She has no significant pain making a nephrolithiasis or bladder outlet obstruction unlikely.  Independent labs interpretation:  The following labs were independently interpreted: UA positive for UTI, labs otherwise within normal range  Independent visualization of imaging: - N/A  Consultation: - Consulted or discussed management/test interpretation w/ external professional: N/A  Consideration for admission or further workup: Patient has no emergent conditions requiring admission or further work-up at this time and is stable for discharge home with primary care follow-up  Social Determinants of health: N/A    Amount and/or Complexity of Data Reviewed Labs: ordered.  Risk Prescription drug management.          Final Clinical Impression(s) / ED Diagnoses Final diagnoses:  Sacral wound, initial encounter  Acute cystitis with hematuria    Rx / DC Orders ED Discharge Orders          Ordered    nitrofurantoin, macrocrystal-monohydrate, (MACROBID) 100 MG capsule  2 times daily        11/27/23 1417              Rexford Maus, DO 11/27/23 1419

## 2023-11-27 NOTE — ED Notes (Signed)
 Discharge paperwork reviewed entirely with patient, including follow up care. Pain was under control. The patient received instruction and coaching on their prescriptions, and all follow-up questions were answered.  Pt verbalized understanding as well as all parties involved. No questions or concerns voiced at the time of discharge. No acute distress noted.   Pt was wheeled out to the PVA in a wheelchair without incident.  Pt advised they will seek followup care with a specialist and followup with their PCP.

## 2023-11-27 NOTE — ED Notes (Signed)
Pt's caretaker assisted staff with getting her back into a depends diaper.

## 2023-11-27 NOTE — Discharge Instructions (Signed)
You were seen in the emergency department for your blood in your urine.  You do have a urinary tract infection and I have given you a prescription of antibiotics and you should complete this as prescribed.  We did send a urine culture so if this grows a different bacteria from the antibiotic that we sent you will receive a call to switch her antibiotics.  Your wound today did not look infected and you should follow-up with wound care as scheduled in a few weeks and can follow-up with your primary doctor to have your urinary symptoms rechecked.  You should return to the emergency department if you have fevers despite the antibiotics, develop abdominal or back pain, have repetitive vomiting and cannot tolerate the antibiotics or if you have any other new or concerning symptoms.

## 2023-11-27 NOTE — ED Triage Notes (Signed)
Per care taker, pt is total care and does caths and wears a brief. States has had blood in her urine x2-3 days. States pt has a recurring bed sore to buttocks and has an appt with wound care 1/6.

## 2023-11-27 NOTE — ED Notes (Signed)
Patient is being discharged from the Urgent Care and sent to the Emergency Department via POV . Per Lillia Abed, [place], patient is in need of higher level of care due to further evaluation. Patient is aware and verbalizes understanding of plan of care.  Vitals:   11/27/23 1158  BP: 124/79  Pulse: 93  Resp: 18  Temp: 97.9 F (36.6 C)  SpO2: 99%

## 2023-11-27 NOTE — ED Triage Notes (Signed)
Pt reports hematuria x 2-3 days, pt is on blood thinners, denies fever or other sxs, pt also has hx of sacral pressure ulcers, was released by wound care clinic 6 mo ago, has a new ulcer that is bleeding

## 2023-11-27 NOTE — ED Notes (Signed)
Pt picked up with two personnel and placed in the bed and got her into a gown.

## 2024-10-17 ENCOUNTER — Emergency Department (HOSPITAL_COMMUNITY)

## 2024-10-17 ENCOUNTER — Emergency Department (HOSPITAL_COMMUNITY)
Admission: EM | Admit: 2024-10-17 | Discharge: 2024-10-17 | Disposition: A | Discharge status: Home / Self Care | Source: Ambulatory Visit | Attending: Emergency Medicine | Admitting: Emergency Medicine

## 2024-10-17 DIAGNOSIS — Z79899 Other long term (current) drug therapy: Secondary | ICD-10-CM | POA: Diagnosis not present

## 2024-10-17 DIAGNOSIS — J210 Acute bronchiolitis due to respiratory syncytial virus: Secondary | ICD-10-CM | POA: Diagnosis not present

## 2024-10-17 DIAGNOSIS — R059 Cough, unspecified: Secondary | ICD-10-CM | POA: Diagnosis present

## 2024-10-17 LAB — RESP PANEL BY RT-PCR (RSV, FLU A&B, COVID)  RVPGX2
Influenza A by PCR: NEGATIVE
Influenza B by PCR: NEGATIVE
Resp Syncytial Virus by PCR: POSITIVE — AB
SARS Coronavirus 2 by RT PCR: NEGATIVE

## 2024-10-17 MED ORDER — ONDANSETRON 4 MG PO TBDP: ORAL_TABLET | ORAL | 0 refills | Status: AC

## 2024-10-17 MED ORDER — BENZONATATE 100 MG PO CAPS: 100.0000 mg | ORAL_CAPSULE | Freq: Three times a day (TID) | ORAL | 0 refills | Status: AC

## 2024-10-17 MED ORDER — ONDANSETRON 4 MG PO TBDP: ORAL_TABLET | ORAL | 0 refills | Status: DC

## 2024-10-17 MED ORDER — BENZONATATE 100 MG PO CAPS: 100.0000 mg | ORAL_CAPSULE | Freq: Three times a day (TID) | ORAL | 0 refills | Status: DC

## 2024-10-17 MED ORDER — DOXYCYCLINE HYCLATE 100 MG PO CAPS: 100.0000 mg | ORAL_CAPSULE | Freq: Two times a day (BID) | ORAL | 0 refills | Status: DC

## 2024-10-17 MED ORDER — DOXYCYCLINE HYCLATE 100 MG PO CAPS: 100.0000 mg | ORAL_CAPSULE | Freq: Two times a day (BID) | ORAL | 0 refills | Status: AC

## 2024-10-17 NOTE — ED Provider Notes (Signed)
 [place] EMERGENCY DEPARTMENT AT [facility] Provider Note   CSN: [id number] Arrival date & time: 10/17/24  9074     Patient presents with: Cough   Nicole [name] is a 72 y.o. female.   73 yo F with a chief complaint of cough congestion.  Going on for about a week.  No known sick contacts.  Had an x-ray done at her facility and the report suggested she get a CT scan and so came here for evaluation.   Cough      Prior to Admission medications   Medication Sig Start Date End Date Taking? Authorizing Provider  benzonatate  (TESSALON ) 100 MG capsule Take 1 capsule (100 mg total) by mouth every 8 (eight) hours. 10/17/24  Yes Shavona Gunderman, DO  doxycycline (VIBRAMYCIN) 100 MG capsule Take 1 capsule (100 mg total) by mouth 2 (two) times daily. One po bid x 7 days 10/17/24  Yes Emil Share, DO  ondansetron  (ZOFRAN -ODT) 4 MG disintegrating tablet 4mg  ODT q4 hours prn nausea/vomit 10/17/24  Yes Saraia Platner, DO  Amino Acids -Protein Hydrolys (FEEDING SUPPLEMENT, PRO-STAT SUGAR FREE 64,) LIQD Take 30 mLs by mouth 3 (three) times daily. 06/14/20   Shona Terry SAILOR, DO  ascorbic acid (VITAMIN C) 1000 MG tablet  08/11/21   [provider]  atorvastatin  (LIPITOR) 10 MG tablet Take 10 mg by mouth every Monday, Wednesday, and Friday. 12/08/19   [provider]  budesonide  (PULMICORT  FLEXHALER) 180 MCG/ACT inhaler Inhale 1 puff into the lungs 2 (two) times daily as needed. 06/14/20 08/13/20  Shona Terry SAILOR, DO  busPIRone  (BUSPAR ) 15 MG tablet Take 15 mg by mouth 2 (two) times daily.      [provider]  calcium  carbonate (OS-CAL) 600 MG TABS tablet Take 600 mg by mouth 3 (three) times daily with meals.    [provider]  carbamazepine  (TEGRETOL  XR) 100 MG 12 hr tablet Take 3 tablets (300 mg total) by mouth 2 (two) times daily. Reducing due to low sodium levels recently. Patient taking differently: Take 300 mg by mouth 2 (two) times daily. 04/10/18   Athar, Saima, MD   Cholecalciferol (VITAMIN D3) 2000 units TABS Take 2,000 Units by mouth daily.    [provider]  cholestyramine  (QUESTRAN ) 4 g packet Take 1 packet (4 g total) by mouth 3 (three) times daily. 06/14/20 07/14/20  Shona Terry SAILOR, DO  docusate sodium  (COLACE) 100 MG capsule Take 100 mg by mouth daily. 05/05/21   [provider]  enoxaparin  (LOVENOX ) 80 MG/0.8ML injection Inject 0.8 mLs (80 mg total) into the skin every 12 (twelve) hours. 06/14/20 09/12/20  Shona Terry SAILOR, DO  gabapentin  (NEURONTIN ) 250 MG/5ML solution Take by mouth. 05/05/21   [provider]  gabapentin  (NEURONTIN ) 300 MG capsule Take 2 capsules (600 mg total) by mouth at bedtime. 06/14/20 07/14/20  Shona Terry SAILOR, DO  levETIRAcetam  (KEPPRA ) 100 MG/ML solution SMARTSIG:Milliliter(s) By Mouth 05/03/21   [provider]  levETIRAcetam  (KEPPRA ) 500 MG tablet Take 1 tablet (500 mg total) by mouth 2 (two) times daily. 04/10/18   Buck Saucer, MD  LINZESS 72 MCG capsule Take 72 mcg by mouth every morning. 03/11/21   [provider]  megestrol (MEGACE) 40 MG/ML suspension Take by mouth. 05/05/21   [provider]  Multiple Vitamins-Minerals (CERTAVITE/ANTIOXIDANTS) TABS Take 1 tablet by mouth daily. Advance , daily    [provider]  nitrofurantoin , macrocrystal-monohydrate, (MACROBID ) 100 MG capsule Take 1 capsule (100 mg total) by  mouth 2 (two) times daily. 11/27/23   Kingsley, Victoria K, DO  pantoprazole  (PROTONIX ) 40 MG tablet Take 40 mg by mouth daily.  08/04/13   [provider]  potassium chloride  SA (KLOR-CON ) 20 MEQ tablet Take 1 tablet by mouth 2 (two) times daily. 08/02/20   [provider]  Selenium 200 MCG TBCR Take 1 tablet by mouth daily.    [provider]  Wheat Dextrin (BENEFIBER) CHEW Chew 1 tablet by mouth daily.    [provider]  XARELTO 20 MG TABS tablet Take 20 mg by mouth daily as needed. 05/05/21   [provider]     Allergies: Seasonal ic [cholestatin]    Review of Systems  Respiratory:  Positive for cough.     Updated Vital Signs BP 109/83 (BP Location: Right Arm)   Pulse 74   Temp 98.5 F (36.9 C) (Oral)   Resp (!) 22   SpO2 96%   Physical Exam Vitals and nursing note reviewed.  Constitutional:      General: She is not in acute distress.    Appearance: She is well-developed. She is not diaphoretic.  HENT:     Head: Normocephalic and atraumatic.  Eyes:     Pupils: Pupils are equal, round, and reactive to light.  Cardiovascular:     Rate and Rhythm: Normal rate and regular rhythm.     Heart sounds: No murmur heard.    No friction rub. No gallop.  Pulmonary:     Effort: Pulmonary effort is normal.     Breath sounds: No wheezing or rales.     Comments: Coarse breath sounds in all fields Abdominal:     General: There is no distension.     Palpations: Abdomen is soft.     Tenderness: There is no abdominal tenderness.  Musculoskeletal:        General: No tenderness.     Cervical back: Normal range of motion and neck supple.  Skin:    General: Skin is warm and dry.  Neurological:     Mental Status: She is alert and oriented to person, place, and time.  Psychiatric:        Behavior: Behavior normal.     (all labs ordered are listed, but only abnormal results are displayed) Labs Reviewed  RESP PANEL BY RT-PCR (RSV, FLU A&B, COVID)  RVPGX2 - Abnormal; Notable for the following components:      Result Value   Resp Syncytial Virus by PCR POSITIVE (*)    All other components within normal limits    EKG: None  Radiology: DG Chest 2 View Result Date: 10/17/2024 CLINICAL DATA:  Pneumonia. EXAM: CHEST - 2 VIEW COMPARISON:  06/07/2020 FINDINGS: The heart size and mediastinal contours are within normal limits. Potential subtle opacity in the right lower lung may be consistent with early pneumonia. No pulmonary edema, pleural fluid or pneumothorax. The visualized skeletal  structures are unremarkable. IMPRESSION: Potential subtle opacity in the right lower lung may be consistent with early pneumonia. Electronically Signed   By: Marcey Moan M.D.   On: 10/17/2024 10:32     Procedures   Medications Ordered in the ED - No data to display                                  Medical Decision Making Amount and/or Complexity of Data Reviewed Radiology: ordered.  Risk Prescription drug management.  73 yo F with a chief complaints of cough congestion going on for about a week.  Patient had an x-ray done today.  Her caregiver showed me the report the radiologist had recommended a CT scan be done in the future to further evaluate if necessary.  We had an x-ray done here, subtle opacity likely pneumonia.  She is RSV positive as well.  I do not think she would benefit from an acute CT.  I discussed this with the caregiver as well as the patient.  Encouraged them to discuss this finding with their family doctor and if they chose to obtain CT imaging to assess for resolution in some months they can attempt that. 12:21 PM:  I have discussed the diagnosis/risks/treatment options with the patient and caregiver.  Evaluation and diagnostic testing in the emergency department does not suggest an emergent condition requiring admission or immediate intervention beyond what has been performed at this time.  They will follow up with PCP. We also discussed returning to the ED immediately if new or worsening sx occur. We discussed the sx which are most concerning (e.g., sudden worsening pain, fever, inability to tolerate by mouth, confusion, sob) that necessitate immediate return. Medications administered to the patient during their visit and any new prescriptions provided to the patient are listed below.  Medications given during this visit Medications - No data to display   The patient appears reasonably screen and/or stabilized for discharge and I doubt any other medical  condition or other [facility] requiring further screening, evaluation, or treatment in the ED at this time prior to discharge.        Final diagnoses:  RSV (acute bronchiolitis due to respiratory syncytial virus)    ED Discharge Orders          Ordered    doxycycline (VIBRAMYCIN) 100 MG capsule  2 times daily        10/17/24 1147    benzonatate  (TESSALON ) 100 MG capsule  Every 8 hours        10/17/24 1149    ondansetron  (ZOFRAN -ODT) 4 MG disintegrating tablet        10/17/24 1149               Emil Share, DO 10/17/24 1221

## 2024-10-17 NOTE — ED Triage Notes (Signed)
 [facility] chest XR yesterday - her PCP called this morning and told ehr she had pneumonia. Symptoms started Tuesday of feeling like we had a head cold - cough - some wheezing. Denies CP/SOB.

## 2024-10-17 NOTE — Discharge Instructions (Signed)
 Your test here was positive for RSV.  Your x-ray suggest that you could have early pneumonia.  I have started you on antibiotics for this.  Please follow-up with your family doctor in the office.  I understand that your x-ray that was done at your facility the radiologist commented that you could consider getting a CT scan.  In this instance it was recommended that you get one in the future to make sure that the finding on x-ray had resolved.  Our x-ray here does not show obvious concern other than the pneumonia.  Please discuss this with your doctor.  They could consider to obtain CT imaging in the future.  Take tylenol  2 pills 4 times a day and motrin 4 pills 3 times a day.  Drink plenty of fluids.  Return for worsening shortness of breath, headache, confusion. Follow up with your family doctor.
